# Patient Record
Sex: Female | Born: 1945 | Race: White | Hispanic: No | Marital: Married | Smoking: Former smoker
Health system: Southern US, Community
[De-identification: ages and names within clinical notes are randomized; demographics above are authoritative.]

## PROBLEM LIST (undated history)

## (undated) DIAGNOSIS — E079 Disorder of thyroid, unspecified: Secondary | ICD-10-CM

## (undated) DIAGNOSIS — E785 Hyperlipidemia, unspecified: Secondary | ICD-10-CM

## (undated) DIAGNOSIS — M8580 Other specified disorders of bone density and structure, unspecified site: Secondary | ICD-10-CM

## (undated) DIAGNOSIS — R011 Cardiac murmur, unspecified: Secondary | ICD-10-CM

## (undated) DIAGNOSIS — E039 Hypothyroidism, unspecified: Secondary | ICD-10-CM

## (undated) DIAGNOSIS — E119 Type 2 diabetes mellitus without complications: Secondary | ICD-10-CM

## (undated) DIAGNOSIS — I10 Essential (primary) hypertension: Secondary | ICD-10-CM

## (undated) DIAGNOSIS — E669 Obesity, unspecified: Secondary | ICD-10-CM

## (undated) DIAGNOSIS — I639 Cerebral infarction, unspecified: Secondary | ICD-10-CM

## (undated) DIAGNOSIS — L900 Lichen sclerosus et atrophicus: Secondary | ICD-10-CM

## (undated) HISTORY — DX: Lichen sclerosus et atrophicus: L90.0

## (undated) HISTORY — DX: Cerebral infarction, unspecified: I63.9

## (undated) HISTORY — DX: Disorder of thyroid, unspecified: E07.9

## (undated) HISTORY — DX: Other specified disorders of bone density and structure, unspecified site: M85.80

## (undated) HISTORY — DX: Essential (primary) hypertension: I10

## (undated) HISTORY — DX: Cardiac murmur, unspecified: R01.1

## (undated) HISTORY — PX: ROTATOR CUFF REPAIR: SHX139

## (undated) HISTORY — DX: Type 2 diabetes mellitus without complications: E11.9

## (undated) HISTORY — DX: Obesity, unspecified: E66.9

## (undated) HISTORY — DX: Hyperlipidemia, unspecified: E78.5

## (undated) HISTORY — PX: OTHER SURGICAL HISTORY: SHX169

---

## 2006-02-20 ENCOUNTER — Ambulatory Visit: Payer: Self-pay | Admitting: Unknown Physician Specialty

## 2006-03-07 ENCOUNTER — Other Ambulatory Visit: Payer: Self-pay

## 2006-03-07 ENCOUNTER — Inpatient Hospital Stay: Payer: Self-pay | Admitting: Internal Medicine

## 2006-03-17 ENCOUNTER — Ambulatory Visit: Payer: Self-pay | Admitting: Internal Medicine

## 2006-05-14 ENCOUNTER — Ambulatory Visit: Payer: Self-pay | Admitting: Unknown Physician Specialty

## 2008-10-24 ENCOUNTER — Ambulatory Visit: Payer: Self-pay | Admitting: Family Medicine

## 2010-10-05 LAB — HM PAP SMEAR

## 2010-11-08 ENCOUNTER — Ambulatory Visit: Payer: Self-pay

## 2011-12-31 ENCOUNTER — Ambulatory Visit: Payer: Self-pay | Admitting: Unknown Physician Specialty

## 2012-06-11 ENCOUNTER — Ambulatory Visit: Payer: Self-pay

## 2013-07-22 ENCOUNTER — Ambulatory Visit: Payer: Self-pay

## 2014-03-10 DIAGNOSIS — D649 Anemia, unspecified: Secondary | ICD-10-CM | POA: Insufficient documentation

## 2014-04-01 ENCOUNTER — Ambulatory Visit: Payer: Self-pay | Admitting: Gastroenterology

## 2014-04-01 LAB — HM COLONOSCOPY

## 2014-04-11 ENCOUNTER — Ambulatory Visit: Payer: Self-pay | Admitting: Gastroenterology

## 2014-10-25 ENCOUNTER — Ambulatory Visit: Payer: Self-pay

## 2014-10-25 LAB — HM MAMMOGRAPHY

## 2015-02-23 ENCOUNTER — Encounter: Payer: Self-pay | Admitting: Unknown Physician Specialty

## 2015-02-23 LAB — HM DIABETES EYE EXAM

## 2015-04-03 ENCOUNTER — Encounter: Payer: Self-pay | Admitting: Unknown Physician Specialty

## 2015-04-03 ENCOUNTER — Ambulatory Visit (INDEPENDENT_AMBULATORY_CARE_PROVIDER_SITE_OTHER): Payer: PPO | Admitting: Unknown Physician Specialty

## 2015-04-03 VITALS — BP 127/59 | HR 60 | Temp 97.4°F | Ht 61.6 in | Wt 174.0 lb

## 2015-04-03 DIAGNOSIS — E1165 Type 2 diabetes mellitus with hyperglycemia: Secondary | ICD-10-CM | POA: Diagnosis not present

## 2015-04-03 DIAGNOSIS — E039 Hypothyroidism, unspecified: Secondary | ICD-10-CM | POA: Insufficient documentation

## 2015-04-03 DIAGNOSIS — I639 Cerebral infarction, unspecified: Secondary | ICD-10-CM | POA: Insufficient documentation

## 2015-04-03 DIAGNOSIS — L900 Lichen sclerosus et atrophicus: Secondary | ICD-10-CM | POA: Insufficient documentation

## 2015-04-03 DIAGNOSIS — E119 Type 2 diabetes mellitus without complications: Secondary | ICD-10-CM

## 2015-04-03 DIAGNOSIS — IMO0002 Reserved for concepts with insufficient information to code with codable children: Secondary | ICD-10-CM | POA: Insufficient documentation

## 2015-04-03 DIAGNOSIS — I129 Hypertensive chronic kidney disease with stage 1 through stage 4 chronic kidney disease, or unspecified chronic kidney disease: Secondary | ICD-10-CM | POA: Insufficient documentation

## 2015-04-03 DIAGNOSIS — I10 Essential (primary) hypertension: Secondary | ICD-10-CM | POA: Insufficient documentation

## 2015-04-03 DIAGNOSIS — E538 Deficiency of other specified B group vitamins: Secondary | ICD-10-CM | POA: Insufficient documentation

## 2015-04-03 DIAGNOSIS — E669 Obesity, unspecified: Secondary | ICD-10-CM | POA: Insufficient documentation

## 2015-04-03 DIAGNOSIS — N183 Chronic kidney disease, stage 3 unspecified: Secondary | ICD-10-CM | POA: Insufficient documentation

## 2015-04-03 DIAGNOSIS — M8580 Other specified disorders of bone density and structure, unspecified site: Secondary | ICD-10-CM | POA: Insufficient documentation

## 2015-04-03 DIAGNOSIS — E785 Hyperlipidemia, unspecified: Secondary | ICD-10-CM | POA: Insufficient documentation

## 2015-04-03 LAB — BAYER DCA HB A1C WAIVED: HB A1C: 6.9 % (ref <7.0)

## 2015-04-03 MED ORDER — METFORMIN HCL ER 500 MG PO TB24: 2000.0000 mg | ORAL_TABLET | Freq: Every day | ORAL | Status: DC

## 2015-04-03 NOTE — Progress Notes (Signed)
BP 127/59 mmHg  Pulse 60  Temp(Src) 97.4 F (36.3 C)  Ht 5' 1.6" (1.565 m)  Wt 174 lb (78.926 kg)  BMI 32.22 kg/m2  SpO2 100%  LMP  (LMP Unknown)   Subjective:    Patient ID: Teresa [name], female    DOB: 1945-11-14, 69 y.o.   MRN: [id number]  HPI: Teresa [name] is a 69 y.o. female  Chief Complaint  Patient presents with  . Hyperglycemia    pt states sugar has been runnig high for about a month and a half, checks every day, lowest was 170 and highest was 230 something patient states   Diabetes For the last month and a half is finding higher blood sugars.  Thinks it is ranging around 200.  Checks it  Couple of times during the day.  Taking 1,000 mg of Metformin every day.  Doesn't exercise as she doesn't feel ike it  Noticing her hair falling out.  Admits to a lot of stress at this time.  3 months ago sugar was under good control   Relevant past medical, surgical, family and social history reviewed and updated as indicated. Interim medical history since our last visit reviewed. Allergies and medications reviewed and updated.  Review of Systems  Per HPI unless specifically indicated above     Objective:    BP 127/59 mmHg  Pulse 60  Temp(Src) 97.4 F (36.3 C)  Ht 5' 1.6" (1.565 m)  Wt 174 lb (78.926 kg)  BMI 32.22 kg/m2  SpO2 100%  LMP  (LMP Unknown)  Wt Readings from Last 3 Encounters:  04/03/15 174 lb (78.926 kg)  01/02/15 173 lb (78.472 kg)    Physical Exam  Constitutional: She is oriented to person, place, and time. She appears well-developed and well-nourished. No distress.  HENT:  Head: Normocephalic and atraumatic.  Eyes: Conjunctivae and lids are normal. Right eye exhibits no discharge. Left eye exhibits no discharge. No scleral icterus.  Cardiovascular: Normal rate, regular rhythm and normal heart sounds.   Pulmonary/Chest: Effort normal and breath sounds normal. No respiratory distress.  Abdominal: Normal appearance and bowel sounds are normal.  She exhibits no distension. There is no splenomegaly or hepatomegaly. There is no tenderness.  Musculoskeletal: Normal range of motion.  Neurological: She is alert and oriented to person, place, and time.  Skin: Skin is intact. No rash noted. No pallor.  Psychiatric: She has a normal mood and affect. Her behavior is normal. Judgment and thought content normal.    Results for orders placed or performed in visit on 04/03/15  HM MAMMOGRAPHY  Result Value Ref Range   HM Mammogram from PP   HM PAP SMEAR  Result Value Ref Range   HM Pap smear from PP   HM COLONOSCOPY  Result Value Ref Range   HM Colonoscopy from PP       Assessment & Plan:   Problem List Items Addressed This Visit      Unprioritized   Hypothyroidism    Check TSH to make sure that is not effecting her blood sugar.        Diabetes type 2, uncontrolled - Primary    Hgb A1C is 6.9 but is a 3 month not a 1 month measure.  Increse Metformin to 2000 mgs due to hight blood sugars in the last month      Relevant Medications   metFORMIN (GLUCOPHAGE-XR) 500 MG 24 hr tablet   Other Relevant Orders   TSH   Bayer  DCA Hb A1c Waived   Comprehensive metabolic panel       Follow up plan: Return for In 3 mnths for routine visit.

## 2015-04-03 NOTE — Assessment & Plan Note (Signed)
Check TSH to make sure that is not effecting her blood sugar.

## 2015-04-03 NOTE — Assessment & Plan Note (Signed)
Hgb A1C is 6.9 but is a 3 month not a 1 month measure.  Increse Metformin to 2000 mgs due to hight blood sugars in the last month

## 2015-04-04 LAB — COMPREHENSIVE METABOLIC PANEL
A/G RATIO: 1.7 (ref 1.1–2.5)
ALBUMIN: 4.1 g/dL (ref 3.6–4.8)
ALT: 18 IU/L (ref 0–32)
AST: 15 IU/L (ref 0–40)
Alkaline Phosphatase: 87 IU/L (ref 39–117)
BUN/Creatinine Ratio: 24 (ref 11–26)
BUN: 25 mg/dL (ref 8–27)
Bilirubin Total: <0.2 mg/dL (ref 0.0–1.2)
CALCIUM: 9.2 mg/dL (ref 8.7–10.3)
CHLORIDE: 103 mmol/L (ref 97–108)
CO2: 23 mmol/L (ref 18–29)
Creatinine, Ser: 1.06 mg/dL — ABNORMAL HIGH (ref 0.57–1.00)
GFR calc Af Amer: 62 mL/min/{1.73_m2} (ref >59)
GFR calc non Af Amer: 54 mL/min/{1.73_m2} — ABNORMAL LOW (ref >59)
Globulin, Total: 2.4 g/dL (ref 1.5–4.5)
Glucose: 120 mg/dL — ABNORMAL HIGH (ref 65–99)
Potassium: 4.6 mmol/L (ref 3.5–5.2)
SODIUM: 143 mmol/L (ref 134–144)
TOTAL PROTEIN: 6.5 g/dL (ref 6.0–8.5)

## 2015-04-04 LAB — TSH: TSH: 1.72 u[IU]/mL (ref 0.450–4.500)

## 2015-04-05 ENCOUNTER — Telehealth: Payer: Self-pay

## 2015-04-05 NOTE — Telephone Encounter (Signed)
Called and let patient know that rx was called into the pharmacy that she provided me the number to. Also called to discontinue the rx sent to the wrong pharmacy but they stated they did not fill it anyway.

## 2015-04-05 NOTE — Telephone Encounter (Signed)
Patient called and stated that the rx Malachy Mood prescribed Monday had not gotten to the pharmacy. Patient stated that the pharmacy name she gave me on Monday changed there name and she did not know. She provided me with their number and I called the rx in to the right pharmacy. Will call the pharmacy she gave me and cancel the rx sent to them.

## 2015-05-02 ENCOUNTER — Other Ambulatory Visit: Payer: Self-pay | Admitting: Unknown Physician Specialty

## 2015-06-26 ENCOUNTER — Ambulatory Visit (INDEPENDENT_AMBULATORY_CARE_PROVIDER_SITE_OTHER): Payer: PPO | Admitting: Unknown Physician Specialty

## 2015-06-26 ENCOUNTER — Encounter: Payer: Self-pay | Admitting: Unknown Physician Specialty

## 2015-06-26 VITALS — BP 111/73 | HR 57 | Temp 97.3°F | Ht 61.1 in | Wt 172.0 lb

## 2015-06-26 DIAGNOSIS — J0100 Acute maxillary sinusitis, unspecified: Secondary | ICD-10-CM

## 2015-06-26 MED ORDER — HYDROCOD POLST-CPM POLST ER 10-8 MG/5ML PO SUER: 5.0000 mL | Freq: Two times a day (BID) | ORAL | Status: DC | PRN

## 2015-06-26 MED ORDER — AZITHROMYCIN 250 MG PO TABS: ORAL_TABLET | ORAL | Status: DC

## 2015-06-26 NOTE — Progress Notes (Signed)
BP 111/73 mmHg  Pulse 57  Temp(Src) 97.3 F (36.3 C)  Ht 5' 1.1" (1.552 m)  Wt 172 lb (78.019 kg)  BMI 32.39 kg/m2  SpO2 99%  LMP  (LMP Unknown)   Subjective:    Patient ID: Teresa [name], female    DOB: 1945/08/16, 69 y.o.   MRN: [id number]  HPI: PERLE FISHMAN is a 69 y.o. female  Chief Complaint  Patient presents with  . Cough    pt states she has had a cough, headache, and runny nose. States symptoms started 2 weeks ago. Wants to know if it is okay to get a flu shot   Cough This is a new problem. The current episode started 1 to 4 weeks ago. The problem has been unchanged. The problem occurs constantly. The cough is non-productive. Associated symptoms include headaches, rhinorrhea and a sore throat. Pertinent negatives include no chest pain, chills, ear congestion, ear pain, fever or myalgias. The symptoms are aggravated by lying down. She has tried OTC cough suppressant for the symptoms. The treatment provided no relief.     Relevant past medical, surgical, family and social history reviewed and updated as indicated. Interim medical history since our last visit reviewed. Allergies and medications reviewed and updated.  Review of Systems  Constitutional: Negative for fever and chills.  HENT: Positive for rhinorrhea and sore throat. Negative for ear pain.   Respiratory: Positive for cough.   Cardiovascular: Negative for chest pain.  Musculoskeletal: Negative for myalgias.  Neurological: Positive for headaches.    Per HPI unless specifically indicated above     Objective:    BP 111/73 mmHg  Pulse 57  Temp(Src) 97.3 F (36.3 C)  Ht 5' 1.1" (1.552 m)  Wt 172 lb (78.019 kg)  BMI 32.39 kg/m2  SpO2 99%  LMP  (LMP Unknown)  Wt Readings from Last 3 Encounters:  06/26/15 172 lb (78.019 kg)  04/03/15 174 lb (78.926 kg)  01/02/15 173 lb (78.472 kg)    Physical Exam  Constitutional: She is oriented to person, place, and time. She appears well-developed and  well-nourished. No distress.  HENT:  Head: Normocephalic and atraumatic.  Right Ear: Tympanic membrane and ear canal normal.  Left Ear: Tympanic membrane and ear canal normal.  Nose: No rhinorrhea. Right sinus exhibits maxillary sinus tenderness. Right sinus exhibits no frontal sinus tenderness. Left sinus exhibits maxillary sinus tenderness. Left sinus exhibits no frontal sinus tenderness.  Eyes: Conjunctivae and lids are normal. Right eye exhibits no discharge. Left eye exhibits no discharge. No scleral icterus.  Cardiovascular: Normal rate and regular rhythm.   Pulmonary/Chest: Effort normal and breath sounds normal. No respiratory distress.  Abdominal: Normal appearance. There is no splenomegaly or hepatomegaly.  Musculoskeletal: Normal range of motion.  Neurological: She is alert and oriented to person, place, and time.  Skin: Skin is intact. No rash noted. No pallor.  Psychiatric: She has a normal mood and affect. Her behavior is normal. Judgment and thought content normal.       Assessment & Plan:   Problem List Items Addressed This Visit    None    Visit Diagnoses    Acute maxillary sinusitis, recurrence not specified    -  Primary    Relevant Medications    azithromycin (ZITHROMAX) 250 MG tablet    chlorpheniramine-HYDROcodone (TUSSIONEX PENNKINETIC ER) 10-8 MG/5ML SUER        Follow up plan: Return for Following up later this month.

## 2015-07-04 ENCOUNTER — Ambulatory Visit (INDEPENDENT_AMBULATORY_CARE_PROVIDER_SITE_OTHER): Payer: PPO | Admitting: Unknown Physician Specialty

## 2015-07-04 ENCOUNTER — Encounter: Payer: Self-pay | Admitting: Unknown Physician Specialty

## 2015-07-04 VITALS — BP 112/74 | HR 54 | Temp 97.7°F | Ht 61.2 in | Wt 172.0 lb

## 2015-07-04 DIAGNOSIS — N183 Chronic kidney disease, stage 3 unspecified: Secondary | ICD-10-CM

## 2015-07-04 DIAGNOSIS — E785 Hyperlipidemia, unspecified: Secondary | ICD-10-CM

## 2015-07-04 DIAGNOSIS — Z23 Encounter for immunization: Secondary | ICD-10-CM | POA: Diagnosis not present

## 2015-07-04 DIAGNOSIS — I129 Hypertensive chronic kidney disease with stage 1 through stage 4 chronic kidney disease, or unspecified chronic kidney disease: Secondary | ICD-10-CM

## 2015-07-04 DIAGNOSIS — E1122 Type 2 diabetes mellitus with diabetic chronic kidney disease: Secondary | ICD-10-CM | POA: Diagnosis not present

## 2015-07-04 LAB — LIPID PANEL PICCOLO, WAIVED
CHOLESTEROL PICCOLO, WAIVED: 147 mg/dL (ref <200)
Chol/HDL Ratio Piccolo,Waive: 2.6 mg/dL
HDL CHOL PICCOLO, WAIVED: 56 mg/dL — AB (ref >59)
LDL Chol Calc Piccolo Waived: 73 mg/dL (ref <100)
Triglycerides Piccolo,Waived: 89 mg/dL (ref <150)
VLDL Chol Calc Piccolo,Waive: 18 mg/dL (ref <30)

## 2015-07-04 LAB — BAYER DCA HB A1C WAIVED: HB A1C: 6.7 % (ref <7.0)

## 2015-07-04 LAB — MICROALBUMIN, URINE WAIVED
CREATININE, URINE WAIVED: 300 mg/dL (ref 10–300)
Microalb, Ur Waived: 30 mg/L — ABNORMAL HIGH (ref 0–19)

## 2015-07-04 NOTE — Progress Notes (Signed)
BP 112/74 mmHg  Pulse 54  Temp(Src) 97.7 F (36.5 C)  Ht 5' 1.2" (1.554 m)  Wt 172 lb (78.019 kg)  BMI 32.31 kg/m2  SpO2 99%  LMP  (LMP Unknown)   Subjective:    Patient ID: Teresa [name], female    DOB: 29-May-1946, 69 y.o.   MRN: [id number]  HPI: Teresa [name] is a 69 y.o. female  Chief Complaint  Patient presents with  . Hypertension  . Diabetes  . Hyperlipidemia   Diabetes:  Using medications without difficulties No hypoglycemic episodes No hyperglycemic episodes Feet problems: none Blood Sugars averaging: highest is 120 but usually in the "one teens" eye exam within last year  Hypertension  Using medications without difficulty Average home BPs: not checking   Using medication without problems or lightheadedness No chest pain with exertion or shortness of breath No Edema  Elevated Cholesterol: Using medications without problems No Muscle aches  Diet compliance: good Exercise: walking   Relevant past medical, surgical, family and social history reviewed and updated as indicated. Interim medical history since our last visit reviewed. Allergies and medications reviewed and updated.  Review of Systems  Constitutional: Negative.   HENT: Negative.   Respiratory: Negative.        Cough she was seen for 2 weeks ago is better but still has cold symptoms  Endocrine: Negative.   Neurological: Negative.   Psychiatric/Behavioral: Negative.     Per HPI unless specifically indicated above     Objective:    BP 112/74 mmHg  Pulse 54  Temp(Src) 97.7 F (36.5 C)  Ht 5' 1.2" (1.554 m)  Wt 172 lb (78.019 kg)  BMI 32.31 kg/m2  SpO2 99%  LMP  (LMP Unknown)  Wt Readings from Last 3 Encounters:  07/04/15 172 lb (78.019 kg)  06/26/15 172 lb (78.019 kg)  04/03/15 174 lb (78.926 kg)    Physical Exam  Constitutional: She is oriented to person, place, and time. She appears well-developed and well-nourished. No distress.  HENT:  Head: Normocephalic and  atraumatic.  Eyes: Conjunctivae and lids are normal. Right eye exhibits no discharge. Left eye exhibits no discharge. No scleral icterus.  Neck: Normal carotid pulses present. Carotid bruit is not present. No tracheal deviation present. No thyromegaly present.  Cardiovascular: Normal rate, regular rhythm and normal heart sounds.   Pulmonary/Chest: Effort normal and breath sounds normal. No respiratory distress.  Abdominal: Normal appearance. There is no splenomegaly or hepatomegaly.  Musculoskeletal: Normal range of motion.  Neurological: She is alert and oriented to person, place, and time.  Skin: Skin is warm, dry and intact. No rash noted. No pallor.  Psychiatric: She has a normal mood and affect. Her behavior is normal. Judgment and thought content normal.   Diabetic Foot Exam - Simple   Simple Foot Form  Diabetic Foot exam was performed with the following findings:  Yes 07/04/2015  8:22 AM  Visual Inspection  No deformities, no ulcerations, no other skin breakdown bilaterally:  Yes  Sensation Testing  Intact to touch and monofilament testing bilaterally:  Yes  Pulse Check  Posterior Tibialis and Dorsalis pulse intact bilaterally:  Yes  Comments       Results for orders placed or performed in visit on 04/03/15  TSH  Result Value Ref Range   TSH 1.720 0.450 - 4.500 uIU/mL  Bayer DCA Hb A1c Waived  Result Value Ref Range   Bayer DCA Hb A1c Waived 6.9 <7.0 %  Comprehensive metabolic panel  Result Value Ref Range   Glucose 120 (H) 65 - 99 mg/dL   BUN 25 8 - 27 mg/dL   Creatinine, Ser 1.06 (H) 0.57 - 1.00 mg/dL   GFR calc non Af Amer 54 (L) >59 mL/min/1.73   GFR calc Af Amer 62 >59 mL/min/1.73   BUN/Creatinine Ratio 24 11 - 26   Sodium 143 134 - 144 mmol/L   Potassium 4.6 3.5 - 5.2 mmol/L   Chloride 103 97 - 108 mmol/L   CO2 23 18 - 29 mmol/L   Calcium 9.2 8.7 - 10.3 mg/dL   Total Protein 6.5 6.0 - 8.5 g/dL   Albumin 4.1 3.6 - 4.8 g/dL   Globulin, Total 2.4 1.5 - 4.5  g/dL   Albumin/Globulin Ratio 1.7 1.1 - 2.5   Bilirubin Total <0.2 0.0 - 1.2 mg/dL   Alkaline Phosphatase 87 39 - 117 IU/L   AST 15 0 - 40 IU/L   ALT 18 0 - 32 IU/L      Assessment & Plan:   Problem List Items Addressed This Visit      Unprioritized   Hyperlipidemia    Reviewed lipid panel.  LDL was 73.  Continue present medications.         Relevant Orders   Lipid Panel Piccolo, Waived   Hypertensive CKD (chronic kidney disease)    Stable, continue present medications.       Relevant Orders   Microalbumin, Urine Waived   Uric acid   Type 2 diabetes mellitus with stage 3 chronic kidney disease, without long-term current use of insulin (HCC) - Primary    Hgb A1C is 6.7.  Continue present medications      Relevant Orders   Comprehensive metabolic panel   Bayer DCA Hb A1c Waived   Microalbumin, Urine Waived    Other Visit Diagnoses    Immunization due        Relevant Orders    Flu Vaccine QUAD 36+ mos IM (Completed)        Follow up plan: Return in about 6 months (around 01/01/2016) for physical.

## 2015-07-04 NOTE — Assessment & Plan Note (Signed)
Reviewed lipid panel.  LDL was 73.  Continue present medications.

## 2015-07-04 NOTE — Assessment & Plan Note (Signed)
Stable, continue present medications.   

## 2015-07-04 NOTE — Assessment & Plan Note (Signed)
Hgb A1C is 6.7.  Continue present medications

## 2015-07-05 LAB — URIC ACID: URIC ACID: 6.7 mg/dL (ref 2.5–7.1)

## 2015-07-05 LAB — COMPREHENSIVE METABOLIC PANEL
A/G RATIO: 1.6 (ref 1.1–2.5)
ALBUMIN: 4.1 g/dL (ref 3.6–4.8)
ALT: 17 IU/L (ref 0–32)
AST: 16 IU/L (ref 0–40)
Alkaline Phosphatase: 89 IU/L (ref 39–117)
BUN / CREAT RATIO: 24 (ref 11–26)
BUN: 25 mg/dL (ref 8–27)
Bilirubin Total: 0.2 mg/dL (ref 0.0–1.2)
CALCIUM: 9.5 mg/dL (ref 8.7–10.3)
CO2: 23 mmol/L (ref 18–29)
CREATININE: 1.05 mg/dL — AB (ref 0.57–1.00)
Chloride: 102 mmol/L (ref 97–106)
GFR, EST AFRICAN AMERICAN: 63 mL/min/{1.73_m2} (ref >59)
GFR, EST NON AFRICAN AMERICAN: 55 mL/min/{1.73_m2} — AB (ref >59)
GLOBULIN, TOTAL: 2.6 g/dL (ref 1.5–4.5)
Glucose: 121 mg/dL — ABNORMAL HIGH (ref 65–99)
POTASSIUM: 4.8 mmol/L (ref 3.5–5.2)
SODIUM: 142 mmol/L (ref 136–144)
Total Protein: 6.7 g/dL (ref 6.0–8.5)

## 2015-07-25 ENCOUNTER — Other Ambulatory Visit: Payer: Self-pay | Admitting: Unknown Physician Specialty

## 2015-07-31 ENCOUNTER — Other Ambulatory Visit: Payer: Self-pay | Admitting: Unknown Physician Specialty

## 2015-10-30 ENCOUNTER — Other Ambulatory Visit: Payer: Self-pay | Admitting: Family Medicine

## 2016-01-03 ENCOUNTER — Ambulatory Visit (INDEPENDENT_AMBULATORY_CARE_PROVIDER_SITE_OTHER): Payer: PPO | Admitting: Unknown Physician Specialty

## 2016-01-03 ENCOUNTER — Encounter: Payer: Self-pay | Admitting: Unknown Physician Specialty

## 2016-01-03 VITALS — BP 125/74 | HR 57 | Temp 98.0°F | Ht 61.0 in | Wt 169.8 lb

## 2016-01-03 DIAGNOSIS — Z780 Asymptomatic menopausal state: Secondary | ICD-10-CM | POA: Diagnosis not present

## 2016-01-03 DIAGNOSIS — Z0000 Encounter for general adult medical examination without abnormal findings: Secondary | ICD-10-CM | POA: Diagnosis not present

## 2016-01-03 DIAGNOSIS — I10 Essential (primary) hypertension: Secondary | ICD-10-CM | POA: Diagnosis not present

## 2016-01-03 DIAGNOSIS — N183 Chronic kidney disease, stage 3 (moderate): Secondary | ICD-10-CM

## 2016-01-03 DIAGNOSIS — E1122 Type 2 diabetes mellitus with diabetic chronic kidney disease: Secondary | ICD-10-CM | POA: Diagnosis not present

## 2016-01-03 DIAGNOSIS — E785 Hyperlipidemia, unspecified: Secondary | ICD-10-CM

## 2016-01-03 LAB — MICROALBUMIN, URINE WAIVED
CREATININE, URINE WAIVED: 200 mg/dL (ref 10–300)
MICROALB, UR WAIVED: 30 mg/L — AB (ref 0–19)

## 2016-01-03 LAB — BAYER DCA HB A1C WAIVED: HB A1C: 6.7 % (ref <7.0)

## 2016-01-03 NOTE — Assessment & Plan Note (Signed)
Stable, continue present medications.   

## 2016-01-03 NOTE — Patient Instructions (Signed)
Please do call to schedule your bone density; the number to schedule one at either [facility] or [facility] is [phone]   

## 2016-01-03 NOTE — Progress Notes (Signed)
BP 125/74 mmHg  Pulse 57  Temp(Src) 98 F (36.7 C)  Ht 5\' 1"  (1.549 m)  Wt 169 lb 12.8 oz (77.021 kg)  BMI 32.10 kg/m2  SpO2 98%  LMP  (LMP Unknown)   Subjective:    Patient ID: Teresa [name], female    DOB: 11-Nov-1945, 70 y.o.   MRN: [id number]  HPI: Teresa [name] is a 70 y.o. female  Chief Complaint  Patient presents with  . Medicare Wellness    Hep C ordered   Social History   Social History  . Marital Status: Married    Spouse Name: N/A  . Number of Children: N/A  . Years of Education: N/A   Occupational History  . Not on file.   Social History Main Topics  . Smoking status: Former Research scientist (life sciences)  . Smokeless tobacco: Never Used  . Alcohol Use: No  . Drug Use: No  . Sexual Activity: No   Other Topics Concern  . Not on file   Social History Narrative   Family History  Problem Relation Age of Onset  . Hypertension Mother   . Heart disease Mother   . Thyroid disease Mother   . Glaucoma Mother   . Stroke Father   . Diabetes Brother   . Hypertension Maternal Grandfather    Past Medical History  Diagnosis Date  . Stroke ([place])   . Hypertension   . Obesity   . Lichen sclerosus   . Thyroid disease   . Osteopenia   . Diabetes mellitus without complication ([place])   . Hyperlipidemia    Past Surgical History  Procedure Laterality Date  . Rotator cuff repair Left   . 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 Yes No  Number falls in  past yr: 1 -  Injury with Fall? Yes -   See advanced directives at the appropriate section of the chart.    Diabetes: Using medications without difficulties No hypoglycemic episodes No hyperglycemic episodes Feet problems: none Blood Sugars averaging: 110-120 eye exam within last year: Due and will make own appt Last Hgb A1C: 6.7  Hypertension  Using medications without difficulty Average home BPs Not checking   Using medication without problems or lightheadedness No chest pain with exertion or shortness of breath No Edema  Elevated Cholesterol Using medications without problems No Muscle aches  Diet: Not very well Exercise: Not exercising   Relevant past medical, surgical, family and social history reviewed and updated as indicated. Interim medical history since our last visit reviewed. Allergies and medications reviewed and updated.  Review of Systems  Constitutional: Negative.   HENT: Negative.   Eyes: Negative.   Respiratory: Negative.   Cardiovascular: Negative.   Gastrointestinal: Negative.   Endocrine: Negative.   Genitourinary: Positive for frequency.       Having frequency which is a baseline  Musculoskeletal: Negative.   Skin: Negative.   Allergic/Immunologic: Negative.   Neurological: Negative.   Hematological: Negative.   Psychiatric/Behavioral: Negative.  Per HPI unless specifically indicated above     Objective:    BP 125/74 mmHg  Pulse 57  Temp(Src) 98 F (36.7 C)  Ht 5\' 1"  (1.549 m)  Wt 169 lb 12.8 oz (77.021 kg)  BMI 32.10 kg/m2  SpO2 98%  LMP  (LMP Unknown)  Wt Readings from Last 3 Encounters:  01/03/16 169 lb 12.8 oz (77.021 kg)  07/04/15 172 lb (78.019 kg)  06/26/15 172 lb (78.019 kg)    Physical Exam  Constitutional: She is oriented to person, place, and time. She appears well-developed and well-nourished.  HENT:  Head: Normocephalic and atraumatic.  Eyes: Pupils are equal, round, and reactive to light. Right eye exhibits  no discharge. Left eye exhibits no discharge. No scleral icterus.  Neck: Normal range of motion. Neck supple. Carotid bruit is not present. No thyromegaly present.  Cardiovascular: Normal rate, regular rhythm and normal heart sounds.  Exam reveals no gallop and no friction rub.   No murmur heard. Pulmonary/Chest: Effort normal and breath sounds normal. No respiratory distress. She has no wheezes. She has no rales.  Abdominal: Soft. Bowel sounds are normal. There is no tenderness. There is no rebound.  Genitourinary: No breast swelling, tenderness or discharge.  Musculoskeletal: Normal range of motion.  Lymphadenopathy:    She has no cervical adenopathy.  Neurological: She is alert and oriented to person, place, and time.  Skin: Skin is warm, dry and intact. No rash noted.  Psychiatric: She has a normal mood and affect. Her speech is normal and behavior is normal. Judgment and thought content normal. Cognition and memory are normal.    Results for orders placed or performed in visit on 07/04/15  Comprehensive metabolic panel  Result Value Ref Range   Glucose 121 (H) 65 - 99 mg/dL   BUN 25 8 - 27 mg/dL   Creatinine, Ser 1.05 (H) 0.57 - 1.00 mg/dL   GFR calc non Af Amer 55 (L) >59 mL/min/1.73   GFR calc Af Amer 63 >59 mL/min/1.73   BUN/Creatinine Ratio 24 11 - 26   Sodium 142 136 - 144 mmol/L   Potassium 4.8 3.5 - 5.2 mmol/L   Chloride 102 97 - 106 mmol/L   CO2 23 18 - 29 mmol/L   Calcium 9.5 8.7 - 10.3 mg/dL   Total Protein 6.7 6.0 - 8.5 g/dL   Albumin 4.1 3.6 - 4.8 g/dL   Globulin, Total 2.6 1.5 - 4.5 g/dL   Albumin/Globulin Ratio 1.6 1.1 - 2.5   Bilirubin Total 0.2 0.0 - 1.2 mg/dL   Alkaline Phosphatase 89 39 - 117 IU/L   AST 16 0 - 40 IU/L   ALT 17 0 - 32 IU/L  Bayer DCA Hb A1c Waived  Result Value Ref Range   Bayer DCA Hb A1c Waived 6.7 <7.0 %  Lipid Panel Piccolo, Waived  Result Value Ref Range   Cholesterol Piccolo, Waived 147 <200 mg/dL   HDL Chol Piccolo, Waived 56  (L) >59 mg/dL   Triglycerides Piccolo,Waived 89 <150 mg/dL   Chol/HDL Ratio Piccolo,Waive 2.6 mg/dL   LDL Chol Calc Piccolo Waived 73 <100 mg/dL   VLDL Chol Calc Piccolo,Waive 18 <30 mg/dL  Microalbumin, Urine Waived  Result Value Ref Range   Microalb, Ur Waived 30 (H) 0 - 19 mg/L   Creatinine, Urine Waived 300 10 - 300 mg/dL   Microalb/Creat Ratio <30 <30 mg/g  Uric acid  Result Value Ref Range   Uric Acid 6.7 2.5 - 7.1 mg/dL  Assessment & Plan:   Problem List Items Addressed This Visit      Unprioritized   Hyperlipidemia   Relevant Orders   Lipid Panel w/o Chol/HDL Ratio   Hypertension    Stable, continue present medications.        Relevant Orders   Comprehensive metabolic panel   Microalbumin, Urine Waived   Type 2 diabetes mellitus with stage 3 chronic kidney disease, without long-term current use of insulin (HCC)    Hgb A1C is 6.7.  Continue present medications      Relevant Orders   Comprehensive metabolic panel   Bayer DCA Hb A1c Waived   CBC with Differential/Platelet    Other Visit Diagnoses    Health care maintenance    -  Primary    Relevant Orders    Hepatitis C antibody    Menopause        Relevant Orders    DG Bone Density        Follow up plan: Return in about 6 months (around 07/05/2016).

## 2016-01-03 NOTE — Assessment & Plan Note (Signed)
Hgb A1C is 6.7.  Continue present medications

## 2016-01-04 LAB — CBC WITH DIFFERENTIAL/PLATELET
BASOS ABS: 0 10*3/uL (ref 0.0–0.2)
Basos: 0 %
EOS (ABSOLUTE): 0.1 10*3/uL (ref 0.0–0.4)
Eos: 1 %
Hematocrit: 32.4 % — ABNORMAL LOW (ref 34.0–46.6)
Hemoglobin: 11.1 g/dL (ref 11.1–15.9)
IMMATURE GRANULOCYTES: 0 %
Immature Grans (Abs): 0 10*3/uL (ref 0.0–0.1)
LYMPHS ABS: 2.2 10*3/uL (ref 0.7–3.1)
Lymphs: 29 %
MCH: 30.9 pg (ref 26.6–33.0)
MCHC: 34.3 g/dL (ref 31.5–35.7)
MCV: 90 fL (ref 79–97)
MONOS ABS: 0.6 10*3/uL (ref 0.1–0.9)
Monocytes: 7 %
NEUTROS PCT: 63 %
Neutrophils Absolute: 4.7 10*3/uL (ref 1.4–7.0)
PLATELETS: 275 10*3/uL (ref 150–379)
RBC: 3.59 x10E6/uL — AB (ref 3.77–5.28)
RDW: 13.3 % (ref 12.3–15.4)
WBC: 7.6 10*3/uL (ref 3.4–10.8)

## 2016-01-04 LAB — COMPREHENSIVE METABOLIC PANEL
ALBUMIN: 4.3 g/dL (ref 3.6–4.8)
ALK PHOS: 96 IU/L (ref 39–117)
ALT: 21 IU/L (ref 0–32)
AST: 19 IU/L (ref 0–40)
Albumin/Globulin Ratio: 1.9 (ref 1.2–2.2)
BILIRUBIN TOTAL: 0.3 mg/dL (ref 0.0–1.2)
BUN / CREAT RATIO: 21 (ref 12–28)
BUN: 18 mg/dL (ref 8–27)
CHLORIDE: 101 mmol/L (ref 96–106)
CO2: 23 mmol/L (ref 18–29)
CREATININE: 0.85 mg/dL (ref 0.57–1.00)
Calcium: 9.6 mg/dL (ref 8.7–10.3)
GFR calc Af Amer: 81 mL/min/{1.73_m2} (ref >59)
GFR calc non Af Amer: 70 mL/min/{1.73_m2} (ref >59)
GLOBULIN, TOTAL: 2.3 g/dL (ref 1.5–4.5)
GLUCOSE: 113 mg/dL — AB (ref 65–99)
Potassium: 4.5 mmol/L (ref 3.5–5.2)
SODIUM: 143 mmol/L (ref 134–144)
Total Protein: 6.6 g/dL (ref 6.0–8.5)

## 2016-01-04 LAB — LIPID PANEL W/O CHOL/HDL RATIO
CHOLESTEROL TOTAL: 137 mg/dL (ref 100–199)
HDL: 59 mg/dL (ref >39)
LDL Calculated: 66 mg/dL (ref 0–99)
TRIGLYCERIDES: 61 mg/dL (ref 0–149)
VLDL Cholesterol Cal: 12 mg/dL (ref 5–40)

## 2016-01-04 LAB — HEPATITIS C ANTIBODY: Hep C Virus Ab: <0.1 s/co ratio (ref 0.0–0.9)

## 2016-01-05 ENCOUNTER — Encounter: Payer: Self-pay | Admitting: Unknown Physician Specialty

## 2016-01-11 ENCOUNTER — Other Ambulatory Visit: Payer: Self-pay | Admitting: Unknown Physician Specialty

## 2016-01-16 ENCOUNTER — Other Ambulatory Visit: Payer: Self-pay | Admitting: Family Medicine

## 2016-01-16 ENCOUNTER — Other Ambulatory Visit: Payer: Self-pay | Admitting: Unknown Physician Specialty

## 2016-01-22 ENCOUNTER — Ambulatory Visit
Admission: RE | Admit: 2016-01-22 | Discharge: 2016-01-22 | Disposition: A | Discharge status: Home / Self Care | Payer: PPO | Source: Ambulatory Visit | Attending: Unknown Physician Specialty | Admitting: Unknown Physician Specialty

## 2016-01-22 DIAGNOSIS — Z780 Asymptomatic menopausal state: Secondary | ICD-10-CM | POA: Insufficient documentation

## 2016-01-22 DIAGNOSIS — M85851 Other specified disorders of bone density and structure, right thigh: Secondary | ICD-10-CM | POA: Insufficient documentation

## 2016-01-24 ENCOUNTER — Encounter: Payer: Self-pay | Admitting: Unknown Physician Specialty

## 2016-02-23 DIAGNOSIS — H2513 Age-related nuclear cataract, bilateral: Secondary | ICD-10-CM | POA: Diagnosis not present

## 2016-02-23 LAB — HM DIABETES EYE EXAM

## 2016-03-05 ENCOUNTER — Other Ambulatory Visit: Payer: Self-pay | Admitting: Family Medicine

## 2016-04-12 ENCOUNTER — Ambulatory Visit: Payer: PPO

## 2016-04-22 ENCOUNTER — Other Ambulatory Visit: Payer: Self-pay | Admitting: Family Medicine

## 2016-05-06 ENCOUNTER — Other Ambulatory Visit: Payer: Self-pay | Admitting: Family Medicine

## 2016-05-06 ENCOUNTER — Other Ambulatory Visit: Payer: Self-pay | Admitting: Unknown Physician Specialty

## 2016-05-06 NOTE — Telephone Encounter (Signed)
Your patient 

## 2016-05-31 NOTE — Telephone Encounter (Signed)
Your patient 

## 2016-06-03 ENCOUNTER — Other Ambulatory Visit: Payer: Self-pay | Admitting: Unknown Physician Specialty

## 2016-07-09 ENCOUNTER — Ambulatory Visit: Payer: PPO | Admitting: Unknown Physician Specialty

## 2016-07-10 ENCOUNTER — Encounter: Payer: Self-pay | Admitting: Unknown Physician Specialty

## 2016-07-10 ENCOUNTER — Ambulatory Visit (INDEPENDENT_AMBULATORY_CARE_PROVIDER_SITE_OTHER): Payer: PPO | Admitting: Unknown Physician Specialty

## 2016-07-10 VITALS — BP 103/61 | HR 53 | Temp 98.3°F | Ht 61.0 in | Wt 166.6 lb

## 2016-07-10 DIAGNOSIS — Z23 Encounter for immunization: Secondary | ICD-10-CM

## 2016-07-10 DIAGNOSIS — N183 Chronic kidney disease, stage 3 unspecified: Secondary | ICD-10-CM

## 2016-07-10 DIAGNOSIS — E1122 Type 2 diabetes mellitus with diabetic chronic kidney disease: Secondary | ICD-10-CM

## 2016-07-10 DIAGNOSIS — I129 Hypertensive chronic kidney disease with stage 1 through stage 4 chronic kidney disease, or unspecified chronic kidney disease: Secondary | ICD-10-CM

## 2016-07-10 DIAGNOSIS — E782 Mixed hyperlipidemia: Secondary | ICD-10-CM | POA: Diagnosis not present

## 2016-07-10 LAB — BAYER DCA HB A1C WAIVED: HB A1C (BAYER DCA - WAIVED): 6.6 % (ref <7.0)

## 2016-07-10 MED ORDER — LISINOPRIL 10 MG PO TABS: 10.0000 mg | ORAL_TABLET | Freq: Every day | ORAL | 3 refills | Status: DC

## 2016-07-10 MED ORDER — CLOPIDOGREL BISULFATE 75 MG PO TABS: 75.0000 mg | ORAL_TABLET | Freq: Every day | ORAL | 3 refills | Status: DC

## 2016-07-10 MED ORDER — LEVOTHYROXINE SODIUM 50 MCG PO TABS: 50.0000 ug | ORAL_TABLET | Freq: Every day | ORAL | 3 refills | Status: DC

## 2016-07-10 MED ORDER — METFORMIN HCL ER 500 MG PO TB24: 2000.0000 mg | ORAL_TABLET | Freq: Every day | ORAL | 3 refills | Status: DC

## 2016-07-10 MED ORDER — ATORVASTATIN CALCIUM 40 MG PO TABS: 40.0000 mg | ORAL_TABLET | Freq: Every day | ORAL | 3 refills | Status: DC

## 2016-07-10 MED ORDER — HYDROCHLOROTHIAZIDE 25 MG PO TABS
25.0000 mg | ORAL_TABLET | Freq: Every day | ORAL | 3 refills | Status: DC
Start: 2016-07-10 — End: 2017-01-22

## 2016-07-10 NOTE — Progress Notes (Signed)
BP 103/61 (BP Location: Left Arm, Patient Position: Sitting, Cuff Size: Large)   Pulse (!) 53   Temp 98.3 F (36.8 C)   Ht 5\' 1"  (1.549 m)   Wt 166 lb 9.6 oz (75.6 kg)   LMP  (LMP Unknown)   SpO2 96%   BMI 31.48 kg/m    Subjective:    Patient ID: Teresa [name], female    DOB: 27-Sep-1945, 70 y.o.   MRN: [id number]  HPI: Teresa [name] is a 70 y.o. female  Chief Complaint  Patient presents with  . Diabetes  . Hyperlipidemia  . Hypertension  . Medication Refill    pt states she would like all meds sent in for a year supply  . Medication Problem    pt states she would like a cheaper medication than clobetasol if we know of any    Diabetes: Using medications without difficulties No hypoglycemic episodes No hyperglycemic episodes Feet problems:none Blood Sugars averaging:"days it goes up" but typically 120s eye exam within last year Last Hgb A1C: 6.7  Hypertension  Using medications without difficulty Average home BPs   Using medication without problems or lightheadedness No chest pain with exertion or shortness of breath No Edema  Elevated Cholesterol Using medications without problems No Muscle aches  Diet:/Exercise: Lost weight so tries to watch what she eats     Relevant past medical, surgical, family and social history reviewed and updated as indicated. Interim medical history since our last visit reviewed. Allergies and medications reviewed and updated.  Review of Systems  Per HPI unless specifically indicated above     Objective:    BP 103/61 (BP Location: Left Arm, Patient Position: Sitting, Cuff Size: Large)   Pulse (!) 53   Temp 98.3 F (36.8 C)   Ht 5\' 1"  (1.549 m)   Wt 166 lb 9.6 oz (75.6 kg)   LMP  (LMP Unknown)   SpO2 96%   BMI 31.48 kg/m   Wt Readings from Last 3 Encounters:  07/10/16 166 lb 9.6 oz (75.6 kg)  01/03/16 169 lb 12.8 oz (77 kg)  07/04/15 172 lb (78 kg)    Physical Exam  Constitutional: She is oriented to  person, place, and time. She appears well-developed and well-nourished. No distress.  HENT:  Head: Normocephalic and atraumatic.  Eyes: Conjunctivae and lids are normal. Right eye exhibits no discharge. Left eye exhibits no discharge. No scleral icterus.  Neck: Normal range of motion. Neck supple. No JVD present. Carotid bruit is not present.  Cardiovascular: Normal rate, regular rhythm and normal heart sounds.   Pulmonary/Chest: Effort normal and breath sounds normal.  Abdominal: Normal appearance. There is no splenomegaly or hepatomegaly.  Musculoskeletal: Normal range of motion.  Neurological: She is alert and oriented to person, place, and time.  Skin: Skin is warm, dry and intact. No rash noted. No pallor.  Psychiatric: She has a normal mood and affect. Her behavior is normal. Judgment and thought content normal.    Results for orders placed or performed in visit on 02/28/16  HM DIABETES EYE EXAM  Result Value Ref Range   HM Diabetic Eye Exam No Retinopathy No Retinopathy      Assessment & Plan:   Problem List Items Addressed This Visit      Unprioritized   Hyperlipidemia   Relevant Medications   atorvastatin (LIPITOR) 40 MG tablet   hydrochlorothiazide (HYDRODIURIL) 25 MG tablet   lisinopril (PRINIVIL,ZESTRIL) 10 MG tablet   Hypertensive CKD (chronic kidney  disease)   Relevant Orders   Comprehensive metabolic panel   Type 2 diabetes mellitus with stage 3 chronic kidney disease, without long-term current use of insulin (HCC)    Hgb A1C is 6.6      Relevant Medications   atorvastatin (LIPITOR) 40 MG tablet   lisinopril (PRINIVIL,ZESTRIL) 10 MG tablet   metFORMIN (GLUCOPHAGE-XR) 500 MG 24 hr tablet   Other Relevant Orders   Bayer DCA Hb A1c Waived   Comprehensive metabolic panel    Other Visit Diagnoses    Need for influenza vaccination    -  Primary   Relevant Orders   Flu vaccine HIGH DOSE PF (Completed)       Follow up plan: No Follow-up on file.

## 2016-07-10 NOTE — Assessment & Plan Note (Signed)
Hgb A1C is 6.6 

## 2016-07-10 NOTE — Patient Instructions (Signed)

## 2016-07-11 ENCOUNTER — Encounter: Payer: Self-pay | Admitting: Unknown Physician Specialty

## 2016-07-11 LAB — COMPREHENSIVE METABOLIC PANEL
A/G RATIO: 1.9 (ref 1.2–2.2)
ALT: 17 IU/L (ref 0–32)
AST: 17 IU/L (ref 0–40)
Albumin: 4.3 g/dL (ref 3.5–4.8)
Alkaline Phosphatase: 76 IU/L (ref 39–117)
BUN/Creatinine Ratio: 21 (ref 12–28)
BUN: 18 mg/dL (ref 8–27)
Bilirubin Total: <0.2 mg/dL (ref 0.0–1.2)
CALCIUM: 9.7 mg/dL (ref 8.7–10.3)
CO2: 23 mmol/L (ref 18–29)
Chloride: 105 mmol/L (ref 96–106)
Creatinine, Ser: 0.85 mg/dL (ref 0.57–1.00)
GFR, EST AFRICAN AMERICAN: 80 mL/min/{1.73_m2} (ref >59)
GFR, EST NON AFRICAN AMERICAN: 70 mL/min/{1.73_m2} (ref >59)
Globulin, Total: 2.3 g/dL (ref 1.5–4.5)
Glucose: 100 mg/dL — ABNORMAL HIGH (ref 65–99)
POTASSIUM: 5.3 mmol/L — AB (ref 3.5–5.2)
Sodium: 143 mmol/L (ref 134–144)
TOTAL PROTEIN: 6.6 g/dL (ref 6.0–8.5)

## 2016-08-07 ENCOUNTER — Encounter: Payer: Self-pay | Admitting: Family Medicine

## 2016-08-07 ENCOUNTER — Ambulatory Visit (INDEPENDENT_AMBULATORY_CARE_PROVIDER_SITE_OTHER): Payer: PPO | Admitting: Family Medicine

## 2016-08-07 VITALS — BP 121/70 | HR 62 | Temp 97.5°F | Wt 166.0 lb

## 2016-08-07 DIAGNOSIS — J069 Acute upper respiratory infection, unspecified: Secondary | ICD-10-CM

## 2016-08-07 MED ORDER — GUAIFENESIN-CODEINE 100-10 MG/5ML PO SOLN: 10.0000 mL | Freq: Three times a day (TID) | ORAL | 0 refills | Status: DC | PRN

## 2016-08-07 MED ORDER — AMOXICILLIN-POT CLAVULANATE 875-125 MG PO TABS: 1.0000 | ORAL_TABLET | Freq: Two times a day (BID) | ORAL | 0 refills | Status: DC

## 2016-08-07 NOTE — Progress Notes (Signed)
   BP 121/70   Pulse 62   Temp 97.5 F (36.4 C)   Wt 166 lb (75.3 kg)   LMP  (LMP Unknown)   SpO2 97%   BMI 31.37 kg/m    Subjective:    Patient ID: Teresa [name], female    DOB: 18-Nov-1945, 70 y.o.   MRN: [id number]  HPI: Teresa [name] is a 70 y.o. female  Chief Complaint  Patient presents with  . URI    off and on for a month, yesterday it was worse again. Chest congestion, non productive cough, right ear pain, some sinus headache, runny nose. No fever.    Patient presents with intermittent URI sxs x 1 month. States she is now having worsening chest congestion, hacking cough, right ear pain, and sinus headache. Denies fever, chills, CP, SOB, N/V/D. Taking tylenol and cold medicines OTC. Denies sick contacts.   Relevant past medical, surgical, family and social history reviewed and updated as indicated. Interim medical history since our last visit reviewed. Allergies and medications reviewed and updated.  Review of Systems  Constitutional: Negative.   HENT: Positive for congestion, ear pain and rhinorrhea.   Respiratory: Positive for cough.   Cardiovascular: Negative.   Gastrointestinal: Negative.   Genitourinary: Negative.   Musculoskeletal: Negative.   Neurological: Positive for headaches.  Psychiatric/Behavioral: Negative.     Per HPI unless specifically indicated above     Objective:    BP 121/70   Pulse 62   Temp 97.5 F (36.4 C)   Wt 166 lb (75.3 kg)   LMP  (LMP Unknown)   SpO2 97%   BMI 31.37 kg/m   Wt Readings from Last 3 Encounters:  08/07/16 166 lb (75.3 kg)  07/10/16 166 lb 9.6 oz (75.6 kg)  01/03/16 169 lb 12.8 oz (77 kg)    Physical Exam  Constitutional: She is oriented to person, place, and time.  HENT:  Head: Atraumatic.  Right Ear: External ear normal.  Left Ear: External ear normal.  Nose: Nose normal.  Right TM injected Oropharynx erythematous  Eyes: Conjunctivae are normal. Pupils are equal, round, and reactive to light.    Neck: Normal range of motion. Neck supple.  Cardiovascular: Normal rate, regular rhythm and normal heart sounds.   Pulmonary/Chest: Effort normal and breath sounds normal. No respiratory distress. She has no wheezes. She has no rales.  Musculoskeletal: Normal range of motion.  Lymphadenopathy:    She has no cervical adenopathy.  Neurological: She is alert and oriented to person, place, and time.  Skin: Skin is warm and dry.  Psychiatric: She has a normal mood and affect. Her behavior is normal.  Nursing note and vitals reviewed.     Assessment & Plan:   Problem List Items Addressed This Visit    None    Visit Diagnoses    Upper respiratory tract infection, unspecified type    -  Primary   Given duration, will send augmentin and robitussin AC. Continue supportive care. Follow up if no improvement       Follow up plan: Return if symptoms worsen or fail to improve.

## 2016-08-07 NOTE — Patient Instructions (Signed)
Follow up as needed

## 2016-10-25 ENCOUNTER — Encounter: Payer: Self-pay | Admitting: Unknown Physician Specialty

## 2016-12-27 ENCOUNTER — Telehealth: Payer: Self-pay | Admitting: Unknown Physician Specialty

## 2016-12-27 NOTE — Telephone Encounter (Signed)
Patient wants the needles (lancet) sent to [facility]. Patient would like to see if someone could get the lancet needles sent to the pharmacy as she is completely out of the needles.  Please Advise.  Thank you

## 2016-12-30 ENCOUNTER — Encounter: Payer: Self-pay | Admitting: Unknown Physician Specialty

## 2016-12-30 MED ORDER — LANCETS MISC: 1.0000 | Freq: Every day | 12 refills | Status: DC

## 2016-12-30 NOTE — Telephone Encounter (Signed)
Lancets T'd up for local pharmacy as she await's Mail order to come in.

## 2016-12-30 NOTE — Telephone Encounter (Signed)
Rx sent to her pharmacy 

## 2017-01-07 ENCOUNTER — Encounter: Payer: PPO | Admitting: Unknown Physician Specialty

## 2017-01-08 ENCOUNTER — Ambulatory Visit (INDEPENDENT_AMBULATORY_CARE_PROVIDER_SITE_OTHER): Payer: PPO

## 2017-01-08 VITALS — BP 112/58 | HR 58 | Temp 98.6°F | Resp 16 | Ht 61.0 in | Wt 160.8 lb

## 2017-01-08 DIAGNOSIS — Z0000 Encounter for general adult medical examination without abnormal findings: Secondary | ICD-10-CM | POA: Diagnosis not present

## 2017-01-08 NOTE — Progress Notes (Signed)
Subjective:   Teresa [name] is a 71 y.o. female who presents for Medicare Annual (Subsequent) preventive examination.  Review of Systems:   Cardiac Risk Factors include: advanced age (>27men, >11 women);hypertension;diabetes mellitus;obesity (BMI >30kg/m2);dyslipidemia     Objective:     Vitals: BP (!) 112/58 (BP Location: Left Arm, Patient Position: Sitting)   Pulse (!) 58   Temp 98.6 F (37 C)   Resp 16   Ht 5\' 1"  (1.549 m)   Wt 160 lb 12.8 oz (72.9 kg)   LMP  (LMP Unknown)   BMI 30.38 kg/m   Body mass index is 30.38 kg/m.   Tobacco History  Smoking Status  . Former Smoker  Smokeless Tobacco  . Never Used     Counseling given: Not Answered   Past Medical History:  Diagnosis Date  . Diabetes mellitus without complication ([place])   . Hyperlipidemia   . Hypertension   . Lichen sclerosus   . Obesity   . Osteopenia   . Stroke ([place])   . Thyroid disease    Past Surgical History:  Procedure Laterality Date  . ROTATOR CUFF REPAIR Left   . 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 Hypertension Mother   . Heart disease Mother   . Thyroid disease Mother   . Glaucoma Mother   . Stroke Father   . Diabetes Brother   . Hypertension Maternal Grandfather    History  Sexual Activity  . Sexual activity: No    Outpatient Encounter Prescriptions as of 01/08/2017  Medication Sig  . atorvastatin (LIPITOR) 40 MG tablet Take 1 tablet (40 mg total) by mouth at bedtime.  . clobetasol ointment (TEMOVATE) 0.05 % Apply topically twice a day  . clopidogrel (PLAVIX) 75 MG tablet Take 1 tablet (75 mg total) by mouth daily.  . hydrochlorothiazide (HYDRODIURIL) 25 MG tablet Take 1 tablet (25 mg total) by mouth daily.  . Lancets MISC 1 each by Does not apply route daily.  Marland Kitchen levothyroxine (SYNTHROID, LEVOTHROID) 50 MCG tablet Take 1 tablet (50 mcg total) by mouth daily.  Marland Kitchen lisinopril (PRINIVIL,ZESTRIL) 10 MG tablet Take 1 tablet (10 mg  total) by mouth daily.  . metFORMIN (GLUCOPHAGE-XR) 500 MG 24 hr tablet Take 4 tablets (2,000 mg total) by mouth daily with breakfast.  . ONE TOUCH ULTRA TEST test strip Test blood sugar once daily  . [DISCONTINUED] amoxicillin-clavulanate (AUGMENTIN) 875-125 MG tablet Take 1 tablet by mouth 2 (two) times daily.  . [DISCONTINUED] guaiFENesin-codeine 100-10 MG/5ML syrup Take 10 mLs by mouth 3 (three) times daily as needed for cough. (Patient not taking: Reported on 01/08/2017)   No facility-administered encounter medications on file as of 01/08/2017.     Activities of Daily Living In your present state of health, do you have any difficulty performing the following activities: 01/08/2017  Hearing? N  Vision? Y  Difficulty concentrating or making decisions? N  Walking or climbing stairs? N  Dressing or bathing? N  Doing errands, shopping? N  Preparing Food and eating ? N  Using the Toilet? N  In the past six months, have you accidently leaked urine? Y  Do you have problems with loss of bowel control? N  Managing your Medications? N  Managing your Finances? N  Housekeeping or managing your Housekeeping? N  Some recent data might be hidden    Patient Care Team: Kathrine Haddock, NP as PCP - General (Nurse Practitioner) Julian Hy,  Malachy Mood, NP as PCP - Family Medicine (Nurse Practitioner)    Assessment:     Exercise Activities and Dietary recommendations Current Exercise Habits: The patient does not participate in regular exercise at present, Exercise limited by: None identified  Goals    . Increase water intake          Recommend drinking at least 4-5 glasses of water a day.       Fall Risk Fall Risk  01/08/2017 01/03/2016 04/03/2015  Falls in the past year? No Yes No  Number falls in past yr: - 1 -  Injury with Fall? - Yes -   Depression Screen PHQ 2/9 Scores 01/08/2017 01/08/2017 01/03/2016  PHQ - 2 Score 0 0 0  PHQ- 9 Score 2 - -     Cognitive Function     6CIT Screen  01/08/2017  What Year? 0 points  What month? 0 points  What time? 0 points  Count back from 20 0 points  Months in reverse 0 points  Repeat phrase 2 points  Total Score 2    Immunization History  Administered Date(s) Administered  . Influenza, High Dose Seasonal PF 07/10/2016  . Influenza,inj,Quad PF,36+ Mos 07/04/2015  . Pneumococcal Conjugate-13 04/13/2014  . Pneumococcal Polysaccharide-23 10/14/2014  . Td 03/25/2003  . Tdap 10/05/2010  . Zoster 09/10/2006   Screening Tests Health Maintenance  Topic Date Due  . MAMMOGRAM  10/24/2016  . HEMOGLOBIN A1C  01/07/2017  . OPHTHALMOLOGY EXAM  02/22/2017  . INFLUENZA VACCINE  03/12/2017  . FOOT EXAM  07/10/2017  . TETANUS/TDAP  10/05/2020  . COLONOSCOPY  04/01/2024  . DEXA SCAN  Completed  . Hepatitis C Screening  Completed  . PNA vac Low Risk Adult  Completed      Plan:    I have personally reviewed and addressed the Medicare Annual Wellness questionnaire and have noted the following in the patient's chart:  A. Medical and social history B. Use of alcohol, tobacco or illicit drugs  C. Current medications and supplements D. Functional ability and status E.  Nutritional status F.  Physical activity G. Advance directives H. List of other physicians I.  Hospitalizations, surgeries, and ER visits in previous 12 months J.  [place] such as hearing and vision if needed, cognitive and depression L. Referrals and appointments - 01/22/2017 at 8:00am with Kathrine Haddock  In addition, I have reviewed and discussed with patient certain preventive protocols, quality metrics, and best practice recommendations. A written personalized care plan for preventive services as well as general preventive health recommendations were provided to patient.   Signed,  Tyler Aas, LPN Nurse Health Advisor   MD Recommendations: none

## 2017-01-08 NOTE — Patient Instructions (Addendum)
Teresa [name] , Thank you for taking time to come for your Medicare Wellness Visit. I appreciate your ongoing commitment to your health goals. Please review the following plan we discussed and let me know if I can assist you in the future.   Screening recommendations/referrals: Colonoscopy: Completed 05/04/2014, due 04/2024 Mammogram: Completed 10/25/2014, due now- call to schedule  Bone Density: Completed 01/22/2016 Recommended yearly ophthalmology/optometry visit for glaucoma screening and checkup Recommended yearly dental visit for hygiene and checkup  Vaccinations: Influenza vaccine: up to date, due 06/2017 Pneumococcal vaccine: completed series Tdap vaccine: up to date Shingles vaccine: up to date    Advanced directives: Advance directive discussed with you today. I have provided a copy for you to complete at home and have notarized. Once this is complete please bring a copy in to our office so we can scan it into your chart.  Conditions/risks identified: Recommend drinking at least 4-5 glasses of water a day.   Next appointment: Follow up on 01/22/2017 at 8:00am with Kathrine Haddock. Follow up in one year for your annual wellness exam.    Preventive Care 65 Years and Older, Female Preventive care refers to lifestyle choices and visits with your health care provider that can promote health and wellness. What does preventive care include?  A yearly physical exam. This is also called an annual well check.  Dental exams once or twice a year.  Routine eye exams. Ask your health care provider how often you should have your eyes checked.  Personal lifestyle choices, including:  Daily care of your teeth and gums.  Regular physical activity.  Eating a healthy diet.  Avoiding tobacco and drug use.  Limiting alcohol use.  Practicing safe sex.  Taking low-dose aspirin every day.  Taking vitamin and mineral supplements as recommended by your health care provider. What happens during  an annual well check? The services and screenings done by your health care provider during your annual well check will depend on your age, overall health, lifestyle risk factors, and family history of disease. Counseling  Your health care provider may ask you questions about your:  Alcohol use.  Tobacco use.  Drug use.  Emotional well-being.  Home and relationship well-being.  Sexual activity.  Eating habits.  History of falls.  Memory and ability to understand (cognition).  Work and work Statistician.  Reproductive health. Screening  You may have the following tests or measurements:  Height, weight, and BMI.  Blood pressure.  Lipid and cholesterol levels. These may be checked every 5 years, or more frequently if you are over 9 years old.  Skin check.  Lung cancer screening. You may have this screening every year starting at age 64 if you have a 30-pack-year history of smoking and currently smoke or have quit within the past 15 years.  Fecal occult blood test (FOBT) of the stool. You may have this test every year starting at age 78.  Flexible sigmoidoscopy or colonoscopy. You may have a sigmoidoscopy every 5 years or a colonoscopy every 10 years starting at age 63.  Hepatitis C blood test.  Hepatitis B blood test.  Sexually transmitted disease (STD) testing.  Diabetes screening. This is done by checking your blood sugar (glucose) after you have not eaten for a while (fasting). You may have this done every 1-3 years.  Bone density scan. This is done to screen for osteoporosis. You may have this done starting at age 63.  Mammogram. This may be done every 1-2 years. Talk  to your health care provider about how often you should have regular mammograms. Talk with your health care provider about your test results, treatment options, and if necessary, the need for more tests. Vaccines  Your health care provider may recommend certain vaccines, such as:  Influenza  vaccine. This is recommended every year.  Tetanus, diphtheria, and acellular pertussis (Tdap, Td) vaccine. You may need a Td booster every 10 years.  Zoster vaccine. You may need this after age 100.  Pneumococcal 13-valent conjugate (PCV13) vaccine. One dose is recommended after age 52.  Pneumococcal polysaccharide (PPSV23) vaccine. One dose is recommended after age 60. Talk to your health care provider about which screenings and vaccines you need and how often you need them. This information is not intended to replace advice given to you by your health care provider. Make sure you discuss any questions you have with your health care provider. Document Released: 08/25/2015 Document Revised: 04/17/2016 Document Reviewed: 05/30/2015 Elsevier Interactive Patient Education  2017 [place] Prevention in the Home Falls can cause injuries. They can happen to people of all ages. There are many things you can do to make your home safe and to help prevent falls. What can I do on the outside of my home?  Regularly fix the edges of walkways and driveways and fix any cracks.  Remove anything that might make you trip as you walk through a door, such as a raised step or threshold.  Trim any bushes or trees on the path to your home.  Use bright outdoor lighting.  Clear any walking paths of anything that might make someone trip, such as rocks or tools.  Regularly check to see if handrails are loose or broken. Make sure that both sides of any steps have handrails.  Any raised decks and porches should have guardrails on the edges.  Have any leaves, snow, or ice cleared regularly.  Use sand or salt on walking paths during winter.  Clean up any spills in your garage right away. This includes oil or grease spills. What can I do in the bathroom?  Use night lights.  Install grab bars by the toilet and in the tub and shower. Do not use towel bars as grab bars.  Use non-skid mats or decals  in the tub or shower.  If you need to sit down in the shower, use a plastic, non-slip stool.  Keep the floor dry. Clean up any water that spills on the floor as soon as it happens.  Remove soap buildup in the tub or shower regularly.  Attach bath mats securely with double-sided non-slip rug tape.  Do not have throw rugs and other things on the floor that can make you trip. What can I do in the bedroom?  Use night lights.  Make sure that you have a light by your bed that is easy to reach.  Do not use any sheets or blankets that are too big for your bed. They should not hang down onto the floor.  Have a firm chair that has side arms. You can use this for support while you get dressed.  Do not have throw rugs and other things on the floor that can make you trip. What can I do in the kitchen?  Clean up any spills right away.  Avoid walking on wet floors.  Keep items that you use a lot in easy-to-reach places.  If you need to reach something above you, use a strong step stool that  has a grab bar.  Keep electrical cords out of the way.  Do not use floor polish or wax that makes floors slippery. If you must use wax, use non-skid floor wax.  Do not have throw rugs and other things on the floor that can make you trip. What can I do with my stairs?  Do not leave any items on the stairs.  Make sure that there are handrails on both sides of the stairs and use them. Fix handrails that are broken or loose. Make sure that handrails are as long as the stairways.  Check any carpeting to make sure that it is firmly attached to the stairs. Fix any carpet that is loose or worn.  Avoid having throw rugs at the top or bottom of the stairs. If you do have throw rugs, attach them to the floor with carpet tape.  Make sure that you have a light switch at the top of the stairs and the bottom of the stairs. If you do not have them, ask someone to add them for you. What else can I do to help  prevent falls?  Wear shoes that:  Do not have high heels.  Have rubber bottoms.  Are comfortable and fit you well.  Are closed at the toe. Do not wear sandals.  If you use a stepladder:  Make sure that it is fully opened. Do not climb a closed stepladder.  Make sure that both sides of the stepladder are locked into place.  Ask someone to hold it for you, if possible.  Clearly mark and make sure that you can see:  Any grab bars or handrails.  First and last steps.  Where the edge of each step is.  Use tools that help you move around (mobility aids) if they are needed. These include:  Canes.  Walkers.  Scooters.  Crutches.  Turn on the lights when you go into a dark area. Replace any light bulbs as soon as they burn out.  Set up your furniture so you have a clear path. Avoid moving your furniture around.  If any of your floors are uneven, fix them.  If there are any pets around you, be aware of where they are.  Review your medicines with your doctor. Some medicines can make you feel dizzy. This can increase your chance of falling. Ask your doctor what other things that you can do to help prevent falls. This information is not intended to replace advice given to you by your health care provider. Make sure you discuss any questions you have with your health care provider. Document Released: 05/25/2009 Document Revised: 01/04/2016 Document Reviewed: 09/02/2014 Elsevier Interactive Patient Education  2017 [employer].

## 2017-01-22 ENCOUNTER — Encounter: Payer: Self-pay | Admitting: Unknown Physician Specialty

## 2017-01-22 ENCOUNTER — Other Ambulatory Visit: Payer: Self-pay | Admitting: Unknown Physician Specialty

## 2017-01-22 ENCOUNTER — Ambulatory Visit (INDEPENDENT_AMBULATORY_CARE_PROVIDER_SITE_OTHER): Payer: PPO | Admitting: Unknown Physician Specialty

## 2017-01-22 VITALS — BP 125/63 | HR 50 | Temp 97.9°F | Ht 61.0 in | Wt 158.7 lb

## 2017-01-22 DIAGNOSIS — I129 Hypertensive chronic kidney disease with stage 1 through stage 4 chronic kidney disease, or unspecified chronic kidney disease: Secondary | ICD-10-CM | POA: Diagnosis not present

## 2017-01-22 DIAGNOSIS — N183 Chronic kidney disease, stage 3 unspecified: Secondary | ICD-10-CM

## 2017-01-22 DIAGNOSIS — D649 Anemia, unspecified: Secondary | ICD-10-CM

## 2017-01-22 DIAGNOSIS — I10 Essential (primary) hypertension: Secondary | ICD-10-CM | POA: Diagnosis not present

## 2017-01-22 DIAGNOSIS — Z7189 Other specified counseling: Secondary | ICD-10-CM | POA: Insufficient documentation

## 2017-01-22 DIAGNOSIS — E039 Hypothyroidism, unspecified: Secondary | ICD-10-CM | POA: Diagnosis not present

## 2017-01-22 DIAGNOSIS — Z1231 Encounter for screening mammogram for malignant neoplasm of breast: Secondary | ICD-10-CM

## 2017-01-22 DIAGNOSIS — E1122 Type 2 diabetes mellitus with diabetic chronic kidney disease: Secondary | ICD-10-CM

## 2017-01-22 DIAGNOSIS — E782 Mixed hyperlipidemia: Secondary | ICD-10-CM

## 2017-01-22 LAB — BAYER DCA HB A1C WAIVED: HB A1C (BAYER DCA - WAIVED): 5.9 % (ref <7.0)

## 2017-01-22 MED ORDER — METFORMIN HCL ER 500 MG PO TB24: 2000.0000 mg | ORAL_TABLET | Freq: Every day | ORAL | 3 refills | Status: DC

## 2017-01-22 MED ORDER — ATORVASTATIN CALCIUM 40 MG PO TABS: 40.0000 mg | ORAL_TABLET | Freq: Every day | ORAL | 3 refills | Status: DC

## 2017-01-22 MED ORDER — LEVOTHYROXINE SODIUM 50 MCG PO TABS: 50.0000 ug | ORAL_TABLET | Freq: Every day | ORAL | 3 refills | Status: DC

## 2017-01-22 MED ORDER — CLOPIDOGREL BISULFATE 75 MG PO TABS: 75.0000 mg | ORAL_TABLET | Freq: Every day | ORAL | 3 refills | Status: DC

## 2017-01-22 MED ORDER — LISINOPRIL 10 MG PO TABS: 10.0000 mg | ORAL_TABLET | Freq: Every day | ORAL | 3 refills | Status: DC

## 2017-01-22 MED ORDER — HYDROCHLOROTHIAZIDE 25 MG PO TABS: 25.0000 mg | ORAL_TABLET | Freq: Every day | ORAL | 3 refills | Status: DC

## 2017-01-22 NOTE — Assessment & Plan Note (Signed)
Stable, continue present medications.   

## 2017-01-22 NOTE — Telephone Encounter (Signed)
Patient would like for medication to be sent to [facility] mail order pharmacy instead of [place].  Please Advise  Thank you

## 2017-01-22 NOTE — Progress Notes (Signed)
BP 125/63 (BP Location: Left Arm, Patient Position: Sitting, Cuff Size: Normal)   Pulse (!) 50   Temp 97.9 F (36.6 C)   Ht 5\' 1"  (1.549 m)   Wt 158 lb 11.2 oz (72 kg)   LMP  (LMP Unknown)   SpO2 100%   BMI 29.99 kg/m    Subjective:    Patient ID: Teresa [name], female    DOB: Mar 21, 1946, 71 y.o.   MRN: [id number]  HPI: Teresa [name] is a 71 y.o. female  Chief Complaint  Patient presents with  . Annual Exam   Diabetes: Using medications without difficulties No hypoglycemic episodes No hyperglycemic episodes Feet problems:one ankle due to spinal stenosis Blood Sugars averaging: 120-140 eye exam within last year Last Hgb A1C: 6.6  Hypertension  Using medications without difficulty Average home BPs 113/60   Using medication without problems or lightheadedness No chest pain with exertion or shortness of breath No Edema  Elevated Cholesterol Using medications without problems No Muscle aches  Diet /Exercise: Hardly any exercise but busy  Hypothyroid Feels weight and energy are stable  Social History   Social History  . Marital status: Married    Spouse name: N/A  . Number of children: N/A  . Years of education: N/A   Occupational History  . Not on file.   Social History Main Topics  . Smoking status: Former Research scientist (life sciences)  . Smokeless tobacco: Never Used  . Alcohol use No  . Drug use: No  . Sexual activity: No   Other Topics Concern  . Not on file   Social History Narrative  . No narrative on file   Family History  Problem Relation Age of Onset  . Hypertension Mother   . Heart disease Mother   . Thyroid disease Mother   . Glaucoma Mother   . Stroke Father   . Diabetes Brother   . Hypertension Maternal Grandfather    Past Medical History:  Diagnosis Date  . Diabetes mellitus without complication ([place])   . Hyperlipidemia   . Hypertension   . Lichen sclerosus   . Obesity   . Osteopenia   . Stroke ([place])   . Thyroid disease    Past  Surgical History:  Procedure Laterality Date  . ROTATOR CUFF REPAIR Left   . 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 Interim medical history since our last visit reviewed. Allergies and medications reviewed and updated.  Review of Systems  Constitutional: Negative.   HENT: Negative.   Eyes: Negative.   Respiratory:       Some SOB if trying to walk fast  Cardiovascular: Negative.   Gastrointestinal: Negative.   Endocrine: Negative.   Genitourinary: Negative.   Musculoskeletal: Negative.   Skin: Negative.   Allergic/Immunologic: Negative.   Neurological: Negative.   Hematological: Negative.   Psychiatric/Behavioral: Negative.     Per HPI unless specifically indicated above     Objective:    BP 125/63 (BP Location: Left Arm, Patient Position: Sitting, Cuff Size: Normal)   Pulse (!) 50   Temp 97.9 F (36.6 C)   Ht 5\' 1"  (1.549 m)   Wt 158 lb 11.2 oz (72 kg)   LMP  (LMP Unknown)   SpO2 100%   BMI 29.99 kg/m   Wt Readings from Last 3 Encounters:  01/22/17 158 lb 11.2 oz (72 kg)  01/08/17 160 lb 12.8 oz (72.9  kg)  08/07/16 166 lb (75.3 kg)    Physical Exam  Constitutional: She is oriented to person, place, and time. She appears well-developed and well-nourished.  HENT:  Head: Normocephalic and atraumatic.  Eyes: Pupils are equal, round, and reactive to light. Right eye exhibits no discharge. Left eye exhibits no discharge. No scleral icterus.  Neck: Normal range of motion. Neck supple. Carotid bruit is not present. No thyromegaly present.  Cardiovascular: Normal rate, regular rhythm and normal heart sounds.  Exam reveals no gallop and no friction rub.   No murmur heard. Pulmonary/Chest: Effort normal and breath sounds normal. No respiratory distress. She has no wheezes. She has no rales.  Abdominal: Soft. Bowel sounds are normal. There is no tenderness. There is no rebound.    Genitourinary: No breast swelling, tenderness or discharge.  Musculoskeletal: Normal range of motion.  Lymphadenopathy:    She has no cervical adenopathy.  Neurological: She is alert and oriented to person, place, and time.  Skin: Skin is warm, dry and intact. No rash noted.  Psychiatric: She has a normal mood and affect. Her speech is normal and behavior is normal. Judgment and thought content normal. Cognition and memory are normal.    Results for orders placed or performed in visit on 07/10/16  Bayer DCA Hb A1c Waived  Result Value Ref Range   Bayer DCA Hb A1c Waived 6.6 <7.0 %  Comprehensive metabolic panel  Result Value Ref Range   Glucose 100 (H) 65 - 99 mg/dL   BUN 18 8 - 27 mg/dL   Creatinine, Ser 0.85 0.57 - 1.00 mg/dL   GFR calc non Af Amer 70 >59 mL/min/1.73   GFR calc Af Amer 80 >59 mL/min/1.73   BUN/Creatinine Ratio 21 12 - 28   Sodium 143 134 - 144 mmol/L   Potassium 5.3 (H) 3.5 - 5.2 mmol/L   Chloride 105 96 - 106 mmol/L   CO2 23 18 - 29 mmol/L   Calcium 9.7 8.7 - 10.3 mg/dL   Total Protein 6.6 6.0 - 8.5 g/dL   Albumin 4.3 3.5 - 4.8 g/dL   Globulin, Total 2.3 1.5 - 4.5 g/dL   Albumin/Globulin Ratio 1.9 1.2 - 2.2   Bilirubin Total <0.2 0.0 - 1.2 mg/dL   Alkaline Phosphatase 76 39 - 117 IU/L   AST 17 0 - 40 IU/L   ALT 17 0 - 32 IU/L      Assessment & Plan:   Problem List Items Addressed This Visit      Unprioritized   Advanced care planning/counseling discussion    A voluntary discussion about advance care planning including the explanation and discussion of advance directives was extensively discussed  with the patient.  Explanation about the health care proxy and Living will was reviewed and packet with forms with explanation of how to fill them out was given.  During this discussion, the patient was able to identify a health care proxy as her husband and son as secondary and possibly plans to fill out the paperwork required.  Patient was offered a separate  [place] visit for further assistance with forms.  She states she does not want to be kept on life support.         Hyperlipidemia    Stable, continue present medications.        Hypertension - Primary    Stable, continue present medications.        Relevant Orders   Comprehensive metabolic panel   Lipid Panel  w/o Chol/HDL Ratio   Hypertensive CKD (chronic kidney disease)    Stable, continue present medications.        Hypothyroidism    Stable, continue present medications.        Relevant Orders   TSH   Type 2 diabetes mellitus with stage 3 chronic kidney disease, without long-term current use of insulin (HCC)    Stable, continue present medications.        Relevant Orders   CBC with Differential/Platelet   Bayer DCA Hb A1c Waived    Other Visit Diagnoses    Encounter for screening mammogram for breast cancer       Relevant Orders   MM DIGITAL SCREENING BILATERAL       Follow up plan: Return in about 6 months (around 07/24/2017).

## 2017-01-22 NOTE — Patient Instructions (Signed)
Please do call to schedule your mammogram; the number to schedule one at either [facility] or [facility] is [phone]   

## 2017-01-22 NOTE — Assessment & Plan Note (Signed)
A voluntary discussion about advance care planning including the explanation and discussion of advance directives was extensively discussed  with the patient.  Explanation about the health care proxy and Living will was reviewed and packet with forms with explanation of how to fill them out was given.  During this discussion, the patient was able to identify a health care proxy as her husband and son as secondary and possibly plans to fill out the paperwork required.  Patient was offered a separate [place] visit for further assistance with forms.  She states she does not want to be kept on life support.

## 2017-01-22 NOTE — Telephone Encounter (Signed)
[facility] in chart.

## 2017-01-23 LAB — CBC WITH DIFFERENTIAL/PLATELET
BASOS ABS: 0 10*3/uL (ref 0.0–0.2)
Basos: 0 %
EOS (ABSOLUTE): 0.1 10*3/uL (ref 0.0–0.4)
Eos: 1 %
HEMATOCRIT: 31.6 % — AB (ref 34.0–46.6)
HEMOGLOBIN: 10.5 g/dL — AB (ref 11.1–15.9)
Immature Grans (Abs): 0 10*3/uL (ref 0.0–0.1)
Immature Granulocytes: 0 %
LYMPHS ABS: 2.3 10*3/uL (ref 0.7–3.1)
Lymphs: 31 %
MCH: 30.2 pg (ref 26.6–33.0)
MCHC: 33.2 g/dL (ref 31.5–35.7)
MCV: 91 fL (ref 79–97)
MONOCYTES: 9 %
MONOS ABS: 0.7 10*3/uL (ref 0.1–0.9)
NEUTROS ABS: 4.3 10*3/uL (ref 1.4–7.0)
Neutrophils: 59 %
Platelets: 260 10*3/uL (ref 150–379)
RBC: 3.48 x10E6/uL — ABNORMAL LOW (ref 3.77–5.28)
RDW: 13.5 % (ref 12.3–15.4)
WBC: 7.3 10*3/uL (ref 3.4–10.8)

## 2017-01-23 LAB — COMPREHENSIVE METABOLIC PANEL
A/G RATIO: 1.9 (ref 1.2–2.2)
ALBUMIN: 4.3 g/dL (ref 3.5–4.8)
ALT: 27 IU/L (ref 0–32)
AST: 23 IU/L (ref 0–40)
Alkaline Phosphatase: 75 IU/L (ref 39–117)
BILIRUBIN TOTAL: 0.3 mg/dL (ref 0.0–1.2)
BUN / CREAT RATIO: 23 (ref 12–28)
BUN: 20 mg/dL (ref 8–27)
CHLORIDE: 104 mmol/L (ref 96–106)
CO2: 25 mmol/L (ref 20–29)
Calcium: 9.9 mg/dL (ref 8.7–10.3)
Creatinine, Ser: 0.88 mg/dL (ref 0.57–1.00)
GFR, EST AFRICAN AMERICAN: 77 mL/min/{1.73_m2} (ref >59)
GFR, EST NON AFRICAN AMERICAN: 67 mL/min/{1.73_m2} (ref >59)
Globulin, Total: 2.3 g/dL (ref 1.5–4.5)
Glucose: 108 mg/dL — ABNORMAL HIGH (ref 65–99)
POTASSIUM: 4.5 mmol/L (ref 3.5–5.2)
Sodium: 142 mmol/L (ref 134–144)
TOTAL PROTEIN: 6.6 g/dL (ref 6.0–8.5)

## 2017-01-23 LAB — LIPID PANEL W/O CHOL/HDL RATIO
Cholesterol, Total: 120 mg/dL (ref 100–199)
HDL: 45 mg/dL (ref >39)
LDL Calculated: 60 mg/dL (ref 0–99)
Triglycerides: 76 mg/dL (ref 0–149)
VLDL CHOLESTEROL CAL: 15 mg/dL (ref 5–40)

## 2017-01-23 LAB — TSH: TSH: 2.15 u[IU]/mL (ref 0.450–4.500)

## 2017-01-24 NOTE — Addendum Note (Signed)
Addended by: Kathrine Haddock on: 01/24/2017 07:43 AM   Modules accepted: Orders

## 2017-02-24 ENCOUNTER — Ambulatory Visit
Admission: RE | Admit: 2017-02-24 | Discharge: 2017-02-24 | Disposition: A | Discharge status: Home / Self Care | Payer: PPO | Source: Ambulatory Visit | Attending: Unknown Physician Specialty | Admitting: Unknown Physician Specialty

## 2017-02-24 DIAGNOSIS — Z1231 Encounter for screening mammogram for malignant neoplasm of breast: Secondary | ICD-10-CM | POA: Diagnosis not present

## 2017-03-03 DIAGNOSIS — E119 Type 2 diabetes mellitus without complications: Secondary | ICD-10-CM | POA: Diagnosis not present

## 2017-03-10 ENCOUNTER — Other Ambulatory Visit: Payer: PPO

## 2017-03-10 DIAGNOSIS — D649 Anemia, unspecified: Secondary | ICD-10-CM

## 2017-03-11 ENCOUNTER — Telehealth: Payer: Self-pay | Admitting: Unknown Physician Specialty

## 2017-03-11 ENCOUNTER — Encounter: Payer: Self-pay | Admitting: Unknown Physician Specialty

## 2017-03-11 DIAGNOSIS — E538 Deficiency of other specified B group vitamins: Secondary | ICD-10-CM

## 2017-03-11 LAB — ANEMIA PANEL
FOLATE, HEMOLYSATE: 303.1 ng/mL
Ferritin: 44 ng/mL (ref 15–150)
Folate, RBC: 953 ng/mL (ref >498)
Hematocrit: 31.8 % — ABNORMAL LOW (ref 34.0–46.6)
IRON: 52 ug/dL (ref 27–139)
Iron Saturation: 19 % (ref 15–55)
RETIC CT PCT: 0.9 % (ref 0.6–2.6)
Total Iron Binding Capacity: 280 ug/dL (ref 250–450)
UIBC: 228 ug/dL (ref 118–369)

## 2017-03-11 LAB — CBC WITH DIFFERENTIAL/PLATELET
BASOS: 0 %
Basophils Absolute: 0 10*3/uL (ref 0.0–0.2)
EOS (ABSOLUTE): 0 10*3/uL (ref 0.0–0.4)
EOS: 1 %
HEMOGLOBIN: 10.5 g/dL — AB (ref 11.1–15.9)
IMMATURE GRANULOCYTES: 0 %
Immature Grans (Abs): 0 10*3/uL (ref 0.0–0.1)
Lymphocytes Absolute: 2.1 10*3/uL (ref 0.7–3.1)
Lymphs: 31 %
MCH: 30.4 pg (ref 26.6–33.0)
MCHC: 33 g/dL (ref 31.5–35.7)
MCV: 92 fL (ref 79–97)
MONOS ABS: 0.5 10*3/uL (ref 0.1–0.9)
Monocytes: 7 %
Neutrophils Absolute: 4.1 10*3/uL (ref 1.4–7.0)
Neutrophils: 61 %
PLATELETS: 241 10*3/uL (ref 150–379)
RBC: 3.45 x10E6/uL — AB (ref 3.77–5.28)
RDW: 13.6 % (ref 12.3–15.4)
WBC: 6.7 10*3/uL (ref 3.4–10.8)

## 2017-03-11 NOTE — Telephone Encounter (Signed)
Discussed Vitamin B12 is low.  She would like to start oral.  Will recheck in three months.

## 2017-03-12 ENCOUNTER — Encounter: Payer: Self-pay | Admitting: Unknown Physician Specialty

## 2017-03-12 NOTE — Telephone Encounter (Signed)
Called pt to schedule. Patient not available. Will try again this afternoon

## 2017-04-16 ENCOUNTER — Telehealth: Payer: Self-pay | Admitting: Unknown Physician Specialty

## 2017-04-16 NOTE — Telephone Encounter (Signed)
Routing to provider. Patient last seen 01/22/17 and has appointment in December. Would patient need an appointment for this?

## 2017-04-16 NOTE — Telephone Encounter (Signed)
Please call and schedule patient an appointment per Malachy Mood to access her stomach pain.

## 2017-04-16 NOTE — Telephone Encounter (Signed)
Patient is having pain in the stomach area and thinks it is her gallbladder.  She wanted me to check with Malachy Mood to see if she would order an xray for her.  Please advise.  Thank you  [phone]

## 2017-04-16 NOTE — Telephone Encounter (Signed)
Yes, this is something I  Which a visit is necessary

## 2017-04-17 NOTE — Telephone Encounter (Signed)
Spoke with patient she will try to be in tomorrow afternoon at 3:15.   Scheduled appt will call back if she cannot keep it

## 2017-04-18 ENCOUNTER — Encounter: Payer: Self-pay | Admitting: Unknown Physician Specialty

## 2017-04-18 ENCOUNTER — Ambulatory Visit (INDEPENDENT_AMBULATORY_CARE_PROVIDER_SITE_OTHER): Payer: PPO | Admitting: Unknown Physician Specialty

## 2017-04-18 VITALS — BP 115/69 | HR 59 | Temp 97.3°F | Wt 152.8 lb

## 2017-04-18 DIAGNOSIS — R1013 Epigastric pain: Secondary | ICD-10-CM | POA: Diagnosis not present

## 2017-04-18 DIAGNOSIS — Z23 Encounter for immunization: Secondary | ICD-10-CM | POA: Diagnosis not present

## 2017-04-18 MED ORDER — OMEPRAZOLE 20 MG PO CPDR: 20.0000 mg | DELAYED_RELEASE_CAPSULE | Freq: Every day | ORAL | 3 refills | Status: DC

## 2017-04-18 NOTE — Progress Notes (Signed)
BP 115/69   Pulse (!) 59   Temp (!) 97.3 F (36.3 C)   Wt 152 lb 12.8 oz (69.3 kg)   LMP  (LMP Unknown)   SpO2 100%   BMI 28.87 kg/m    Subjective:    Patient ID: Teresa [name], female    DOB: 1945-09-11, 71 y.o.   MRN: [id number]  HPI: Teresa [name] is a 71 y.o. female  Chief Complaint  Patient presents with  . Abdominal Pain    pt states she has been having upper abdominal pain for about 2 weeks, no vomiting, but has had a little nausea and diarrhea    Abdominal Pain  This is a new problem. Episode onset: 2 weeks ago. The onset quality is gradual. The problem occurs constantly. The problem has been gradually worsening. The pain is located in the epigastric region. The pain is severe. The quality of the pain is colicky and cramping. The abdominal pain does not radiate. Associated symptoms include belching, flatus and nausea. Pertinent negatives include no constipation, diarrhea or vomiting. Exacerbated by: eating a steak. The pain is relieved by nothing. She has tried nothing for the symptoms.     Relevant past medical, surgical, family and social history reviewed and updated as indicated. Interim medical history since our last visit reviewed. Allergies and medications reviewed and updated.  Review of Systems  Gastrointestinal: Positive for abdominal pain, flatus and nausea. Negative for constipation, diarrhea and vomiting.    Per HPI unless specifically indicated above     Objective:    BP 115/69   Pulse (!) 59   Temp (!) 97.3 F (36.3 C)   Wt 152 lb 12.8 oz (69.3 kg)   LMP  (LMP Unknown)   SpO2 100%   BMI 28.87 kg/m   Wt Readings from Last 3 Encounters:  04/18/17 152 lb 12.8 oz (69.3 kg)  01/22/17 158 lb 11.2 oz (72 kg)  01/08/17 160 lb 12.8 oz (72.9 kg)    Physical Exam  Constitutional: She is oriented to person, place, and time. She appears well-developed and well-nourished. No distress.  HENT:  Head: Normocephalic and atraumatic.  Eyes:  Conjunctivae and lids are normal. Right eye exhibits no discharge. Left eye exhibits no discharge. No scleral icterus.  Neck: Normal range of motion. Neck supple. No JVD present. Carotid bruit is not present.  Cardiovascular: Normal rate, regular rhythm and normal heart sounds.   Pulmonary/Chest: Effort normal and breath sounds normal.  Abdominal: Normal appearance. There is no splenomegaly or hepatomegaly.  Musculoskeletal: Normal range of motion.  Neurological: She is alert and oriented to person, place, and time.  Skin: Skin is warm, dry and intact. No rash noted. No pallor.  Psychiatric: She has a normal mood and affect. Her behavior is normal. Judgment and thought content normal.    Results for orders placed or performed in visit on 03/10/17  CBC with Differential/Platelet  Result Value Ref Range   WBC 6.7 3.4 - 10.8 x10E3/uL   RBC 3.45 (L) 3.77 - 5.28 x10E6/uL   Hemoglobin 10.5 (L) 11.1 - 15.9 g/dL   MCV 92 79 - 97 fL   MCH 30.4 26.6 - 33.0 pg   MCHC 33.0 31.5 - 35.7 g/dL   RDW 13.6 12.3 - 15.4 %   Platelets 241 150 - 379 x10E3/uL   Neutrophils 61 Not Estab. %   Lymphs 31 Not Estab. %   Monocytes 7 Not Estab. %   Eos 1 Not Estab. %  Basos 0 Not Estab. %   Neutrophils Absolute 4.1 1.4 - 7.0 x10E3/uL   Lymphocytes Absolute 2.1 0.7 - 3.1 x10E3/uL   Monocytes Absolute 0.5 0.1 - 0.9 x10E3/uL   EOS (ABSOLUTE) 0.0 0.0 - 0.4 x10E3/uL   Basophils Absolute 0.0 0.0 - 0.2 x10E3/uL   Immature Granulocytes 0 Not Estab. %   Immature Grans (Abs) 0.0 0.0 - 0.1 x10E3/uL  Anemia panel  Result Value Ref Range   Total Iron Binding Capacity 280 250 - 450 ug/dL   UIBC 228 118 - 369 ug/dL   Iron 52 27 - 139 ug/dL   Iron Saturation 19 15 - 55 %   Vitamin B-12 <150 (L) 232 - 1245 pg/mL   Folate, Hemolysate 303.1 Not Estab. ng/mL   Hematocrit 31.8 (L) 34.0 - 46.6 %   Folate, RBC 953 >498 ng/mL   Ferritin 44 15 - 150 ng/mL   Retic Ct Pct 0.9 0.6 - 2.6 %      Assessment & Plan:   Problem  List Items Addressed This Visit    None    Visit Diagnoses    Need for influenza vaccination    -  Primary   Relevant Orders   Flu vaccine HIGH DOSE PF (Completed)   Epigastric pain       Check CBC, Amylase, Lipase.  Suspect gall bladder disease.  Schedule [place].  Start Omeprazole daily.  Avoid high fat and protein diet   Relevant Orders   US Abdomen Complete   CBC with Differential/Platelet   Amylase   Lipase       Follow up plan: Return for f/u with results.

## 2017-04-18 NOTE — Patient Instructions (Addendum)
Influenza (Flu) Vaccine (Inactivated or Recombinant): What You Need to Know 1. Why get vaccinated? Influenza ("flu") is a contagious disease that spreads around the [place] every year, usually between October and May. Flu is caused by influenza viruses, and is spread mainly by coughing, sneezing, and close contact. Anyone can get flu. Flu strikes suddenly and can last several days. Symptoms vary by age, but can include:  fever/chills  sore throat  muscle aches  fatigue  cough  headache  runny or stuffy nose  Flu can also lead to pneumonia and blood infections, and cause diarrhea and seizures in children. If you have a medical condition, such as heart or lung disease, flu can make it worse. Flu is more dangerous for some people. Infants and young children, people 23 years of age and older, pregnant women, and people with certain health conditions or a weakened immune system are at greatest risk. Each year thousands of people in the [place] die from flu, and many more are hospitalized. Flu vaccine can:  keep you from getting flu,  make flu less severe if you do get it, and  keep you from spreading flu to your family and other people. 2. Inactivated and recombinant flu vaccines A dose of flu vaccine is recommended every flu season. Children 6 months through 91 years of age may need two doses during the same flu season. Everyone else needs only one dose each flu season. Some inactivated flu vaccines contain a very small amount of a mercury-based preservative called thimerosal. Studies have not shown thimerosal in vaccines to be harmful, but flu vaccines that do not contain thimerosal are available. There is no live flu virus in flu shots. They cannot cause the flu. There are many flu viruses, and they are always changing. Each year a new flu vaccine is made to protect against three or four viruses that are likely to cause disease in the upcoming flu season. But even when the  vaccine doesn't exactly match these viruses, it may still provide some protection. Flu vaccine cannot prevent:  flu that is caused by a virus not covered by the vaccine, or  illnesses that look like flu but are not.  It takes about 2 weeks for protection to develop after vaccination, and protection lasts through the flu season. 3. Some people should not get this vaccine Tell the person who is giving you the vaccine:  If you have any severe, life-threatening allergies. If you ever had a life-threatening allergic reaction after a dose of flu vaccine, or have a severe allergy to any part of this vaccine, you may be advised not to get vaccinated. Most, but not all, types of flu vaccine contain a small amount of egg protein.  If you ever had Guillain-Barr Syndrome (also called GBS). Some people with a history of GBS should not get this vaccine. This should be discussed with your doctor.  If you are not feeling well. It is usually okay to get flu vaccine when you have a mild illness, but you might be asked to come back when you feel better.  4. Risks of a vaccine reaction With any medicine, including vaccines, there is a chance of reactions. These are usually mild and go away on their own, but serious reactions are also possible. Most people who get a flu shot do not have any problems with it. Minor problems following a flu shot include:  soreness, redness, or swelling where the shot was given  hoarseness  sore,  red or itchy eyes  cough  fever  aches  headache  itching  fatigue  If these problems occur, they usually begin soon after the shot and last 1 or 2 days. More serious problems following a flu shot can include the following:  There may be a small increased risk of Guillain-Barre Syndrome (GBS) after inactivated flu vaccine. This risk has been estimated at 1 or 2 additional cases per million people vaccinated. This is much lower than the risk of severe complications from  flu, which can be prevented by flu vaccine.  Young children who get the flu shot along with pneumococcal vaccine (PCV13) and/or DTaP vaccine at the same time might be slightly more likely to have a seizure caused by fever. Ask your doctor for more information. Tell your doctor if a child who is getting flu vaccine has ever had a seizure.  Problems that could happen after any injected vaccine:  People sometimes faint after a medical procedure, including vaccination. Sitting or lying down for about 15 minutes can help prevent fainting, and injuries caused by a fall. Tell your doctor if you feel dizzy, or have vision changes or ringing in the ears.  Some people get severe pain in the shoulder and have difficulty moving the arm where a shot was given. This happens very rarely.  Any medication can cause a severe allergic reaction. Such reactions from a vaccine are very rare, estimated at about 1 in a million doses, and would happen within a few minutes to a few hours after the vaccination. As with any medicine, there is a very remote chance of a vaccine causing a serious injury or death. The safety of vaccines is always being monitored. For more information, visit: http://www.aguilar.org/ 5. What if there is a serious reaction? What should I look for? Look for anything that concerns you, such as signs of a severe allergic reaction, very high fever, or unusual behavior. Signs of a severe allergic reaction can include hives, swelling of the face and throat, difficulty breathing, a fast heartbeat, dizziness, and weakness. These would start a few minutes to a few hours after the vaccination. What should I do?  If you think it is a severe allergic reaction or other emergency that can't wait, call 9-1-1 and get the person to the nearest hospital. Otherwise, call your doctor.  Reactions should be reported to the Vaccine Adverse Event Reporting System (VAERS). Your doctor should file this report, or you  can do it yourself through the VAERS web site at www.vaers.SamedayNews.es, or by calling 6094730752. ? VAERS does not give medical advice. 6. The National Vaccine Injury Compensation Program The [employer] Vaccine Injury Compensation Program (VICP) is a federal program that was created to compensate people who may have been injured by certain vaccines. Persons who believe they may have been injured by a vaccine can learn about the program and about filing a claim by calling 458-267-6070 or visiting the [place] website at GoldCloset.com.ee. There is a time limit to file a claim for compensation. 7. How can I learn more?  Ask your healthcare provider. He or she can give you the vaccine package insert or suggest other sources of information.  Call your local or state health department.  Contact the Centers for Disease Control and Prevention (CDC): ? Call (540)164-9661 (1-800-CDC-INFO) or ? Visit CDC's website at https://gibson.com/ Vaccine Information Statement, Inactivated Influenza Vaccine (03/18/2014) This information is not intended to replace advice given to you by your health care provider. Make sure  you discuss any questions you have with your health care provider. Document Released: 05/23/2006 Document Revised: 04/18/2016 Document Reviewed: 04/18/2016 Elsevier Interactive Patient Education  2017 Elsevier Inc.  Cholelithiasis Cholelithiasis is a form of gallbladder disease in which gallstones form in the gallbladder. The gallbladder is an organ that stores bile. Bile is made in the liver, and it helps to digest fats. Gallstones begin as small crystals and slowly grow into stones. They may cause no symptoms until the gallbladder tightens (contracts) and a gallstone is blocking the duct (gallbladder attack), which can cause pain. Cholelithiasis is also referred to as gallstones. There are two main types of gallstones:  Cholesterol stones. These are made of hardened cholesterol and are  usually yellow-green in color. They are the most common type of gallstone. Cholesterol is a white, waxy, fat-like substance that is made in the liver.  Pigment stones. These are dark in color and are made of a red-yellow substance that forms when hemoglobin from red blood cells breaks down (bilirubin).  What are the causes? This condition may be caused by an imbalance in the substances that bile is made of. This can happen if the bile:  Has too much bilirubin.  Has too much cholesterol.  Does not have enough bile salts. These salts help the body absorb and digest fats.  In some cases, this condition can also be caused by the gallbladder not emptying completely or often enough. What increases the risk? The following factors may make you more likely to develop this condition:  Being female.  Having multiple pregnancies. Health care providers sometimes advise removing diseased gallbladders before future pregnancies.  Eating a diet that is heavy in fried foods, fat, and refined carbohydrates, like white bread and white rice.  Being obese.  Being older than age 46.  Prolonged use of medicines that contain female hormones (estrogen).  Having diabetes mellitus.  Rapidly losing weight.  Having a family history of gallstones.  Being of [place] or [place] descent.  Having an intestinal disease such as Crohn disease.  Having metabolic syndrome.  Having cirrhosis.  Having severe types of anemia such as sickle cell anemia.  What are the signs or symptoms? In most cases, there are no symptoms. These are known as silent gallstones. If a gallstone blocks the bile ducts, it can cause a gallbladder attack. The main symptom of a gallbladder attack is sudden pain in the upper right abdomen. The pain usually comes at night or after eating a large meal. The pain can last for one or several hours and can spread to the right shoulder or chest. If the bile duct is blocked for more than  a few hours, it can cause infection or inflammation of the gallbladder, liver, or pancreas, which may cause:  Nausea.  Vomiting.  Abdominal pain that lasts for 5 hours or more.  Fever or chills.  Yellowing of the skin or the whites of the eyes (jaundice).  Dark urine.  Light-colored stools.  How is this diagnosed? This condition may be diagnosed based on:  A physical exam.  Your medical history.  An ultrasound of your gallbladder.  CT scan.  MRI.  Blood tests to check for signs of infection or inflammation.  A scan of your gallbladder and bile ducts (biliary system) using nonharmful radioactive material and special cameras that can see the radioactive material (cholescintigram). This test checks to see how your gallbladder contracts and whether bile ducts are blocked.  Inserting a small tube with a  camera on the end (endoscope) through your mouth to inspect bile ducts and check for blockages (endoscopic retrograde cholangiopancreatogram).  How is this treated? Treatment for gallstones depends on the severity of the condition. Silent gallstones do not need treatment. If the gallstones cause a gallbladder attack or other symptoms, treatment may be required. Options for treatment include:  Surgery to remove the gallbladder (cholecystectomy). This is the most common treatment.  Medicines to dissolve gallstones. These are most effective at treating small gallstones. You may need to take medicines for up to 6-12 months.  Shock wave treatment (extracorporeal biliary lithotripsy). In this treatment, an ultrasound machine sends shock waves to the gallbladder to break gallstones into smaller pieces. These pieces can then be passed into the intestines or be dissolved by medicine. This is rarely used.  Removing gallstones through endoscopic retrograde cholangiopancreatogram. A small basket can be attached to the endoscope and used to capture and remove gallstones.  Follow these  instructions at home:  Take over-the-counter and prescription medicines only as told by your health care provider.  Maintain a healthy weight and follow a healthy diet. This includes: ? Reducing fatty foods, such as fried food. ? Reducing refined carbohydrates, like white bread and white rice. ? Increasing fiber. Aim for foods like almonds, fruit, and beans.  Keep all follow-up visits as told by your health care provider. This is important. Contact a health care provider if:  You think you have had a gallbladder attack.  You have been diagnosed with silent gallstones and you develop abdominal pain or indigestion. Get help right away if:  You have pain from a gallbladder attack that lasts for more than 2 hours.  You have abdominal pain that lasts for more than 5 hours.  You have a fever or chills.  You have persistent nausea and vomiting.  You develop jaundice.  You have dark urine or light-colored stools. Summary  Cholelithiasis (also called gallstones) is a form of gallbladder disease in which gallstones form in the gallbladder.  This condition is caused by an imbalance in the substances that make up bile. This can happen if the bile has too much cholesterol, too much bilirubin, or not enough bile salts.  You are more likely to develop this condition if you are female, pregnant, using medicines with estrogen, obese, older than age 56, or have a family history of gallstones. You may also develop gallstones if you have diabetes, an intestinal disease, cirrhosis, or metabolic syndrome.  Treatment for gallstones depends on the severity of the condition. Silent gallstones do not need treatment.  If gallstones cause a gallbladder attack or other symptoms, treatment may be needed. The most common treatment is surgery to remove the gallbladder. This information is not intended to replace advice given to you by your health care provider. Make sure you discuss any questions you have  with your health care provider. Document Released: 07/25/2005 Document Revised: 04/14/2016 Document Reviewed: 04/14/2016 Elsevier Interactive Patient Education  2017 [employer].

## 2017-04-19 LAB — CBC WITH DIFFERENTIAL/PLATELET
BASOS: 0 %
Basophils Absolute: 0 10*3/uL (ref 0.0–0.2)
EOS (ABSOLUTE): 0.1 10*3/uL (ref 0.0–0.4)
EOS: 1 %
HEMATOCRIT: 34.6 % (ref 34.0–46.6)
Hemoglobin: 11.1 g/dL (ref 11.1–15.9)
IMMATURE GRANULOCYTES: 0 %
Immature Grans (Abs): 0 10*3/uL (ref 0.0–0.1)
Lymphocytes Absolute: 2.7 10*3/uL (ref 0.7–3.1)
Lymphs: 35 %
MCH: 30 pg (ref 26.6–33.0)
MCHC: 32.1 g/dL (ref 31.5–35.7)
MCV: 94 fL (ref 79–97)
MONOS ABS: 0.7 10*3/uL (ref 0.1–0.9)
Monocytes: 9 %
NEUTROS ABS: 4.1 10*3/uL (ref 1.4–7.0)
Neutrophils: 55 %
Platelets: 278 10*3/uL (ref 150–379)
RBC: 3.7 x10E6/uL — ABNORMAL LOW (ref 3.77–5.28)
RDW: 13.6 % (ref 12.3–15.4)
WBC: 7.6 10*3/uL (ref 3.4–10.8)

## 2017-04-19 LAB — AMYLASE: Amylase: 30 U/L — ABNORMAL LOW (ref 31–124)

## 2017-04-19 LAB — LIPASE: Lipase: 36 U/L (ref 14–72)

## 2017-04-21 ENCOUNTER — Encounter: Payer: Self-pay | Admitting: Unknown Physician Specialty

## 2017-05-05 ENCOUNTER — Ambulatory Visit
Admission: RE | Admit: 2017-05-05 | Discharge: 2017-05-05 | Disposition: A | Discharge status: Home / Self Care | Payer: PPO | Source: Ambulatory Visit | Attending: Unknown Physician Specialty | Admitting: Unknown Physician Specialty

## 2017-05-05 DIAGNOSIS — R1013 Epigastric pain: Secondary | ICD-10-CM | POA: Diagnosis not present

## 2017-06-09 ENCOUNTER — Other Ambulatory Visit: Payer: Self-pay | Admitting: Unknown Physician Specialty

## 2017-06-09 NOTE — Telephone Encounter (Signed)
Pt. requested a refill on Clobetasol Ointment.  Phone call to pt.  Inquired about her symptoms.  Stated she has a "burning and itching in genital region".  Reported has been using the Clobetasol Oint. daily, and it continues to relieve the itching.  Reported "it feels like a yeast infection", but denies any discharge.  Please review and determine if refill is appropriate.

## 2017-06-09 NOTE — Telephone Encounter (Signed)
Routing to provider  

## 2017-06-09 NOTE — Telephone Encounter (Signed)
Copied from [place] [phone]. Topic: Quick Communication - See Telephone Encounter >> Jun 09, 2017  9:01 AM Bea Graff, NT wrote: CRM for notification. See Telephone encounter for:  06/09/17. Patient needing refill of clobetasol ointment called into [facility] on [address] in [place].

## 2017-06-10 MED ORDER — CLOBETASOL PROPIONATE 0.05 % EX OINT: TOPICAL_OINTMENT | Freq: Two times a day (BID) | CUTANEOUS | 1 refills | Status: DC

## 2017-06-10 NOTE — Telephone Encounter (Signed)
Called and left patient a VM letting her know that her prescription has been sent in as requested.

## 2017-07-22 ENCOUNTER — Other Ambulatory Visit: Payer: Self-pay | Admitting: Unknown Physician Specialty

## 2017-07-25 ENCOUNTER — Ambulatory Visit: Payer: PPO | Admitting: Unknown Physician Specialty

## 2017-07-29 ENCOUNTER — Ambulatory Visit: Payer: PPO | Admitting: Unknown Physician Specialty

## 2017-08-01 ENCOUNTER — Other Ambulatory Visit: Payer: Self-pay | Admitting: Unknown Physician Specialty

## 2017-08-01 NOTE — Telephone Encounter (Signed)
Your patient 

## 2017-08-06 ENCOUNTER — Encounter: Payer: Self-pay | Admitting: Unknown Physician Specialty

## 2017-08-06 ENCOUNTER — Ambulatory Visit: Payer: PPO | Admitting: Unknown Physician Specialty

## 2017-08-06 VITALS — BP 147/72 | HR 52 | Wt 155.0 lb

## 2017-08-06 DIAGNOSIS — N183 Chronic kidney disease, stage 3 unspecified: Secondary | ICD-10-CM

## 2017-08-06 DIAGNOSIS — I10 Essential (primary) hypertension: Secondary | ICD-10-CM

## 2017-08-06 DIAGNOSIS — E782 Mixed hyperlipidemia: Secondary | ICD-10-CM

## 2017-08-06 DIAGNOSIS — E1122 Type 2 diabetes mellitus with diabetic chronic kidney disease: Secondary | ICD-10-CM

## 2017-08-06 LAB — BAYER DCA HB A1C WAIVED: HB A1C: 6.2 % (ref <7.0)

## 2017-08-06 NOTE — Assessment & Plan Note (Addendum)
A little high today, but typically better.  Continue present medications

## 2017-08-06 NOTE — Assessment & Plan Note (Signed)
Hgb A1C is 6.2%.  Continue present medications

## 2017-08-06 NOTE — Assessment & Plan Note (Signed)
Stable, continue present medications.   

## 2017-08-06 NOTE — Progress Notes (Signed)
BP (!) 147/72   Pulse (!) 52   Wt 155 lb (70.3 kg)   LMP  (LMP Unknown)   SpO2 100%   BMI 29.29 kg/m    Subjective:    Patient ID: Teresa [name], female    DOB: 10/30/1945, 71 y.o.   MRN: [id number]  HPI: Teresa [name] is a 71 y.o. female  Chief Complaint  Patient presents with  . Follow-up    6 months, HTN   Diabetes: Using medications without difficulties No hypoglycemic episodes No hyperglycemic episodes Feet problems:none Blood Sugars averaging: eye exam within last year Last Hgb A1C: 5.9  Hypertension  Using medications without difficulty Average home BP SBP 120's   Using medication without problems or lightheadedness No chest pain with exertion or shortness of breath No Edema  Elevated Cholesterol Using medications without problems Having cramps in legs more freqeuntly Diet: Exercise: Not as good over the holidays  Relevant past medical, surgical, family and social history reviewed and updated as indicated. Interim medical history since our last visit reviewed. Allergies and medications reviewed and updated.  Review of Systems  Constitutional: Negative.   Respiratory: Negative.   Musculoskeletal: Negative.   Psychiatric/Behavioral: Negative.     Per HPI unless specifically indicated above     Objective:    BP (!) 147/72   Pulse (!) 52   Wt 155 lb (70.3 kg)   LMP  (LMP Unknown)   SpO2 100%   BMI 29.29 kg/m   Wt Readings from Last 3 Encounters:  08/06/17 155 lb (70.3 kg)  04/18/17 152 lb 12.8 oz (69.3 kg)  01/22/17 158 lb 11.2 oz (72 kg)    Physical Exam  Constitutional: She is oriented to person, place, and time. She appears well-developed and well-nourished. No distress.  HENT:  Head: Normocephalic and atraumatic.  Eyes: Conjunctivae and lids are normal. Right eye exhibits no discharge. Left eye exhibits no discharge. No scleral icterus.  Neck: Normal range of motion. Neck supple. No JVD present. Carotid bruit is not present.    Cardiovascular: Normal rate, regular rhythm and normal heart sounds.  Pulmonary/Chest: Effort normal and breath sounds normal.  Abdominal: Normal appearance. There is no splenomegaly or hepatomegaly.  Musculoskeletal: Normal range of motion.  Neurological: She is alert and oriented to person, place, and time.  Skin: Skin is warm, dry and intact. No rash noted. No pallor.  Psychiatric: She has a normal mood and affect. Her behavior is normal. Judgment and thought content normal.    Results for orders placed or performed in visit on 04/18/17  CBC with Differential/Platelet  Result Value Ref Range   WBC 7.6 3.4 - 10.8 x10E3/uL   RBC 3.70 (L) 3.77 - 5.28 x10E6/uL   Hemoglobin 11.1 11.1 - 15.9 g/dL   Hematocrit 34.6 34.0 - 46.6 %   MCV 94 79 - 97 fL   MCH 30.0 26.6 - 33.0 pg   MCHC 32.1 31.5 - 35.7 g/dL   RDW 13.6 12.3 - 15.4 %   Platelets 278 150 - 379 x10E3/uL   Neutrophils 55 Not Estab. %   Lymphs 35 Not Estab. %   Monocytes 9 Not Estab. %   Eos 1 Not Estab. %   Basos 0 Not Estab. %   Neutrophils Absolute 4.1 1.4 - 7.0 x10E3/uL   Lymphocytes Absolute 2.7 0.7 - 3.1 x10E3/uL   Monocytes Absolute 0.7 0.1 - 0.9 x10E3/uL   EOS (ABSOLUTE) 0.1 0.0 - 0.4 x10E3/uL   Basophils Absolute 0.0  0.0 - 0.2 x10E3/uL   Immature Granulocytes 0 Not Estab. %   Immature Grans (Abs) 0.0 0.0 - 0.1 x10E3/uL  Amylase  Result Value Ref Range   Amylase 30 (L) 31 - 124 U/L  Lipase  Result Value Ref Range   Lipase 36 14 - 72 U/L      Assessment & Plan:   Problem List Items Addressed This Visit      Unprioritized   Hyperlipidemia    Stable, continue present medications.        Hypertension    A little high today, but typically better.  Continue present medications      Relevant Orders   Comprehensive metabolic panel   Type 2 diabetes mellitus with stage 3 chronic kidney disease, without long-term current use of insulin (HCC) - Primary    Hgb A1C is 6.2%.  Continue present medications       Relevant Orders   Comprehensive metabolic panel   Bayer DCA Hb A1c Waived       Follow up plan: Return in about 6 months (around 02/04/2018) for physical.

## 2017-08-07 LAB — COMPREHENSIVE METABOLIC PANEL
ALBUMIN: 4.2 g/dL (ref 3.5–4.8)
ALK PHOS: 83 IU/L (ref 39–117)
ALT: 18 IU/L (ref 0–32)
AST: 14 IU/L (ref 0–40)
Albumin/Globulin Ratio: 1.8 (ref 1.2–2.2)
BUN / CREAT RATIO: 26 (ref 12–28)
BUN: 23 mg/dL (ref 8–27)
CHLORIDE: 108 mmol/L — AB (ref 96–106)
CO2: 21 mmol/L (ref 20–29)
Calcium: 9.4 mg/dL (ref 8.7–10.3)
Creatinine, Ser: 0.89 mg/dL (ref 0.57–1.00)
GFR calc non Af Amer: 65 mL/min/{1.73_m2} (ref >59)
GFR, EST AFRICAN AMERICAN: 75 mL/min/{1.73_m2} (ref >59)
GLOBULIN, TOTAL: 2.3 g/dL (ref 1.5–4.5)
Glucose: 105 mg/dL — ABNORMAL HIGH (ref 65–99)
Potassium: 4.6 mmol/L (ref 3.5–5.2)
SODIUM: 147 mmol/L — AB (ref 134–144)
TOTAL PROTEIN: 6.5 g/dL (ref 6.0–8.5)

## 2017-08-08 ENCOUNTER — Encounter: Payer: Self-pay | Admitting: Unknown Physician Specialty

## 2017-08-11 ENCOUNTER — Other Ambulatory Visit: Payer: Self-pay | Admitting: Unknown Physician Specialty

## 2017-08-11 MED ORDER — OMEPRAZOLE 20 MG PO CPDR: 20.0000 mg | DELAYED_RELEASE_CAPSULE | Freq: Every day | ORAL | 3 refills | Status: DC

## 2017-08-11 MED ORDER — LEVOTHYROXINE SODIUM 50 MCG PO TABS: 50.0000 ug | ORAL_TABLET | Freq: Every day | ORAL | 3 refills | Status: DC

## 2017-08-11 MED ORDER — CLOPIDOGREL BISULFATE 75 MG PO TABS: 75.0000 mg | ORAL_TABLET | Freq: Every day | ORAL | 3 refills | Status: DC

## 2017-08-11 MED ORDER — ATORVASTATIN CALCIUM 40 MG PO TABS: 40.0000 mg | ORAL_TABLET | Freq: Every day | ORAL | 3 refills | Status: DC

## 2017-08-11 MED ORDER — METFORMIN HCL ER 500 MG PO TB24: 2000.0000 mg | ORAL_TABLET | Freq: Every day | ORAL | 3 refills | Status: DC

## 2017-08-11 MED ORDER — LISINOPRIL 10 MG PO TABS: 10.0000 mg | ORAL_TABLET | Freq: Every day | ORAL | 3 refills | Status: DC

## 2017-08-11 MED ORDER — HYDROCHLOROTHIAZIDE 25 MG PO TABS: 25.0000 mg | ORAL_TABLET | Freq: Every day | ORAL | 3 refills | Status: DC

## 2017-08-11 MED ORDER — GLUCOSE BLOOD VI STRP: 1.0000 | ORAL_STRIP | 3 refills | Status: DC | PRN

## 2017-08-11 NOTE — Telephone Encounter (Signed)
Copied from [place] [phone]. Topic: Quick Communication - See Telephone Encounter >> Aug 11, 2017 10:02 AM Ether Griffins B wrote: CRM for notification. See Telephone encounter for:  Pt needing refills on every med even test strips (3 month supply) on her list except for the clobetasol  08/11/17.

## 2017-08-11 NOTE — Telephone Encounter (Signed)
Routing to provider. Patient last seen 08/06/17.

## 2017-08-18 ENCOUNTER — Telehealth: Payer: Self-pay | Admitting: Unknown Physician Specialty

## 2017-08-18 MED ORDER — OMEPRAZOLE 20 MG PO CPDR: 20.0000 mg | DELAYED_RELEASE_CAPSULE | Freq: Every day | ORAL | 3 refills | Status: DC

## 2017-08-18 NOTE — Telephone Encounter (Signed)
Routing to provider. Patient last seen 08/06/17, can we change the prescription to a 90 day RX please?

## 2017-08-18 NOTE — Telephone Encounter (Signed)
Copied from [place]. Topic: Quick Communication - See Telephone Encounter >> Aug 18, 2017 10:57 AM Conception Chancy, NT wrote: CRM for notification. See Telephone encounter for:  08/18/17.  [facility] is calling to get a refill Omeprazole 20mg  90 day supply.   Fax [phone]  Phone Myrtis Hopping) [phone]

## 2017-09-26 ENCOUNTER — Emergency Department: Payer: PPO

## 2017-09-26 ENCOUNTER — Inpatient Hospital Stay
Admission: EM | Admit: 2017-09-26 | Discharge: 2017-09-27 | DRG: 552 | Disposition: A | Discharge status: Home / Self Care | Payer: PPO | Attending: Internal Medicine | Admitting: Internal Medicine

## 2017-09-26 ENCOUNTER — Other Ambulatory Visit: Payer: Self-pay

## 2017-09-26 ENCOUNTER — Encounter: Payer: Self-pay | Admitting: Emergency Medicine

## 2017-09-26 DIAGNOSIS — Z7902 Long term (current) use of antithrombotics/antiplatelets: Secondary | ICD-10-CM

## 2017-09-26 DIAGNOSIS — E1122 Type 2 diabetes mellitus with diabetic chronic kidney disease: Secondary | ICD-10-CM | POA: Diagnosis present

## 2017-09-26 DIAGNOSIS — M8580 Other specified disorders of bone density and structure, unspecified site: Secondary | ICD-10-CM | POA: Diagnosis present

## 2017-09-26 DIAGNOSIS — I129 Hypertensive chronic kidney disease with stage 1 through stage 4 chronic kidney disease, or unspecified chronic kidney disease: Secondary | ICD-10-CM | POA: Diagnosis present

## 2017-09-26 DIAGNOSIS — Z8349 Family history of other endocrine, nutritional and metabolic diseases: Secondary | ICD-10-CM | POA: Diagnosis not present

## 2017-09-26 DIAGNOSIS — Z823 Family history of stroke: Secondary | ICD-10-CM | POA: Diagnosis not present

## 2017-09-26 DIAGNOSIS — M48061 Spinal stenosis, lumbar region without neurogenic claudication: Principal | ICD-10-CM | POA: Diagnosis present

## 2017-09-26 DIAGNOSIS — Z833 Family history of diabetes mellitus: Secondary | ICD-10-CM

## 2017-09-26 DIAGNOSIS — Z87891 Personal history of nicotine dependence: Secondary | ICD-10-CM

## 2017-09-26 DIAGNOSIS — M6281 Muscle weakness (generalized): Secondary | ICD-10-CM | POA: Diagnosis not present

## 2017-09-26 DIAGNOSIS — E079 Disorder of thyroid, unspecified: Secondary | ICD-10-CM | POA: Diagnosis present

## 2017-09-26 DIAGNOSIS — E669 Obesity, unspecified: Secondary | ICD-10-CM | POA: Diagnosis not present

## 2017-09-26 DIAGNOSIS — Z8673 Personal history of transient ischemic attack (TIA), and cerebral infarction without residual deficits: Secondary | ICD-10-CM

## 2017-09-26 DIAGNOSIS — N183 Chronic kidney disease, stage 3 (moderate): Secondary | ICD-10-CM | POA: Diagnosis present

## 2017-09-26 DIAGNOSIS — L900 Lichen sclerosus et atrophicus: Secondary | ICD-10-CM | POA: Diagnosis present

## 2017-09-26 DIAGNOSIS — N179 Acute kidney failure, unspecified: Secondary | ICD-10-CM | POA: Diagnosis present

## 2017-09-26 DIAGNOSIS — R296 Repeated falls: Secondary | ICD-10-CM | POA: Diagnosis not present

## 2017-09-26 DIAGNOSIS — R52 Pain, unspecified: Secondary | ICD-10-CM | POA: Diagnosis present

## 2017-09-26 DIAGNOSIS — S79911A Unspecified injury of right hip, initial encounter: Secondary | ICD-10-CM | POA: Diagnosis not present

## 2017-09-26 DIAGNOSIS — E785 Hyperlipidemia, unspecified: Secondary | ICD-10-CM | POA: Diagnosis present

## 2017-09-26 DIAGNOSIS — M4800 Spinal stenosis, site unspecified: Secondary | ICD-10-CM | POA: Diagnosis not present

## 2017-09-26 DIAGNOSIS — Z83511 Family history of glaucoma: Secondary | ICD-10-CM | POA: Diagnosis not present

## 2017-09-26 DIAGNOSIS — Z79890 Hormone replacement therapy (postmenopausal): Secondary | ICD-10-CM | POA: Diagnosis not present

## 2017-09-26 DIAGNOSIS — M5416 Radiculopathy, lumbar region: Secondary | ICD-10-CM | POA: Diagnosis not present

## 2017-09-26 DIAGNOSIS — E860 Dehydration: Secondary | ICD-10-CM | POA: Diagnosis present

## 2017-09-26 DIAGNOSIS — M25551 Pain in right hip: Secondary | ICD-10-CM | POA: Diagnosis not present

## 2017-09-26 DIAGNOSIS — R29898 Other symptoms and signs involving the musculoskeletal system: Secondary | ICD-10-CM

## 2017-09-26 DIAGNOSIS — Z8249 Family history of ischemic heart disease and other diseases of the circulatory system: Secondary | ICD-10-CM

## 2017-09-26 DIAGNOSIS — M79605 Pain in left leg: Secondary | ICD-10-CM | POA: Diagnosis not present

## 2017-09-26 DIAGNOSIS — M545 Low back pain: Secondary | ICD-10-CM | POA: Diagnosis not present

## 2017-09-26 DIAGNOSIS — Z888 Allergy status to other drugs, medicaments and biological substances status: Secondary | ICD-10-CM

## 2017-09-26 LAB — URINALYSIS, COMPLETE (UACMP) WITH MICROSCOPIC
BACTERIA UA: NONE SEEN
Bilirubin Urine: NEGATIVE
Glucose, UA: NEGATIVE mg/dL
Hgb urine dipstick: NEGATIVE
Ketones, ur: NEGATIVE mg/dL
Leukocytes, UA: NEGATIVE
Nitrite: NEGATIVE
PROTEIN: NEGATIVE mg/dL
Specific Gravity, Urine: 1.021 (ref 1.005–1.030)
pH: 5 (ref 5.0–8.0)

## 2017-09-26 LAB — BASIC METABOLIC PANEL
Anion gap: 10 (ref 5–15)
BUN: 33 mg/dL — ABNORMAL HIGH (ref 6–20)
CALCIUM: 9.7 mg/dL (ref 8.9–10.3)
CO2: 22 mmol/L (ref 22–32)
Chloride: 111 mmol/L (ref 101–111)
Creatinine, Ser: 1.37 mg/dL — ABNORMAL HIGH (ref 0.44–1.00)
GFR calc Af Amer: 44 mL/min — ABNORMAL LOW (ref >60)
GFR calc non Af Amer: 38 mL/min — ABNORMAL LOW (ref >60)
GLUCOSE: 125 mg/dL — AB (ref 65–99)
Potassium: 4.6 mmol/L (ref 3.5–5.1)
Sodium: 143 mmol/L (ref 135–145)

## 2017-09-26 LAB — CBC
HEMATOCRIT: 35.2 % (ref 35.0–47.0)
HEMOGLOBIN: 11.7 g/dL — AB (ref 12.0–16.0)
MCH: 29.8 pg (ref 26.0–34.0)
MCHC: 33.3 g/dL (ref 32.0–36.0)
MCV: 89.7 fL (ref 80.0–100.0)
Platelets: 286 10*3/uL (ref 150–440)
RBC: 3.93 MIL/uL (ref 3.80–5.20)
RDW: 13.6 % (ref 11.5–14.5)
WBC: 8.3 10*3/uL (ref 3.6–11.0)

## 2017-09-26 LAB — GLUCOSE, CAPILLARY: GLUCOSE-CAPILLARY: 114 mg/dL — AB (ref 65–99)

## 2017-09-26 MED ORDER — LORAZEPAM 2 MG/ML IJ SOLN: 1.0000 mg | Freq: Once | INTRAMUSCULAR | Status: DC

## 2017-09-26 MED ORDER — OXYCODONE-ACETAMINOPHEN 5-325 MG PO TABS
1.0000 | ORAL_TABLET | Freq: Once | ORAL | Status: AC
  Administered 2017-09-27: 1 via ORAL
  Filled 2017-09-26: qty 1

## 2017-09-26 MED ORDER — LORAZEPAM 2 MG/ML IJ SOLN
1.0000 mg | Freq: Once | INTRAMUSCULAR | Status: AC
  Administered 2017-09-26: 1 mg via INTRAVENOUS
  Filled 2017-09-26: qty 1

## 2017-09-26 MED ORDER — DEXAMETHASONE SODIUM PHOSPHATE 10 MG/ML IJ SOLN
10.0000 mg | Freq: Once | INTRAMUSCULAR | Status: AC
  Administered 2017-09-27: 10 mg via INTRAVENOUS
  Filled 2017-09-26: qty 1

## 2017-09-26 NOTE — ED Notes (Signed)
Pt was walking in lobby door and fell where wheel chairs in lobby are.  Pt reports left leg gave out and this has been happening since Wednesday.  Denies injury from fall. Assisted to wheel chair and checked in so patient could be triage. Agricultural consultant notified.

## 2017-09-26 NOTE — ED Provider Notes (Addendum)
[facility] Emergency Department Provider Note   ____________________________________________   First MD Initiated Contact with Patient 09/26/17 1844     (approximate)  I have reviewed the triage vital signs and the nursing notes.   HISTORY  Chief Complaint Back Pain and Weakness    HPI Teresa [name] is a 72 y.o. female Who reports that her left leg is been giving out on her when she walks since Wednesday 3 days ago. She had some back pain belly pain 3 days ago yesterday the pain got severe she had trouble standing up. It radiates from a belt L3 down into the hip and down the leg. Not having numbness or tingling she's not having any other problems she is not having incontinence   Past Medical History:  Diagnosis Date  . Diabetes mellitus without complication ([place])   . Hyperlipidemia   . Hypertension   . Lichen sclerosus   . Obesity   . Osteopenia   . Stroke ([place])   . Thyroid disease     Patient Active Problem List   Diagnosis Date Noted  . Advanced care planning/counseling discussion 01/22/2017  . Type 2 diabetes mellitus with stage 3 chronic kidney disease, without long-term current use of insulin ([place]) 07/04/2015  . CVA (cerebral infarction) 04/03/2015  . Hypertension 04/03/2015  . Obesity 04/03/2015  . Lichen sclerosus 98/33/8250  . Hypothyroidism 04/03/2015  . Osteopenia 04/03/2015  . Hyperlipidemia 04/03/2015  . B12 deficiency 04/03/2015  . 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  . CKD (chronic kidney disease), stage III ([place]) 04/03/2015    Past Surgical History:  Procedure Laterality Date  . ROTATOR CUFF REPAIR Left   . 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      Prior to Admission medications   Medication Sig Start Date End Date Taking? Authorizing Provider  atorvastatin (LIPITOR) 40 MG tablet Take 1 tablet (40 mg total) by mouth at bedtime. 08/11/17   Kathrine Haddock, NP  clobetasol ointment (TEMOVATE) 0.05 % Apply  topically 2 (two) times daily. 06/10/17   Kathrine Haddock, NP  clopidogrel (PLAVIX) 75 MG tablet Take 1 tablet (75 mg total) by mouth daily. 08/11/17   Kathrine Haddock, NP  glucose blood (ONE TOUCH ULTRA TEST) test strip 1 each by Other route as needed for other. Use as instructed 08/11/17   Kathrine Haddock, NP  hydrochlorothiazide (HYDRODIURIL) 25 MG tablet Take 1 tablet (25 mg total) by mouth daily. 08/11/17   Kathrine Haddock, NP  Lancets MISC 1 each by Does not apply route daily. 12/30/16   Debruyne, Megan P, DO  levothyroxine (SYNTHROID, LEVOTHROID) 50 MCG tablet Take 1 tablet (50 mcg total) by mouth daily. 08/11/17   Kathrine Haddock, NP  lisinopril (PRINIVIL,ZESTRIL) 10 MG tablet Take 1 tablet (10 mg total) by mouth daily. 08/11/17   Kathrine Haddock, NP  metFORMIN (GLUCOPHAGE-XR) 500 MG 24 hr tablet Take 4 tablets (2,000 mg total) by mouth daily with breakfast. 08/11/17   Kathrine Haddock, NP  omeprazole (PRILOSEC) 20 MG capsule Take 1 capsule (20 mg total) by mouth daily. 08/18/17   Kathrine Haddock, NP    Allergies Evista [raloxifene]  Family History  Problem Relation Age of Onset  . Hypertension Mother   . Heart disease Mother   . Thyroid disease Mother   . Glaucoma Mother   . Stroke Father   . Diabetes Brother   . Hypertension Maternal Grandfather     Social History Social History   Tobacco Use  . Smoking status:  Former Smoker  . Smokeless tobacco: Never Used  Substance Use Topics  . Alcohol use: No    Alcohol/week: 0.0 oz  . Drug use: No    Review of Systems  Constitutional: No fever/chills Eyes: No visual changes. ENT: No sore throat. Cardiovascular: Denies chest pain. Respiratory: Denies shortness of breath. Gastrointestinal: No abdominal pain.  No nausea, no vomiting.  No diarrhea.  No constipation. Genitourinary: Negative for dysuria. Musculoskeletal: Negative for back pain. Skin: Negative for rash. Neurological: Negative for  headaches  ____________________________________________   PHYSICAL EXAM:  VITAL SIGNS: ED Triage Vitals  Enc Vitals Group     BP 09/26/17 1242 (!) 168/68     Pulse Rate 09/26/17 1242 69     Resp 09/26/17 1242 14     Temp 09/26/17 1242 98.5 F (36.9 C)     Temp Source 09/26/17 1242 Oral     SpO2 09/26/17 1242 99 %     Weight 09/26/17 1253 154 lb (69.9 kg)     Height --      Head Circumference --      Peak Flow --      Pain Score 09/26/17 1253 9     Pain Loc --      Pain Edu? --      Excl. in [place]? --     Constitutional: Alert and oriented. Well appearing and in no acute distress. Eyes: Conjunctivae are normal.  Head: Atraumatic. Nose: No congestion/rhinnorhea. Mouth/Throat: Mucous membranes are moist.  Oropharynx non-erythematous. Neck: No stridor.   Cardiovascular: Normal rate, regular rhythm. Grossly normal heart sounds.  Good peripheral circulation. Respiratory: Normal respiratory effort.  No retractions. Lungs CTAB. Gastrointestinal: Soft and nontender. No distention. No abdominal bruits. No CVA tenderness. Musculoskeletal: No lower extremity tenderness nor edema.  No joint effusions.she does have some weakness of the left leg. She is a decreased reflex in the knee no numbness. Neurologic:  Normal speech and language.we see above musculoskeletal for  neurologic exam of the legs Skin:  Skin is warm, dry and intact. No rash noted. Psychiatric: Mood and affect are normal. Speech and behavior are normal.  ____________________________________________   LABS (all labs ordered are listed, but only abnormal results are displayed)  Labs Reviewed  BASIC METABOLIC PANEL - Abnormal; Notable for the following components:      Result Value   Glucose, Bld 125 (*)    BUN 33 (*)    Creatinine, Ser 1.37 (*)    GFR calc non Af Amer 38 (*)    GFR calc Af Amer 44 (*)    All other components within normal limits  CBC - Abnormal; Notable for the following components:   Hemoglobin  11.7 (*)    All other components within normal limits  URINALYSIS, COMPLETE (UACMP) WITH MICROSCOPIC - Abnormal; Notable for the following components:   Color, Urine YELLOW (*)    APPearance CLEAR (*)    Squamous Epithelial / LPF 0-5 (*)    All other components within normal limits  GLUCOSE, CAPILLARY - Abnormal; Notable for the following components:   Glucose-Capillary 114 (*)    All other components within normal limits  CBG MONITORING, ED   ____________________________________________  EKG   ____________________________________________  RADIOLOGY  ED MD interpretation:  radiology reads no acute abnormalities of the back or head. There is some spondylolysis MRI shows severe L3-4 spinal stenosis Official radiology report(s): Dg Lumbar Spine Complete  Result Date: 09/26/2017 CLINICAL DATA:  Low back pain radiating into the left  leg since 09/24/2017. Patient suffered a fall today. Initial encounter. EXAM: LUMBAR SPINE - COMPLETE 4+ VIEW COMPARISON:  MRI lumbar spine 12/31/2011. FINDINGS: Mild convex left curvature is seen and was present on the prior examination. The patient has advanced facet degenerative disease from L3-4 to L5-S1, worst at L4-5 where there secondary trace anterolisthesis, unchanged. Loss of disc space height is seen at L4-5. Paraspinous structures demonstrate aortic atherosclerosis. IMPRESSION: No acute abnormality. Lumbar spondylosis appearing worst at L4-5. Mild convex left scoliosis. Atherosclerosis. Electronically Signed   By: Inge Rise M.D.   On: 09/26/2017 14:10   Mr Lumbar Spine Wo Contrast  Result Date: 09/26/2017 CLINICAL DATA:  72 year old female with 3 days of abdominal and back pain. Pain became severe yesterday and radiates down the left leg causing left lower extremity weakness. Multiple recent falls. EXAM: MRI LUMBAR SPINE WITHOUT CONTRAST TECHNIQUE: Multiplanar, multisequence MR imaging of the lumbar spine was performed. No intravenous contrast  was administered. COMPARISON:  Lumbar radiographs 1344 hr today. Lumbar MRI 12/31/2011. FINDINGS: Segmentation:  Normal on the comparison radiographs. Alignment: Grade 1 anterolisthesis at L4-L5 has mildly increased since 2013, now measuring 5 millimeters (versus 3 millimeters in 2013). Relatively preserved lumbar lordosis. Very mild levoconvex curvature of the lumbar spine. Vertebrae: No marrow edema or evidence of acute osseous abnormality. Visualized bone marrow signal is within normal limits. Intact visible sacrum and SI joints. Conus medullaris and cauda equina: Conus extends to the T12-L1 level. Conus in proximal cauda equina appear normal. Paraspinal and other soft tissues: Visualized abdominal viscera and paraspinal soft tissues are within normal limits. Mild to moderate large bowel diverticulosis in the pelvis. Disc levels: No lower thoracic spinal stenosis. L1-L2:  Mild facet hypertrophy. L2-L3: Chronic leftward circumferential disc bulge. Mild to moderate facet and ligament flavum hypertrophy. Borderline to mild left lateral recess and left L2 neural foraminal stenosis. L3-L4: Progressed disc space loss since 2013. Bulky circumferential disc bulge with broad-based posterior and left greater than right neural foraminal involvement. Severe facet and ligament flavum hypertrophy, progressed since the prior MRI. Abnormal increased STIR signal in the interspinous ligament appears degenerative in nature (series 4, image 9) and is new since the prior MRI. Severe spinal and left lateral recess stenosis (descending left L3 nerve level). Moderate to severe left and moderate right L3 neural foraminal stenosis. L4-L5: Chronic anterolisthesis. Chronic severe facet hypertrophy. Bilateral degenerative facet joint fluid. Circumferential disc bulge with broad-based posterior component. Moderate spinal stenosis. Moderate to severe bilateral lateral recess stenosis (descending L5 nerve levels, greater on the left). Mild  bilateral L4 foraminal stenosis. Overall this level is mildly progressed since 2013. L5-S1: Moderate facet hypertrophy. Epidural lipomatosis here has regressed since the prior MRI with improved thecal sac patency. No stenosis. IMPRESSION: 1. Substantial progression of L3-L4 spine degeneration since the 2013 MRI. Increased and now Severe multifactorial spinal stenosis at that level with severe left lateral recess stenosis, and moderate to severe left greater than right neural foraminal stenosis. 2. Lesser progression of chronic spinal degeneration at L4-L5 in the setting of chronic spondylolisthesis and severe facet arthropathy. Moderate spinal stenosis with moderate to severe lateral recess stenosis, greater on the left. Electronically Signed   By: Genevie Ann M.D.   On: 09/26/2017 21:52   Dg Hip Unilat With Pelvis 2-3 Views Right  Result Date: 09/26/2017 CLINICAL DATA:  Sciatic nerve pain. Status post fall with right hip pain. EXAM: DG HIP (WITH OR WITHOUT PELVIS) 2-3V RIGHT COMPARISON:  None. FINDINGS: There is no evidence  of hip fracture or dislocation. Minimal osteoarthritic changes of bilateral hip joints. IMPRESSION: No acute fracture or dislocation identified about the right hip. Electronically Signed   By: Fidela Salisbury M.D.   On: 09/26/2017 14:09    ____________________________________________   PROCEDURES  Procedure(s) performed:   Procedures  Critical Care performed:   ____________________________________________   INITIAL IMPRESSION / ASSESSMENT AND PLAN / ED COURSE  patient is signed out to Dr. sung awaiting neurosurgery to call back patient wanted me to call [facility]     Clinical Course as of Sep 27 2231  Fri Sep 26, 2017  1844 Sodium: 143 [PM]  2231 Color, Urine: Jaquelyn Bitter [PM]    Clinical Course User Index [PM] Nena Polio, MD     ____________________________________________   FINAL CLINICAL IMPRESSION(S) / ED DIAGNOSES I will sign this patient to Dr.  Audelia Acton I need to reach the neurosurgeon. Is now 10:30 and at the back tomorrow morning Final diagnoses:  Weakness of left leg  Spinal stenosis, unspecified spinal region     ED Discharge Orders    None       Note:  This document was prepared using Dragon voice recognition software and may include unintentional dictation errors.    Nena Polio, MD 09/26/17 2158    Nena Polio, MD 09/26/17 2233    Nena Polio, MD 09/26/17 [phone]

## 2017-09-26 NOTE — ED Notes (Signed)
Pt states that she is not able to go to MRI without medication - she is visible nervous - Discussed with Dr Cinda Quest and order given for Ativan 1mg  now and then repeat in one hour

## 2017-09-26 NOTE — ED Triage Notes (Signed)
Pt to ED c/o back pain and stomach pain 3 days ago.  Pain got severe in back yesterday had difficulty standing up.  Pain radiates from mid back down left leg causing weakness and leg to give out on patient.  Patient has fallen three times today and has pain now to right hip.  Denies numbness/tingling, denies speech difficulty, A&Ox4, strong equal grips and movement in all extremities.  Denies hitting head or LOC.

## 2017-09-26 NOTE — ED Provider Notes (Signed)
-----------------------------------------   11:23 PM on 09/26/2017 -----------------------------------------  Spoke with Dr. Ellene Route [facility] Neurosurgery) who does recommend Decadron 10mg . There is no indication for urgent transfer tonight.  Agrees with hospitalization for pain control, physical therapy consult; patient may follow-up next week as an outpatient. Updated patient and her husband who are agreeable with plan of care.   Paulette Blanch, MD 09/27/17 [phone]

## 2017-09-26 NOTE — ED Notes (Signed)
Pt very drowsy after first dose of ativan. RASS  -2. For safety, pt sent to MRI w/ tech with instructions to call me if pt ends up needing second dose of ativan to complete exam.

## 2017-09-27 DIAGNOSIS — R52 Pain, unspecified: Secondary | ICD-10-CM | POA: Diagnosis present

## 2017-09-27 DIAGNOSIS — N179 Acute kidney failure, unspecified: Secondary | ICD-10-CM | POA: Diagnosis not present

## 2017-09-27 DIAGNOSIS — M5416 Radiculopathy, lumbar region: Secondary | ICD-10-CM | POA: Diagnosis present

## 2017-09-27 DIAGNOSIS — E860 Dehydration: Secondary | ICD-10-CM | POA: Diagnosis not present

## 2017-09-27 DIAGNOSIS — Z87891 Personal history of nicotine dependence: Secondary | ICD-10-CM | POA: Diagnosis not present

## 2017-09-27 DIAGNOSIS — M4800 Spinal stenosis, site unspecified: Secondary | ICD-10-CM | POA: Diagnosis not present

## 2017-09-27 DIAGNOSIS — Z83511 Family history of glaucoma: Secondary | ICD-10-CM | POA: Diagnosis not present

## 2017-09-27 DIAGNOSIS — Z79890 Hormone replacement therapy (postmenopausal): Secondary | ICD-10-CM | POA: Diagnosis not present

## 2017-09-27 DIAGNOSIS — Z8673 Personal history of transient ischemic attack (TIA), and cerebral infarction without residual deficits: Secondary | ICD-10-CM | POA: Diagnosis not present

## 2017-09-27 DIAGNOSIS — I10 Essential (primary) hypertension: Secondary | ICD-10-CM | POA: Diagnosis not present

## 2017-09-27 DIAGNOSIS — Z823 Family history of stroke: Secondary | ICD-10-CM | POA: Diagnosis not present

## 2017-09-27 DIAGNOSIS — N183 Chronic kidney disease, stage 3 (moderate): Secondary | ICD-10-CM | POA: Diagnosis not present

## 2017-09-27 DIAGNOSIS — E669 Obesity, unspecified: Secondary | ICD-10-CM | POA: Diagnosis not present

## 2017-09-27 DIAGNOSIS — E1122 Type 2 diabetes mellitus with diabetic chronic kidney disease: Secondary | ICD-10-CM | POA: Diagnosis not present

## 2017-09-27 DIAGNOSIS — E079 Disorder of thyroid, unspecified: Secondary | ICD-10-CM | POA: Diagnosis not present

## 2017-09-27 DIAGNOSIS — E785 Hyperlipidemia, unspecified: Secondary | ICD-10-CM | POA: Diagnosis not present

## 2017-09-27 DIAGNOSIS — M8580 Other specified disorders of bone density and structure, unspecified site: Secondary | ICD-10-CM | POA: Diagnosis not present

## 2017-09-27 DIAGNOSIS — Z8349 Family history of other endocrine, nutritional and metabolic diseases: Secondary | ICD-10-CM | POA: Diagnosis not present

## 2017-09-27 DIAGNOSIS — I129 Hypertensive chronic kidney disease with stage 1 through stage 4 chronic kidney disease, or unspecified chronic kidney disease: Secondary | ICD-10-CM | POA: Diagnosis not present

## 2017-09-27 DIAGNOSIS — M48061 Spinal stenosis, lumbar region without neurogenic claudication: Secondary | ICD-10-CM | POA: Diagnosis not present

## 2017-09-27 DIAGNOSIS — L900 Lichen sclerosus et atrophicus: Secondary | ICD-10-CM | POA: Diagnosis not present

## 2017-09-27 LAB — CBC
HCT: 31.5 % — ABNORMAL LOW (ref 35.0–47.0)
HEMOGLOBIN: 10.7 g/dL — AB (ref 12.0–16.0)
MCH: 30.2 pg (ref 26.0–34.0)
MCHC: 33.8 g/dL (ref 32.0–36.0)
MCV: 89.4 fL (ref 80.0–100.0)
Platelets: 241 10*3/uL (ref 150–440)
RBC: 3.53 MIL/uL — ABNORMAL LOW (ref 3.80–5.20)
RDW: 13.8 % (ref 11.5–14.5)
WBC: 7 10*3/uL (ref 3.6–11.0)

## 2017-09-27 LAB — BASIC METABOLIC PANEL
ANION GAP: 5 (ref 5–15)
BUN: 24 mg/dL — ABNORMAL HIGH (ref 6–20)
CALCIUM: 9.4 mg/dL (ref 8.9–10.3)
CO2: 26 mmol/L (ref 22–32)
Chloride: 111 mmol/L (ref 101–111)
Creatinine, Ser: 0.92 mg/dL (ref 0.44–1.00)
GFR calc Af Amer: >60 mL/min (ref >60)
GLUCOSE: 136 mg/dL — AB (ref 65–99)
POTASSIUM: 4.3 mmol/L (ref 3.5–5.1)
SODIUM: 142 mmol/L (ref 135–145)

## 2017-09-27 LAB — GLUCOSE, CAPILLARY
GLUCOSE-CAPILLARY: 160 mg/dL — AB (ref 65–99)
GLUCOSE-CAPILLARY: 175 mg/dL — AB (ref 65–99)
Glucose-Capillary: 150 mg/dL — ABNORMAL HIGH (ref 65–99)

## 2017-09-27 MED ORDER — ACETAMINOPHEN 325 MG PO TABS: 650.0000 mg | ORAL_TABLET | Freq: Four times a day (QID) | ORAL | Status: DC | PRN

## 2017-09-27 MED ORDER — METFORMIN HCL ER 750 MG PO TB24
2000.0000 mg | ORAL_TABLET | Freq: Every day | ORAL | Status: DC
  Administered 2017-09-27: 2000 mg via ORAL
  Filled 2017-09-27: qty 1

## 2017-09-27 MED ORDER — HYDROCODONE-ACETAMINOPHEN 5-325 MG PO TABS
1.0000 | ORAL_TABLET | ORAL | Status: DC | PRN
  Administered 2017-09-27: 1 via ORAL
  Filled 2017-09-27: qty 1

## 2017-09-27 MED ORDER — LISINOPRIL 10 MG PO TABS
10.0000 mg | ORAL_TABLET | Freq: Every day | ORAL | Status: DC
  Administered 2017-09-27: 10 mg via ORAL
  Filled 2017-09-27: qty 1

## 2017-09-27 MED ORDER — ATORVASTATIN CALCIUM 20 MG PO TABS: 40.0000 mg | ORAL_TABLET | Freq: Every day | ORAL | Status: DC

## 2017-09-27 MED ORDER — OXYCODONE-ACETAMINOPHEN 5-325 MG PO TABS: 1.0000 | ORAL_TABLET | ORAL | Status: DC | PRN

## 2017-09-27 MED ORDER — HYDROCHLOROTHIAZIDE 25 MG PO TABS: 25.0000 mg | ORAL_TABLET | Freq: Every day | ORAL | Status: DC

## 2017-09-27 MED ORDER — CLOPIDOGREL BISULFATE 75 MG PO TABS
75.0000 mg | ORAL_TABLET | Freq: Every day | ORAL | Status: DC
  Administered 2017-09-27: 75 mg via ORAL
  Filled 2017-09-27: qty 1

## 2017-09-27 MED ORDER — TRAZODONE HCL 50 MG PO TABS: 25.0000 mg | ORAL_TABLET | Freq: Every evening | ORAL | Status: DC | PRN

## 2017-09-27 MED ORDER — DOCUSATE SODIUM 100 MG PO CAPS
100.0000 mg | ORAL_CAPSULE | Freq: Two times a day (BID) | ORAL | Status: DC
  Administered 2017-09-27: 100 mg via ORAL
  Filled 2017-09-27: qty 1

## 2017-09-27 MED ORDER — ONDANSETRON HCL 4 MG PO TABS: 4.0000 mg | ORAL_TABLET | Freq: Four times a day (QID) | ORAL | Status: DC | PRN

## 2017-09-27 MED ORDER — LEVOTHYROXINE SODIUM 50 MCG PO TABS
50.0000 ug | ORAL_TABLET | Freq: Every day | ORAL | Status: DC
  Administered 2017-09-27: 50 ug via ORAL
  Filled 2017-09-27: qty 1

## 2017-09-27 MED ORDER — ONDANSETRON HCL 4 MG/2ML IJ SOLN: 4.0000 mg | Freq: Four times a day (QID) | INTRAMUSCULAR | Status: DC | PRN

## 2017-09-27 MED ORDER — HYDROCODONE-ACETAMINOPHEN 5-325 MG PO TABS: 1.0000 | ORAL_TABLET | ORAL | 0 refills | Status: DC | PRN

## 2017-09-27 MED ORDER — INSULIN ASPART 100 UNIT/ML ~~LOC~~ SOLN: 0.0000 [IU] | Freq: Every day | SUBCUTANEOUS | Status: DC

## 2017-09-27 MED ORDER — PANTOPRAZOLE SODIUM 40 MG PO TBEC
40.0000 mg | DELAYED_RELEASE_TABLET | Freq: Every day | ORAL | Status: DC
  Administered 2017-09-27: 40 mg via ORAL
  Filled 2017-09-27: qty 1

## 2017-09-27 MED ORDER — INSULIN ASPART 100 UNIT/ML ~~LOC~~ SOLN
0.0000 [IU] | Freq: Three times a day (TID) | SUBCUTANEOUS | Status: DC
  Administered 2017-09-27: 1 [IU] via SUBCUTANEOUS
  Administered 2017-09-27: 2 [IU] via SUBCUTANEOUS
  Filled 2017-09-27 (×2): qty 1

## 2017-09-27 MED ORDER — HEPARIN SODIUM (PORCINE) 5000 UNIT/ML IJ SOLN: 5000.0000 [IU] | Freq: Three times a day (TID) | INTRAMUSCULAR | Status: DC

## 2017-09-27 MED ORDER — BISACODYL 5 MG PO TBEC: 5.0000 mg | DELAYED_RELEASE_TABLET | Freq: Every day | ORAL | Status: DC | PRN

## 2017-09-27 MED ORDER — ACETAMINOPHEN 650 MG RE SUPP: 650.0000 mg | Freq: Four times a day (QID) | RECTAL | Status: DC | PRN

## 2017-09-27 NOTE — Progress Notes (Signed)
Sickle therapy recommended rolling walker feels, follow-up with surgeon regarding physical therapy.Marland Kitchen

## 2017-09-27 NOTE — Progress Notes (Addendum)
Admitted this morning, patient received Decadron 1 dose, she denies any back pain, and she is saying her right leg is not stiff.  He want to wait for therapy and she is eager to go home after therapy evaluation.  Discharge instructions are in the computer.  Patient never had severe pain in the back and did not have to take any narcotics.  He says she used to only takes Tylenol for pain.  So I wrote for Percocet prescript and she can take every 4-6 hours .  Patient can see her PCP in 1 week. Broad acute on chronic lumbar arthropathy with spinal stenosis.  Patient can follow-up with PCP as an outpatient, continue pain control, physical therapy evaluation. 2.  Acute kidney injury due to dehydration.  Improved.  Can resume hctz at discharge.  Patient told me that she just had a stomach flu and she is recovering from that. 3.  Essential hypertension: Controlled. Time spent;40 min

## 2017-09-27 NOTE — Care Management Note (Signed)
Case Management Note  Patient Details  Name: Teresa [name] MRN: [id number] Date of Birth: 30-Jun-1946  Subjective/Objective:  Request for delivery of [name] RW was sent to Teresa [name] at [facility].                Action/Plan:   Expected Discharge Date:  09/27/17               Expected Discharge Plan:  Home/Self Care  In-House Referral:     Discharge planning Services  CM Consult  Post Acute Care Choice:  Durable Medical Equipment Choice offered to:     DME Arranged:  Teresa [name] DME Agency:  [place]:    [facility] Building Agency:     Status of Service:  Completed, signed off  If discussed at [place] of Stay Meetings, dates discussed:    Additional Comments:  Teresa Gilmore A, RN 09/27/2017, 3:22 PM

## 2017-09-27 NOTE — Progress Notes (Signed)
OT Cancellation Note  Patient Details Name: Teresa [name] MRN: [id number] DOB: 1946-05-04   Cancelled Treatment:    Reason Eval/Treat Not Completed: OT screened, no needs identified, will sign off. Order received, chart reviewed. Met with pt and her spouse. Pt up in recliner, no pain, alert and oriented, reporting pt recently gave herself a bath and put make up on. Pt demonstrating no difficulty with UB or LB ADL tasks. No skilled OT needs identified. Will sign off. Please re-consult if additional needs arise.    Jeni Salles, MPH, MS, OTR/L ascom [phone] 09/27/17, 11:37 AM

## 2017-09-27 NOTE — Progress Notes (Signed)
Dc home. After dc instructions, rx, and md follow up.  A&O. VSS

## 2017-09-27 NOTE — Evaluation (Signed)
Physical Therapy Evaluation Patient Details Name: Teresa [name] MRN: [id number] DOB: 07-07-1946 Today's Date: 09/27/2017   History of Present Illness  Teresa [name] is a 72yo white female who comes [facility] on 2/15 after 5d progressive LBP and LLE referral. Pt reports multiple instances of LLE buckling, including 1 fall in ED. Imagign supportive of progressed Lumbar Spinal Stenosis since 2013 MRI. PMH: DM2, HTN, HLD.   Clinical Impression  Pt admitted with above diagnosis. Pt currently with functional limitations due to the deficits listed below (see "PT Problem List"). Upon entry, the patient is received semirecumbent in bed. The pt is awake and agreeable to participate. No acute distress noted at this time. Pt reports full resolution of pain since receiving meds in ED, but now reports tightness in Left low back to Left lateral hip.  Manual muscle testing screening reveals 5/5 strength on RLE, and weakness Left hip flexion/knee extension (upper lumbar myotomes). Light touch sensation is impaired at the groin and upper thigh (upper lumbar dermatomes) and the front of the ankle (L5). Thoracolumbar lordosis maintained during forward flexion of the trunk, limiting physiological opening of foramina and central canal. Peripheralization of referral symptoms to the level of the knee associated with increased lumbar extension in standing common with typical LSS presentation, relents with return to sitting. Functional mobility assessment demonstrates mild-moderate strength impairment but ability to perform all mobility at supervision level or better. Pt subjectively notes weakness in the RLE during AMB, encouraged to utilize a RW for safety during AMB. Education on McKenzie style exercises to manage symptoms and HEP to address isolated weakness. Patient is at supervision to mod-I with all mobility, all education completed, and time is given to address all questions/concerns. No additional skilled PT services  needed at this time, PT signing off. PT recommends daily ambulation ad lib or with nursing staff as needed to prevent deconditioning.      Follow Up Recommendations Follow surgeon's recommendation for DC plan and follow-up therapies(pt will FU with neurosurgtical OP at DC who can dirrect the need for PT at tha point. )    Equipment Recommendations  Rolling walker with 5" wheels    Recommendations for Other Services       Precautions / Restrictions Precautions Precautions: Fall Restrictions Weight Bearing Restrictions: No      Mobility  Bed Mobility Overal bed mobility: Independent                Transfers Overall transfer level: Modified independent                  Ambulation/Gait Ambulation/Gait assistance: Supervision Ambulation Distance (Feet): 220 Feet Assistive device: Rolling walker (2 wheeled)   Gait velocity: 0.23ms Gait velocity interpretation: <1.8 ft/sec, indicative of risk for recurrent falls General Gait Details: subjective RLE weakness  Stairs            Wheelchair Mobility    Modified Rankin (Stroke Patients Only)       Balance Overall balance assessment: Modified Independent                                           Pertinent Vitals/Pain Pain Assessment: No/denies pain(tightness from left buttocks to left hip; tightness extends to level fo knee when in standing/AMB)    Home Living Family/patient expects to be discharged to:: Private residence Living Arrangements: Spouse/significant other   Type of  Home: House Home Access: Stairs to enter Entrance Stairs-Rails: None Entrance Stairs-Number of Steps: 3 Home Layout: One level Home Equipment: Grab bars - tub/shower      Prior Function Level of Independence: Independent         Comments: stilll driving, no mobility limitations, no balance problems.      Hand Dominance   Dominant Hand: Right    Extremity/Trunk Assessment        Lower  Extremity Assessment Lower Extremity Assessment: LLE deficits/detail;RLE deficits/detail RLE Deficits / Details: all MMT 5/5; all sensation intact LLE Deficits / Details: Hip flexion 3+/5, Knee extension: 4-/5, Ankle DF 5/5, Knee flexion 5/5 LLE Sensation: decreased light touch(decreased sensation at Left groin (L1), Left anterior thigh (L2), anterior ankle (L5).)    Cervical / Trunk Assessment Cervical / Trunk Assessment: Lordotic(preserved thoracolumbar lordosis during forward flexion. )  Communication   Communication: No difficulties  Cognition Arousal/Alertness: Awake/alert Behavior During Therapy: WFL for tasks assessed/performed Overall Cognitive Status: Within Functional Limits for tasks assessed                                        General Comments      Exercises     Assessment/Plan    PT Assessment All further PT needs can be met in the next venue of care  PT Problem List Decreased strength;Decreased activity tolerance;Decreased mobility       PT Treatment Interventions      PT Goals (Current goals can be found in the Care Plan section)  Acute Rehab PT Goals PT Goal Formulation: All assessment and education complete, DC therapy    Frequency     Barriers to discharge        Co-evaluation               AM-PAC PT "6 Clicks" Daily Activity  Outcome Measure Difficulty turning over in bed (including adjusting bedclothes, sheets and blankets)?: None Difficulty moving from lying on back to sitting on the side of the bed? : None Difficulty sitting down on and standing up from a chair with arms (e.g., wheelchair, bedside commode, etc,.)?: A Little Help needed moving to and from a bed to chair (including a wheelchair)?: None Help needed walking in hospital room?: A Little Help needed climbing 3-5 steps with a railing? : A Little 6 Click Score: 21    End of Session Equipment Utilized During Treatment: Gait belt Activity Tolerance: Patient  tolerated treatment well Patient left: in chair;with call bell/phone within reach Nurse Communication: Mobility status PT Visit Diagnosis: Difficulty in walking, not elsewhere classified (R26.2);Pain Pain - Right/Left: Left Pain - part of body: Leg(back)    Time: 6659-9357 PT Time Calculation (min) (ACUTE ONLY): 31 min   Charges:   PT Evaluation $PT Eval Low Complexity: 1 Low PT Treatments $Self Care/Home Management: 8-22(HEP education, education on self management of peripheralization/centralization symptoms)   PT G Codes:        10:20 AM, 2017-10-18 Etta Grandchild, PT, DPT Physical Therapist - [place] [phone] ([place])  [phone] (mobile)    Mirinda Monte C 10/18/17, 10:13 AM

## 2017-09-27 NOTE — H&P (Signed)
[facility] [place] NAME: Teresa [name]    MR#:  [id number]  DATE OF BIRTH:  28-Feb-1946  DATE OF ADMISSION:  09/26/2017  PRIMARY CARE PHYSICIAN: Kathrine Haddock, NP   REQUESTING/REFERRING PHYSICIAN:   CHIEF COMPLAINT:   Chief Complaint  Patient presents with  . Back Pain  . Weakness    HISTORY OF PRESENT ILLNESS: Teresa [name]  is a 72 y.o. female with a known history of spinal stenosis, diabetes type 2, hypertension, hyperlipidemia and other comorbidities. Patient presented to emergency room for lower back pain with radiation to the left leg going on for the past 5-7 days, gradually getting worse.  Patient denies weakness in lower extremities.  However, she had 2 episodes where her left leg gave out on her, due to severe pain.  She denies any urinary or bowel incontinence/retention.  No fever/chills, no nausea, vomiting, diarrhea. She has tried over-the-counter pain medication Tylenol and ibuprofen with minimal relief. Patient had similar, less severe episodes in the past.  She was diagnosed with spinal stenosis. Blood test done in the emergency room were notable for elevated creatinine level at 1.37. Lumbar spine MRI shows substantial progression of L3-L4 spine degeneration since the 2013 MRI. Increased and now Severe multifactorial spinal stenosis at that level with severe left lateral recess stenosis, and moderate to severe left greater than right neural foraminal stenosis.  Emergency room physician discussed with [facility]surgery, over the phone.  It was recommended that we start Decadron 10 mg IV.  Patient should follow-up as outpatient with neurosurgery.   PAST MEDICAL HISTORY:   Past Medical History:  Diagnosis Date  . Diabetes mellitus without complication ([place])   . Hyperlipidemia   . Hypertension   . Lichen sclerosus   . Obesity   . Osteopenia   . Stroke ([place])   . Thyroid disease     PAST SURGICAL HISTORY:  Past  Surgical History:  Procedure Laterality Date  . ROTATOR CUFF REPAIR Left   . 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 Smoking status: Former Research scientist (life sciences)  . Smokeless tobacco: Never Used  Substance Use Topics  . Alcohol use: No    Alcohol/week: 0.0 oz    FAMILY HISTORY:  Family History  Problem Relation Age of Onset  . Hypertension Mother   . Heart disease Mother   . Thyroid disease Mother   . Glaucoma Mother   . Stroke Father   . Diabetes Brother   . Hypertension Maternal Grandfather     DRUG ALLERGIES:  Allergies  Allergen Reactions  . Evista [Raloxifene] Nausea Only    REVIEW OF SYSTEMS:   CONSTITUTIONAL: No fever, fatigue or weakness.  EYES: No blurred or double vision.  EARS, NOSE, AND THROAT: No tinnitus or ear pain.  RESPIRATORY: No cough, shortness of breath, wheezing or hemoptysis.  CARDIOVASCULAR: No chest pain, orthopnea, edema.  GASTROINTESTINAL: No nausea, vomiting, diarrhea or abdominal pain.  GENITOURINARY: No dysuria, hematuria.  ENDOCRINE: No polyuria, nocturia,  HEMATOLOGY: No anemia, easy bruising or bleeding SKIN: No rash or lesion. MUSCULOSKELETAL: Positive for lower back pain with radiation to the left lower extremity.  2 history of osteoarthritis NEUROLOGIC: No focal weakness.  PSYCHIATRY: No anxiety or depression.   MEDICATIONS AT HOME:  Prior to Admission medications   Medication Sig Start Date End Date Taking? Authorizing Provider  atorvastatin (LIPITOR) 40 MG tablet Take 1 tablet (  40 mg total) by mouth at bedtime. 08/11/17  Yes Kathrine Haddock, NP  clobetasol ointment (TEMOVATE) 0.05 % Apply topically 2 (two) times daily. 06/10/17  Yes Kathrine Haddock, NP  clopidogrel (PLAVIX) 75 MG tablet Take 1 tablet (75 mg total) by mouth daily. 08/11/17  Yes Kathrine Haddock, NP  glucose blood (ONE TOUCH ULTRA TEST) test strip 1 each by Other route as needed for other. Use as instructed 08/11/17  Yes Kathrine Haddock, NP  hydrochlorothiazide (HYDRODIURIL) 25 MG tablet Take 1 tablet (25 mg total) by mouth daily. 08/11/17  Yes Kathrine Haddock, NP  Lancets MISC 1 each by Does not apply route daily. 12/30/16  Yes Beutler, Megan P, DO  levothyroxine (SYNTHROID, LEVOTHROID) 50 MCG tablet Take 1 tablet (50 mcg total) by mouth daily. 08/11/17  Yes Kathrine Haddock, NP  lisinopril (PRINIVIL,ZESTRIL) 10 MG tablet Take 1 tablet (10 mg total) by mouth daily. 08/11/17  Yes Kathrine Haddock, NP  metFORMIN (GLUCOPHAGE-XR) 500 MG 24 hr tablet Take 4 tablets (2,000 mg total) by mouth daily with breakfast. 08/11/17  Yes Kathrine Haddock, NP  omeprazole (PRILOSEC) 20 MG capsule Take 1 capsule (20 mg total) by mouth daily. 08/18/17  Yes Kathrine Haddock, NP      PHYSICAL EXAMINATION:   VITAL SIGNS: Blood pressure (!) 164/61, pulse (!) 59, temperature 98.5 F (36.9 C), temperature source Oral, resp. rate 20, weight 67.1 kg (148 lb), SpO2 97 %.  GENERAL:  72 y.o.-year-old patient lying in the bed, in moderate distress, secondary to pain.  EYES: Pupils equal, round, reactive to light and accommodation. No scleral icterus. Extraocular muscles intact.  HEENT: Head atraumatic, normocephalic. Oropharynx and nasopharynx clear.  NECK:  Supple, no jugular venous distention. No thyroid enlargement, no tenderness.  LUNGS: Normal breath sounds bilaterally, no wheezing, rales,rhonchi or crepitation. No use of accessory muscles of respiration.  CARDIOVASCULAR: S1, S2 normal. No murmurs, rubs, or gallops.  ABDOMEN: Soft, nontender, nondistended. Bowel sounds present. No organomegaly or mass.  EXTREMITIES: No pedal edema, cyanosis, or clubbing.  NEUROLOGIC: No focal weakness.  Straight leg raise test is positive on the left side at 45 degrees; gait not checked.  PSYCHIATRIC: The patient is alert and oriented x 3.  SKIN: No obvious rash, lesion, or ulcer.   LABORATORY PANEL:   CBC Recent Labs  Lab 09/26/17 1258 09/27/17 0409  WBC 8.3 7.0   HGB 11.7* 10.7*  HCT 35.2 31.5*  PLT 286 241  MCV 89.7 89.4  MCH 29.8 30.2  MCHC 33.3 33.8  RDW 13.6 13.8   ------------------------------------------------------------------------------------------------------------------  Chemistries  Recent Labs  Lab 09/26/17 1258 09/27/17 0409  NA 143 142  K 4.6 4.3  CL 111 111  CO2 22 26  GLUCOSE 125* 136*  BUN 33* 24*  CREATININE 1.37* 0.92  CALCIUM 9.7 9.4   ------------------------------------------------------------------------------------------------------------------ CrCl cannot be calculated (Unknown ideal weight.). ------------------------------------------------------------------------------------------------------------------ No results for input(s): TSH, T4TOTAL, T3FREE, THYROIDAB in the last 72 hours.  Invalid input(s): FREET3   Coagulation profile No results for input(s): INR, PROTIME in the last 168 hours. ------------------------------------------------------------------------------------------------------------------- No results for input(s): DDIMER in the last 72 hours. -------------------------------------------------------------------------------------------------------------------  Cardiac Enzymes No results for input(s): CKMB, TROPONINI, MYOGLOBIN in the last 168 hours.  Invalid input(s): CK ------------------------------------------------------------------------------------------------------------------ Invalid input(s): POCBNP  ---------------------------------------------------------------------------------------------------------------  Urinalysis    Component Value Date/Time   COLORURINE YELLOW (A) 09/26/2017 1258   APPEARANCEUR CLEAR (A) 09/26/2017 1258   LABSPEC 1.021 09/26/2017 1258   PHURINE 5.0 09/26/2017 1258   GLUCOSEU NEGATIVE 09/26/2017  [place] 09/26/2017 [place] 09/26/2017 [place] 09/26/2017 [place] 09/26/2017 1258    NITRITE NEGATIVE 09/26/2017 1258   LEUKOCYTESUR NEGATIVE 09/26/2017 1258     RADIOLOGY: Dg Lumbar Spine Complete  Result Date: 09/26/2017 CLINICAL DATA:  Low back pain radiating into the left leg since 09/24/2017. Patient suffered a fall today. Initial encounter. EXAM: LUMBAR SPINE - COMPLETE 4+ VIEW COMPARISON:  MRI lumbar spine 12/31/2011. FINDINGS: Mild convex left curvature is seen and was present on the prior examination. The patient has advanced facet degenerative disease from L3-4 to L5-S1, worst at L4-5 where there secondary trace anterolisthesis, unchanged. Loss of disc space height is seen at L4-5. Paraspinous structures demonstrate aortic atherosclerosis. IMPRESSION: No acute abnormality. Lumbar spondylosis appearing worst at L4-5. Mild convex left scoliosis. Atherosclerosis. Electronically Signed   By: Inge Rise M.D.   On: 09/26/2017 14:10   Mr Lumbar Spine Wo Contrast  Result Date: 09/26/2017 CLINICAL DATA:  72 year old female with 3 days of abdominal and back pain. Pain became severe yesterday and radiates down the left leg causing left lower extremity weakness. Multiple recent falls. EXAM: MRI LUMBAR SPINE WITHOUT CONTRAST TECHNIQUE: Multiplanar, multisequence MR imaging of the lumbar spine was performed. No intravenous contrast was administered. COMPARISON:  Lumbar radiographs 1344 hr today. Lumbar MRI 12/31/2011. FINDINGS: Segmentation:  Normal on the comparison radiographs. Alignment: Grade 1 anterolisthesis at L4-L5 has mildly increased since 2013, now measuring 5 millimeters (versus 3 millimeters in 2013). Relatively preserved lumbar lordosis. Very mild levoconvex curvature of the lumbar spine. Vertebrae: No marrow edema or evidence of acute osseous abnormality. Visualized bone marrow signal is within normal limits. Intact visible sacrum and SI joints. Conus medullaris and cauda equina: Conus extends to the T12-L1 level. Conus in proximal cauda equina appear normal.  Paraspinal and other soft tissues: Visualized abdominal viscera and paraspinal soft tissues are within normal limits. Mild to moderate large bowel diverticulosis in the pelvis. Disc levels: No lower thoracic spinal stenosis. L1-L2:  Mild facet hypertrophy. L2-L3: Chronic leftward circumferential disc bulge. Mild to moderate facet and ligament flavum hypertrophy. Borderline to mild left lateral recess and left L2 neural foraminal stenosis. L3-L4: Progressed disc space loss since 2013. Bulky circumferential disc bulge with broad-based posterior and left greater than right neural foraminal involvement. Severe facet and ligament flavum hypertrophy, progressed since the prior MRI. Abnormal increased STIR signal in the interspinous ligament appears degenerative in nature (series 4, image 9) and is new since the prior MRI. Severe spinal and left lateral recess stenosis (descending left L3 nerve level). Moderate to severe left and moderate right L3 neural foraminal stenosis. L4-L5: Chronic anterolisthesis. Chronic severe facet hypertrophy. Bilateral degenerative facet joint fluid. Circumferential disc bulge with broad-based posterior component. Moderate spinal stenosis. Moderate to severe bilateral lateral recess stenosis (descending L5 nerve levels, greater on the left). Mild bilateral L4 foraminal stenosis. Overall this level is mildly progressed since 2013. L5-S1: Moderate facet hypertrophy. Epidural lipomatosis here has regressed since the prior MRI with improved thecal sac patency. No stenosis. IMPRESSION: 1. Substantial progression of L3-L4 spine degeneration since the 2013 MRI. Increased and now Severe multifactorial spinal stenosis at that level with severe left lateral recess stenosis, and moderate to severe left greater than right neural foraminal stenosis. 2. Lesser progression of chronic spinal degeneration at L4-L5 in the setting of chronic spondylolisthesis and severe facet arthropathy. Moderate spinal  stenosis with moderate to severe lateral recess stenosis, greater  on the left. Electronically Signed   By: Genevie Ann M.D.   On: 09/26/2017 21:52   Dg Hip Unilat With Pelvis 2-3 Views Right  Result Date: 09/26/2017 CLINICAL DATA:  Sciatic nerve pain. Status post fall with right hip pain. EXAM: DG HIP (WITH OR WITHOUT PELVIS) 2-3V RIGHT COMPARISON:  None. FINDINGS: There is no evidence of hip fracture or dislocation. Minimal osteoarthritic changes of bilateral hip joints. IMPRESSION: No acute fracture or dislocation identified about the right hip. Electronically Signed   By: Fidela Salisbury M.D.   On: 09/26/2017 14:09    EKG: Orders placed or performed during the hospital encounter of 09/26/17  . EKG 12-Lead  . EKG 12-Lead  . ED EKG  . ED EKG    IMPRESSION AND PLAN:  1.  Acute on chronic lumbar radiculopathy, secondary to spinal stenosis, per lumbar spine MRI.   Emergency room physician discussed with [facility] Neurosurgery, over the phone.  It was recommended that we start Decadron 10 mg IV.  Patient should follow-up as outpatient with Neurosurgery.  We will add Percocet as needed for pain control. 2.  Acute renal failure, likely prerenal.  Creatinine is slightly elevated at 1.37.  Will discontinue hydrochlorothiazide.  We will start gentle IV hydration and monitor kidney function closely.  Avoid nephrotoxic medications.  3.  Hypertension, we will restart home medications.    All the records are reviewed and case discussed with ED provider. Management plans discussed with the patient, family and they are in agreement.  CODE STATUS:    Code Status Orders  (From admission, onward)        Start     Ordered   09/27/17 0237  Full code  Continuous     09/27/17 0237    Code Status History    Date Active Date Inactive Code Status Order ID Comments User Context   This patient has a current code status but no historical code status.       TOTAL TIME TAKING CARE OF THIS PATIENT: 35  minutes.    Amelia Jo M.D on 09/27/2017 at 5:21 AM  Between 7am to 6pm - Pager - [phone]  After 6pm go to [email or web address] - password [email or web address] [facility]  Office  [phone]  CC: Primary care physician; Kathrine Haddock, NP

## 2017-09-29 DIAGNOSIS — I10 Essential (primary) hypertension: Secondary | ICD-10-CM | POA: Diagnosis not present

## 2017-09-29 DIAGNOSIS — M47896 Other spondylosis, lumbar region: Secondary | ICD-10-CM | POA: Diagnosis not present

## 2017-10-02 NOTE — Discharge Summary (Signed)
Teresa [name], is a 72 y.o. female  DOB 1946/07/19  MRN [id number].  Admission date:  09/26/2017  Admitting Physician  Amelia Jo, MD  Discharge Date:  216/2019   Primary MD  Kathrine Haddock, NP  Recommendations for primary care physician for things to follow:   Follow with PCP in 1 week   Admission Diagnosis  Pain [R52] Weakness of left leg [R29.898] Spinal stenosis, unspecified spinal region [M48.00]   Discharge Diagnosis  Pain [R52] Weakness of left leg [R29.898] Spinal stenosis, unspecified spinal region [M48.00]    Active Problems:   Lumbar radiculopathy      Past Medical History:  Diagnosis Date  . Diabetes mellitus without complication ([place])   . Hyperlipidemia   . Hypertension   . Lichen sclerosus   . Obesity   . Osteopenia   . Stroke ([place])   . Thyroid disease     Past Surgical History:  Procedure Laterality Date  . ROTATOR CUFF REPAIR Left   . 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  Hospital Course  #1. acute on chronic low back pain secondary to lumbar radiculopathy from spinal stenosis.  Patient received Decadron in the emergency room, admitted to medical service.  Patient MRI of the lumbosacral spine showed worsening degeneration, patient admitted for pain control with Percocet, physical therapy saw the patient.  Try of lumbosacral spine showed severe spinal stenosis  left greater than right neuroforaminal stenosis.  Patient felt much better the following day and wanted to go home.,  Physical therapy recommended rolling walker. #2. acute renal failure due to dehydration: Improved with IV fluids, creatinine decreased from 1.37-0.92.    Discharge Condition: stable   Follow UP  Follow-up Information    Kathrine Haddock, NP. Schedule an appointment as soon as possible for a visit in 1 week(s).   Specialties:  Nurse Practitioner, Family Medicine Contact information: [address] [place] [address] [phone]        Kristeen Miss, MD Follow up.   Specialty:  Neurosurgery Why:  please give phone nu,ber and info to patient Contact information: [address] [place] [address] [phone]             Discharge Instructions  and  Discharge Medications      Allergies as of 09/27/2017      Reactions   Evista [raloxifene] Nausea Only      Medication List    TAKE these medications   atorvastatin 40 MG tablet Commonly known as:  LIPITOR Take 1 tablet (40 mg total) by mouth at bedtime.   clobetasol ointment 0.05 % Commonly known as:  TEMOVATE Apply topically 2 (two) times daily.   clopidogrel 75 MG tablet Commonly known as:  PLAVIX Take 1 tablet (75 mg total) by mouth daily.   glucose blood test strip Commonly known as:  ONE TOUCH ULTRA TEST 1 each by Other route as needed for other. Use as instructed   hydrochlorothiazide 25 MG tablet Commonly known as:  HYDRODIURIL Take 1 tablet (25 mg total)  by mouth daily.   HYDROcodone-acetaminophen 5-325 MG tablet Commonly known as:  NORCO/VICODIN Take 1-2 tablets by mouth every 4 (four) hours as needed for moderate pain.   Lancets Misc 1 each by Does not apply route daily.   levothyroxine 50 MCG tablet Commonly known as:  SYNTHROID, LEVOTHROID Take 1 tablet (50 mcg total) by mouth daily.   lisinopril 10 MG tablet Commonly known as:  PRINIVIL,ZESTRIL Take 1  tablet (10 mg total) by mouth daily.   metFORMIN 500 MG 24 hr tablet Commonly known as:  GLUCOPHAGE-XR Take 4 tablets (2,000 mg total) by mouth daily with breakfast.   omeprazole 20 MG capsule Commonly known as:  PRILOSEC Take 1 capsule (20 mg total) by mouth daily.         Diet and Activity recommendation: See Discharge Instructions above   Consults obtained -physical therapy   Major procedures and Radiology Reports - PLEASE review detailed and final reports for all details, in brief -      Dg Lumbar Spine Complete  Result Date: 09/26/2017 CLINICAL DATA:  Low back pain radiating into the left leg since 09/24/2017. Patient suffered a fall today. Initial encounter. EXAM: LUMBAR SPINE - COMPLETE 4+ VIEW COMPARISON:  MRI lumbar spine 12/31/2011. FINDINGS: Mild convex left curvature is seen and was present on the prior examination. The patient has advanced facet degenerative disease from L3-4 to L5-S1, worst at L4-5 where there secondary trace anterolisthesis, unchanged. Loss of disc space height is seen at L4-5. Paraspinous structures demonstrate aortic atherosclerosis. IMPRESSION: No acute abnormality. Lumbar spondylosis appearing worst at L4-5. Mild convex left scoliosis. Atherosclerosis. Electronically Signed   By: Inge Rise M.D.   On: 09/26/2017 14:10   Mr Lumbar Spine Wo Contrast  Result Date: 09/26/2017 CLINICAL DATA:  72 year old female with 3 days of abdominal and back pain. Pain became severe yesterday and radiates down the left leg causing left lower extremity weakness. Multiple recent falls. EXAM: MRI LUMBAR SPINE WITHOUT CONTRAST TECHNIQUE: Multiplanar, multisequence MR imaging of the lumbar spine was performed. No intravenous contrast was administered. COMPARISON:  Lumbar radiographs 1344 hr today. Lumbar MRI 12/31/2011. FINDINGS: Segmentation:  Normal on the comparison radiographs. Alignment: Grade 1 anterolisthesis at L4-L5 has mildly increased since 2013, now  measuring 5 millimeters (versus 3 millimeters in 2013). Relatively preserved lumbar lordosis. Very mild levoconvex curvature of the lumbar spine. Vertebrae: No marrow edema or evidence of acute osseous abnormality. Visualized bone marrow signal is within normal limits. Intact visible sacrum and SI joints. Conus medullaris and cauda equina: Conus extends to the T12-L1 level. Conus in proximal cauda equina appear normal. Paraspinal and other soft tissues: Visualized abdominal viscera and paraspinal soft tissues are within normal limits. Mild to moderate large bowel diverticulosis in the pelvis. Disc levels: No lower thoracic spinal stenosis. L1-L2:  Mild facet hypertrophy. L2-L3: Chronic leftward circumferential disc bulge. Mild to moderate facet and ligament flavum hypertrophy. Borderline to mild left lateral recess and left L2 neural foraminal stenosis. L3-L4: Progressed disc space loss since 2013. Bulky circumferential disc bulge with broad-based posterior and left greater than right neural foraminal involvement. Severe facet and ligament flavum hypertrophy, progressed since the prior MRI. Abnormal increased STIR signal in the interspinous ligament appears degenerative in nature (series 4, image 9) and is new since the prior MRI. Severe spinal and left lateral recess stenosis (descending left L3 nerve level). Moderate to severe left and moderate right L3 neural foraminal stenosis. L4-L5: Chronic anterolisthesis. Chronic severe facet hypertrophy. Bilateral degenerative facet  joint fluid. Circumferential disc bulge with broad-based posterior component. Moderate spinal stenosis. Moderate to severe bilateral lateral recess stenosis (descending L5 nerve levels, greater on the left). Mild bilateral L4 foraminal stenosis. Overall this level is mildly progressed since 2013. L5-S1: Moderate facet hypertrophy. Epidural lipomatosis here has regressed since the prior MRI with improved thecal sac patency. No stenosis.  IMPRESSION: 1. Substantial progression of L3-L4 spine degeneration since the 2013 MRI. Increased and now Severe multifactorial spinal stenosis at that level with severe left lateral recess stenosis, and moderate to severe left greater than right neural foraminal stenosis. 2. Lesser progression of chronic spinal degeneration at L4-L5 in the setting of chronic spondylolisthesis and severe facet arthropathy. Moderate spinal stenosis with moderate to severe lateral recess stenosis, greater on the left. Electronically Signed   By: Genevie Ann M.D.   On: 09/26/2017 21:52   Dg Hip Unilat With Pelvis 2-3 Views Right  Result Date: 09/26/2017 CLINICAL DATA:  Sciatic nerve pain. Status post fall with right hip pain. EXAM: DG HIP (WITH OR WITHOUT PELVIS) 2-3V RIGHT COMPARISON:  None. FINDINGS: There is no evidence of hip fracture or dislocation. Minimal osteoarthritic changes of bilateral hip joints. IMPRESSION: No acute fracture or dislocation identified about the right hip. Electronically Signed   By: Fidela Salisbury M.D.   On: 09/26/2017 14:09    Micro Results     No results found for this or any previous visit (from the past 240 hour(s)).     Today   Subjective:   Shawnte Winton today has no headache,no chest abdominal pain,no new weakness tingling or numbness, feels much better wants to go home today.   Objective:   Blood pressure (!) 117/41, pulse 72, temperature 98.4 F (36.9 C), temperature source Oral, resp. rate 18, height 5\' 6"  (1.676 m), weight 67.1 kg (148 lb), SpO2 95 %.  No intake or output data in the 24 hours ending 10/02/17 1412  Exam Awake Alert, Oriented x 3, No new F.N deficits, Normal affect [place].AT,PERRAL Supple Neck,No JVD, No cervical lymphadenopathy appriciated.  Symmetrical Chest wall movement, Good air movement bilaterally, CTAB RRR,No Gallops,Rubs or new Murmurs, No Parasternal Heave +ve B.Sounds, Abd Soft, Non tender, No organomegaly appriciated, No rebound  -guarding or rigidity. No Cyanosis, Clubbing or edema, No new Rash or bruise  Data Review   CBC w Diff:  Lab Results  Component Value Date   WBC 7.0 09/27/2017   HGB 10.7 (L) 09/27/2017   HGB 11.1 04/18/2017   HCT 31.5 (L) 09/27/2017   HCT 34.6 04/18/2017   PLT 241 09/27/2017   PLT 278 04/18/2017    CMP:  Lab Results  Component Value Date   NA 142 09/27/2017   NA 147 (H) 08/06/2017   K 4.3 09/27/2017   CL 111 09/27/2017   CO2 26 09/27/2017   BUN 24 (H) 09/27/2017   BUN 23 08/06/2017   CREATININE 0.92 09/27/2017   PROT 6.5 08/06/2017   ALBUMIN 4.2 08/06/2017   BILITOT <0.2 08/06/2017   ALKPHOS 83 08/06/2017   AST 14 08/06/2017   ALT 18 08/06/2017  .   Total Time in preparing paper work, data evaluation and todays exam - 35 minutes  Epifanio Lesches M.D on 09/27/2017 at 2:12 PM    Note: This dictation was prepared with Dragon dictation along with smaller phrase technology. Any transcriptional errors that result from this process are unintentional.

## 2017-10-06 DIAGNOSIS — M545 Low back pain: Secondary | ICD-10-CM | POA: Diagnosis not present

## 2017-10-08 DIAGNOSIS — M545 Low back pain: Secondary | ICD-10-CM | POA: Diagnosis not present

## 2017-10-13 DIAGNOSIS — M545 Low back pain: Secondary | ICD-10-CM | POA: Diagnosis not present

## 2017-10-16 DIAGNOSIS — M545 Low back pain: Secondary | ICD-10-CM | POA: Diagnosis not present

## 2017-10-20 DIAGNOSIS — M545 Low back pain: Secondary | ICD-10-CM | POA: Diagnosis not present

## 2017-10-23 DIAGNOSIS — M545 Low back pain: Secondary | ICD-10-CM | POA: Diagnosis not present

## 2017-10-28 DIAGNOSIS — M545 Low back pain: Secondary | ICD-10-CM | POA: Diagnosis not present

## 2017-10-30 DIAGNOSIS — M545 Low back pain: Secondary | ICD-10-CM | POA: Diagnosis not present

## 2017-11-04 DIAGNOSIS — M545 Low back pain: Secondary | ICD-10-CM | POA: Diagnosis not present

## 2017-11-06 DIAGNOSIS — M545 Low back pain: Secondary | ICD-10-CM | POA: Diagnosis not present

## 2017-11-10 DIAGNOSIS — M47896 Other spondylosis, lumbar region: Secondary | ICD-10-CM | POA: Diagnosis not present

## 2017-11-28 ENCOUNTER — Ambulatory Visit (INDEPENDENT_AMBULATORY_CARE_PROVIDER_SITE_OTHER): Payer: PPO | Admitting: Unknown Physician Specialty

## 2017-11-28 ENCOUNTER — Telehealth: Payer: Self-pay | Admitting: Unknown Physician Specialty

## 2017-11-28 ENCOUNTER — Encounter: Payer: Self-pay | Admitting: Unknown Physician Specialty

## 2017-11-28 DIAGNOSIS — E782 Mixed hyperlipidemia: Secondary | ICD-10-CM

## 2017-11-28 MED ORDER — ROSUVASTATIN CALCIUM 10 MG PO TABS: 10.0000 mg | ORAL_TABLET | Freq: Every day | ORAL | 2 refills | Status: DC

## 2017-11-28 MED ORDER — BLOOD GLUCOSE METER KIT: PACK | 0 refills | Status: DC

## 2017-11-28 NOTE — Telephone Encounter (Signed)
Copied from [place] [phone]. Topic: Medicare AWV >> Nov 28, 2017 12:50 PM Leo Rod wrote: Called to schedule Medicare Annual Wellness Visit with Nurse Health Advisor. If patient returns call please note: their last AWV was on 01/08/17 please schedule AWV with NHA any date after Jan 08 2018  Thank you! For any questions please contact: Jill Alexanders [phone]  Skype [email or web address]

## 2017-11-28 NOTE — Assessment & Plan Note (Signed)
Will restart statin.  Start Crestor 10 mg.  We will f/u with physical in june

## 2017-11-28 NOTE — Progress Notes (Signed)
BP 128/74   Pulse (!) 50   Temp 98.3 F (36.8 C) (Oral)   Ht 5\' 1"  (1.549 m)   Wt 155 lb 8 oz (70.5 kg)   LMP  (LMP Unknown)   SpO2 100%   BMI 29.38 kg/m    Subjective:    Patient ID: Teresa [name], female    DOB: 1945/12/01, 72 y.o.   MRN: [id number]  HPI: Teresa [name] is a 72 y.o. female  Chief Complaint  Patient presents with  . Hyperlipidemia    pt states she has been off of atorvastatin for about 2 months, worried about being off of something too long   Hyperlipidemia Pt is here for f/u of muscle cramps.  Stopped Atorvastatin for a trial to see if it would help the cramps.  Pt states she is not having cramps since stopping statin.  She is concerned about stroke prevention  Relevant past medical, surgical, family and social history reviewed and updated as indicated. Interim medical history since our last visit reviewed. Allergies and medications reviewed and updated.  Review of Systems  Per HPI unless specifically indicated above     Objective:    BP 128/74   Pulse (!) 50   Temp 98.3 F (36.8 C) (Oral)   Ht 5\' 1"  (1.549 m)   Wt 155 lb 8 oz (70.5 kg)   LMP  (LMP Unknown)   SpO2 100%   BMI 29.38 kg/m   Wt Readings from Last 3 Encounters:  11/28/17 155 lb 8 oz (70.5 kg)  09/27/17 148 lb (67.1 kg)  08/06/17 155 lb (70.3 kg)    Physical Exam  Constitutional: She is oriented to person, place, and time. She appears well-developed and well-nourished. No distress.  HENT:  Head: Normocephalic and atraumatic.  Eyes: Conjunctivae and lids are normal. Right eye exhibits no discharge. Left eye exhibits no discharge. No scleral icterus.  Cardiovascular: Normal rate.  Pulmonary/Chest: Effort normal.  Abdominal: Normal appearance. There is no splenomegaly or hepatomegaly.  Musculoskeletal: Normal range of motion.  Neurological: She is alert and oriented to person, place, and time.  Skin: Skin is intact. No rash noted. No pallor.  Psychiatric: She has a  normal mood and affect. Her behavior is normal. Judgment and thought content normal.    Results for orders placed or performed during the hospital encounter of 08/65/78  Basic metabolic panel  Result Value Ref Range   Sodium 143 135 - 145 mmol/L   Potassium 4.6 3.5 - 5.1 mmol/L   Chloride 111 101 - 111 mmol/L   CO2 22 22 - 32 mmol/L   Glucose, Bld 125 (H) 65 - 99 mg/dL   BUN 33 (H) 6 - 20 mg/dL   Creatinine, Ser 1.37 (H) 0.44 - 1.00 mg/dL   Calcium 9.7 8.9 - 10.3 mg/dL   GFR calc non Af Amer 38 (L) >60 mL/min   GFR calc Af Amer 44 (L) >60 mL/min   Anion gap 10 5 - 15  CBC  Result Value Ref Range   WBC 8.3 3.6 - 11.0 K/uL   RBC 3.93 3.80 - 5.20 MIL/uL   Hemoglobin 11.7 (L) 12.0 - 16.0 g/dL   HCT 35.2 35.0 - 47.0 %   MCV 89.7 80.0 - 100.0 fL   MCH 29.8 26.0 - 34.0 pg   MCHC 33.3 32.0 - 36.0 g/dL   RDW 13.6 11.5 - 14.5 %   Platelets 286 150 - 440 K/uL  Urinalysis, Complete w Microscopic  Result Value Ref Range   Color, Urine YELLOW (A) YELLOW   APPearance CLEAR (A) CLEAR   Specific Gravity, Urine 1.021 1.005 - 1.030   pH 5.0 5.0 - 8.0   Glucose, UA NEGATIVE NEGATIVE mg/dL   Hgb urine dipstick NEGATIVE NEGATIVE   Bilirubin Urine NEGATIVE NEGATIVE   Ketones, ur NEGATIVE NEGATIVE mg/dL   Protein, ur NEGATIVE NEGATIVE mg/dL   Nitrite NEGATIVE NEGATIVE   Leukocytes, UA NEGATIVE NEGATIVE   RBC / HPF 0-5 0 - 5 RBC/hpf   WBC, UA 6-30 0 - 5 WBC/hpf   Bacteria, UA NONE SEEN NONE SEEN   Squamous Epithelial / LPF 0-5 (A) NONE SEEN   Mucus PRESENT   Glucose, capillary  Result Value Ref Range   Glucose-Capillary 114 (H) 65 - 99 mg/dL  Basic metabolic panel  Result Value Ref Range   Sodium 142 135 - 145 mmol/L   Potassium 4.3 3.5 - 5.1 mmol/L   Chloride 111 101 - 111 mmol/L   CO2 26 22 - 32 mmol/L   Glucose, Bld 136 (H) 65 - 99 mg/dL   BUN 24 (H) 6 - 20 mg/dL   Creatinine, Ser 0.92 0.44 - 1.00 mg/dL   Calcium 9.4 8.9 - 10.3 mg/dL   GFR calc non Af Amer >60 >60 mL/min   GFR  calc Af Amer >60 >60 mL/min   Anion gap 5 5 - 15  CBC  Result Value Ref Range   WBC 7.0 3.6 - 11.0 K/uL   RBC 3.53 (L) 3.80 - 5.20 MIL/uL   Hemoglobin 10.7 (L) 12.0 - 16.0 g/dL   HCT 31.5 (L) 35.0 - 47.0 %   MCV 89.4 80.0 - 100.0 fL   MCH 30.2 26.0 - 34.0 pg   MCHC 33.8 32.0 - 36.0 g/dL   RDW 13.8 11.5 - 14.5 %   Platelets 241 150 - 440 K/uL  Glucose, capillary  Result Value Ref Range   Glucose-Capillary 160 (H) 65 - 99 mg/dL  Glucose, capillary  Result Value Ref Range   Glucose-Capillary 175 (H) 65 - 99 mg/dL  Glucose, capillary  Result Value Ref Range   Glucose-Capillary 150 (H) 65 - 99 mg/dL      Assessment & Plan:   Problem List Items Addressed This Visit      Unprioritized   Hyperlipidemia    Will restart statin.  Start Crestor 10 mg.  We will f/u with physical in june      Relevant Medications   rosuvastatin (CRESTOR) 10 MG tablet       Follow up plan: No follow-ups on file.

## 2017-12-01 ENCOUNTER — Telehealth: Payer: Self-pay | Admitting: Unknown Physician Specialty

## 2017-12-01 NOTE — Telephone Encounter (Signed)
Copied from [place] [id number]. Topic: Quick Communication - See Telephone Encounter >> Dec 01, 2017  3:12 PM Arletha Grippe wrote: CRM for notification. See Telephone encounter for: 12/01/17. [facility] called - they need clarification on rosuvastatin (CRESTOR) 10 MG tablet  - authorization to change from atorvastatin . They need a 90 day qty  Cb is [phone], secure voice mail, leave message if no answer

## 2017-12-02 MED ORDER — ROSUVASTATIN CALCIUM 10 MG PO TABS
10.0000 mg | ORAL_TABLET | Freq: Every day | ORAL | 2 refills | Status: DC
Start: 2017-12-02 — End: 2018-08-25

## 2017-12-02 NOTE — Telephone Encounter (Signed)
Intolerant to Atorvastatin.  #90 written.

## 2017-12-08 ENCOUNTER — Telehealth: Payer: Self-pay | Admitting: Unknown Physician Specialty

## 2017-12-08 MED ORDER — BLOOD GLUCOSE METER KIT: PACK | 0 refills | Status: DC

## 2017-12-08 NOTE — Telephone Encounter (Signed)
Rx faxed to [facility], patient notified that this was done.

## 2017-12-08 NOTE — Telephone Encounter (Signed)
Copied from [place] [phone]. Topic: Quick Communication - See Telephone Encounter >> Dec 08, 2017  4:31 PM Vernona Rieger wrote: CRM for notification. See Telephone encounter for: 12/08/17.  Patient said that Teresa [name] was going to send a new prescription in [facility] for her to have a new glucose meter.  I told her that it was sent on 4/19. They told her they did not receive it. Please re-send

## 2017-12-18 DIAGNOSIS — M48062 Spinal stenosis, lumbar region with neurogenic claudication: Secondary | ICD-10-CM | POA: Diagnosis not present

## 2017-12-18 DIAGNOSIS — M5136 Other intervertebral disc degeneration, lumbar region: Secondary | ICD-10-CM | POA: Diagnosis not present

## 2017-12-18 DIAGNOSIS — M5416 Radiculopathy, lumbar region: Secondary | ICD-10-CM | POA: Diagnosis not present

## 2018-01-06 ENCOUNTER — Telehealth: Payer: Self-pay | Admitting: Unknown Physician Specialty

## 2018-01-06 NOTE — Telephone Encounter (Signed)
Order form filled out, signed by Malachy Mood, and faxed to [facility].

## 2018-01-06 NOTE — Telephone Encounter (Signed)
I wrote an rx.  Can you figure out what I need to do

## 2018-01-06 NOTE — Telephone Encounter (Signed)
Copied from [place] [phone]. Topic: Quick Communication - Rx Refill/Question >> Jan 06, 2018 11:54 AM Arletha Grippe wrote: Medication: one touch ultra   Has the patient contacted their pharmacy? Yes.   (Agent: If no, request that the patient contact the pharmacy for the refill.) (Agent: If yes, when and what did the pharmacy advise?)  Preferred Pharmacy (with phone number or street name): [phone] - leave message.  Tammy/   Agent: Please be advised that RX refills may take up to 3 business days. We ask that you follow-up with your pharmacy.  Pharm called about one touch ultra - is it lancets and test strips.  Can they dispense 200 for 90 day supply? Is there any add'l refills?   Per dispensing law in [place], they need first and full last name when leaving message

## 2018-01-06 NOTE — Telephone Encounter (Signed)
Called and got clarification from the patient. She states she needs the strips and lancets sent in to [facility]. Will fill out form and fax to the pharmacy. Patient states she tests BID.

## 2018-01-19 ENCOUNTER — Telehealth: Payer: Self-pay | Admitting: Unknown Physician Specialty

## 2018-01-19 NOTE — Telephone Encounter (Signed)
Can this be written?

## 2018-01-19 NOTE — Telephone Encounter (Signed)
Form filled out, signed by Malachy Mood, and faxed to [facility].

## 2018-01-19 NOTE — Telephone Encounter (Signed)
Copied from [place] [phone]. Topic: General - Other >> Jan 19, 2018 10:03 AM Cecelia Byars, NT wrote: Reason for CRM: Patient called and said she was sent the  glucose blood (ONE TOUCH ULTRA TEST) test strip and she need the one touch delica  and she needs a new prescription sent to [facility] [phone] (Phone) [phone] (Fax)

## 2018-01-19 NOTE — Telephone Encounter (Signed)
LOV 11/28/17  Teresa [name]

## 2018-01-21 NOTE — Telephone Encounter (Signed)
Arbie Cookey with [facility] calling to advise there is not such thing as  One touch delica touch strips  Pt got a new meter 12/08/17 and [facility] must have sent her the wrong supplies. Regardless, carol determined they need to reach out to the pt to clarify what she actually needs, and if they sent the wrong supplies they will replace. Blase Mess will call back if they need anything else.

## 2018-01-23 ENCOUNTER — Telehealth: Payer: Self-pay

## 2018-01-23 DIAGNOSIS — M5136 Other intervertebral disc degeneration, lumbar region: Secondary | ICD-10-CM | POA: Diagnosis not present

## 2018-01-23 DIAGNOSIS — M48062 Spinal stenosis, lumbar region with neurogenic claudication: Secondary | ICD-10-CM | POA: Diagnosis not present

## 2018-01-23 DIAGNOSIS — M5416 Radiculopathy, lumbar region: Secondary | ICD-10-CM | POA: Diagnosis not present

## 2018-01-23 NOTE — Telephone Encounter (Signed)
Copied from [place] [id number]. Topic: General - Other >> Jan 22, 2018  3:19 PM Columbia Stare wrote:  Blase Mess call and said they received a rx for ONE [facility] LANCETS and the RX was only for 45 days and pt need 90 day supply which will be 200 lancets   [place] form filled out, signed by Dr. Wynetta Emery and faxed back to [facility].

## 2018-01-23 NOTE — Telephone Encounter (Signed)
[facility] is calling again checking on status of rx

## 2018-01-26 ENCOUNTER — Other Ambulatory Visit: Payer: Self-pay | Admitting: Unknown Physician Specialty

## 2018-01-26 DIAGNOSIS — Z1231 Encounter for screening mammogram for malignant neoplasm of breast: Secondary | ICD-10-CM

## 2018-01-26 NOTE — Telephone Encounter (Signed)
New RX was faxed to the pharmacy.

## 2018-02-04 ENCOUNTER — Ambulatory Visit (INDEPENDENT_AMBULATORY_CARE_PROVIDER_SITE_OTHER): Payer: PPO

## 2018-02-04 VITALS — BP 122/70 | HR 67 | Temp 98.3°F | Resp 16 | Ht 60.0 in | Wt 152.1 lb

## 2018-02-04 DIAGNOSIS — Z0000 Encounter for general adult medical examination without abnormal findings: Secondary | ICD-10-CM | POA: Diagnosis not present

## 2018-02-04 NOTE — Progress Notes (Signed)
Subjective:   Teresa [name] is a 72 y.o. female who presents for Medicare Annual (Subsequent) preventive examination.  Review of Systems:   Cardiac Risk Factors include: advanced age (>20mn, >>65women);obesity (BMI >30kg/m2);hypertension;dyslipidemia;diabetes mellitus     Objective:     Vitals: BP 122/70 (BP Location: Left Arm, Patient Position: Sitting)   Pulse 67   Temp 98.3 F (36.8 C) (Temporal)   Resp 16   Ht 5' (1.524 m)   Wt 152 lb 1.6 oz (69 kg)   LMP  (LMP Unknown)   BMI 29.70 kg/m   Body mass index is 29.7 kg/m.  Advanced Directives 02/04/2018 09/27/2017 09/26/2017 01/22/2017 01/08/2017 01/03/2016 01/03/2016  Does Patient Have a Medical Advance Directive? _0  No No  Does patient want to make changes to medical advance directive? No - Patient declined - - - - - -  Would patient like information on creating a medical advance directive? - No - Patient declined No - Patient declined Yes (Inpatient - patient requests chaplain consult to create a medical advance directive) Yes (MAU/Ambulatory/Procedural Areas - Information given) No - patient declined information -    Tobacco Social History   Tobacco Use  Smoking Status Former Smoker  . Last attempt to quit: 01/1965  . Years since quitting: 53.1  Smokeless Tobacco Never Used     Counseling given: Not Answered   Clinical Intake:  Pre-visit preparation completed: Yes  Pain : 0-10 Pain Score: 4  Pain Type: Acute pain Pain Location: Abdomen Pain Orientation: Upper Pain Descriptors / Indicators: Aching Pain Onset: In the past 7 days Pain Frequency: Intermittent     Nutritional Status: BMI 25 -29 Overweight Nutritional Risks: Nausea/ vomitting/ diarrhea(nausea and vomiting on sunday ) Diabetes: Yes CBG done?: No Did pt. bring in CBG monitor from home?: No  How often do you need to have someone help you when you read instructions, pamphlets, or other written materials from your doctor or  pharmacy?: 1 - Never What is the last grade level you completed in school?: GED  Interpreter Needed?: No  Information entered by :: Teresa Jernberg,LPN   Past Medical History:  Diagnosis Date  . Diabetes mellitus without complication ([facility]   . Hyperlipidemia   . Hypertension   . Lichen sclerosus   . Obesity   . Osteopenia   . Stroke ([place]   . Thyroid disease    Past Surgical History:  Procedure Laterality Date  . ROTATOR CUFF REPAIR Left   . 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 Hypertension Mother   . Heart disease Mother   . Thyroid disease Mother   . Glaucoma Mother   . Stroke Father   . Diabetes Brother   . Hypertension Maternal Grandfather    Social History   Socioeconomic History  . Marital status: Married    Spouse name: Not on file  . Number of children: Not on file  . Years of education: Not on file  . Highest education level: Not on file  Occupational History  . Not on file  Social Needs  . Financial resource strain: Not hard at all  . Food insecurity:    Worry: Never true    Inability: Never true  . Transportation needs:    Medical: No    Non-medical: No  Tobacco Use  . Smoking status: Former Smoker    Last attempt to quit: 01/1965  Years since quitting: 53.1  . Smokeless tobacco: Never Used  Substance and Sexual Activity  . Alcohol use: No    Alcohol/week: 0.0 oz  . Drug use: No  . Sexual activity: Never  Lifestyle  . Physical activity:    Days per week: 0 days    Minutes per session: 0 min  . Stress: Not at all  Relationships  . Social connections:    Talks on phone: Once a week    Gets together: More than three times a week    Attends religious service: 1 to 4 times per year    Active member of club or organization: No    Attends meetings of clubs or organizations: Never    Relationship status: Married  Other Topics Concern  . Not on file  Social History Narrative  . Not on file     Outpatient Encounter Medications as of 02/04/2018  Medication Sig  . blood glucose meter kit and supplies Dispense based on patient and insurance preference. Use up to four times daily as directed. (FOR ICD-9 250.00, 250.01).  . clobetasol ointment (TEMOVATE) 0.05 % Apply topically 2 (two) times daily.  . clopidogrel (PLAVIX) 75 MG tablet Take 1 tablet (75 mg total) by mouth daily.  Marland Kitchen glucose blood (ONE TOUCH ULTRA TEST) test strip 1 each by Other route as needed for other. Use as instructed  . hydrochlorothiazide (HYDRODIURIL) 25 MG tablet Take 1 tablet (25 mg total) by mouth daily.  . Lancets MISC 1 each by Does not apply route daily.  Marland Kitchen levothyroxine (SYNTHROID, LEVOTHROID) 50 MCG tablet Take 1 tablet (50 mcg total) by mouth daily.  Marland Kitchen lisinopril (PRINIVIL,ZESTRIL) 10 MG tablet Take 1 tablet (10 mg total) by mouth daily.  . metFORMIN (GLUCOPHAGE-XR) 500 MG 24 hr tablet Take 4 tablets (2,000 mg total) by mouth daily with breakfast.  . rosuvastatin (CRESTOR) 10 MG tablet Take 1 tablet (10 mg total) by mouth daily.  Marland Kitchen omeprazole (PRILOSEC) 20 MG capsule Take 1 capsule (20 mg total) by mouth daily. (Patient not taking: Reported on 02/04/2018)   No facility-administered encounter medications on file as of 02/04/2018.     Activities of Daily Living In your present state of health, do you have any difficulty performing the following activities: 02/04/2018 09/27/2017  Hearing? N N  Vision? N N  Difficulty concentrating or making decisions? N N  Walking or climbing stairs? N Y  Dressing or bathing? N N  Doing errands, shopping? N N  Preparing Food and eating ? N -  Using the Toilet? N -  In the past six months, have you accidently leaked urine? N -  Do you have problems with loss of bowel control? N -  Managing your Medications? N -  Managing your Finances? N -  Housekeeping or managing your Housekeeping? N -  Some recent data might be hidden    Patient Care Team: Kathrine Haddock, NP as  PCP - General (Nurse Practitioner) Kathrine Haddock, NP as PCP - Family Medicine (Nurse Practitioner) Sharlet Salina, MD as Referring Physician (Physical Medicine and Rehabilitation)    Assessment:   This is a routine wellness examination for [place].  Exercise Activities and Dietary recommendations Current Exercise Habits: The patient does not participate in regular exercise at present, Exercise limited by: None identified  Goals    . DIET - INCREASE WATER INTAKE     Recommend drinking at least 6-8 glasses of water a day        Fall  Risk Fall Risk  02/04/2018 08/06/2017 01/08/2017 01/03/2016 04/03/2015  Falls in the past year? No No No Yes No  Number falls in past yr: - - - 1 -  Injury with Fall? - - - Yes -  Comment - - - pt states she fell on concrete and hurt her face, elbow, and knees -   Is the patient's home free of loose throw rugs in walkways, pet beds, electrical cords, etc?   yes      Grab bars in the bathroom? yes      Handrails on the stairs?   yes      Adequate lighting?   yes  Timed Get Up and Go performed: Completed in 9 seconds with no use of assistive devices, steady gait. No intervention needed at this time.   Depression Screen PHQ 2/9 Scores 02/04/2018 08/06/2017 01/08/2017 01/08/2017  PHQ - 2 Score 1 0 0 0  PHQ- 9 Score 3 - 2 -     Cognitive Function     6CIT Screen 02/04/2018 01/08/2017  What Year? 0 points 0 points  What month? 0 points 0 points  What time? 0 points 0 points  Count back from 20 0 points 0 points  Months in reverse 0 points 0 points  Repeat phrase 0 points 2 points  Total Score 0 2    Immunization History  Administered Date(s) Administered  . Influenza, High Dose Seasonal PF 07/10/2016, 04/18/2017  . Influenza,inj,Quad PF,6+ Mos 07/04/2015  . Pneumococcal Conjugate-13 04/13/2014  . Pneumococcal Polysaccharide-23 10/14/2014  . Td 03/25/2003  . Tdap 10/05/2010  . Zoster 09/10/2006    Qualifies for Shingles Vaccine? Yes,  discussed shingrix vaccine   Screening Tests Health Maintenance  Topic Date Due  . OPHTHALMOLOGY EXAM  02/22/2017  . FOOT EXAM  07/10/2017  . HEMOGLOBIN A1C  02/04/2018  . INFLUENZA VACCINE  03/12/2018  . MAMMOGRAM  02/25/2019  . TETANUS/TDAP  10/05/2020  . COLONOSCOPY  04/01/2024  . DEXA SCAN  Completed  . Hepatitis C Screening  Completed  . PNA vac Low Risk Adult  Completed    Cancer Screenings: Lung: Low Dose CT Chest recommended if Age 33-80 years, 30 pack-year currently smoking OR have quit w/in 15years. Patient does not qualify. Breast:  Up to date on Mammogram? Yes  Scheduled 02/26/2018 Up to date of Bone Density/Dexa? Yes Colorectal: completed 04/01/2014  Additional Screenings:  Hepatitis C Screening: completed 01/03/2016     Plan:    I have personally reviewed and addressed the Medicare Annual Wellness questionnaire and have noted the following in the patient's chart:  A. Medical and social history B. Use of alcohol, tobacco or illicit drugs  C. Current medications and supplements D. Functional ability and status E.  Nutritional status F.  Physical activity G. Advance directives H. List of other physicians I.  Hospitalizations, surgeries, and ER visits in previous 12 months J.  [place] such as hearing and vision if needed, cognitive and depression L. Referrals and appointments   In addition, I have reviewed and discussed with patient certain preventive protocols, quality metrics, and best practice recommendations. A written personalized care plan for preventive services as well as general preventive health recommendations were provided to patient.   Signed,  Tyler Aas, LPN Nurse Health Advisor   Nurse Notes: due for diabetic exam - cpe on 02/11/2018  Discussed diabetic eye exam, she will make an appointment with [facility].

## 2018-02-04 NOTE — Patient Instructions (Addendum)
Teresa [name] , Thank you for taking time to come for your Medicare Wellness Visit. I appreciate your ongoing commitment to your health goals. Please review the following plan we discussed and let me know if I can assist you in the future.   Screening recommendations/referrals: Colonoscopy: Completed 05/04/2014, due 04/2024 Mammogram: scheduled 02/26/2018 Bone Density: Completed 01/22/2016 Recommended yearly ophthalmology/optometry visit for glaucoma screening and checkup Recommended yearly dental visit for hygiene and checkup  Vaccinations: Influenza vaccine: up to date, due 04/2018 Pneumococcal vaccine: completed series Tdap vaccine: up to date Shingles vaccine: shingrix eligible, check with your insurance company for coverage   Advanced directives: Advance directive discussed with you today. I have provided a copy for you to complete at home and have notarized. Once this is complete please bring a copy in to our office so we can scan it into your chart.  Conditions/risks identified: Recommend drinking at least 4-5 glasses of water a day.   Next appointment: Follow up on 02/11/2018 at 8:00am with Kathrine Haddock. Follow up in one year for your annual wellness exam.     Preventive Care 72 Years and Older, Female Preventive care refers to lifestyle choices and visits with your health care provider that can promote health and wellness. What does preventive care include?  A yearly physical exam. This is also called an annual well check.  Dental exams once or twice a year.  Routine eye exams. Ask your health care provider how often you should have your eyes checked.  Personal lifestyle choices, including:  Daily care of your teeth and gums.  Regular physical activity.  Eating a healthy diet.  Avoiding tobacco and drug use.  Limiting alcohol use.  Practicing safe sex.  Taking low-dose aspirin every day.  Taking vitamin and mineral supplements as recommended by your health care  provider. What happens during an annual well check? The services and screenings done by your health care provider during your annual well check will depend on your age, overall health, lifestyle risk factors, and family history of disease. Counseling  Your health care provider may ask you questions about your:  Alcohol use.  Tobacco use.  Drug use.  Emotional well-being.  Home and relationship well-being.  Sexual activity.  Eating habits.  History of falls.  Memory and ability to understand (cognition).  Work and work Statistician.  Reproductive health. Screening  You may have the following tests or measurements:  Height, weight, and BMI.  Blood pressure.  Lipid and cholesterol levels. These may be checked every 5 years, or more frequently if you are over 63 years old.  Skin check.  Lung cancer screening. You may have this screening every year starting at age 65 if you have a 30-pack-year history of smoking and currently smoke or have quit within the past 15 years.  Fecal occult blood test (FOBT) of the stool. You may have this test every year starting at age 57.  Flexible sigmoidoscopy or colonoscopy. You may have a sigmoidoscopy every 5 years or a colonoscopy every 10 years starting at age 61.  Hepatitis C blood test.  Hepatitis B blood test.  Sexually transmitted disease (STD) testing.  Diabetes screening. This is done by checking your blood sugar (glucose) after you have not eaten for a while (fasting). You may have this done every 1-3 years.  Bone density scan. This is done to screen for osteoporosis. You may have this done starting at age 53.  Mammogram. This may be done every 1-2 years. Talk  to your health care provider about how often you should have regular mammograms. Talk with your health care provider about your test results, treatment options, and if necessary, the need for more tests. Vaccines  Your health care provider may recommend certain  vaccines, such as:  Influenza vaccine. This is recommended every year.  Tetanus, diphtheria, and acellular pertussis (Tdap, Td) vaccine. You may need a Td booster every 10 years.  Zoster vaccine. You may need this after age 66.  Pneumococcal 13-valent conjugate (PCV13) vaccine. One dose is recommended after age 71.  Pneumococcal polysaccharide (PPSV23) vaccine. One dose is recommended after age 56. Talk to your health care provider about which screenings and vaccines you need and how often you need them. This information is not intended to replace advice given to you by your health care provider. Make sure you discuss any questions you have with your health care provider. Document Released: 08/25/2015 Document Revised: 04/17/2016 Document Reviewed: 05/30/2015 Elsevier Interactive Patient Education  2017 [employer] Prevention in the Home Falls can cause injuries. They can happen to people of all ages. There are many things you can do to make your home safe and to help prevent falls. What can I do on the outside of my home?  Regularly fix the edges of walkways and driveways and fix any cracks.  Remove anything that might make you trip as you walk through a door, such as a raised step or threshold.  Trim any bushes or trees on the path to your home.  Use bright outdoor lighting.  Clear any walking paths of anything that might make someone trip, such as rocks or tools.  Regularly check to see if handrails are loose or broken. Make sure that both sides of any steps have handrails.  Any raised decks and porches should have guardrails on the edges.  Have any leaves, snow, or ice cleared regularly.  Use sand or salt on walking paths during winter.  Clean up any spills in your garage right away. This includes oil or grease spills. What can I do in the bathroom?  Use night lights.  Install grab bars by the toilet and in the tub and shower. Do not use towel bars as grab  bars.  Use non-skid mats or decals in the tub or shower.  If you need to sit down in the shower, use a plastic, non-slip stool.  Keep the floor dry. Clean up any water that spills on the floor as soon as it happens.  Remove soap buildup in the tub or shower regularly.  Attach bath mats securely with double-sided non-slip rug tape.  Do not have throw rugs and other things on the floor that can make you trip. What can I do in the bedroom?  Use night lights.  Make sure that you have a light by your bed that is easy to reach.  Do not use any sheets or blankets that are too big for your bed. They should not hang down onto the floor.  Have a firm chair that has side arms. You can use this for support while you get dressed.  Do not have throw rugs and other things on the floor that can make you trip. What can I do in the kitchen?  Clean up any spills right away.  Avoid walking on wet floors.  Keep items that you use a lot in easy-to-reach places.  If you need to reach something above you, use a strong step stool that  has a grab bar.  Keep electrical cords out of the way.  Do not use floor polish or wax that makes floors slippery. If you must use wax, use non-skid floor wax.  Do not have throw rugs and other things on the floor that can make you trip. What can I do with my stairs?  Do not leave any items on the stairs.  Make sure that there are handrails on both sides of the stairs and use them. Fix handrails that are broken or loose. Make sure that handrails are as long as the stairways.  Check any carpeting to make sure that it is firmly attached to the stairs. Fix any carpet that is loose or worn.  Avoid having throw rugs at the top or bottom of the stairs. If you do have throw rugs, attach them to the floor with carpet tape.  Make sure that you have a light switch at the top of the stairs and the bottom of the stairs. If you do not have them, ask someone to add them for  you. What else can I do to help prevent falls?  Wear shoes that:  Do not have high heels.  Have rubber bottoms.  Are comfortable and fit you well.  Are closed at the toe. Do not wear sandals.  If you use a stepladder:  Make sure that it is fully opened. Do not climb a closed stepladder.  Make sure that both sides of the stepladder are locked into place.  Ask someone to hold it for you, if possible.  Clearly mark and make sure that you can see:  Any grab bars or handrails.  First and last steps.  Where the edge of each step is.  Use tools that help you move around (mobility aids) if they are needed. These include:  Canes.  Walkers.  Scooters.  Crutches.  Turn on the lights when you go into a dark area. Replace any light bulbs as soon as they burn out.  Set up your furniture so you have a clear path. Avoid moving your furniture around.  If any of your floors are uneven, fix them.  If there are any pets around you, be aware of where they are.  Review your medicines with your doctor. Some medicines can make you feel dizzy. This can increase your chance of falling. Ask your doctor what other things that you can do to help prevent falls. This information is not intended to replace advice given to you by your health care provider. Make sure you discuss any questions you have with your health care provider. Document Released: 05/25/2009 Document Revised: 01/04/2016 Document Reviewed: 09/02/2014 Elsevier Interactive Patient Education  2017 [employer].

## 2018-02-05 ENCOUNTER — Ambulatory Visit (INDEPENDENT_AMBULATORY_CARE_PROVIDER_SITE_OTHER): Payer: PPO | Admitting: Physician Assistant

## 2018-02-05 ENCOUNTER — Encounter: Payer: Self-pay | Admitting: Physician Assistant

## 2018-02-05 VITALS — BP 130/75 | HR 56 | Wt 153.0 lb

## 2018-02-05 DIAGNOSIS — R1013 Epigastric pain: Secondary | ICD-10-CM | POA: Diagnosis not present

## 2018-02-05 DIAGNOSIS — E1122 Type 2 diabetes mellitus with diabetic chronic kidney disease: Secondary | ICD-10-CM

## 2018-02-05 DIAGNOSIS — N183 Chronic kidney disease, stage 3 unspecified: Secondary | ICD-10-CM

## 2018-02-05 DIAGNOSIS — I10 Essential (primary) hypertension: Secondary | ICD-10-CM | POA: Diagnosis not present

## 2018-02-05 NOTE — Progress Notes (Signed)
Subjective:    Patient ID: Teresa [name], female    DOB: [birth year], 72 y.o.   MRN: [id number]  Teresa [name] is a 72 y.o. female presenting on 02/05/2018 for Nausea (Since Sunday. Vomitted all Sunday night. No more vomitting, just pain)   HPI   Patient with history of HTN, Type II DM, CVA presenting today for epigastric pain after eating. Says she will eat meals and have burning epigastric pain. She vomited Sunday night but has not since then. Still has epigastric pain however after eating. Denies chest pain. No change in stools or bloody stools. She has taken several days of omeprazole 20 mg. This has provided moderate relief. She was seen for similar symptoms by Kathrine Haddock, NP in September 2018 and abdominal ultrasound we negative for gallstones or liver pathology.   Social History   Tobacco Use  . Smoking status: Former Smoker    Last attempt to quit: 01/1965    Years since quitting: 53.1  . Smokeless tobacco: Never Used  Substance Use Topics  . Alcohol use: No    Alcohol/week: 0.0 oz  . Drug use: No    Review of Systems Per HPI unless specifically indicated above     Objective:    BP 130/75   Pulse (!) 56   Wt 153 lb (69.4 kg)   LMP  (LMP Unknown)   SpO2 96%   BMI 29.88 kg/m   Wt Readings from Last 3 Encounters:  02/05/18 153 lb (69.4 kg)  02/04/18 152 lb 1.6 oz (69 kg)  11/28/17 155 lb 8 oz (70.5 kg)    Physical Exam  Constitutional: She appears well-developed and well-nourished.  Cardiovascular: Normal rate and regular rhythm.  Pulmonary/Chest: Effort normal and breath sounds normal.  Abdominal: Soft. Bowel sounds are normal. There is tenderness.  Mild epigastric tenderness.   Skin: Skin is warm and dry.  Psychiatric: She has a normal mood and affect. Her behavior is normal.   Results for orders placed or performed during the hospital encounter of 47/65/46  Basic metabolic panel  Result Value Ref Range   Sodium 143 135 - 145 mmol/L   Potassium 4.6 3.5 - 5.1 mmol/L   Chloride 111 101 - 111 mmol/L   CO2 22 22 - 32 mmol/L   Glucose, Bld 125 (H) 65 - 99 mg/dL   BUN 33 (H) 6 - 20 mg/dL   Creatinine, Ser 1.37 (H) 0.44 - 1.00 mg/dL   Calcium 9.7 8.9 - 10.3 mg/dL   GFR calc non Af Amer 38 (L) >60 mL/min   GFR calc Af Amer 44 (L) >60 mL/min   Anion gap 10 5 - 15  CBC  Result Value Ref Range   WBC 8.3 3.6 - 11.0 K/uL   RBC 3.93 3.80 - 5.20 MIL/uL   Hemoglobin 11.7 (L) 12.0 - 16.0 g/dL   HCT 35.2 35.0 - 47.0 %   MCV 89.7 80.0 - 100.0 fL   MCH 29.8 26.0 - 34.0 pg   MCHC 33.3 32.0 - 36.0 g/dL   RDW 13.6 11.5 - 14.5 %   Platelets 286 150 - 440 K/uL  Urinalysis, Complete w Microscopic  Result Value Ref Range   Color, Urine YELLOW (A) YELLOW   APPearance CLEAR (A) CLEAR   Specific Gravity, Urine 1.021 1.005 - 1.030   pH 5.0 5.0 - 8.0   Glucose, UA NEGATIVE NEGATIVE mg/dL   Hgb urine dipstick NEGATIVE NEGATIVE   Bilirubin Urine NEGATIVE NEGATIVE   Ketones,  ur NEGATIVE NEGATIVE mg/dL   Protein, ur NEGATIVE NEGATIVE mg/dL   Nitrite NEGATIVE NEGATIVE   Leukocytes, UA NEGATIVE NEGATIVE   RBC / HPF 0-5 0 - 5 RBC/hpf   WBC, UA 6-30 0 - 5 WBC/hpf   Bacteria, UA NONE SEEN NONE SEEN   Squamous Epithelial / LPF 0-5 (A) NONE SEEN   Mucus PRESENT   Glucose, capillary  Result Value Ref Range   Glucose-Capillary 114 (H) 65 - 99 mg/dL  Basic metabolic panel  Result Value Ref Range   Sodium 142 135 - 145 mmol/L   Potassium 4.3 3.5 - 5.1 mmol/L   Chloride 111 101 - 111 mmol/L   CO2 26 22 - 32 mmol/L   Glucose, Bld 136 (H) 65 - 99 mg/dL   BUN 24 (H) 6 - 20 mg/dL   Creatinine, Ser 0.92 0.44 - 1.00 mg/dL   Calcium 9.4 8.9 - 10.3 mg/dL   GFR calc non Af Amer >60 >60 mL/min   GFR calc Af Amer >60 >60 mL/min   Anion gap 5 5 - 15  CBC  Result Value Ref Range   WBC 7.0 3.6 - 11.0 K/uL   RBC 3.53 (L) 3.80 - 5.20 MIL/uL   Hemoglobin 10.7 (L) 12.0 - 16.0 g/dL   HCT 31.5 (L) 35.0 - 47.0 %   MCV 89.4 80.0 - 100.0 fL   MCH 30.2  26.0 - 34.0 pg   MCHC 33.8 32.0 - 36.0 g/dL   RDW 13.8 11.5 - 14.5 %   Platelets 241 150 - 440 K/uL  Glucose, capillary  Result Value Ref Range   Glucose-Capillary 160 (H) 65 - 99 mg/dL  Glucose, capillary  Result Value Ref Range   Glucose-Capillary 175 (H) 65 - 99 mg/dL  Glucose, capillary  Result Value Ref Range   Glucose-Capillary 150 (H) 65 - 99 mg/dL      Assessment & Plan:   1. Epigastric pain  Could be some viral gastroenteritis and resultant reflux. Could be ulcer, may consider gastroparesis. She did not have gallstones on recent ultrasound. Will have her continue 20 mg Omeprazole BID. If not improving, will need to re-evaluate, possibly see specialist.   2. Essential hypertension  Stable today, denies chest pain.  3. Type 2 diabetes mellitus with stage 3 chronic kidney disease, without long-term current use of insulin (HCC)  Last A1c controlled.    Follow up plan: Return if symptoms worsen or fail to improve.  Carles Collet, PA-C [facility] 02/05/2018, 3:51 PM

## 2018-02-05 NOTE — Patient Instructions (Signed)
Continue omperazole 20 mg twice daily for two weeks. If not having relief, can take 40 mg twice daily for two weeks. If not improving, you will need to see a gastroenterologist.  Heartburn Heartburn is a type of pain or discomfort that can happen in the throat or chest. It is often described as a burning pain. It may also cause a bad taste in the mouth. Heartburn may feel worse when you lie down or bend over. It may be caused by stomach contents that move back up (reflux) into the tube that connects the mouth with the stomach (esophagus). Follow these instructions at home: Take these actions to lessen your discomfort and to help avoid problems. Diet  Follow a diet as told by your doctor. You may need to avoid foods and drinks such as: ? Coffee and tea (with or without caffeine). ? Drinks that contain alcohol. ? Energy drinks and sports drinks. ? Carbonated drinks or sodas. ? Chocolate and cocoa. ? Peppermint and mint flavorings. ? Garlic and onions. ? Horseradish. ? Spicy and acidic foods, such as peppers, chili powder, curry powder, vinegar, hot sauces, and BBQ sauce. ? Citrus fruit juices and citrus fruits, such as oranges, lemons, and limes. ? Tomato-based foods, such as red sauce, chili, salsa, and pizza with red sauce. ? Fried and fatty foods, such as donuts, french fries, potato chips, and high-fat dressings. ? High-fat meats, such as hot dogs, rib eye steak, sausage, ham, and bacon. ? High-fat dairy items, such as whole milk, butter, and cream cheese.  Eat small meals often. Avoid eating large meals.  Avoid drinking large amounts of liquid with your meals.  Avoid eating meals during the 2-3 hours before bedtime.  Avoid lying down right after you eat.  Do not exercise right after you eat. General instructions  Pay attention to any changes in your symptoms.  Take over-the-counter and prescription medicines only as told by your doctor. Do not take aspirin, ibuprofen, or other  NSAIDs unless your doctor says it is okay.  Do not use any tobacco products, including cigarettes, chewing tobacco, and e-cigarettes. If you need help quitting, ask your doctor.  Wear loose clothes. Do not wear anything tight around your waist.  Raise (elevate) the head of your bed about 6 inches (15 cm).  Try to lower your stress. If you need help doing this, ask your doctor.  If you are overweight, lose an amount of weight that is healthy for you. Ask your doctor about a safe weight loss goal.  Keep all follow-up visits as told by your doctor. This is important. Contact a doctor if:  You have new symptoms.  You lose weight and you do not know why it is happening.  You have trouble swallowing, or it hurts to swallow.  You have wheezing or a cough that keeps happening.  Your symptoms do not get better with treatment.  You have heartburn often for more than two weeks. Get help right away if:  You have pain in your arms, neck, jaw, teeth, or back.  You feel sweaty, dizzy, or light-headed.  You have chest pain or shortness of breath.  You throw up (vomit) and your throw up looks like blood or coffee grounds.  Your poop (stool) is bloody or black. This information is not intended to replace advice given to you by your health care provider. Make sure you discuss any questions you have with your health care provider. Document Released: 04/10/2011 Document Revised: 01/04/2016 Document Reviewed:  11/23/2014 Elsevier Interactive Patient Education  [employer].

## 2018-02-11 ENCOUNTER — Encounter: Payer: Self-pay | Admitting: Unknown Physician Specialty

## 2018-02-11 ENCOUNTER — Ambulatory Visit (INDEPENDENT_AMBULATORY_CARE_PROVIDER_SITE_OTHER): Payer: PPO | Admitting: Unknown Physician Specialty

## 2018-02-11 VITALS — BP 117/71 | HR 47 | Temp 98.1°F | Ht 60.0 in | Wt 154.6 lb

## 2018-02-11 DIAGNOSIS — R001 Bradycardia, unspecified: Secondary | ICD-10-CM | POA: Diagnosis not present

## 2018-02-11 DIAGNOSIS — Z0001 Encounter for general adult medical examination with abnormal findings: Secondary | ICD-10-CM | POA: Diagnosis not present

## 2018-02-11 DIAGNOSIS — E782 Mixed hyperlipidemia: Secondary | ICD-10-CM

## 2018-02-11 DIAGNOSIS — Z0000 Encounter for general adult medical examination without abnormal findings: Secondary | ICD-10-CM

## 2018-02-11 DIAGNOSIS — N183 Chronic kidney disease, stage 3 (moderate): Secondary | ICD-10-CM

## 2018-02-11 DIAGNOSIS — Z7189 Other specified counseling: Secondary | ICD-10-CM | POA: Diagnosis not present

## 2018-02-11 DIAGNOSIS — E039 Hypothyroidism, unspecified: Secondary | ICD-10-CM | POA: Diagnosis not present

## 2018-02-11 DIAGNOSIS — I10 Essential (primary) hypertension: Secondary | ICD-10-CM

## 2018-02-11 DIAGNOSIS — E1122 Type 2 diabetes mellitus with diabetic chronic kidney disease: Secondary | ICD-10-CM

## 2018-02-11 LAB — BAYER DCA HB A1C WAIVED: HB A1C (BAYER DCA - WAIVED): 6.3 % (ref <7.0)

## 2018-02-11 NOTE — Assessment & Plan Note (Addendum)
Noe pulse below 50. EKG showing sinus brady.  Will refer to cardiology for further evaluation

## 2018-02-11 NOTE — Progress Notes (Signed)
BP 117/71   Pulse (!) 47   Temp 98.1 F (36.7 C) (Oral)   Ht 5' (1.524 m)   Wt 154 lb 9.6 oz (70.1 kg)   LMP  (LMP Unknown)   SpO2 99%   BMI 30.19 kg/m    Subjective:    Patient ID: Teresa [name], female    DOB: Apr 30, 1946, 72 y.o.   MRN: [id number]  HPI: Teresa [name] is a 72 y.o. female  Chief Complaint  Patient presents with  . Annual Exam    pt had wellness exam 02/04/18   Diabetes: Using medications without difficulties No hypoglycemic episodes No hyperglycemic episodes Feet problems:none Blood Sugars averaging:120's-130's Planning on scheduling eye exam Last Hgb A1C: 6.2  Hypertension  Using medications without difficulty Average home BPs Not checking   Using medication without problems or lightheadedness No chest pain with exertion or shortness of breath No Edema  Elevated Cholesterol Using medications without problems No Muscle aches  Diet: Exercise:No exercise.  Watches diet "somewhat"  Social History   Socioeconomic History  . Marital status: Married    Spouse name: Not on file  . Number of children: Not on file  . Years of education: Not on file  . Highest education level: Not on file  Occupational History  . Not on file  Social Needs  . Financial resource strain: Not hard at all  . Food insecurity:    Worry: Never true    Inability: Never true  . Transportation needs:    Medical: No    Non-medical: No  Tobacco Use  . Smoking status: Former Smoker    Last attempt to quit: 01/1965    Years since quitting: 53.1  . Smokeless tobacco: Never Used  Substance and Sexual Activity  . Alcohol use: No    Alcohol/week: 0.0 oz  . Drug use: No  . Sexual activity: Never  Lifestyle  . Physical activity:    Days per week: 0 days    Minutes per session: 0 min  . Stress: Not at all  Relationships  . Social connections:    Talks on phone: Once a week    Gets together: More than three times a week    Attends religious service: 1 to 4  times per year    Active member of club or organization: No    Attends meetings of clubs or organizations: Never    Relationship status: Married  . Intimate partner violence:    Fear of current or ex partner: No    Emotionally abused: No    Physically abused: No    Forced sexual activity: No  Other Topics Concern  . Not on file  Social History Narrative  . Not on file   Family History  Problem Relation Age of Onset  . Hypertension Mother   . Heart disease Mother   . Thyroid disease Mother   . Glaucoma Mother   . Stroke Father   . Diabetes Brother   . Hypertension Maternal Grandfather    Past Medical History:  Diagnosis Date  . Diabetes mellitus without complication ([place])   . Hyperlipidemia   . Hypertension   . Lichen sclerosus   . Obesity   . Osteopenia   . Stroke ([place])   . Thyroid disease    Past Surgical History:  Procedure Laterality Date  . ROTATOR CUFF REPAIR Left   . 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 Interim medical history since our last visit reviewed. Allergies and medications reviewed and updated.  Review of Systems  Constitutional:       Feels sluggish  HENT: Negative.   Respiratory: Negative.   Cardiovascular: Negative.   Gastrointestinal: Negative.   Genitourinary: Negative.   Musculoskeletal: Negative.   Neurological: Negative.   Psychiatric/Behavioral: Negative.     Per HPI unless specifically indicated above     Objective:    BP 117/71   Pulse (!) 47   Temp 98.1 F (36.7 C) (Oral)   Ht 5' (1.524 m)   Wt 154 lb 9.6 oz (70.1 kg)   LMP  (LMP Unknown)   SpO2 99%   BMI 30.19 kg/m   Wt Readings from Last 3 Encounters:  02/11/18 154 lb 9.6 oz (70.1 kg)  02/05/18 153 lb (69.4 kg)  02/04/18 152 lb 1.6 oz (69 kg)    Physical Exam  Constitutional: She is oriented to person, place, and time. She appears well-developed and well-nourished.    HENT:  Head: Normocephalic and atraumatic.  Eyes: Pupils are equal, round, and reactive to light. Right eye exhibits no discharge. Left eye exhibits no discharge. No scleral icterus.  Neck: Normal range of motion. Neck supple. Carotid bruit is not present. No thyromegaly present.  Cardiovascular: Normal rate, regular rhythm and normal heart sounds. Exam reveals no gallop and no friction rub.  No murmur heard. Pulmonary/Chest: Effort normal and breath sounds normal. No respiratory distress. She has no wheezes. She has no rales. No breast tenderness or discharge.  Abdominal: Soft. Bowel sounds are normal. There is no tenderness. There is no rebound.  Genitourinary: No breast tenderness or discharge.  Musculoskeletal: Normal range of motion.  Lymphadenopathy:    She has no cervical adenopathy.  Neurological: She is alert and oriented to person, place, and time.  Skin: Skin is warm, dry and intact. No rash noted.  Psychiatric: She has a normal mood and affect. Her speech is normal and behavior is normal. Judgment and thought content normal. Cognition and memory are normal.   EKG normal - showing NSR with bradycardia. No STTW changes  Results for orders placed or performed during the hospital encounter of 34/19/62  Basic metabolic panel  Result Value Ref Range   Sodium 143 135 - 145 mmol/L   Potassium 4.6 3.5 - 5.1 mmol/L   Chloride 111 101 - 111 mmol/L   CO2 22 22 - 32 mmol/L   Glucose, Bld 125 (H) 65 - 99 mg/dL   BUN 33 (H) 6 - 20 mg/dL   Creatinine, Ser 1.37 (H) 0.44 - 1.00 mg/dL   Calcium 9.7 8.9 - 10.3 mg/dL   GFR calc non Af Amer 38 (L) >60 mL/min   GFR calc Af Amer 44 (L) >60 mL/min   Anion gap 10 5 - 15  CBC  Result Value Ref Range   WBC 8.3 3.6 - 11.0 K/uL   RBC 3.93 3.80 - 5.20 MIL/uL   Hemoglobin 11.7 (L) 12.0 - 16.0 g/dL   HCT 35.2 35.0 - 47.0 %   MCV 89.7 80.0 - 100.0 fL   MCH 29.8 26.0 - 34.0 pg   MCHC 33.3 32.0 - 36.0 g/dL   RDW 13.6 11.5 - 14.5 %   Platelets 286  150 - 440 K/uL  Urinalysis, Complete w Microscopic  Result Value Ref Range   Color, Urine YELLOW (A) YELLOW   APPearance CLEAR (A) CLEAR   Specific Gravity, Urine  1.021 1.005 - 1.030   pH 5.0 5.0 - 8.0   Glucose, UA NEGATIVE NEGATIVE mg/dL   Hgb urine dipstick NEGATIVE NEGATIVE   Bilirubin Urine NEGATIVE NEGATIVE   Ketones, ur NEGATIVE NEGATIVE mg/dL   Protein, ur NEGATIVE NEGATIVE mg/dL   Nitrite NEGATIVE NEGATIVE   Leukocytes, UA NEGATIVE NEGATIVE   RBC / HPF 0-5 0 - 5 RBC/hpf   WBC, UA 6-30 0 - 5 WBC/hpf   Bacteria, UA NONE SEEN NONE SEEN   Squamous Epithelial / LPF 0-5 (A) NONE SEEN   Mucus PRESENT   Glucose, capillary  Result Value Ref Range   Glucose-Capillary 114 (H) 65 - 99 mg/dL  Basic metabolic panel  Result Value Ref Range   Sodium 142 135 - 145 mmol/L   Potassium 4.3 3.5 - 5.1 mmol/L   Chloride 111 101 - 111 mmol/L   CO2 26 22 - 32 mmol/L   Glucose, Bld 136 (H) 65 - 99 mg/dL   BUN 24 (H) 6 - 20 mg/dL   Creatinine, Ser 0.92 0.44 - 1.00 mg/dL   Calcium 9.4 8.9 - 10.3 mg/dL   GFR calc non Af Amer >60 >60 mL/min   GFR calc Af Amer >60 >60 mL/min   Anion gap 5 5 - 15  CBC  Result Value Ref Range   WBC 7.0 3.6 - 11.0 K/uL   RBC 3.53 (L) 3.80 - 5.20 MIL/uL   Hemoglobin 10.7 (L) 12.0 - 16.0 g/dL   HCT 31.5 (L) 35.0 - 47.0 %   MCV 89.4 80.0 - 100.0 fL   MCH 30.2 26.0 - 34.0 pg   MCHC 33.8 32.0 - 36.0 g/dL   RDW 13.8 11.5 - 14.5 %   Platelets 241 150 - 440 K/uL  Glucose, capillary  Result Value Ref Range   Glucose-Capillary 160 (H) 65 - 99 mg/dL  Glucose, capillary  Result Value Ref Range   Glucose-Capillary 175 (H) 65 - 99 mg/dL  Glucose, capillary  Result Value Ref Range   Glucose-Capillary 150 (H) 65 - 99 mg/dL      Assessment & Plan:   Problem List Items Addressed This Visit      Unprioritized   Advanced care planning/counseling discussion    A voluntary discussion about advance care planning including the explanation and discussion of advance  directives was extensively discussed  with the patient.  Explanation about the health care proxy and Living will was reviewed and packet of forms and educational materials not given due to patients reluctance on the subject.  During this discussion, the patient was able to identify a health care proxy as her husband and her son as secondary and not sure if she  plans to fill out the paperwork required.  Patient was offered a separate [place] visit for further assistance with forms. Time spent: 20   Individuals present: only patient       Bradycardia - Primary    Noe pulse below 50. EKG showing sinus brady.  Will refer to cardiology for further evaluation      Relevant Orders   EKG 12-Lead (Completed)   Ambulatory referral to Cardiology   TSH   Hyperlipidemia   Relevant Orders   Lipid Panel w/o Chol/HDL Ratio   Hypertension    Stable, continue present medications.        Hypothyroidism    With Bradycardia.  Check TSH.        Type 2 diabetes mellitus with stage 3 chronic kidney disease, without  long-term current use of insulin (HCC)    Hgb A1C is 6.3.  Continue present medication      Relevant Orders   Bayer DCA Hb A1c Waived   Comprehensive metabolic panel   CBC with Differential/Platelet    Other Visit Diagnoses    Annual physical exam        -    Follow up plan: Return in about 6 months (around 08/14/2018).

## 2018-02-11 NOTE — Assessment & Plan Note (Signed)
With Bradycardia.  Check TSH.

## 2018-02-11 NOTE — Assessment & Plan Note (Addendum)
A voluntary discussion about advance care planning including the explanation and discussion of advance directives was extensively discussed  with the patient.  Explanation about the health care proxy and Living will was reviewed and packet of forms and educational materials not given due to patients reluctance on the subject.  During this discussion, the patient was able to identify a health care proxy as her husband and her son as secondary and not sure if she  plans to fill out the paperwork required.  Patient was offered a separate [place] visit for further assistance with forms. Time spent: 20   Individuals present: only patient

## 2018-02-11 NOTE — Assessment & Plan Note (Signed)
Stable, continue present medications.   

## 2018-02-11 NOTE — Assessment & Plan Note (Signed)
Hgb A1C is 6.3.  Continue present medication

## 2018-02-12 LAB — COMPREHENSIVE METABOLIC PANEL
ALT: 15 IU/L (ref 0–32)
AST: 15 IU/L (ref 0–40)
Albumin/Globulin Ratio: 1.7 (ref 1.2–2.2)
Albumin: 4.1 g/dL (ref 3.5–4.8)
Alkaline Phosphatase: 71 IU/L (ref 39–117)
BUN/Creatinine Ratio: 24 (ref 12–28)
BUN: 27 mg/dL (ref 8–27)
Bilirubin Total: 0.2 mg/dL (ref 0.0–1.2)
CALCIUM: 9.7 mg/dL (ref 8.7–10.3)
CO2: 22 mmol/L (ref 20–29)
CREATININE: 1.11 mg/dL — AB (ref 0.57–1.00)
Chloride: 109 mmol/L — ABNORMAL HIGH (ref 96–106)
GFR calc Af Amer: 58 mL/min/{1.73_m2} — ABNORMAL LOW (ref >59)
GFR, EST NON AFRICAN AMERICAN: 50 mL/min/{1.73_m2} — AB (ref >59)
GLOBULIN, TOTAL: 2.4 g/dL (ref 1.5–4.5)
Glucose: 99 mg/dL (ref 65–99)
Potassium: 4.5 mmol/L (ref 3.5–5.2)
Sodium: 144 mmol/L (ref 134–144)
Total Protein: 6.5 g/dL (ref 6.0–8.5)

## 2018-02-12 LAB — CBC WITH DIFFERENTIAL/PLATELET
Basophils Absolute: 0 10*3/uL (ref 0.0–0.2)
Basos: 0 %
EOS (ABSOLUTE): 0.1 10*3/uL (ref 0.0–0.4)
Eos: 1 %
HEMOGLOBIN: 10.2 g/dL — AB (ref 11.1–15.9)
Hematocrit: 31.4 % — ABNORMAL LOW (ref 34.0–46.6)
Immature Grans (Abs): 0 10*3/uL (ref 0.0–0.1)
Immature Granulocytes: 0 %
LYMPHS ABS: 2.1 10*3/uL (ref 0.7–3.1)
Lymphs: 34 %
MCH: 30.4 pg (ref 26.6–33.0)
MCHC: 32.5 g/dL (ref 31.5–35.7)
MCV: 94 fL (ref 79–97)
MONOS ABS: 0.5 10*3/uL (ref 0.1–0.9)
Monocytes: 8 %
NEUTROS ABS: 3.3 10*3/uL (ref 1.4–7.0)
Neutrophils: 57 %
PLATELETS: 225 10*3/uL (ref 150–450)
RBC: 3.36 x10E6/uL — ABNORMAL LOW (ref 3.77–5.28)
RDW: 12.5 % (ref 12.3–15.4)
WBC: 6 10*3/uL (ref 3.4–10.8)

## 2018-02-12 LAB — LIPID PANEL W/O CHOL/HDL RATIO
Cholesterol, Total: 117 mg/dL (ref 100–199)
HDL: 55 mg/dL (ref >39)
LDL CALC: 48 mg/dL (ref 0–99)
TRIGLYCERIDES: 68 mg/dL (ref 0–149)
VLDL CHOLESTEROL CAL: 14 mg/dL (ref 5–40)

## 2018-02-12 LAB — TSH: TSH: 1.85 u[IU]/mL (ref 0.450–4.500)

## 2018-02-13 ENCOUNTER — Other Ambulatory Visit: Payer: Self-pay | Admitting: Unknown Physician Specialty

## 2018-02-13 DIAGNOSIS — R944 Abnormal results of kidney function studies: Secondary | ICD-10-CM

## 2018-02-13 DIAGNOSIS — D649 Anemia, unspecified: Secondary | ICD-10-CM

## 2018-02-13 NOTE — Progress Notes (Signed)
Needs recheck CMP,CBC, and anemia panel due to normocytic anemia and low GFR

## 2018-02-25 DIAGNOSIS — M47896 Other spondylosis, lumbar region: Secondary | ICD-10-CM | POA: Diagnosis not present

## 2018-02-25 DIAGNOSIS — I10 Essential (primary) hypertension: Secondary | ICD-10-CM | POA: Diagnosis not present

## 2018-02-25 DIAGNOSIS — Z6827 Body mass index (BMI) 27.0-27.9, adult: Secondary | ICD-10-CM | POA: Diagnosis not present

## 2018-02-26 ENCOUNTER — Ambulatory Visit
Admission: RE | Admit: 2018-02-26 | Discharge: 2018-02-26 | Disposition: A | Discharge status: Home / Self Care | Payer: PPO | Source: Ambulatory Visit | Attending: Unknown Physician Specialty | Admitting: Unknown Physician Specialty

## 2018-02-26 DIAGNOSIS — Z1231 Encounter for screening mammogram for malignant neoplasm of breast: Secondary | ICD-10-CM

## 2018-03-20 ENCOUNTER — Ambulatory Visit (INDEPENDENT_AMBULATORY_CARE_PROVIDER_SITE_OTHER): Payer: PPO | Admitting: Family Medicine

## 2018-03-20 ENCOUNTER — Encounter: Payer: Self-pay | Admitting: Family Medicine

## 2018-03-20 VITALS — BP 132/69 | HR 59 | Temp 98.1°F | Wt 159.1 lb

## 2018-03-20 DIAGNOSIS — E782 Mixed hyperlipidemia: Secondary | ICD-10-CM

## 2018-03-20 DIAGNOSIS — D649 Anemia, unspecified: Secondary | ICD-10-CM

## 2018-03-20 DIAGNOSIS — R001 Bradycardia, unspecified: Secondary | ICD-10-CM

## 2018-03-20 DIAGNOSIS — R944 Abnormal results of kidney function studies: Secondary | ICD-10-CM | POA: Diagnosis not present

## 2018-03-20 NOTE — Assessment & Plan Note (Signed)
Tolerating crestor well. Rechecking levels today. Call with any concerns.

## 2018-03-20 NOTE — Progress Notes (Signed)
BP 132/69 (BP Location: Left Arm, Patient Position: Sitting, Cuff Size: Normal)   Pulse (!) 59   Temp 98.1 F (36.7 C)   Wt 159 lb 1 oz (72.2 kg)   LMP  (LMP Unknown)   SpO2 99%   BMI 31.06 kg/m    Subjective:    Patient ID: Teresa [name], female    DOB: [birth year], 72 y.o.   MRN: [id number]  HPI: Teresa [name] is a 72 y.o. female  Chief Complaint  Patient presents with  . Anemia   Bradycardic last visit with HR below 50, not on a beta-blocker. Has an appointment scheduled to see Dr. Saunders Revel in 2 week for evaluation of her bradycardia. On last blood work, was creatine was slightly elevated at 1.11, down from 5 months ago when it was 1.37. Hemoglobin down at 10.2- with a MCV of 94. This has been dropping for the lat 5 months when it was 11.7 and then 10.7. Here today for blood work to recheck this and for an anemia panel.   Today, she states that she is feeling well. Continues to be a bit sluggish. Not taking any iron supplements. She is on plavix and a baby aspirin. No blood in her stool no vaginal bleeding. No SOB, No palpitations, No pica. She has been working on drinking more water.   HYPERLIPIDEMIA Hyperlipidemia status: excellent compliance Satisfied with current treatment?  yes Side effects:  no Medication compliance: excellent compliance Past cholesterol meds: atorvastain (lipitor) and simvastatin (zocor) Supplements: none Aspirin:  yes The ASCVD Risk score Mikey Bussing DC Jr., et al., 2013) failed to calculate for the following reasons:   The valid total cholesterol range is 130 to 320 mg/dL Chest pain:  no Coronary artery disease:  no  Relevant past medical, surgical, family and social history reviewed and updated as indicated. Interim medical history since our last visit reviewed. Allergies and medications reviewed and updated.  Review of Systems  Constitutional: Negative.   Respiratory: Negative.   Cardiovascular: Negative.   Psychiatric/Behavioral: Negative.       Per HPI unless specifically indicated above     Objective:    BP 132/69 (BP Location: Left Arm, Patient Position: Sitting, Cuff Size: Normal)   Pulse (!) 59   Temp 98.1 F (36.7 C)   Wt 159 lb 1 oz (72.2 kg)   LMP  (LMP Unknown)   SpO2 99%   BMI 31.06 kg/m   Wt Readings from Last 3 Encounters:  03/20/18 159 lb 1 oz (72.2 kg)  02/11/18 154 lb 9.6 oz (70.1 kg)  02/05/18 153 lb (69.4 kg)    Physical Exam  Constitutional: She is oriented to person, place, and time. She appears well-developed and well-nourished. No distress.  HENT:  Head: Normocephalic and atraumatic.  Right Ear: Hearing normal.  Left Ear: Hearing normal.  Nose: Nose normal.  Eyes: Conjunctivae and lids are normal. Right eye exhibits no discharge. Left eye exhibits no discharge. No scleral icterus.  Cardiovascular: Normal rate, regular rhythm, normal heart sounds and intact distal pulses. Exam reveals no gallop and no friction rub.  No murmur heard. Pulmonary/Chest: Effort normal and breath sounds normal. No stridor. No respiratory distress. She has no wheezes. She has no rales. She exhibits no tenderness.  Musculoskeletal: Normal range of motion.  Neurological: She is alert and oriented to person, place, and time.  Skin: Skin is intact. No rash noted. She is not diaphoretic.  Psychiatric: She has a normal mood and affect. Her  speech is normal and behavior is normal. Judgment and thought content normal. Cognition and memory are normal.    Results for orders placed or performed in visit on 02/11/18  Bayer DCA Hb A1c Waived  Result Value Ref Range   HB A1C (BAYER DCA - WAIVED) 6.3 <7.0 %  Comprehensive metabolic panel  Result Value Ref Range   Glucose 99 65 - 99 mg/dL   BUN 27 8 - 27 mg/dL   Creatinine, Ser 1.11 (H) 0.57 - 1.00 mg/dL   GFR calc non Af Amer 50 (L) >59 mL/min/1.73   GFR calc Af Amer 58 (L) >59 mL/min/1.73   BUN/Creatinine Ratio 24 12 - 28   Sodium 144 134 - 144 mmol/L   Potassium 4.5 3.5  - 5.2 mmol/L   Chloride 109 (H) 96 - 106 mmol/L   CO2 22 20 - 29 mmol/L   Calcium 9.7 8.7 - 10.3 mg/dL   Total Protein 6.5 6.0 - 8.5 g/dL   Albumin 4.1 3.5 - 4.8 g/dL   Globulin, Total 2.4 1.5 - 4.5 g/dL   Albumin/Globulin Ratio 1.7 1.2 - 2.2   Bilirubin Total 0.2 0.0 - 1.2 mg/dL   Alkaline Phosphatase 71 39 - 117 IU/L   AST 15 0 - 40 IU/L   ALT 15 0 - 32 IU/L  Lipid Panel w/o Chol/HDL Ratio  Result Value Ref Range   Cholesterol, Total 117 100 - 199 mg/dL   Triglycerides 68 0 - 149 mg/dL   HDL 55 >39 mg/dL   VLDL Cholesterol Cal 14 5 - 40 mg/dL   LDL Calculated 48 0 - 99 mg/dL  TSH  Result Value Ref Range   TSH 1.850 0.450 - 4.500 uIU/mL  CBC with Differential/Platelet  Result Value Ref Range   WBC 6.0 3.4 - 10.8 x10E3/uL   RBC 3.36 (L) 3.77 - 5.28 x10E6/uL   Hemoglobin 10.2 (L) 11.1 - 15.9 g/dL   Hematocrit 31.4 (L) 34.0 - 46.6 %   MCV 94 79 - 97 fL   MCH 30.4 26.6 - 33.0 pg   MCHC 32.5 31.5 - 35.7 g/dL   RDW 12.5 12.3 - 15.4 %   Platelets 225 150 - 450 x10E3/uL   Neutrophils 57 Not Estab. %   Lymphs 34 Not Estab. %   Monocytes 8 Not Estab. %   Eos 1 Not Estab. %   Basos 0 Not Estab. %   Neutrophils Absolute 3.3 1.4 - 7.0 x10E3/uL   Lymphocytes Absolute 2.1 0.7 - 3.1 x10E3/uL   Monocytes Absolute 0.5 0.1 - 0.9 x10E3/uL   EOS (ABSOLUTE) 0.1 0.0 - 0.4 x10E3/uL   Basophils Absolute 0.0 0.0 - 0.2 x10E3/uL   Immature Granulocytes 0 Not Estab. %   Immature Grans (Abs) 0.0 0.0 - 0.1 x10E3/uL      Assessment & Plan:   Problem List Items Addressed This Visit      Other   Hyperlipidemia    Tolerating crestor well. Rechecking levels today. Call with any concerns.       Relevant Orders   Lipid Panel w/o Chol/HDL Ratio   Bradycardia    To see cardiology at the end of August. HR better today. Call with any concerns.        Other Visit Diagnoses    Anemia, unspecified type    -  Primary   Checking labs today. Await results. Will treat as needed.    Decreased GFR        Checking labs today. Await results.  Will treat as needed.        Follow up plan: Return in about 5 months (around 08/20/2018) for 6 month follow up.

## 2018-03-20 NOTE — Assessment & Plan Note (Signed)
To see cardiology at the end of August. HR better today. Call with any concerns.

## 2018-03-23 ENCOUNTER — Encounter: Payer: Self-pay | Admitting: Family Medicine

## 2018-03-23 ENCOUNTER — Telehealth: Payer: Self-pay | Admitting: Family Medicine

## 2018-03-23 DIAGNOSIS — D509 Iron deficiency anemia, unspecified: Secondary | ICD-10-CM

## 2018-03-23 LAB — COMPREHENSIVE METABOLIC PANEL
ALK PHOS: 70 IU/L (ref 39–117)
ALT: 13 IU/L (ref 0–32)
AST: 12 IU/L (ref 0–40)
Albumin/Globulin Ratio: 1.7 (ref 1.2–2.2)
Albumin: 4 g/dL (ref 3.5–4.8)
BUN / CREAT RATIO: 18 (ref 12–28)
BUN: 17 mg/dL (ref 8–27)
CHLORIDE: 108 mmol/L — AB (ref 96–106)
CO2: 21 mmol/L (ref 20–29)
CREATININE: 0.92 mg/dL (ref 0.57–1.00)
Calcium: 9.3 mg/dL (ref 8.7–10.3)
GFR calc Af Amer: 72 mL/min/{1.73_m2} (ref >59)
GFR calc non Af Amer: 63 mL/min/{1.73_m2} (ref >59)
GLUCOSE: 121 mg/dL — AB (ref 65–99)
Globulin, Total: 2.3 g/dL (ref 1.5–4.5)
Potassium: 5 mmol/L (ref 3.5–5.2)
Sodium: 145 mmol/L — ABNORMAL HIGH (ref 134–144)
Total Protein: 6.3 g/dL (ref 6.0–8.5)

## 2018-03-23 LAB — LIPID PANEL W/O CHOL/HDL RATIO
CHOLESTEROL TOTAL: 100 mg/dL (ref 100–199)
HDL: 47 mg/dL (ref >39)
LDL Calculated: 37 mg/dL (ref 0–99)
TRIGLYCERIDES: 82 mg/dL (ref 0–149)
VLDL CHOLESTEROL CAL: 16 mg/dL (ref 5–40)

## 2018-03-23 LAB — ANEMIA PANEL
FERRITIN: 40 ng/mL (ref 15–150)
FOLATE, RBC: 2014 ng/mL (ref >498)
Folate, Hemolysate: 606.2 ng/mL
HEMATOCRIT: 30.1 % — AB (ref 34.0–46.6)
IRON SATURATION: 13 % — AB (ref 15–55)
Iron: 41 ug/dL (ref 27–139)
RETIC CT PCT: 1.1 % (ref 0.6–2.6)
TIBC: 318 ug/dL (ref 250–450)
UIBC: 277 ug/dL (ref 118–369)
VITAMIN B 12: 922 pg/mL (ref 232–1245)

## 2018-03-23 LAB — CBC WITH DIFFERENTIAL/PLATELET
BASOS ABS: 0 10*3/uL (ref 0.0–0.2)
Basos: 0 %
EOS (ABSOLUTE): 0.1 10*3/uL (ref 0.0–0.4)
Eos: 2 %
Hemoglobin: 9.5 g/dL — ABNORMAL LOW (ref 11.1–15.9)
IMMATURE GRANULOCYTES: 0 %
Immature Grans (Abs): 0 10*3/uL (ref 0.0–0.1)
LYMPHS ABS: 2.7 10*3/uL (ref 0.7–3.1)
Lymphs: 36 %
MCH: 29.5 pg (ref 26.6–33.0)
MCHC: 31.6 g/dL (ref 31.5–35.7)
MCV: 94 fL (ref 79–97)
MONOCYTES: 6 %
Monocytes Absolute: 0.5 10*3/uL (ref 0.1–0.9)
NEUTROS PCT: 56 %
Neutrophils Absolute: 4.2 10*3/uL (ref 1.4–7.0)
PLATELETS: 247 10*3/uL (ref 150–450)
RBC: 3.22 x10E6/uL — ABNORMAL LOW (ref 3.77–5.28)
RDW: 13.8 % (ref 12.3–15.4)
WBC: 7.6 10*3/uL (ref 3.4–10.8)

## 2018-03-23 MED ORDER — FERROUS SULFATE 325 (65 FE) MG PO TABS: 325.0000 mg | ORAL_TABLET | Freq: Two times a day (BID) | ORAL | 3 refills | Status: DC

## 2018-03-23 NOTE — Telephone Encounter (Signed)
-----   Message from [name], DO sent at 03/23/2018 12:13 PM EDT -----   ----- Message ----- From: Lavone Neri Lab Results In Sent: 03/21/2018   7:39 AM EDT To: Kathrine Haddock, NP

## 2018-03-23 NOTE — Telephone Encounter (Signed)
Please let her know that her cholesterol came back normal, but her iron came back lower than last time. I'd like her to do some stool tests at home to see if she's bleeding from her bottom and start some iron. We'll recheck it in 1 month. I've put the orders in, there's a stool test up front for her and her iron supplement has been sent to her pharmacy.

## 2018-03-23 NOTE — Telephone Encounter (Signed)
Patient notified

## 2018-03-24 NOTE — Telephone Encounter (Signed)
Pt went to [facility] on [address] and was told they did not receive the ferrous sulfate 325 (65 FE) MG tablet  That was sent in yesterday. Pt would like to see if this can be resent.

## 2018-03-25 NOTE — Telephone Encounter (Signed)
Spoke with [facility]. They did received RX but it is not covered by part B because it can bought OTC. Called and let patient know.

## 2018-04-06 ENCOUNTER — Ambulatory Visit (INDEPENDENT_AMBULATORY_CARE_PROVIDER_SITE_OTHER): Payer: PPO

## 2018-04-06 ENCOUNTER — Encounter: Payer: Self-pay | Admitting: Family Medicine

## 2018-04-06 DIAGNOSIS — D509 Iron deficiency anemia, unspecified: Secondary | ICD-10-CM | POA: Diagnosis not present

## 2018-04-06 LAB — IFOBT (OCCULT BLOOD)
IFOBT: NEGATIVE
SPECIMEN 1: NEGATIVE
SPECIMEN 2: NEGATIVE
Specimen 3: NEGATIVE

## 2018-04-08 ENCOUNTER — Encounter: Payer: Self-pay | Admitting: Internal Medicine

## 2018-04-08 ENCOUNTER — Ambulatory Visit: Payer: PPO | Admitting: Internal Medicine

## 2018-04-08 VITALS — BP 120/60 | HR 58 | Ht 63.0 in | Wt 159.5 lb

## 2018-04-08 DIAGNOSIS — R011 Cardiac murmur, unspecified: Secondary | ICD-10-CM | POA: Diagnosis not present

## 2018-04-08 DIAGNOSIS — R001 Bradycardia, unspecified: Secondary | ICD-10-CM

## 2018-04-08 NOTE — Patient Instructions (Signed)
Medication Instructions:  Your physician recommends that you continue on your current medications as directed. Please refer to the Current Medication list given to you today.   Labwork: none  Testing/Procedures: Your physician has requested that you have an echocardiogram. Echocardiography is a painless test that uses sound waves to create images of your heart. It provides your doctor with information about the size and shape of your heart and how well your heart's chambers and valves are working. This procedure takes approximately one hour. There are no restrictions for this procedure. You may get an IV, if needed, to receive an ultrasound enhancing agent through to better visualize your heart.    Follow-Up: Your physician recommends that you schedule a follow-up appointment in: as needed.   If you need a refill on your cardiac medications before your next appointment, please call your pharmacy.   Echocardiogram An echocardiogram, or echocardiography, uses sound waves (ultrasound) to produce an image of your heart. The echocardiogram is simple, painless, obtained within a short period of time, and offers valuable information to your health care provider. The images from an echocardiogram can provide information such as:  Evidence of coronary artery disease (CAD).  Heart size.  Heart muscle function.  Heart valve function.  Aneurysm detection.  Evidence of a past heart attack.  Fluid buildup around the heart.  Heart muscle thickening.  Assess heart valve function.  Tell a health care provider about:  Any allergies you have.  All medicines you are taking, including vitamins, herbs, eye drops, creams, and over-the-counter medicines.  Any problems you or family members have had with anesthetic medicines.  Any blood disorders you have.  Any surgeries you have had.  Any medical conditions you have.  Whether you are pregnant or may be pregnant. What happens before the  procedure? No special preparation is needed. Eat and drink normally. What happens during the procedure?  In order to produce an image of your heart, gel will be applied to your chest and a wand-like tool (transducer) will be moved over your chest. The gel will help transmit the sound waves from the transducer. The sound waves will harmlessly bounce off your heart to allow the heart images to be captured in real-time motion. These images will then be recorded.  You may need an IV to receive a medicine that improves the quality of the pictures. What happens after the procedure? You may return to your normal schedule including diet, activities, and medicines, unless your health care provider tells you otherwise. This information is not intended to replace advice given to you by your health care provider. Make sure you discuss any questions you have with your health care provider. Document Released: 07/26/2000 Document Revised: 03/16/2016 Document Reviewed: 04/05/2013 Elsevier Interactive Patient Education  2017 [employer]    

## 2018-04-08 NOTE — Progress Notes (Signed)
New Outpatient Visit Date: 04/08/2018  Referring Provider: Kathrine Haddock, NP [address], [place] [address]  Chief Complaint: Bradycardia  HPI:  Teresa [name] is a 72 y.o. female who is being seen today for the evaluation of bradycardia at the request of Ms. Wicker. She has a history of hypertension, hyperlipidemia, type 2 diabetes mellitus, stroke, hypothyroidism, and obesity.  She was noted to be bradycardic at routine visit with Ms. Wicker in early July.  Ms. Rohlfs notes that she has been told of low heart rates in the past, though most recently it was noted to be in the upper 40s.  She has been asymptomatic, denying lightheadedness, chest pain, shortness of breath, and palpitations.  She reports being exhausted after caring for her 36-monthold granddaughter, though she does not have any excessive fatigue.  She was told in the past that she had coronary artery disease, though she does not recall how this was diagnosed.  She was also told that she may have had a stroke in the past.  She had a stress test several years ago, but does not recall the results.  --------------------------------------------------------------------------------------------------  Cardiovascular History & Procedures: Cardiovascular Problems:  Sinus bradycardia  Risk Factors:  Stroke, hypertension, hyperlipidemia, diabetes, obesity, and age greater than 642 Cath/PCI:  None  CV Surgery:  None  EP Procedures and Devices:  None  Non-Invasive Evaluation(s):  Carotid Doppler (03/10/2006): Mild bilateral carotid plaquing without hemodynamically significant stenosis.  Antegrade vertebral artery flow.  Recent CV Pertinent Labs: Lab Results  Component Value Date   CHOL 100 03/20/2018   CHOL 147 07/04/2015   HDL 47 03/20/2018   LDLCALC 37 03/20/2018   TRIG 82 03/20/2018   TRIG 89 07/04/2015   K 5.0 03/20/2018   BUN 17 03/20/2018   CREATININE 0.92 03/20/2018     --------------------------------------------------------------------------------------------------  Past Medical History:  Diagnosis Date  . Diabetes mellitus without complication ([place]   . Hyperlipidemia   . Hypertension   . Lichen sclerosus   . Obesity   . Osteopenia   . Stroke ([place]   . Thyroid disease     Past Surgical History:  Procedure Laterality Date  . ROTATOR CUFF REPAIR Left   . 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      Current Meds  Medication Sig  . blood glucose meter kit and supplies Dispense based on patient and insurance preference. Use up to four times daily as directed. (FOR ICD-9 250.00, 250.01).  . clobetasol ointment (TEMOVATE) 0.05 % Apply topically 2 (two) times daily.  . clopidogrel (PLAVIX) 75 MG tablet Take 1 tablet (75 mg total) by mouth daily.  . ferrous sulfate 325 (65 FE) MG tablet Take 1 tablet (325 mg total) by mouth 2 (two) times daily with a meal.  . glucose blood (ONE TOUCH ULTRA TEST) test strip 1 each by Other route as needed for other. Use as instructed  . hydrochlorothiazide (HYDRODIURIL) 25 MG tablet Take 1 tablet (25 mg total) by mouth daily.  . Lancets MISC 1 each by Does not apply route daily.  .Marland Kitchenlevothyroxine (SYNTHROID, LEVOTHROID) 50 MCG tablet Take 1 tablet (50 mcg total) by mouth daily.  .Marland Kitchenlisinopril (PRINIVIL,ZESTRIL) 10 MG tablet Take 1 tablet (10 mg total) by mouth daily.  . metFORMIN (GLUCOPHAGE-XR) 500 MG 24 hr tablet Take 4 tablets (2,000 mg total) by mouth daily with breakfast.  . omeprazole (PRILOSEC) 20 MG capsule Take 1 capsule (20 mg total) by mouth daily.  . rosuvastatin (CRESTOR) 10 MG tablet  Take 1 tablet (10 mg total) by mouth daily.    Allergies: Atorvastatin and Evista [raloxifene]  Social History   Tobacco Use  . Smoking status: Former Smoker    Packs/day: 2.00    Types: Cigarettes    Last attempt to quit: 01/1965    Years since quitting: 53.2  . Smokeless tobacco: Never Used  Substance Use Topics  .  Alcohol use: No    Alcohol/week: 0.0 standard drinks  . Drug use: No    Family History  Problem Relation Age of Onset  . Hypertension Mother   . Thyroid disease Mother   . Glaucoma Mother   . Coronary artery disease Mother   . Stroke Father   . Diabetes Brother   . Hypertension Maternal Grandfather   . Breast cancer Neg Hx     Review of Systems: A 12-system review of systems was performed and was negative except as noted in the HPI.  --------------------------------------------------------------------------------------------------  Physical Exam: BP 120/60 (BP Location: Right Arm, Patient Position: Sitting, Cuff Size: Normal)   Pulse (!) 58   Ht '5\' 3"'$  (1.6 m)   Wt 159 lb 8 oz (72.3 kg)   LMP  (LMP Unknown)   BMI 28.25 kg/m   Heart rate with ambulation around the office increased to 87 bpm.  General: NAD. HEENT: No conjunctival pallor or scleral icterus. Moist mucous membranes. OP clear. Neck: Supple without lymphadenopathy, thyromegaly, JVD, or HJR. No carotid bruit. Lungs: Normal work of breathing. Clear to auscultation bilaterally without wheezes or crackles. Heart: R regular rate and rhythm with 2/6 systolic murmur loudest at the left upper sternal border.  No rubs or gallops.  Nondisplaced PMI. Abd: Bowel sounds present. Soft, NT/ND without hepatosplenomegaly Ext: No lower extremity edema. Radial, PT, and DP pulses are 2+ bilaterally Skin: Warm and dry without rash. Neuro: CNIII-XII intact. Strength and fine-touch sensation intact in upper and lower extremities bilaterally. Psych: Normal mood and affect.  EKG: Sinus bradycardia (heart rate 58 bpm) with borderline LVH.  Otherwise, no significant abnormalities  Lab Results  Component Value Date   WBC 7.6 03/20/2018   HGB 9.5 (L) 03/20/2018   HCT 30.1 (L) 03/20/2018   MCV 94 03/20/2018   PLT 247 03/20/2018    Lab Results  Component Value Date   NA 145 (H) 03/20/2018   K 5.0 03/20/2018   CL 108 (H) 03/20/2018    CO2 21 03/20/2018   BUN 17 03/20/2018   CREATININE 0.92 03/20/2018   GLUCOSE 121 (H) 03/20/2018   ALT 13 03/20/2018    Lab Results  Component Value Date   CHOL 100 03/20/2018   HDL 47 03/20/2018   LDLCALC 37 03/20/2018   TRIG 82 03/20/2018     --------------------------------------------------------------------------------------------------  ASSESSMENT AND PLAN: Sinus bradycardia Asymptomatic and quite mild today with a resting heart rate of 58 bpm.  EKG confirms sinus rhythm.  Heart rate responds appropriately with ambulation in the office.  Recent labs were unrevealing, with normal electrolytes and TSH.  Given murmur on exam and some fatigue when carrying all day for her granddaughter, I think it is reasonable to obtain a transthoracic echocardiogram.  If this is normal, I do not think any further evaluation is indicated unless symptoms develop.  Heart murmur Systolic murmur noted on exam.  We will obtain an echo, as outlined above.  Follow-up: To be determined based on results of echo.  If no significant structural abnormalities are identified, patient can follow-up on an as-needed basis.  Nelva Bush, MD 04/09/2018 9:48 PM

## 2018-04-09 ENCOUNTER — Encounter: Payer: Self-pay | Admitting: Internal Medicine

## 2018-04-16 DIAGNOSIS — H2511 Age-related nuclear cataract, right eye: Secondary | ICD-10-CM | POA: Diagnosis not present

## 2018-04-16 LAB — HM DIABETES EYE EXAM

## 2018-04-21 ENCOUNTER — Other Ambulatory Visit: Payer: PPO

## 2018-04-22 ENCOUNTER — Other Ambulatory Visit: Payer: Self-pay

## 2018-04-22 ENCOUNTER — Ambulatory Visit (INDEPENDENT_AMBULATORY_CARE_PROVIDER_SITE_OTHER): Payer: PPO

## 2018-04-22 DIAGNOSIS — R001 Bradycardia, unspecified: Secondary | ICD-10-CM

## 2018-04-22 DIAGNOSIS — R011 Cardiac murmur, unspecified: Secondary | ICD-10-CM | POA: Diagnosis not present

## 2018-04-23 ENCOUNTER — Other Ambulatory Visit: Payer: PPO

## 2018-04-23 DIAGNOSIS — D509 Iron deficiency anemia, unspecified: Secondary | ICD-10-CM | POA: Diagnosis not present

## 2018-04-24 ENCOUNTER — Telehealth: Payer: Self-pay | Admitting: Family Medicine

## 2018-04-24 LAB — CBC WITH DIFFERENTIAL/PLATELET
BASOS: 1 %
Basophils Absolute: 0 10*3/uL (ref 0.0–0.2)
EOS (ABSOLUTE): 0.1 10*3/uL (ref 0.0–0.4)
EOS: 1 %
HEMATOCRIT: 30.7 % — AB (ref 34.0–46.6)
HEMOGLOBIN: 10.1 g/dL — AB (ref 11.1–15.9)
Immature Grans (Abs): 0 10*3/uL (ref 0.0–0.1)
Immature Granulocytes: 0 %
LYMPHS ABS: 2.7 10*3/uL (ref 0.7–3.1)
Lymphs: 39 %
MCH: 30.1 pg (ref 26.6–33.0)
MCHC: 32.9 g/dL (ref 31.5–35.7)
MCV: 92 fL (ref 79–97)
MONOCYTES: 8 %
MONOS ABS: 0.6 10*3/uL (ref 0.1–0.9)
NEUTROS PCT: 51 %
Neutrophils Absolute: 3.7 10*3/uL (ref 1.4–7.0)
Platelets: 221 10*3/uL (ref 150–450)
RBC: 3.35 x10E6/uL — AB (ref 3.77–5.28)
RDW: 12.4 % (ref 12.3–15.4)
WBC: 7.1 10*3/uL (ref 3.4–10.8)

## 2018-04-24 LAB — FERRITIN: Ferritin: 39 ng/mL (ref 15–150)

## 2018-04-24 LAB — IRON AND TIBC
IRON: 62 ug/dL (ref 27–139)
Iron Saturation: 21 % (ref 15–55)
Total Iron Binding Capacity: 293 ug/dL (ref 250–450)
UIBC: 231 ug/dL (ref 118–369)

## 2018-04-24 NOTE — Telephone Encounter (Signed)
Please let her know that her blood count is improving on her iron. Keep taking her iron and we'll recheck it next visit.

## 2018-04-24 NOTE — Telephone Encounter (Signed)
Pt notified of labs results.

## 2018-05-26 ENCOUNTER — Ambulatory Visit (INDEPENDENT_AMBULATORY_CARE_PROVIDER_SITE_OTHER): Payer: PPO

## 2018-05-26 ENCOUNTER — Telehealth: Payer: Self-pay | Admitting: Family Medicine

## 2018-05-26 DIAGNOSIS — Z23 Encounter for immunization: Secondary | ICD-10-CM | POA: Diagnosis not present

## 2018-05-26 NOTE — Telephone Encounter (Signed)
Copied from [place] [id number]. Topic: Quick Communication - Rx Refill/Question >> May 26, 2018  8:43 AM Reyne Dumas L wrote: Medication: clobetasol ointment (TEMOVATE) 0.05 %  Medication is going to be over $90.  Pt wants to know if there is a generic she can have.  Preferred Pharmacy (with phone number or street name): [facility] [address] [place] [phone] (Phone) [phone] (Fax)

## 2018-05-26 NOTE — Telephone Encounter (Signed)
Husband aware. DPR was check first.

## 2018-05-26 NOTE — Telephone Encounter (Signed)
Unfortunately that is the generic. However, I have a coupon up front for her that she can get it at [facility] for $24. She can come pick it up.

## 2018-08-25 ENCOUNTER — Other Ambulatory Visit: Payer: Self-pay | Admitting: Unknown Physician Specialty

## 2018-11-16 ENCOUNTER — Other Ambulatory Visit: Payer: Self-pay | Admitting: Family Medicine

## 2018-11-16 NOTE — Telephone Encounter (Signed)
Appt scheduled

## 2018-11-16 NOTE — Telephone Encounter (Signed)
Copied from [place] [id number]. Topic: Quick Communication - Rx Refill/Question >> Nov 16, 2018  1:58 PM Scherrie Gerlach wrote: Medication: clopidogrel (PLAVIX) 75 MG tablet metFORMIN (GLUCOPHAGE-XR) 500 MG 24 hr tablet levothyroxine (SYNTHROID, LEVOTHROID) 50 MCG tablet hydrochlorothiazide (HYDRODIURIL) 25 MG tablet lisinopril (PRINIVIL,ZESTRIL) 10 MG tablet omeprazole (PRILOSEC) 20 MG capsule rosuvastatin (CRESTOR) 10 MG tablet [facility] [facility]) - [place], [place] [phone] (Phone) [phone] (Fax)  Pt states she is out and needs a 15 day of this  med clopidogrel (PLAVIX) 75 MG tablet Sent to [place], [place] [phone] (Phone) [phone] (Fax)

## 2018-11-16 NOTE — Telephone Encounter (Signed)
Needs follow up. OK to be virtual.

## 2018-11-17 MED ORDER — CLOPIDOGREL BISULFATE 75 MG PO TABS: 75.0000 mg | ORAL_TABLET | Freq: Every day | ORAL | 3 refills | Status: DC

## 2018-11-17 NOTE — Telephone Encounter (Signed)
Please see if she had enough to last until appointment tomorrow

## 2018-11-17 NOTE — Telephone Encounter (Signed)
Called and spoke to patient. She states that she is good on everything except Plavix. States she has not had it since Sunday. Needs it sent to [facility].

## 2018-11-18 ENCOUNTER — Encounter: Payer: Self-pay | Admitting: Family Medicine

## 2018-11-18 ENCOUNTER — Other Ambulatory Visit: Payer: Self-pay

## 2018-11-18 ENCOUNTER — Ambulatory Visit (INDEPENDENT_AMBULATORY_CARE_PROVIDER_SITE_OTHER): Payer: PPO | Admitting: Family Medicine

## 2018-11-18 VITALS — BP 124/59 | HR 56 | Temp 96.9°F

## 2018-11-18 DIAGNOSIS — E782 Mixed hyperlipidemia: Secondary | ICD-10-CM | POA: Diagnosis not present

## 2018-11-18 DIAGNOSIS — I129 Hypertensive chronic kidney disease with stage 1 through stage 4 chronic kidney disease, or unspecified chronic kidney disease: Secondary | ICD-10-CM | POA: Diagnosis not present

## 2018-11-18 DIAGNOSIS — E1122 Type 2 diabetes mellitus with diabetic chronic kidney disease: Secondary | ICD-10-CM

## 2018-11-18 DIAGNOSIS — N183 Chronic kidney disease, stage 3 unspecified: Secondary | ICD-10-CM

## 2018-11-18 DIAGNOSIS — E039 Hypothyroidism, unspecified: Secondary | ICD-10-CM | POA: Diagnosis not present

## 2018-11-18 DIAGNOSIS — E538 Deficiency of other specified B group vitamins: Secondary | ICD-10-CM | POA: Diagnosis not present

## 2018-11-18 MED ORDER — METFORMIN HCL ER 500 MG PO TB24: ORAL_TABLET | ORAL | 1 refills | Status: DC

## 2018-11-18 MED ORDER — HYDROCHLOROTHIAZIDE 25 MG PO TABS: 25.0000 mg | ORAL_TABLET | Freq: Every day | ORAL | 1 refills | Status: DC

## 2018-11-18 MED ORDER — OMEPRAZOLE 20 MG PO CPDR: 20.0000 mg | DELAYED_RELEASE_CAPSULE | Freq: Every day | ORAL | 3 refills | Status: DC

## 2018-11-18 MED ORDER — FERROUS SULFATE 325 (65 FE) MG PO TABS: 325.0000 mg | ORAL_TABLET | Freq: Two times a day (BID) | ORAL | 3 refills | Status: DC

## 2018-11-18 MED ORDER — ROSUVASTATIN CALCIUM 10 MG PO TABS: 10.0000 mg | ORAL_TABLET | Freq: Every day | ORAL | 1 refills | Status: DC

## 2018-11-18 MED ORDER — CLOPIDOGREL BISULFATE 75 MG PO TABS: 75.0000 mg | ORAL_TABLET | Freq: Every day | ORAL | 3 refills | Status: DC

## 2018-11-18 MED ORDER — LISINOPRIL 10 MG PO TABS: 10.0000 mg | ORAL_TABLET | Freq: Every day | ORAL | 1 refills | Status: DC

## 2018-11-18 NOTE — Assessment & Plan Note (Signed)
Under good control on current regimen. Continue current regimen. Continue to monitor. Call with any concerns. Refills given. Labs to be drawn.  

## 2018-11-18 NOTE — Assessment & Plan Note (Signed)
Will come in for recheck on blood work. Call with any concerns. Feeling stable. Refills given today.

## 2018-11-18 NOTE — Assessment & Plan Note (Signed)
Will get her in for labs ASAP. Call with any concerns.

## 2018-11-18 NOTE — Progress Notes (Signed)
BP (!) 124/59   Pulse (!) 56   Temp (!) 96.9 F (36.1 C)   LMP  (LMP Unknown)    Subjective:    Patient ID: Teresa [name], female    DOB: 1946/02/01, 73 y.o.   MRN: [id number]  HPI: Teresa [name] is a 73 y.o. female  Chief Complaint  Patient presents with  . Diabetes    Glucose 120  . Hyperlipidemia  . Hypertension  . TELEMEDICINE VISIT   HYPERTENSION / HYPERLIPIDEMIA Satisfied with current treatment? yes Duration of hypertension: chronic BP monitoring frequency: not checking BP medication side effects: no Past BP meds: lisinopril Duration of hyperlipidemia: chronic Cholesterol medication side effects: no Cholesterol supplements: none Past cholesterol medications: crestor Medication compliance: excellent compliance Aspirin: no Recent stressors: no Recurrent headaches: no Visual changes: no Palpitations: no Dyspnea: no Chest pain: no Lower extremity edema: no Dizzy/lightheaded: no  DIABETES Hypoglycemic episodes:no Polydipsia/polyuria: no Visual disturbance: no Chest pain: no Paresthesias: no Glucose Monitoring: no  Accucheck frequency: Daily  Fasting glucose: 110s Taking Insulin?: no Blood Pressure Monitoring: not checking Retinal Examination: up to date Foot Exam: Not up to Date Diabetic Education: Completed Pneumovax: Up to Date Influenza: Up to Date Aspirin: yes  HYPOTHYROIDISM Thyroid control status:controlled Satisfied with current treatment? yes Medication side effects: no Medication compliance: excellent compliance Recent dose adjustment:no Fatigue: no Cold intolerance: no Heat intolerance: no Weight gain: no Weight loss: no Constipation: no Diarrhea/loose stools: no Palpitations: no Lower extremity edema: no Anxiety/depressed mood: no   Relevant past medical, surgical, family and social history reviewed and updated as indicated. Interim medical history since our last visit reviewed. Allergies and medications reviewed  and updated.  Review of Systems  Constitutional: Negative.   Respiratory: Negative.   Cardiovascular: Negative.   Gastrointestinal: Negative.   Neurological: Negative.   Psychiatric/Behavioral: Negative.     Per HPI unless specifically indicated above     Objective:    BP (!) 124/59   Pulse (!) 56   Temp (!) 96.9 F (36.1 C)   LMP  (LMP Unknown)   Wt Readings from Last 3 Encounters:  04/08/18 159 lb 8 oz (72.3 kg)  03/20/18 159 lb 1 oz (72.2 kg)  02/11/18 154 lb 9.6 oz (70.1 kg)    Physical Exam Vitals signs and nursing note reviewed.  Constitutional:      General: She is not in acute distress.    Appearance: Normal appearance. She is not ill-appearing, toxic-appearing or diaphoretic.  HENT:     Head: Normocephalic and atraumatic.     Right Ear: External ear normal.     Left Ear: External ear normal.     Nose: Nose normal.     Mouth/Throat:     Mouth: Mucous membranes are moist.     Pharynx: Oropharynx is clear.  Eyes:     General: No scleral icterus.       Right eye: No discharge.        Left eye: No discharge.     Conjunctiva/sclera: Conjunctivae normal.     Pupils: Pupils are equal, round, and reactive to light.  Neck:     Musculoskeletal: Normal range of motion.  Pulmonary:     Effort: Pulmonary effort is normal. No respiratory distress.     Comments: Speaking in full sentences Musculoskeletal: Normal range of motion.  Skin:    Coloration: Skin is not jaundiced or pale.     Findings: No bruising, erythema, lesion or rash.  Neurological:  Mental Status: She is alert and oriented to person, place, and time. Mental status is at baseline.  Psychiatric:        Mood and Affect: Mood normal.        Behavior: Behavior normal.        Thought Content: Thought content normal.        Judgment: Judgment normal.     Results for orders placed or performed in visit on 04/27/18  HM DIABETES EYE EXAM  Result Value Ref Range   HM Diabetic Eye Exam No Retinopathy  No Retinopathy      Assessment & Plan:   Problem List Items Addressed This Visit      Endocrine   Hypothyroidism - Primary    Will come in for recheck on blood work. Call with any concerns. Feeling stable. Refills given today.       Relevant Orders   Comprehensive metabolic panel   UA/M w/rflx Culture, Routine   TSH   Type 2 diabetes mellitus with stage 3 chronic kidney disease, without long-term current use of insulin ([place])    Under good control on current regimen. Continue current regimen. Continue to monitor. Call with any concerns. Refills given. Labs to be drawn.       Relevant Medications   lisinopril (PRINIVIL,ZESTRIL) 10 MG tablet   metFORMIN (GLUCOPHAGE-XR) 500 MG 24 hr tablet   rosuvastatin (CRESTOR) 10 MG tablet   Other Relevant Orders   Bayer DCA Hb A1c Waived   Comprehensive metabolic panel   Microalbumin, Urine Waived   UA/M w/rflx Culture, Routine     Genitourinary   Hypertensive CKD (chronic kidney disease)    Under good control on current regimen. Continue current regimen. Continue to monitor. Call with any concerns. Refills given. Labs to be drawn.        Relevant Orders   Comprehensive metabolic panel   Microalbumin, Urine Waived   UA/M w/rflx Culture, Routine   CKD (chronic kidney disease), stage III ([place])    Will get her in for labs ASAP. Call with any concerns.       Relevant Orders   Comprehensive metabolic panel   Microalbumin, Urine Waived   UA/M w/rflx Culture, Routine     Other   Hyperlipidemia    Under good control on current regimen. Continue current regimen. Continue to monitor. Call with any concerns. Refills given. Labs to be drawn.       Relevant Medications   hydrochlorothiazide (HYDRODIURIL) 25 MG tablet   lisinopril (PRINIVIL,ZESTRIL) 10 MG tablet   rosuvastatin (CRESTOR) 10 MG tablet   Other Relevant Orders   Comprehensive metabolic panel   Lipid Panel w/o Chol/HDL Ratio   UA/M w/rflx Culture, Routine   B12 deficiency     Under good control on current regimen. Continue current regimen. Continue to monitor. Call with any concerns. Refills given. Labs to be drawn.       Relevant Orders   CBC with Differential/Platelet   Comprehensive metabolic panel   UA/M w/rflx Culture, Routine       Follow up plan: Return in about 6 months (around 05/20/2019) for Physical.    . This visit was begun via [employer], where patient was seen and examined. She could hear me, but I could not hear her- so the visit was changed over to a telephone visit due to the restrictions of the COVID-19 pandemic. All issues as above were discussed and addressed. Physical exam was done as above through visual confirmation on [employer]. If  it was felt that the patient should be evaluated in the office, they were directed there. The patient verbally consented to this visit. . Location of the patient: home . Location of the provider: home . Those involved with this call:  . Provider: Park Liter, DO . CMA: Tiffany Reel, CMA . Front Desk/Registration: Don Perking  . Time spent on call: 25 minutes on the phone discussing health concerns

## 2018-11-19 ENCOUNTER — Other Ambulatory Visit: Payer: Self-pay

## 2018-11-19 ENCOUNTER — Other Ambulatory Visit: Payer: PPO

## 2018-11-19 DIAGNOSIS — E1122 Type 2 diabetes mellitus with diabetic chronic kidney disease: Secondary | ICD-10-CM

## 2018-11-19 DIAGNOSIS — N183 Chronic kidney disease, stage 3 unspecified: Secondary | ICD-10-CM

## 2018-11-19 DIAGNOSIS — M6281 Muscle weakness (generalized): Secondary | ICD-10-CM | POA: Diagnosis not present

## 2018-11-19 DIAGNOSIS — L97211 Non-pressure chronic ulcer of right calf limited to breakdown of skin: Secondary | ICD-10-CM | POA: Diagnosis not present

## 2018-11-19 DIAGNOSIS — L89893 Pressure ulcer of other site, stage 3: Secondary | ICD-10-CM | POA: Diagnosis not present

## 2018-11-19 DIAGNOSIS — I10 Essential (primary) hypertension: Secondary | ICD-10-CM | POA: Diagnosis not present

## 2018-11-19 DIAGNOSIS — Z79891 Long term (current) use of opiate analgesic: Secondary | ICD-10-CM | POA: Diagnosis not present

## 2018-11-19 DIAGNOSIS — E538 Deficiency of other specified B group vitamins: Secondary | ICD-10-CM | POA: Diagnosis not present

## 2018-11-19 DIAGNOSIS — I509 Heart failure, unspecified: Secondary | ICD-10-CM | POA: Diagnosis not present

## 2018-11-19 DIAGNOSIS — Z9181 History of falling: Secondary | ICD-10-CM | POA: Diagnosis not present

## 2018-11-19 DIAGNOSIS — Z7901 Long term (current) use of anticoagulants: Secondary | ICD-10-CM | POA: Diagnosis not present

## 2018-11-19 DIAGNOSIS — I255 Ischemic cardiomyopathy: Secondary | ICD-10-CM | POA: Diagnosis not present

## 2018-11-19 DIAGNOSIS — E782 Mixed hyperlipidemia: Secondary | ICD-10-CM | POA: Diagnosis not present

## 2018-11-19 DIAGNOSIS — E039 Hypothyroidism, unspecified: Secondary | ICD-10-CM | POA: Diagnosis not present

## 2018-11-19 DIAGNOSIS — I890 Lymphedema, not elsewhere classified: Secondary | ICD-10-CM | POA: Diagnosis not present

## 2018-11-19 DIAGNOSIS — F329 Major depressive disorder, single episode, unspecified: Secondary | ICD-10-CM | POA: Diagnosis not present

## 2018-11-19 DIAGNOSIS — D485 Neoplasm of uncertain behavior of skin: Secondary | ICD-10-CM | POA: Diagnosis not present

## 2018-11-19 DIAGNOSIS — M797 Fibromyalgia: Secondary | ICD-10-CM | POA: Diagnosis not present

## 2018-11-19 DIAGNOSIS — I2510 Atherosclerotic heart disease of native coronary artery without angina pectoris: Secondary | ICD-10-CM | POA: Diagnosis not present

## 2018-11-19 DIAGNOSIS — R338 Other retention of urine: Secondary | ICD-10-CM | POA: Diagnosis not present

## 2018-11-19 DIAGNOSIS — N401 Enlarged prostate with lower urinary tract symptoms: Secondary | ICD-10-CM | POA: Diagnosis not present

## 2018-11-19 DIAGNOSIS — Z6832 Body mass index (BMI) 32.0-32.9, adult: Secondary | ICD-10-CM | POA: Diagnosis not present

## 2018-11-19 DIAGNOSIS — E669 Obesity, unspecified: Secondary | ICD-10-CM | POA: Diagnosis not present

## 2018-11-19 DIAGNOSIS — F0390 Unspecified dementia without behavioral disturbance: Secondary | ICD-10-CM | POA: Diagnosis not present

## 2018-11-19 DIAGNOSIS — R2689 Other abnormalities of gait and mobility: Secondary | ICD-10-CM | POA: Diagnosis not present

## 2018-11-19 DIAGNOSIS — Z95810 Presence of automatic (implantable) cardiac defibrillator: Secondary | ICD-10-CM | POA: Diagnosis not present

## 2018-11-19 DIAGNOSIS — I129 Hypertensive chronic kidney disease with stage 1 through stage 4 chronic kidney disease, or unspecified chronic kidney disease: Secondary | ICD-10-CM | POA: Diagnosis not present

## 2018-11-19 DIAGNOSIS — Z6831 Body mass index (BMI) 31.0-31.9, adult: Secondary | ICD-10-CM | POA: Diagnosis not present

## 2018-11-19 DIAGNOSIS — I252 Old myocardial infarction: Secondary | ICD-10-CM | POA: Diagnosis not present

## 2018-11-19 DIAGNOSIS — I872 Venous insufficiency (chronic) (peripheral): Secondary | ICD-10-CM | POA: Diagnosis not present

## 2018-11-19 DIAGNOSIS — I110 Hypertensive heart disease with heart failure: Secondary | ICD-10-CM | POA: Diagnosis not present

## 2018-11-19 DIAGNOSIS — Z86010 Personal history of colonic polyps: Secondary | ICD-10-CM | POA: Diagnosis not present

## 2018-11-19 LAB — MICROSCOPIC EXAMINATION
Bacteria, UA: NONE SEEN
RBC, Urine: NONE SEEN /hpf (ref 0–2)

## 2018-11-19 LAB — UA/M W/RFLX CULTURE, ROUTINE
Bilirubin, UA: NEGATIVE
Glucose, UA: NEGATIVE
Ketones, UA: NEGATIVE
Leukocytes,UA: NEGATIVE
Nitrite, UA: NEGATIVE
RBC, UA: NEGATIVE
Specific Gravity, UA: 1.02 (ref 1.005–1.030)
Urobilinogen, Ur: 0.2 mg/dL (ref 0.2–1.0)
pH, UA: 5 (ref 5.0–7.5)

## 2018-11-19 LAB — MICROALBUMIN, URINE WAIVED
Creatinine, Urine Waived: 100 mg/dL (ref 10–300)
Microalb, Ur Waived: 80 mg/L — ABNORMAL HIGH (ref 0–19)

## 2018-11-19 LAB — BAYER DCA HB A1C WAIVED: HB A1C (BAYER DCA - WAIVED): 6.5 % (ref <7.0)

## 2018-11-20 ENCOUNTER — Encounter: Payer: Self-pay | Admitting: Family Medicine

## 2018-11-20 LAB — LIPID PANEL W/O CHOL/HDL RATIO
Cholesterol, Total: 115 mg/dL (ref 100–199)
HDL: 49 mg/dL (ref >39)
LDL Calculated: 50 mg/dL (ref 0–99)
Triglycerides: 78 mg/dL (ref 0–149)
VLDL Cholesterol Cal: 16 mg/dL (ref 5–40)

## 2018-11-20 LAB — CBC WITH DIFFERENTIAL/PLATELET
Basophils Absolute: 0.1 10*3/uL (ref 0.0–0.2)
Basos: 1 %
EOS (ABSOLUTE): 0.1 10*3/uL (ref 0.0–0.4)
Eos: 1 %
Hematocrit: 29.6 % — ABNORMAL LOW (ref 34.0–46.6)
Hemoglobin: 9.9 g/dL — ABNORMAL LOW (ref 11.1–15.9)
Immature Grans (Abs): 0 10*3/uL (ref 0.0–0.1)
Immature Granulocytes: 0 %
Lymphocytes Absolute: 2.4 10*3/uL (ref 0.7–3.1)
Lymphs: 35 %
MCH: 30.6 pg (ref 26.6–33.0)
MCHC: 33.4 g/dL (ref 31.5–35.7)
MCV: 91 fL (ref 79–97)
Monocytes Absolute: 0.6 10*3/uL (ref 0.1–0.9)
Monocytes: 9 %
Neutrophils Absolute: 3.7 10*3/uL (ref 1.4–7.0)
Neutrophils: 54 %
Platelets: 261 10*3/uL (ref 150–450)
RBC: 3.24 x10E6/uL — ABNORMAL LOW (ref 3.77–5.28)
RDW: 11.9 % (ref 11.7–15.4)
WBC: 6.8 10*3/uL (ref 3.4–10.8)

## 2018-11-20 LAB — COMPREHENSIVE METABOLIC PANEL
ALT: 11 IU/L (ref 0–32)
AST: 14 IU/L (ref 0–40)
Albumin/Globulin Ratio: 1.5 (ref 1.2–2.2)
Albumin: 4.2 g/dL (ref 3.7–4.7)
Alkaline Phosphatase: 71 IU/L (ref 39–117)
BUN/Creatinine Ratio: 20 (ref 12–28)
BUN: 22 mg/dL (ref 8–27)
Bilirubin Total: 0.2 mg/dL (ref 0.0–1.2)
CO2: 22 mmol/L (ref 20–29)
Calcium: 9.7 mg/dL (ref 8.7–10.3)
Chloride: 107 mmol/L — ABNORMAL HIGH (ref 96–106)
Creatinine, Ser: 1.09 mg/dL — ABNORMAL HIGH (ref 0.57–1.00)
GFR calc Af Amer: 59 mL/min/{1.73_m2} — ABNORMAL LOW (ref >59)
GFR calc non Af Amer: 51 mL/min/{1.73_m2} — ABNORMAL LOW (ref >59)
Globulin, Total: 2.8 g/dL (ref 1.5–4.5)
Glucose: 116 mg/dL — ABNORMAL HIGH (ref 65–99)
Potassium: 5.3 mmol/L — ABNORMAL HIGH (ref 3.5–5.2)
Sodium: 145 mmol/L — ABNORMAL HIGH (ref 134–144)
Total Protein: 7 g/dL (ref 6.0–8.5)

## 2018-11-20 LAB — TSH: TSH: 2.12 u[IU]/mL (ref 0.450–4.500)

## 2018-11-20 MED ORDER — FERROUS SULFATE 325 (65 FE) MG PO TABS: 325.0000 mg | ORAL_TABLET | Freq: Two times a day (BID) | ORAL | 3 refills | Status: DC

## 2018-12-01 ENCOUNTER — Other Ambulatory Visit: Payer: Self-pay | Admitting: Family Medicine

## 2019-01-21 ENCOUNTER — Other Ambulatory Visit: Payer: Self-pay | Admitting: Family Medicine

## 2019-02-08 ENCOUNTER — Ambulatory Visit: Payer: PPO

## 2019-02-17 ENCOUNTER — Ambulatory Visit (INDEPENDENT_AMBULATORY_CARE_PROVIDER_SITE_OTHER): Payer: PPO

## 2019-02-17 ENCOUNTER — Other Ambulatory Visit: Payer: Self-pay

## 2019-02-17 VITALS — BP 122/64 | HR 50 | Temp 97.5°F | Resp 16 | Ht 60.0 in | Wt 165.1 lb

## 2019-02-17 DIAGNOSIS — Z0000 Encounter for general adult medical examination without abnormal findings: Secondary | ICD-10-CM | POA: Diagnosis not present

## 2019-02-17 DIAGNOSIS — Z1239 Encounter for other screening for malignant neoplasm of breast: Secondary | ICD-10-CM

## 2019-02-17 NOTE — Progress Notes (Signed)
Subjective:   Teresa [name] is a 73 y.o. female who presents for Medicare Annual (Subsequent) preventive examination.  Review of Systems:  Cardiac Risk Factors include: advanced age (>39mn, >>52women);diabetes mellitus;dyslipidemia;hypertension     Objective:     Vitals: BP 122/64 (BP Location: Right Arm, Patient Position: Sitting, Cuff Size: Normal)   Pulse (!) 50   Temp (!) 97.5 F (36.4 C) (Temporal)   Wt 165 lb 1.6 oz (74.9 kg)   LMP  (LMP Unknown)   BMI 29.25 kg/m   Body mass index is 29.25 kg/m.  Advanced Directives 02/17/2019 02/04/2018 09/27/2017 09/26/2017 01/22/2017 01/08/2017 01/03/2016  Does Patient Have a Medical Advance Directive? _0  No No  Does patient want to make changes to medical advance directive? - No - Patient declined - - - - -  Would patient like information on creating a medical advance directive? Yes (MAU/Ambulatory/Procedural Areas - Information given) - No - Patient declined No - Patient declined Yes (Inpatient - patient requests chaplain consult to create a medical advance directive) Yes (MAU/Ambulatory/Procedural Areas - Information given) No - patient declined information    Tobacco Social History   Tobacco Use  Smoking Status Former Smoker  . Packs/day: 2.00  . Types: Cigarettes  . Quit date: 01/1965  . Years since quitting: 54.1  Smokeless Tobacco Never Used     Counseling given: Not Answered   Clinical Intake:  Pre-visit preparation completed: Yes  Pain : 0-10 Pain Score: 5  Pain Type: Chronic pain Pain Location: Back Pain Orientation: Lower Pain Descriptors / Indicators: Aching Pain Onset: More than a month ago Pain Frequency: Intermittent Pain Relieving Factors: sitting  Pain Relieving Factors: sitting  Nutritional Status: BMI 25 -29 Overweight Nutritional Risks: None Diabetes: Yes CBG done?: No Did pt. bring in CBG monitor from home?: No  How often do you need to have someone help you when you read  instructions, pamphlets, or other written materials from your doctor or pharmacy?: 1 - Never  Nutrition Risk Assessment:  Has the patient had any N/V/D within the last 2 months?  No  Does the patient have any non-healing wounds?  No  Has the patient had any unintentional weight loss or weight gain?  No   Diabetes:  Is the patient diabetic?  Yes  If diabetic, was a CBG obtained today?  No  Did the patient bring in their glucometer from home?  No  How often do you monitor your CBG's? daily.   Financial Strains and Diabetes Management:  Are you having any financial strains with the device, your supplies or your medication? No .  Does the patient want to be seen by Chronic Care Management for management of their diabetes?  No  Would the patient like to be referred to a Nutritionist or for Diabetic Management?  No   Diabetic Exams:  Diabetic Eye Exam: Completed 04/16/2018.   Diabetic Foot Exam: due, will get at next visit.       Information entered by :: Zvi Duplantis,LPN  Past Medical History:  Diagnosis Date  . Diabetes mellitus without complication ([facility]   . Hyperlipidemia   . Hypertension   . Lichen sclerosus   . Obesity   . Osteopenia   . Stroke ([facility]   . Thyroid disease    Past Surgical History:  Procedure Laterality Date  . ROTATOR CUFF REPAIR Left   . 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     Family History  Problem Relation  Age of Onset  . Hypertension Mother   . Thyroid disease Mother   . Glaucoma Mother   . Coronary artery disease Mother   . Stroke Father   . Diabetes Brother   . Hypertension Maternal Grandfather   . Breast cancer Neg Hx    Social History   Socioeconomic History  . Marital status: Married    Spouse name: Not on file  . Number of children: Not on file  . Years of education: Not on file  . Highest education level: High school graduate  Occupational History  . Occupation: retired  Scientific laboratory technician  . Financial resource strain: Not hard at all   . Food insecurity    Worry: Never true    Inability: Never true  . Transportation needs    Medical: No    Non-medical: No  Tobacco Use  . Smoking status: Former Smoker    Packs/day: 2.00    Types: Cigarettes    Quit date: 01/1965    Years since quitting: 54.1  . Smokeless tobacco: Never Used  Substance and Sexual Activity  . Alcohol use: No    Alcohol/week: 0.0 standard drinks  . Drug use: No  . Sexual activity: Never  Lifestyle  . Physical activity    Days per week: 0 days    Minutes per session: 0 min  . Stress: Not at all  Relationships  . Social Herbalist on phone: Once a week    Gets together: More than three times a week    Attends religious service: 1 to 4 times per year    Active member of club or organization: No    Attends meetings of clubs or organizations: Never    Relationship status: Married  Other Topics Concern  . Not on file  Social History Narrative  . Not on file    Outpatient Encounter Medications as of 02/17/2019  Medication Sig  . blood glucose meter kit and supplies Dispense based on patient and insurance preference. Use up to four times daily as directed. (FOR ICD-9 250.00, 250.01).  . clobetasol ointment (TEMOVATE) 0.05 % Apply topically 2 (two) times daily.  . clopidogrel (PLAVIX) 75 MG tablet Take 1 tablet (75 mg total) by mouth daily.  . ferrous sulfate 325 (65 FE) MG tablet Take 1 tablet (325 mg total) by mouth 2 (two) times daily with a meal.  . glucose blood (ONE TOUCH ULTRA TEST) test strip 1 each by Other route as needed for other. Use as instructed  . hydrochlorothiazide (HYDRODIURIL) 25 MG tablet Take 1 tablet (25 mg total) by mouth daily.  . Lancets MISC 1 each by Does not apply route daily.  Marland Kitchen levothyroxine (SYNTHROID) 50 MCG tablet Take 1 tablet by mouth once daily  . lisinopril (PRINIVIL,ZESTRIL) 10 MG tablet Take 1 tablet (10 mg total) by mouth daily.  . metFORMIN (GLUCOPHAGE-XR) 500 MG 24 hr tablet Take 4 tablets  (2,01m total) by mouth daily with breakfast.  . rosuvastatin (CRESTOR) 10 MG tablet Take 1 tablet (10 mg total) by mouth daily.  . [DISCONTINUED] omeprazole (PRILOSEC) 20 MG capsule Take 1 capsule (20 mg total) by mouth daily. (Patient not taking: Reported on 02/17/2019)   No facility-administered encounter medications on file as of 02/17/2019.     Activities of Daily Living In your present state of health, do you have any difficulty performing the following activities: 02/17/2019  Hearing? N  Vision? N  Difficulty concentrating or making decisions? N  Walking  or climbing stairs? Y  Comment flights of stairs  Dressing or bathing? N  Doing errands, shopping? N  Preparing Food and eating ? N  Using the Toilet? N  In the past six months, have you accidently leaked urine? N  Do you have problems with loss of bowel control? N  Managing your Medications? N  Managing your Finances? N  Housekeeping or managing your Housekeeping? N  Some recent data might be hidden    Patient Care Team: [name], DO as PCP - General (Family Medicine) Kathrine Haddock, NP as PCP - Family Medicine (Nurse Practitioner) Sharlet Salina, MD as Referring Physician (Physical Medicine and Rehabilitation)    Assessment:   This is a routine wellness examination for [place].  Exercise Activities and Dietary recommendations Current Exercise Habits: Home exercise routine, Type of exercise: walking, Intensity: Mild, Exercise limited by: None identified  Goals    . DIET - INCREASE WATER INTAKE     Recommend drinking at least 6-8 glasses of water a day     . Increase water intake     Recommend drinking at least 4-5 glasses of water a day.        Fall Risk: Fall Risk  02/17/2019 11/18/2018 02/05/2018 02/04/2018 08/06/2017  Falls in the past year? 0 0 No No No  Number falls in past yr: - 0 - - -  Injury with Fall? - 0 - - -  Comment - - - - -    FALL RISK PREVENTION PERTAINING TO THE HOME:  Any stairs in  or around the home? Yes  If so, are there any without handrails? No   Home free of loose throw rugs in walkways, pet beds, electrical cords, etc? Yes  Adequate lighting in your home to reduce risk of falls? Yes   ASSISTIVE DEVICES UTILIZED TO PREVENT FALLS:  Life alert? No  Use of a cane, walker or w/c? No  Grab bars in the bathroom? Yes  Shower chair or bench in shower? No  Elevated toilet seat or a handicapped toilet? No   TIMED UP AND GO:  Was the test performed? Yes .  Length of time to ambulate 10 feet: 11 sec.   GAIT:  Appearance of gait: Gait steady and fast without the use of an assistive device.  Education: Fall risk prevention has been discussed.  Intervention(s) required? No   DME/home health order needed?  No    Depression Screen PHQ 2/9 Scores 02/17/2019 02/04/2018 08/06/2017 01/08/2017  PHQ - 2 Score 1 1 0 0  PHQ- 9 Score - 3 - 2     Cognitive Function     6CIT Screen 02/17/2019 02/04/2018 01/08/2017  What Year? 0 points 0 points 0 points  What month? 0 points 0 points 0 points  What time? 0 points 0 points 0 points  Count back from 20 0 points 0 points 0 points  Months in reverse 0 points 0 points 0 points  Repeat phrase 0 points 0 points 2 points  Total Score 0 0 2    Immunization History  Administered Date(s) Administered  . Influenza, High Dose Seasonal PF 07/10/2016, 04/18/2017, 05/26/2018  . Influenza,inj,Quad PF,6+ Mos 07/04/2015  . Pneumococcal Conjugate-13 04/13/2014  . Pneumococcal Polysaccharide-23 10/14/2014  . Td 03/25/2003  . Tdap 10/05/2010  . Zoster 09/10/2006    Qualifies for Shingles Vaccine? Yes  Zostavax completed 09/10/2006. Due for Shingrix. Education has been provided regarding the importance of this vaccine. Pt has been advised to  call insurance company to determine out of pocket expense. Advised may also receive vaccine at local pharmacy or Health Dept. Verbalized acceptance and understanding.  Tdap: up to date   Flu  Vaccine:up to date   Pneumococcal Vaccine: up to date   Screening Tests Health Maintenance  Topic Date Due  . FOOT EXAM  07/10/2017  . INFLUENZA VACCINE  03/13/2019  . OPHTHALMOLOGY EXAM  04/17/2019  . HEMOGLOBIN A1C  05/21/2019  . MAMMOGRAM  02/27/2020  . TETANUS/TDAP  10/05/2020  . COLONOSCOPY  04/01/2024  . DEXA SCAN  Completed  . Hepatitis C Screening  Completed  . PNA vac Low Risk Adult  Completed    Cancer Screenings:  Colorectal Screening: Completed 04/01/2014. Repeat every 10 years  Mammogram: Completed 02/26/2018. Repeat every year;   Bone Density: Completed 01/22/2016  Lung Cancer Screening: (Low Dose CT Chest recommended if Age 74-80 years, 30 pack-year currently smoking OR have quit w/in 15years.) does not qualify.     Additional Screening:  Hepatitis C Screening: does qualify; Completed 01/03/2016  Dental Screening: Recommended annual dental exams for proper oral hygiene   Community Resource Referral:  CRR required this visit?  No       Plan:  I have personally reviewed and addressed the Medicare Annual Wellness questionnaire and have noted the following in the patient's chart:  A. Medical and social history B. Use of alcohol, tobacco or illicit drugs  C. Current medications and supplements D. Functional ability and status E.  Nutritional status F.  Physical activity G. Advance directives H. List of other physicians I.  Hospitalizations, surgeries, and ER visits in previous 12 months J.  [place] such as hearing and vision if needed, cognitive and depression L. Referrals and appointments   In addition, I have reviewed and discussed with patient certain preventive protocols, quality metrics, and best practice recommendations. A written personalized care plan for preventive services as well as general preventive health recommendations were provided to patient.   Signed,    Bevelyn Ngo, LPN  08/12/7354 Nurse Health Advisor    Nurse Notes: requesting prescription for clobetasol ointment,  Will send in-basket to Dr.Ricci.

## 2019-02-17 NOTE — Patient Instructions (Addendum)
Teresa [name] , Thank you for taking time to come for your Medicare Wellness Visit. I appreciate your ongoing commitment to your health goals. Please review the following plan we discussed and let me know if I can assist you in the future.   Screening recommendations/referrals: Colonoscopy: completed 04/01/2014 Mammogram: completed 02/26/2018 Please call [phone] to schedule your mammogram.  Bone Density: completed 01/22/2016 Recommended yearly ophthalmology/optometry visit for glaucoma screening and checkup Recommended yearly dental visit for hygiene and checkup  Vaccinations: Influenza vaccine: up to date  Pneumococcal vaccine: up to date  Tdap vaccine: up to date Shingles vaccine: shingrix eligible, check with your insurance company for coverage   Advanced directives: Advance directive discussed with you today. I have provided a copy for you to complete at home and have notarized. Once this is complete please bring a copy in to our office so we can scan it into your chart.  Conditions/risks identified: diabetic - chronic care management team discussed  Next appointment: follow up in one year for your annual wellness exam.    Preventive Care 73 Years and Older, Female Preventive care refers to lifestyle choices and visits with your health care provider that can promote health and wellness. What does preventive care include?  A yearly physical exam. This is also called an annual well check.  Dental exams once or twice a year.  Routine eye exams. Ask your health care provider how often you should have your eyes checked.  Personal lifestyle choices, including:  Daily care of your teeth and gums.  Regular physical activity.  Eating a healthy diet.  Avoiding tobacco and drug use.  Limiting alcohol use.  Practicing safe sex.  Taking low-dose aspirin every day.  Taking vitamin and mineral supplements as recommended by your health care provider. What happens during an  annual well check? The services and screenings done by your health care provider during your annual well check will depend on your age, overall health, lifestyle risk factors, and family history of disease. Counseling  Your health care provider may ask you questions about your:  Alcohol use.  Tobacco use.  Drug use.  Emotional well-being.  Home and relationship well-being.  Sexual activity.  Eating habits.  History of falls.  Memory and ability to understand (cognition).  Work and work Statistician.  Reproductive health. Screening  You may have the following tests or measurements:  Height, weight, and BMI.  Blood pressure.  Lipid and cholesterol levels. These may be checked every 5 years, or more frequently if you are over 73 years old.  Skin check.  Lung cancer screening. You may have this screening every year starting at age 73 if you have a 30-pack-year history of smoking and currently smoke or have quit within the past 15 years.  Fecal occult blood test (FOBT) of the stool. You may have this test every year starting at age 73.  Flexible sigmoidoscopy or colonoscopy. You may have a sigmoidoscopy every 5 years or a colonoscopy every 10 years starting at age 73.  Hepatitis C blood test.  Hepatitis B blood test.  Sexually transmitted disease (STD) testing.  Diabetes screening. This is done by checking your blood sugar (glucose) after you have not eaten for a while (fasting). You may have this done every 1-3 years.  Bone density scan. This is done to screen for osteoporosis. You may have this done starting at age 73.  Mammogram. This may be done every 1-2 years. Talk to your health care provider about how  often you should have regular mammograms. Talk with your health care provider about your test results, treatment options, and if necessary, the need for more tests. Vaccines  Your health care provider may recommend certain vaccines, such as:  Influenza  vaccine. This is recommended every year.  Tetanus, diphtheria, and acellular pertussis (Tdap, Td) vaccine. You may need a Td booster every 10 years.  Zoster vaccine. You may need this after age 51.  Pneumococcal 13-valent conjugate (PCV13) vaccine. One dose is recommended after age 73.  Pneumococcal polysaccharide (PPSV23) vaccine. One dose is recommended after age 73. Talk to your health care provider about which screenings and vaccines you need and how often you need them. This information is not intended to replace advice given to you by your health care provider. Make sure you discuss any questions you have with your health care provider. Document Released: 08/25/2015 Document Revised: 04/17/2016 Document Reviewed: 05/30/2015 Elsevier Interactive Patient Education  2017 [place] Prevention in the Home Falls can cause injuries. They can happen to people of all ages. There are many things you can do to make your home safe and to help prevent falls. What can I do on the outside of my home?  Regularly fix the edges of walkways and driveways and fix any cracks.  Remove anything that might make you trip as you walk through a door, such as a raised step or threshold.  Trim any bushes or trees on the path to your home.  Use bright outdoor lighting.  Clear any walking paths of anything that might make someone trip, such as rocks or tools.  Regularly check to see if handrails are loose or broken. Make sure that both sides of any steps have handrails.  Any raised decks and porches should have guardrails on the edges.  Have any leaves, snow, or ice cleared regularly.  Use sand or salt on walking paths during winter.  Clean up any spills in your garage right away. This includes oil or grease spills. What can I do in the bathroom?  Use night lights.  Install grab bars by the toilet and in the tub and shower. Do not use towel bars as grab bars.  Use non-skid mats or decals  in the tub or shower.  If you need to sit down in the shower, use a plastic, non-slip stool.  Keep the floor dry. Clean up any water that spills on the floor as soon as it happens.  Remove soap buildup in the tub or shower regularly.  Attach bath mats securely with double-sided non-slip rug tape.  Do not have throw rugs and other things on the floor that can make you trip. What can I do in the bedroom?  Use night lights.  Make sure that you have a light by your bed that is easy to reach.  Do not use any sheets or blankets that are too big for your bed. They should not hang down onto the floor.  Have a firm chair that has side arms. You can use this for support while you get dressed.  Do not have throw rugs and other things on the floor that can make you trip. What can I do in the kitchen?  Clean up any spills right away.  Avoid walking on wet floors.  Keep items that you use a lot in easy-to-reach places.  If you need to reach something above you, use a strong step stool that has a grab bar.  Keep electrical  cords out of the way.  Do not use floor polish or wax that makes floors slippery. If you must use wax, use non-skid floor wax.  Do not have throw rugs and other things on the floor that can make you trip. What can I do with my stairs?  Do not leave any items on the stairs.  Make sure that there are handrails on both sides of the stairs and use them. Fix handrails that are broken or loose. Make sure that handrails are as long as the stairways.  Check any carpeting to make sure that it is firmly attached to the stairs. Fix any carpet that is loose or worn.  Avoid having throw rugs at the top or bottom of the stairs. If you do have throw rugs, attach them to the floor with carpet tape.  Make sure that you have a light switch at the top of the stairs and the bottom of the stairs. If you do not have them, ask someone to add them for you. What else can I do to help  prevent falls?  Wear shoes that:  Do not have high heels.  Have rubber bottoms.  Are comfortable and fit you well.  Are closed at the toe. Do not wear sandals.  If you use a stepladder:  Make sure that it is fully opened. Do not climb a closed stepladder.  Make sure that both sides of the stepladder are locked into place.  Ask someone to hold it for you, if possible.  Clearly mark and make sure that you can see:  Any grab bars or handrails.  First and last steps.  Where the edge of each step is.  Use tools that help you move around (mobility aids) if they are needed. These include:  Canes.  Walkers.  Scooters.  Crutches.  Turn on the lights when you go into a dark area. Replace any light bulbs as soon as they burn out.  Set up your furniture so you have a clear path. Avoid moving your furniture around.  If any of your floors are uneven, fix them.  If there are any pets around you, be aware of where they are.  Review your medicines with your doctor. Some medicines can make you feel dizzy. This can increase your chance of falling. Ask your doctor what other things that you can do to help prevent falls. This information is not intended to replace advice given to you by your health care provider. Make sure you discuss any questions you have with your health care provider. Document Released: 05/25/2009 Document Revised: 01/04/2016 Document Reviewed: 09/02/2014 Elsevier Interactive Patient Education  2017 [employer].

## 2019-02-18 ENCOUNTER — Telehealth: Payer: Self-pay | Admitting: Family Medicine

## 2019-02-18 ENCOUNTER — Other Ambulatory Visit: Payer: Self-pay | Admitting: Family Medicine

## 2019-02-18 DIAGNOSIS — Z1231 Encounter for screening mammogram for malignant neoplasm of breast: Secondary | ICD-10-CM

## 2019-02-18 MED ORDER — CLOBETASOL PROPIONATE 0.05 % EX OINT: TOPICAL_OINTMENT | Freq: Two times a day (BID) | CUTANEOUS | 3 refills | Status: DC

## 2019-02-18 NOTE — Telephone Encounter (Signed)
-----   Message from Bevelyn Ngo, LPN sent at 0/02/1218 11:33 AM EDT ----- Regarding: refill request Patient requesting refill on clobetasol ointment for [facility] in [place] and requested good rx card. Provided one for [facility] and [facility] just in case.   Thanks,  [employer]

## 2019-02-19 ENCOUNTER — Telehealth: Payer: Self-pay | Admitting: Family Medicine

## 2019-02-19 DIAGNOSIS — M5416 Radiculopathy, lumbar region: Secondary | ICD-10-CM

## 2019-02-19 NOTE — Telephone Encounter (Signed)
Referral to ortho made

## 2019-02-19 NOTE — Telephone Encounter (Signed)
Copied from [place] [id number]. Topic: General - Other >> Feb 19, 2019 10:29 AM Leward Quan A wrote: Reason for CRM: Patient called to say that she need to be referred to an Orthopedic doctor so that she can get an injection in her back. Ph# [phone]

## 2019-03-03 DIAGNOSIS — M47896 Other spondylosis, lumbar region: Secondary | ICD-10-CM | POA: Diagnosis not present

## 2019-03-03 DIAGNOSIS — I10 Essential (primary) hypertension: Secondary | ICD-10-CM | POA: Diagnosis not present

## 2019-03-03 DIAGNOSIS — Z6829 Body mass index (BMI) 29.0-29.9, adult: Secondary | ICD-10-CM | POA: Diagnosis not present

## 2019-03-19 DIAGNOSIS — M48062 Spinal stenosis, lumbar region with neurogenic claudication: Secondary | ICD-10-CM | POA: Diagnosis not present

## 2019-03-19 DIAGNOSIS — M5416 Radiculopathy, lumbar region: Secondary | ICD-10-CM | POA: Diagnosis not present

## 2019-03-19 DIAGNOSIS — M5136 Other intervertebral disc degeneration, lumbar region: Secondary | ICD-10-CM | POA: Diagnosis not present

## 2019-03-31 ENCOUNTER — Ambulatory Visit
Admission: RE | Admit: 2019-03-31 | Discharge: 2019-03-31 | Disposition: A | Discharge status: Home / Self Care | Payer: PPO | Source: Ambulatory Visit | Attending: Family Medicine | Admitting: Family Medicine

## 2019-03-31 ENCOUNTER — Other Ambulatory Visit: Payer: Self-pay

## 2019-03-31 DIAGNOSIS — Z1231 Encounter for screening mammogram for malignant neoplasm of breast: Secondary | ICD-10-CM | POA: Diagnosis not present

## 2019-04-01 ENCOUNTER — Encounter: Payer: Self-pay | Admitting: Family Medicine

## 2019-04-20 ENCOUNTER — Other Ambulatory Visit: Payer: Self-pay

## 2019-04-20 NOTE — Telephone Encounter (Signed)
Patient last seen 11/18/18 and has appointment 05/20/19.

## 2019-04-21 DIAGNOSIS — M5136 Other intervertebral disc degeneration, lumbar region: Secondary | ICD-10-CM | POA: Diagnosis not present

## 2019-04-21 DIAGNOSIS — M48062 Spinal stenosis, lumbar region with neurogenic claudication: Secondary | ICD-10-CM | POA: Diagnosis not present

## 2019-04-21 DIAGNOSIS — M5416 Radiculopathy, lumbar region: Secondary | ICD-10-CM | POA: Diagnosis not present

## 2019-04-21 MED ORDER — ROSUVASTATIN CALCIUM 10 MG PO TABS: 10.0000 mg | ORAL_TABLET | Freq: Every day | ORAL | 0 refills | Status: DC

## 2019-04-21 MED ORDER — HYDROCHLOROTHIAZIDE 25 MG PO TABS: 25.0000 mg | ORAL_TABLET | Freq: Every day | ORAL | 0 refills | Status: DC

## 2019-04-21 MED ORDER — LISINOPRIL 10 MG PO TABS: 10.0000 mg | ORAL_TABLET | Freq: Every day | ORAL | 0 refills | Status: DC

## 2019-04-21 MED ORDER — METFORMIN HCL ER 500 MG PO TB24: ORAL_TABLET | ORAL | 0 refills | Status: DC

## 2019-04-26 ENCOUNTER — Telehealth: Payer: Self-pay | Admitting: Family Medicine

## 2019-04-26 NOTE — Telephone Encounter (Signed)
Pt stated her husband was on hydrochlorothiazide (HYDRODIURIL) 25 MG tablet and he was taken off due   his kidney function being down. Pt wants a call from the nurse or Dr. Wynetta Emery about this medication and why she is taking it and if something can replace it

## 2019-04-26 NOTE — Telephone Encounter (Signed)
Her kidney function was stable when we last checked and her blood pressure was under good control. She does not need to come off of that medicine- we can discuss changing her to something else at her appointment in October if she is still concerned about it.

## 2019-04-26 NOTE — Telephone Encounter (Signed)
Patient notified

## 2019-05-20 ENCOUNTER — Ambulatory Visit (INDEPENDENT_AMBULATORY_CARE_PROVIDER_SITE_OTHER): Payer: PPO | Admitting: Family Medicine

## 2019-05-20 ENCOUNTER — Encounter: Payer: Self-pay | Admitting: Family Medicine

## 2019-05-20 ENCOUNTER — Other Ambulatory Visit: Payer: Self-pay

## 2019-05-20 VITALS — BP 122/54 | HR 52 | Temp 98.4°F | Ht 65.0 in | Wt 158.0 lb

## 2019-05-20 DIAGNOSIS — N1831 Chronic kidney disease, stage 3a: Secondary | ICD-10-CM | POA: Diagnosis not present

## 2019-05-20 DIAGNOSIS — E782 Mixed hyperlipidemia: Secondary | ICD-10-CM

## 2019-05-20 DIAGNOSIS — I129 Hypertensive chronic kidney disease with stage 1 through stage 4 chronic kidney disease, or unspecified chronic kidney disease: Secondary | ICD-10-CM | POA: Diagnosis not present

## 2019-05-20 DIAGNOSIS — R8281 Pyuria: Secondary | ICD-10-CM

## 2019-05-20 DIAGNOSIS — E1121 Type 2 diabetes mellitus with diabetic nephropathy: Secondary | ICD-10-CM | POA: Diagnosis not present

## 2019-05-20 DIAGNOSIS — E039 Hypothyroidism, unspecified: Secondary | ICD-10-CM | POA: Diagnosis not present

## 2019-05-20 DIAGNOSIS — Z0000 Encounter for general adult medical examination without abnormal findings: Secondary | ICD-10-CM

## 2019-05-20 DIAGNOSIS — Z23 Encounter for immunization: Secondary | ICD-10-CM | POA: Diagnosis not present

## 2019-05-20 DIAGNOSIS — E538 Deficiency of other specified B group vitamins: Secondary | ICD-10-CM

## 2019-05-20 MED ORDER — LISINOPRIL 10 MG PO TABS: 10.0000 mg | ORAL_TABLET | Freq: Every day | ORAL | 1 refills | Status: DC

## 2019-05-20 MED ORDER — CLOBETASOL PROPIONATE 0.05 % EX OINT: TOPICAL_OINTMENT | Freq: Two times a day (BID) | CUTANEOUS | 3 refills | Status: DC

## 2019-05-20 MED ORDER — ROSUVASTATIN CALCIUM 10 MG PO TABS: 10.0000 mg | ORAL_TABLET | Freq: Every day | ORAL | 1 refills | Status: DC

## 2019-05-20 MED ORDER — CLOPIDOGREL BISULFATE 75 MG PO TABS: 75.0000 mg | ORAL_TABLET | Freq: Every day | ORAL | 3 refills | Status: DC

## 2019-05-20 NOTE — Assessment & Plan Note (Signed)
Rechecking levels today. Will treat as needed. Call with any concerns.  

## 2019-05-20 NOTE — Assessment & Plan Note (Signed)
Under good control with A1c of 6.3- will continue current regimen. Call with any concerns. Continue to monitor.

## 2019-05-20 NOTE — Progress Notes (Signed)
BP (!) 122/54    Pulse (!) 52    Temp 98.4 F (36.9 C) (Oral)    Ht _0  (1.651 m)    Wt 158 lb (71.7 kg)    LMP  (LMP Unknown)    SpO2 100%    BMI 26.29 kg/m    Subjective:    Patient ID: Teresa [name], female    DOB: 21-Aug-1945, 73 y.o.   MRN: [id number]  HPI: Teresa [name] is a 73 y.o. female presenting on 05/20/2019 for comprehensive medical examination. Current medical complaints include:  DIABETES Hypoglycemic episodes:no Polydipsia/polyuria: no Visual disturbance: no Chest pain: no Paresthesias: no Glucose Monitoring: yes  Accucheck frequency: Daily  Fasting glucose: 100s-130s Taking Insulin?: no Blood Pressure Monitoring: not checking Retinal Examination: Not up to Date Foot Exam: Done today Diabetic Education: Completed Pneumovax: Up to Date Influenza: Up to Date Aspirin: no  HYPERTENSION / [place] Satisfied with current treatment? yes Duration of hypertension: chronic BP monitoring frequency: not checking BP medication side effects: no Past BP meds: lisinopril, HCTZ Duration of hyperlipidemia: chronic Cholesterol medication side effects: no Cholesterol supplements: none Past cholesterol medications: crestor Medication compliance: excellent compliance Aspirin: no Recent stressors: no Recurrent headaches: no Visual changes: no Palpitations: no Dyspnea: no Chest pain: no Lower extremity edema: no Dizzy/lightheaded: no  HYPOTHYROIDISM Thyroid control status:stable Satisfied with current treatment? no Medication side effects: no Medication compliance: excellent compliance Recent dose adjustment:no Fatigue: yes Cold intolerance: no Heat intolerance: no Weight gain: no Weight loss: no Constipation: no Diarrhea/loose stools: yes Palpitations: no Lower extremity edema: no Anxiety/depressed mood: yes  She currently lives with: husband Menopausal Symptoms: no  Depression Screen done today and results listed below:  Depression  screen [facility] 2/9 05/20/2019 02/17/2019 02/04/2018 08/06/2017 01/08/2017  Decreased Interest 0 0 1 0 0  Down, Depressed, Hopeless 1 1 0 0 0  PHQ - 2 Score _1 0 0  Altered sleeping 0 - 0 - 0  Tired, decreased energy 1 - 2 - 1  Change in appetite 0 - 0 - 0  Feeling bad or failure about yourself  0 - 0 - 0  Trouble concentrating 1 - 0 - 1  Moving slowly or fidgety/restless 0 - 0 - 0  Suicidal thoughts 0 - 0 - 0  PHQ-9 Score 3 - 3 - 2  Difficult doing work/chores Not difficult at all - Not difficult at all - -    Past Medical History:  Past Medical History:  Diagnosis Date   Diabetes mellitus without complication ([place])    Hyperlipidemia    Hypertension    Lichen sclerosus    Obesity    Osteopenia    Stroke ([place])    Thyroid disease     Surgical History:  Past Surgical History:  Procedure Laterality Date   ROTATOR CUFF REPAIR Left    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      Medications:  Current Outpatient Medications on File Prior to Visit  Medication Sig   blood glucose meter kit and supplies Dispense based on patient and insurance preference. Use up to four times daily as directed. (FOR ICD-9 250.00, 250.01).   ferrous sulfate 325 (65 FE) MG tablet Take 1 tablet (325 mg total) by mouth 2 (two) times daily with a meal.   glucose blood (ONE TOUCH ULTRA TEST) test strip 1 each by Other route as needed for other. Use as instructed   Lancets MISC 1 each by Does not apply route  daily.   levothyroxine (SYNTHROID) 50 MCG tablet Take 1 tablet by mouth once daily   metFORMIN (GLUCOPHAGE-XR) 500 MG 24 hr tablet Take 4 tablets (2,068m total) by mouth daily with breakfast.   No current facility-administered medications on file prior to visit.     Allergies:  Allergies  Allergen Reactions   Atorvastatin     cramps   Evista [Raloxifene] Nausea Only    Social History:  Social History   Socioeconomic History   Marital status: Married    Spouse name: Not on file    Number of children: Not on file   Years of education: Not on file   Highest education level: High school graduate  Occupational History   Occupation: retired  SScientist, product/process developmentstrain: Not hard at all   Food insecurity    Worry: Never true    Inability: Never true   Transportation needs    Medical: No    Non-medical: No  Tobacco Use   Smoking status: Former Smoker    Packs/day: 2.00    Types: Cigarettes    Quit date: 01/1965    Years since quitting: 54.3   Smokeless tobacco: Never Used  Substance and Sexual Activity   Alcohol use: No    Alcohol/week: 0.0 standard drinks   Drug use: No   Sexual activity: Never  Lifestyle   Physical activity    Days per week: 0 days    Minutes per session: 0 min   Stress: Not at all  Relationships   Social connections    Talks on phone: Once a week    Gets together: More than three times a week    Attends religious service: 1 to 4 times per year    Active member of club or organization: No    Attends meetings of clubs or organizations: Never    Relationship status: Married   Intimate partner violence    Fear of current or ex partner: No    Emotionally abused: No    Physically abused: No    Forced sexual activity: No  Other Topics Concern   Not on file  Social History Narrative   Not on file   Social History   Tobacco Use  Smoking Status Former Smoker   Packs/day: 2.00   Types: Cigarettes   Quit date: 01/1965   Years since quitting: 54.3  Smokeless Tobacco Never Used   Social History   Substance and Sexual Activity  Alcohol Use No   Alcohol/week: 0.0 standard drinks    Family History:  Family History  Problem Relation Age of Onset   Hypertension Mother    Thyroid disease Mother    Glaucoma Mother    Coronary artery disease Mother    Stroke Father    Diabetes Brother    Hypertension Maternal Grandfather    Breast cancer Neg Hx     Past medical history, surgical  history, medications, allergies, family history and social history reviewed with patient today and changes made to appropriate areas of the chart.   Review of Systems  Constitutional: Negative.   HENT: Positive for ear pain (R ear). Negative for congestion, ear discharge, hearing loss, nosebleeds, sinus pain, sore throat and tinnitus.   Eyes: Negative.   Respiratory: Negative.  Negative for stridor.   Cardiovascular: Negative.   Gastrointestinal: Positive for diarrhea and vomiting. Negative for abdominal pain, blood in stool, constipation, heartburn, melena and nausea.  Genitourinary: Negative.   Musculoskeletal: Negative.   Skin:  Negative.   Neurological: Negative.   Endo/Heme/Allergies: Negative for environmental allergies and polydipsia. Bruises/bleeds easily.  Psychiatric/Behavioral: Negative for depression, hallucinations, memory loss, substance abuse and suicidal ideas. The patient is nervous/anxious (Husband is in the hospital right now). The patient does not have insomnia.     All other ROS negative except what is listed above and in the HPI.      Objective:    BP (!) 122/54    Pulse (!) 52    Temp 98.4 F (36.9 C) (Oral)    Ht _0  (1.651 m)    Wt 158 lb (71.7 kg)    LMP  (LMP Unknown)    SpO2 100%    BMI 26.29 kg/m   Wt Readings from Last 3 Encounters:  05/20/19 158 lb (71.7 kg)  02/17/19 165 lb 1.6 oz (74.9 kg)  04/08/18 159 lb 8 oz (72.3 kg)    Physical Exam Vitals signs and nursing note reviewed.  Constitutional:      General: She is not in acute distress.    Appearance: Normal appearance. She is not ill-appearing, toxic-appearing or diaphoretic.  HENT:     Head: Normocephalic and atraumatic.     Right Ear: Tympanic membrane, ear canal and external ear normal. There is no impacted cerumen.     Left Ear: Tympanic membrane, ear canal and external ear normal. There is no impacted cerumen.     Nose: Nose normal. No congestion or rhinorrhea.     Mouth/Throat:      Mouth: Mucous membranes are moist.     Pharynx: Oropharynx is clear. No oropharyngeal exudate or posterior oropharyngeal erythema.  Eyes:     General: No scleral icterus.       Right eye: No discharge.        Left eye: No discharge.     Extraocular Movements: Extraocular movements intact.     Conjunctiva/sclera: Conjunctivae normal.     Pupils: Pupils are equal, round, and reactive to light.  Neck:     Musculoskeletal: Normal range of motion and neck supple. No neck rigidity or muscular tenderness.     Vascular: No carotid bruit.  Cardiovascular:     Rate and Rhythm: Normal rate and regular rhythm.     Pulses: Normal pulses.     Heart sounds: No murmur. No friction rub. No gallop.   Pulmonary:     Effort: Pulmonary effort is normal. No respiratory distress.     Breath sounds: Normal breath sounds. No stridor. No wheezing, rhonchi or rales.  Chest:     Chest wall: No tenderness.  Abdominal:     General: Abdomen is flat. Bowel sounds are normal. There is no distension.     Palpations: Abdomen is soft. There is no mass.     Tenderness: There is no abdominal tenderness. There is no right CVA tenderness, left CVA tenderness, guarding or rebound.     Hernia: No hernia is present.  Genitourinary:    Comments: Breast and pelvic exams deferred with shared decision making Musculoskeletal:        General: No swelling, tenderness, deformity or signs of injury.     Right lower leg: No edema.     Left lower leg: No edema.  Lymphadenopathy:     Cervical: No cervical adenopathy.  Skin:    General: Skin is warm and dry.     Capillary Refill: Capillary refill takes less than 2 seconds.     Coloration: Skin is not jaundiced or pale.  Findings: No bruising, erythema, lesion or rash.  Neurological:     General: No focal deficit present.     Mental Status: She is alert and oriented to person, place, and time. Mental status is at baseline.     Cranial Nerves: No cranial nerve deficit.      Sensory: No sensory deficit.     Motor: No weakness.     Coordination: Coordination normal.     Gait: Gait normal.     Deep Tendon Reflexes: Reflexes normal.  Psychiatric:        Mood and Affect: Mood normal.        Behavior: Behavior normal.        Thought Content: Thought content normal.        Judgment: Judgment normal.     Results for orders placed or performed in visit on 05/20/19  Microscopic Examination   BLD  Result Value Ref Range   WBC, UA 6-10 (A) 0 - 5 /hpf   RBC None seen 0 - 2 /hpf   Epithelial Cells (non renal) 0-10 0 - 10 /hpf   Casts Present None seen /lpf   Cast Type Granular casts (A) N/A   Bacteria, UA Few (A) None seen/Few  Urine Culture, Reflex   BLD  Result Value Ref Range   Urine Culture, Routine WILL FOLLOW   Bayer DCA Hb A1c Waived  Result Value Ref Range   HB A1C (BAYER DCA - WAIVED) 6.2 <7.0 %  Microalbumin, Urine Waived  Result Value Ref Range   Microalb, Ur Waived 150 (H) 0 - 19 mg/L   Creatinine, Urine Waived 300 10 - 300 mg/dL   Microalb/Creat Ratio 30-300 (H) <30 mg/g  UA/M w/rflx Culture, Routine   Specimen: Blood   BLD  Result Value Ref Range   Specific Gravity, UA >1.030 (H) 1.005 - 1.030   pH, UA 5.0 5.0 - 7.5   Color, UA Yellow Yellow   Appearance Ur Clear Clear   Leukocytes,UA Trace (A) Negative   Protein,UA 2+ (A) Negative/Trace   Glucose, UA Negative Negative   Ketones, UA Trace (A) Negative   RBC, UA Negative Negative   Bilirubin, UA Negative Negative   Urobilinogen, Ur 0.2 0.2 - 1.0 mg/dL   Nitrite, UA Negative Negative   Microscopic Examination See below:    Urinalysis Reflex Comment       Assessment & Plan:   Problem List Items Addressed This Visit      Endocrine   Hypothyroidism    Rechecking levels today. Will treat as needed. Call with any concerns.       Relevant Orders   CBC with Differential/Platelet   Comprehensive metabolic panel   Lipid Panel w/o Chol/HDL Ratio   TSH   Type 2 diabetes mellitus  with stage 3 chronic kidney disease, without long-term current use of insulin (HCC)    Under good control with A1c of 6.3- will continue current regimen. Call with any concerns. Continue to monitor.       Relevant Medications   rosuvastatin (CRESTOR) 10 MG tablet   lisinopril (ZESTRIL) 10 MG tablet   Other Relevant Orders   Bayer DCA Hb A1c Waived (Completed)   CBC with Differential/Platelet   Comprehensive metabolic panel   Microalbumin, Urine Waived (Completed)     Genitourinary   Hypertensive CKD (chronic kidney disease)    BP running low- will stop HCTZ and recheck 1 month. Call with any concerns.       Relevant Orders  CBC with Differential/Platelet   Comprehensive metabolic panel   Microalbumin, Urine Waived (Completed)   CKD (chronic kidney disease), stage III    Rechecking levels- stopping HCTZ. Await results.       Relevant Orders   CBC with Differential/Platelet   Comprehensive metabolic panel   Microalbumin, Urine Waived (Completed)   UA/M w/rflx Culture, Routine (Completed)     Other   Hyperlipidemia    Under good control on current regimen. Continue current regimen. Continue to monitor. Call with any concerns. Refills given. Labs drawn today.       Relevant Medications   rosuvastatin (CRESTOR) 10 MG tablet   lisinopril (ZESTRIL) 10 MG tablet   Other Relevant Orders   CBC with Differential/Platelet   Comprehensive metabolic panel   X58 deficiency    Rechecking levels today. Await results. Call with any concerns.       Relevant Orders   CBC with Differential/Platelet   Comprehensive metabolic panel   I32 and Folate Panel    Other Visit Diagnoses    Routine general medical examination at a health care facility    -  Primary   Vaccines up to date. Screening labs checked today. Pap N/A. Mammogram, DEXA and colonoscopy up to date. Continue diet and exercise. Call with any concerns.    Flu vaccine need       Flu shot given today.   Relevant Orders    Flu Vaccine QUAD High Dose(Fluad) (Completed)       Follow up plan: Return in about 4 weeks (around 06/17/2019).   LABORATORY TESTING:  - Pap smear: not applicable  IMMUNIZATIONS:   - Tdap: Tetanus vaccination status reviewed: last tetanus booster within 10 years. - Influenza: Administered today - Pneumovax: Up to date - Prevnar: Up to date  SCREENING: -Mammogram: Up to date  - Colonoscopy: Up to date  - Bone Density: Up to date   PATIENT COUNSELING:   Advised to take 1 mg of folate supplement per day if capable of pregnancy.   Sexuality: Discussed sexually transmitted diseases, partner selection, use of condoms, avoidance of unintended pregnancy  and contraceptive alternatives.   Advised to avoid cigarette smoking.  I discussed with the patient that most people either abstain from alcohol or drink within safe limits (<=14/week and <=4 drinks/occasion for males, <=7/weeks and <= 3 drinks/occasion for females) and that the risk for alcohol disorders and other health effects rises proportionally with the number of drinks per week and how often a drinker exceeds daily limits.  Discussed cessation/primary prevention of drug use and availability of treatment for abuse.   Diet: Encouraged to adjust caloric intake to maintain  or achieve ideal body weight, to reduce intake of dietary saturated fat and total fat, to limit sodium intake by avoiding high sodium foods and not adding table salt, and to maintain adequate dietary potassium and calcium preferably from fresh fruits, vegetables, and low-fat dairy products.    stressed the importance of regular exercise  Injury prevention: Discussed safety belts, safety helmets, smoke detector, smoking near bedding or upholstery.   Dental health: Discussed importance of regular tooth brushing, flossing, and dental visits.    NEXT PREVENTATIVE PHYSICAL DUE IN 1 YEAR. Return in about 4 weeks (around 06/17/2019).

## 2019-05-20 NOTE — Assessment & Plan Note (Signed)
Rechecking levels today. Await results. Call with any concerns.  

## 2019-05-20 NOTE — Assessment & Plan Note (Signed)
BP running low- will stop HCTZ and recheck 1 month. Call with any concerns.

## 2019-05-20 NOTE — Assessment & Plan Note (Signed)
Under good control on current regimen. Continue current regimen. Continue to monitor. Call with any concerns. Refills given. Labs drawn today.   

## 2019-05-20 NOTE — Assessment & Plan Note (Signed)
Rechecking levels- stopping HCTZ. Await results.

## 2019-05-21 LAB — CBC WITH DIFFERENTIAL/PLATELET
Basophils Absolute: 0 10*3/uL (ref 0.0–0.2)
Basos: 0 %
EOS (ABSOLUTE): 0 10*3/uL (ref 0.0–0.4)
Eos: 0 %
Hematocrit: 32.3 % — ABNORMAL LOW (ref 34.0–46.6)
Hemoglobin: 10.5 g/dL — ABNORMAL LOW (ref 11.1–15.9)
Immature Grans (Abs): 0 10*3/uL (ref 0.0–0.1)
Immature Granulocytes: 0 %
Lymphocytes Absolute: 2.1 10*3/uL (ref 0.7–3.1)
Lymphs: 30 %
MCH: 30.3 pg (ref 26.6–33.0)
MCHC: 32.5 g/dL (ref 31.5–35.7)
MCV: 93 fL (ref 79–97)
Monocytes Absolute: 0.5 10*3/uL (ref 0.1–0.9)
Monocytes: 7 %
Neutrophils Absolute: 4.2 10*3/uL (ref 1.4–7.0)
Neutrophils: 63 %
Platelets: 268 10*3/uL (ref 150–450)
RBC: 3.46 x10E6/uL — ABNORMAL LOW (ref 3.77–5.28)
RDW: 12.1 % (ref 11.7–15.4)
WBC: 6.8 10*3/uL (ref 3.4–10.8)

## 2019-05-21 LAB — COMPREHENSIVE METABOLIC PANEL
ALT: 11 IU/L (ref 0–32)
AST: 17 IU/L (ref 0–40)
Albumin/Globulin Ratio: 1.7 (ref 1.2–2.2)
Albumin: 4.5 g/dL (ref 3.7–4.7)
Alkaline Phosphatase: 78 IU/L (ref 39–117)
BUN/Creatinine Ratio: 23 (ref 12–28)
BUN: 32 mg/dL — ABNORMAL HIGH (ref 8–27)
Bilirubin Total: 0.3 mg/dL (ref 0.0–1.2)
CO2: 21 mmol/L (ref 20–29)
Calcium: 9.8 mg/dL (ref 8.7–10.3)
Chloride: 104 mmol/L (ref 96–106)
Creatinine, Ser: 1.39 mg/dL — ABNORMAL HIGH (ref 0.57–1.00)
GFR calc Af Amer: 44 mL/min/{1.73_m2} — ABNORMAL LOW (ref >59)
GFR calc non Af Amer: 38 mL/min/{1.73_m2} — ABNORMAL LOW (ref >59)
Globulin, Total: 2.7 g/dL (ref 1.5–4.5)
Glucose: 119 mg/dL — ABNORMAL HIGH (ref 65–99)
Potassium: 4.9 mmol/L (ref 3.5–5.2)
Sodium: 139 mmol/L (ref 134–144)
Total Protein: 7.2 g/dL (ref 6.0–8.5)

## 2019-05-21 LAB — TSH: TSH: 1.59 u[IU]/mL (ref 0.450–4.500)

## 2019-05-21 LAB — LIPID PANEL W/O CHOL/HDL RATIO
Cholesterol, Total: 119 mg/dL (ref 100–199)
HDL: 46 mg/dL (ref >39)
LDL Chol Calc (NIH): 56 mg/dL (ref 0–99)
Triglycerides: 84 mg/dL (ref 0–149)
VLDL Cholesterol Cal: 17 mg/dL (ref 5–40)

## 2019-05-21 LAB — B12 AND FOLATE PANEL
Folate: 12.3 ng/mL (ref >3.0)
Vitamin B-12: 206 pg/mL — ABNORMAL LOW (ref 232–1245)

## 2019-05-22 LAB — MICROALBUMIN, URINE WAIVED
Creatinine, Urine Waived: 300 mg/dL (ref 10–300)
Microalb, Ur Waived: 150 mg/L — ABNORMAL HIGH (ref 0–19)

## 2019-05-22 LAB — UA/M W/RFLX CULTURE, ROUTINE
Bilirubin, UA: NEGATIVE
Glucose, UA: NEGATIVE
Nitrite, UA: NEGATIVE
RBC, UA: NEGATIVE
Specific Gravity, UA: >1.03 — ABNORMAL HIGH (ref 1.005–1.030)
Urobilinogen, Ur: 0.2 mg/dL (ref 0.2–1.0)
pH, UA: 5 (ref 5.0–7.5)

## 2019-05-22 LAB — URINE CULTURE, REFLEX

## 2019-05-22 LAB — MICROSCOPIC EXAMINATION: RBC, Urine: NONE SEEN /hpf (ref 0–2)

## 2019-05-22 LAB — BAYER DCA HB A1C WAIVED: HB A1C (BAYER DCA - WAIVED): 6.2 % (ref <7.0)

## 2019-05-25 ENCOUNTER — Telehealth: Payer: Self-pay | Admitting: Family Medicine

## 2019-05-25 NOTE — Telephone Encounter (Signed)
Please let her know that everything looks stable- her B12 is a bit low- make sure she's taking her B12. She's a little anemic and her kidney function is up just a little bit. Take the B12 and keep drinking fluids and we'll recheck it when she comes back in.

## 2019-05-25 NOTE — Telephone Encounter (Signed)
Called and LVM (signed DPR) letting patient know what Dr. Wynetta Emery said. Asked for patient to please call back with any questions or concerns.

## 2019-05-28 ENCOUNTER — Telehealth: Payer: Self-pay | Admitting: Family Medicine

## 2019-05-28 NOTE — Telephone Encounter (Signed)
Unfortunately per my chart review the appt she is speaking of last week was a CPE and I don't see any mention of her being sick in there. This is a new issue and would require an appt  Copied from [place] [id number]. Topic: General - Inquiry >> May 28, 2019 10:09 AM Rutherford Nail, NT wrote: Reason for CRM: Patient calling and states that she believes that she has a sinus infection. States that she recently seen Dr Wynetta Emery on 05/20/2019. States that she has had a headache most of the week, along with green mucus. Offered appointment, patient states that "I do not need to see her, I just saw her last week." Please advise. [place], [place]

## 2019-05-31 ENCOUNTER — Other Ambulatory Visit: Payer: Self-pay

## 2019-05-31 ENCOUNTER — Ambulatory Visit (INDEPENDENT_AMBULATORY_CARE_PROVIDER_SITE_OTHER): Payer: PPO | Admitting: Family Medicine

## 2019-05-31 ENCOUNTER — Encounter: Payer: Self-pay | Admitting: Family Medicine

## 2019-05-31 VITALS — BP 134/66 | HR 65 | Temp 97.0°F

## 2019-05-31 DIAGNOSIS — J0100 Acute maxillary sinusitis, unspecified: Secondary | ICD-10-CM

## 2019-05-31 MED ORDER — AMOXICILLIN-POT CLAVULANATE 875-125 MG PO TABS: 1.0000 | ORAL_TABLET | Freq: Two times a day (BID) | ORAL | 0 refills | Status: DC

## 2019-05-31 NOTE — Telephone Encounter (Signed)
Appt scheduled

## 2019-05-31 NOTE — Progress Notes (Signed)
BP 134/66   Pulse 65   Temp (!) 97 F (36.1 C)   LMP  (LMP Unknown)    Subjective:    Patient ID: Teresa [name], female    DOB: 30-Jul-1946, 73 y.o.   MRN: [id number]  HPI: Teresa [name] is a 73 y.o. female  Chief Complaint  Patient presents with  . URI    Patient states that her sinus's are bothering her, green nasal mucus, post nasal drip sinus pressure over her eyes. Has been going on about 2 weeks, was better and got worse last week.     . This visit was completed via telephone due to the restrictions of the COVID-19 pandemic. All issues as above were discussed and addressed. Physical exam was done as above through visual confirmation on telephone. If it was felt that the patient should be evaluated in the office, they were directed there. The patient verbally consented to this visit. . Location of the patient: home . Location of the provider: work . Those involved with this call:  . Provider: Merrie Roof, PA-C . CMA: Tiffany Reel, CMA . Front Desk/Registration: Jill Side  . Time spent on call: 15 minutes on the phone discussing health concerns. 5 minutes total spent in review of patient's record and preparation of their chart. I verified patient identity using two factors (patient name and date of birth). Patient consents verbally to being seen via telemedicine visit today.   Several weeks of sinus congestion, pressure, frontal headaches, malaise worsening the past week. Denies fever, chills, CP, SOB, wheezing, N/V/D. Taking cold and flu medications and tylenol with mild relief. Denies sick contacts, recent travels, hx of allergies or pulmonary dz.   Relevant past medical, surgical, family and social history reviewed and updated as indicated. Interim medical history since our last visit reviewed. Allergies and medications reviewed and updated.  Review of Systems  Per HPI unless specifically indicated above     Objective:    BP 134/66   Pulse 65   Temp  (!) 97 F (36.1 C)   LMP  (LMP Unknown)   Wt Readings from Last 3 Encounters:  05/20/19 158 lb (71.7 kg)  02/17/19 165 lb 1.6 oz (74.9 kg)  04/08/18 159 lb 8 oz (72.3 kg)    Physical Exam  Unable to perform PE due to patient lack of access to video technology for today's visit.   Results for orders placed or performed in visit on 05/20/19  Microscopic Examination   BLD  Result Value Ref Range   WBC, UA 6-10 (A) 0 - 5 /hpf   RBC None seen 0 - 2 /hpf   Epithelial Cells (non renal) 0-10 0 - 10 /hpf   Casts Present None seen /lpf   Cast Type Granular casts (A) N/A   Bacteria, UA Few (A) None seen/Few  Urine Culture, Reflex   BLD  Result Value Ref Range   Urine Culture, Routine Final report    Organism ID, Bacteria Comment   Bayer DCA Hb A1c Waived  Result Value Ref Range   HB A1C (BAYER DCA - WAIVED) 6.2 <7.0 %  CBC with Differential/Platelet  Result Value Ref Range   WBC 6.8 3.4 - 10.8 x10E3/uL   RBC 3.46 (L) 3.77 - 5.28 x10E6/uL   Hemoglobin 10.5 (L) 11.1 - 15.9 g/dL   Hematocrit 32.3 (L) 34.0 - 46.6 %   MCV 93 79 - 97 fL   MCH 30.3 26.6 - 33.0 pg   MCHC  32.5 31.5 - 35.7 g/dL   RDW 12.1 11.7 - 15.4 %   Platelets 268 150 - 450 x10E3/uL   Neutrophils 63 Not Estab. %   Lymphs 30 Not Estab. %   Monocytes 7 Not Estab. %   Eos 0 Not Estab. %   Basos 0 Not Estab. %   Neutrophils Absolute 4.2 1.4 - 7.0 x10E3/uL   Lymphocytes Absolute 2.1 0.7 - 3.1 x10E3/uL   Monocytes Absolute 0.5 0.1 - 0.9 x10E3/uL   EOS (ABSOLUTE) 0.0 0.0 - 0.4 x10E3/uL   Basophils Absolute 0.0 0.0 - 0.2 x10E3/uL   Immature Granulocytes 0 Not Estab. %   Immature Grans (Abs) 0.0 0.0 - 0.1 x10E3/uL  Comprehensive metabolic panel  Result Value Ref Range   Glucose 119 (H) 65 - 99 mg/dL   BUN 32 (H) 8 - 27 mg/dL   Creatinine, Ser 1.39 (H) 0.57 - 1.00 mg/dL   GFR calc non Af Amer 38 (L) >59 mL/min/1.73   GFR calc Af Amer 44 (L) >59 mL/min/1.73   BUN/Creatinine Ratio 23 12 - 28   Sodium 139 134 - 144  mmol/L   Potassium 4.9 3.5 - 5.2 mmol/L   Chloride 104 96 - 106 mmol/L   CO2 21 20 - 29 mmol/L   Calcium 9.8 8.7 - 10.3 mg/dL   Total Protein 7.2 6.0 - 8.5 g/dL   Albumin 4.5 3.7 - 4.7 g/dL   Globulin, Total 2.7 1.5 - 4.5 g/dL   Albumin/Globulin Ratio 1.7 1.2 - 2.2   Bilirubin Total 0.3 0.0 - 1.2 mg/dL   Alkaline Phosphatase 78 39 - 117 IU/L   AST 17 0 - 40 IU/L   ALT 11 0 - 32 IU/L  Lipid Panel w/o Chol/HDL Ratio  Result Value Ref Range   Cholesterol, Total 119 100 - 199 mg/dL   Triglycerides 84 0 - 149 mg/dL   HDL 46 >39 mg/dL   VLDL Cholesterol Cal 17 5 - 40 mg/dL   LDL Chol Calc (NIH) 56 0 - 99 mg/dL  Microalbumin, Urine Waived  Result Value Ref Range   Microalb, Ur Waived 150 (H) 0 - 19 mg/L   Creatinine, Urine Waived 300 10 - 300 mg/dL   Microalb/Creat Ratio 30-300 (H) <30 mg/g  TSH  Result Value Ref Range   TSH 1.590 0.450 - 4.500 uIU/mL  UA/M w/rflx Culture, Routine   Specimen: Blood   BLD  Result Value Ref Range   Specific Gravity, UA >1.030 (H) 1.005 - 1.030   pH, UA 5.0 5.0 - 7.5   Color, UA Yellow Yellow   Appearance Ur Clear Clear   Leukocytes,UA Trace (A) Negative   Protein,UA 2+ (A) Negative/Trace   Glucose, UA Negative Negative   Ketones, UA Trace (A) Negative   RBC, UA Negative Negative   Bilirubin, UA Negative Negative   Urobilinogen, Ur 0.2 0.2 - 1.0 mg/dL   Nitrite, UA Negative Negative   Microscopic Examination See below:    Urinalysis Reflex Comment   B12 and Folate Panel  Result Value Ref Range   Vitamin B-12 206 (L) 232 - 1,245 pg/mL   Folate 12.3 >3.0 ng/mL      Assessment & Plan:   Problem List Items Addressed This Visit    None    Visit Diagnoses    Acute non-recurrent maxillary sinusitis    -  Primary   Will tx with augmentin, mucinex, sinus rinses, humidifier. Return precautions reviewed at length   Relevant Medications   amoxicillin-clavulanate (  AUGMENTIN) 875-125 MG tablet       Follow up plan: Return if symptoms worsen  or fail to improve.

## 2019-06-17 ENCOUNTER — Ambulatory Visit (INDEPENDENT_AMBULATORY_CARE_PROVIDER_SITE_OTHER): Payer: PPO | Admitting: Family Medicine

## 2019-06-17 ENCOUNTER — Other Ambulatory Visit: Payer: Self-pay

## 2019-06-17 ENCOUNTER — Encounter: Payer: Self-pay | Admitting: Family Medicine

## 2019-06-17 VITALS — BP 125/73 | HR 51 | Temp 98.2°F

## 2019-06-17 DIAGNOSIS — I129 Hypertensive chronic kidney disease with stage 1 through stage 4 chronic kidney disease, or unspecified chronic kidney disease: Secondary | ICD-10-CM

## 2019-06-17 DIAGNOSIS — N1831 Chronic kidney disease, stage 3a: Secondary | ICD-10-CM

## 2019-06-17 NOTE — Progress Notes (Signed)
BP 125/73   Pulse (!) 51   Temp 98.2 F (36.8 C) (Oral)   LMP  (LMP Unknown)    Subjective:    Patient ID: Teresa [name], female    DOB: [birth year], 73 y.o.   MRN: [id number]  HPI: Teresa [name] is a 73 y.o. female  Chief Complaint  Patient presents with  . Hypertension   HYPERTENSION Hypertension status: better  Satisfied with current treatment? yes Duration of hypertension: chronic BP monitoring frequency:  a few times a week- running in the 130s/50s-60 BP medication side effects:  no Medication compliance: excellent compliance Previous BP meds: HCTZ, lisinopril Aspirin: no Recurrent headaches: no Visual changes: no Palpitations: no Dyspnea: no Chest pain: no Lower extremity edema: no Dizzy/lightheaded: no  Relevant past medical, surgical, family and social history reviewed and updated as indicated. Interim medical history since our last visit reviewed. Allergies and medications reviewed and updated.  Review of Systems  Constitutional: Negative.   Respiratory: Negative.   Cardiovascular: Negative.   Musculoskeletal: Negative.   Skin: Negative.   Neurological: Negative.   Psychiatric/Behavioral: Negative.     Per HPI unless specifically indicated above     Objective:    BP 125/73   Pulse (!) 51   Temp 98.2 F (36.8 C) (Oral)   LMP  (LMP Unknown)   Wt Readings from Last 3 Encounters:  05/20/19 158 lb (71.7 kg)  02/17/19 165 lb 1.6 oz (74.9 kg)  04/08/18 159 lb 8 oz (72.3 kg)    Physical Exam Vitals signs and nursing note reviewed.  Pulmonary:     Effort: Pulmonary effort is normal. No respiratory distress.     Comments: Speaking in full sentences Neurological:     Mental Status: She is alert.  Psychiatric:        Mood and Affect: Mood normal.        Behavior: Behavior normal.        Thought Content: Thought content normal.        Judgment: Judgment normal.     Results for orders placed or performed in visit on 05/20/19   Microscopic Examination   BLD  Result Value Ref Range   WBC, UA 6-10 (A) 0 - 5 /hpf   RBC None seen 0 - 2 /hpf   Epithelial Cells (non renal) 0-10 0 - 10 /hpf   Casts Present None seen /lpf   Cast Type Granular casts (A) N/A   Bacteria, UA Few (A) None seen/Few  Urine Culture, Reflex   BLD  Result Value Ref Range   Urine Culture, Routine Final report    Organism ID, Bacteria Comment   Bayer DCA Hb A1c Waived  Result Value Ref Range   HB A1C (BAYER DCA - WAIVED) 6.2 <7.0 %  CBC with Differential/Platelet  Result Value Ref Range   WBC 6.8 3.4 - 10.8 x10E3/uL   RBC 3.46 (L) 3.77 - 5.28 x10E6/uL   Hemoglobin 10.5 (L) 11.1 - 15.9 g/dL   Hematocrit 32.3 (L) 34.0 - 46.6 %   MCV 93 79 - 97 fL   MCH 30.3 26.6 - 33.0 pg   MCHC 32.5 31.5 - 35.7 g/dL   RDW 12.1 11.7 - 15.4 %   Platelets 268 150 - 450 x10E3/uL   Neutrophils 63 Not Estab. %   Lymphs 30 Not Estab. %   Monocytes 7 Not Estab. %   Eos 0 Not Estab. %   Basos 0 Not Estab. %   Neutrophils Absolute  4.2 1.4 - 7.0 x10E3/uL   Lymphocytes Absolute 2.1 0.7 - 3.1 x10E3/uL   Monocytes Absolute 0.5 0.1 - 0.9 x10E3/uL   EOS (ABSOLUTE) 0.0 0.0 - 0.4 x10E3/uL   Basophils Absolute 0.0 0.0 - 0.2 x10E3/uL   Immature Granulocytes 0 Not Estab. %   Immature Grans (Abs) 0.0 0.0 - 0.1 x10E3/uL  Comprehensive metabolic panel  Result Value Ref Range   Glucose 119 (H) 65 - 99 mg/dL   BUN 32 (H) 8 - 27 mg/dL   Creatinine, Ser 1.39 (H) 0.57 - 1.00 mg/dL   GFR calc non Af Amer 38 (L) >59 mL/min/1.73   GFR calc Af Amer 44 (L) >59 mL/min/1.73   BUN/Creatinine Ratio 23 12 - 28   Sodium 139 134 - 144 mmol/L   Potassium 4.9 3.5 - 5.2 mmol/L   Chloride 104 96 - 106 mmol/L   CO2 21 20 - 29 mmol/L   Calcium 9.8 8.7 - 10.3 mg/dL   Total Protein 7.2 6.0 - 8.5 g/dL   Albumin 4.5 3.7 - 4.7 g/dL   Globulin, Total 2.7 1.5 - 4.5 g/dL   Albumin/Globulin Ratio 1.7 1.2 - 2.2   Bilirubin Total 0.3 0.0 - 1.2 mg/dL   Alkaline Phosphatase 78 39 - 117 IU/L    AST 17 0 - 40 IU/L   ALT 11 0 - 32 IU/L  Lipid Panel w/o Chol/HDL Ratio  Result Value Ref Range   Cholesterol, Total 119 100 - 199 mg/dL   Triglycerides 84 0 - 149 mg/dL   HDL 46 >39 mg/dL   VLDL Cholesterol Cal 17 5 - 40 mg/dL   LDL Chol Calc (NIH) 56 0 - 99 mg/dL  Microalbumin, Urine Waived  Result Value Ref Range   Microalb, Ur Waived 150 (H) 0 - 19 mg/L   Creatinine, Urine Waived 300 10 - 300 mg/dL   Microalb/Creat Ratio 30-300 (H) <30 mg/g  TSH  Result Value Ref Range   TSH 1.590 0.450 - 4.500 uIU/mL  UA/M w/rflx Culture, Routine   Specimen: Blood   BLD  Result Value Ref Range   Specific Gravity, UA >1.030 (H) 1.005 - 1.030   pH, UA 5.0 5.0 - 7.5   Color, UA Yellow Yellow   Appearance Ur Clear Clear   Leukocytes,UA Trace (A) Negative   Protein,UA 2+ (A) Negative/Trace   Glucose, UA Negative Negative   Ketones, UA Trace (A) Negative   RBC, UA Negative Negative   Bilirubin, UA Negative Negative   Urobilinogen, Ur 0.2 0.2 - 1.0 mg/dL   Nitrite, UA Negative Negative   Microscopic Examination See below:    Urinalysis Reflex Comment   B12 and Folate Panel  Result Value Ref Range   Vitamin B-12 206 (L) 232 - 1,245 pg/mL   Folate 12.3 >3.0 ng/mL      Assessment & Plan:   Problem List Items Addressed This Visit      Genitourinary   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 No more dizziness. Continue to monitor. Will check BMP. Call with any concerns.       Relevant Orders   Basic Metabolic Panel (BMET)       Follow up plan: Return in about 6 months (around 12/15/2019).    . This visit was completed via telephone due to the restrictions of the COVID-19 pandemic. All issues as above were discussed and addressed but no physical exam was performed. If it was felt  that the patient should be evaluated in the office, they were directed there. The patient verbally consented to this visit. Patient was unable to complete  an audio/visual visit due to Lack of equipment. Due to the catastrophic nature of the COVID-19 pandemic, this visit was done through audio contact only. . Location of the patient: home . Location of the provider: home . Those involved with this call:  . Provider: Park Liter, DO . CMA: Lesle Chris, [place] . Front Desk/Registration: Don Perking  . Time spent on call: 21 minutes on the phone discussing health concerns. 23 minutes total spent in review of patient's record and preparation of their chart.

## 2019-06-17 NOTE — Assessment & Plan Note (Signed)
Doing well with BP under fair control. No more dizziness. Continue to monitor. Will check BMP. Call with any concerns.

## 2019-07-02 ENCOUNTER — Other Ambulatory Visit: Payer: Self-pay

## 2019-07-06 MED ORDER — METFORMIN HCL ER 500 MG PO TB24: ORAL_TABLET | ORAL | 1 refills | Status: DC

## 2019-07-12 ENCOUNTER — Other Ambulatory Visit: Payer: Self-pay

## 2019-07-12 ENCOUNTER — Other Ambulatory Visit: Payer: PPO

## 2019-07-12 DIAGNOSIS — I129 Hypertensive chronic kidney disease with stage 1 through stage 4 chronic kidney disease, or unspecified chronic kidney disease: Secondary | ICD-10-CM

## 2019-07-12 DIAGNOSIS — N1831 Chronic kidney disease, stage 3a: Secondary | ICD-10-CM

## 2019-07-13 LAB — BASIC METABOLIC PANEL
BUN/Creatinine Ratio: 16 (ref 12–28)
BUN: 17 mg/dL (ref 8–27)
CO2: 22 mmol/L (ref 20–29)
Calcium: 9.5 mg/dL (ref 8.7–10.3)
Chloride: 110 mmol/L — ABNORMAL HIGH (ref 96–106)
Creatinine, Ser: 1.05 mg/dL — ABNORMAL HIGH (ref 0.57–1.00)
GFR calc Af Amer: 61 mL/min/{1.73_m2} (ref >59)
GFR calc non Af Amer: 53 mL/min/{1.73_m2} — ABNORMAL LOW (ref >59)
Glucose: 83 mg/dL (ref 65–99)
Potassium: 5 mmol/L (ref 3.5–5.2)
Sodium: 146 mmol/L — ABNORMAL HIGH (ref 134–144)

## 2019-07-15 ENCOUNTER — Telehealth: Payer: Self-pay | Admitting: Family Medicine

## 2019-07-15 NOTE — Telephone Encounter (Signed)
Patient notified

## 2019-07-15 NOTE — Telephone Encounter (Signed)
Can you please let her know that her kidneys got better. Everything is looking OK. Thanks.

## 2019-07-21 ENCOUNTER — Telehealth: Payer: Self-pay

## 2019-07-21 MED ORDER — ONETOUCH ULTRA BLUE VI STRP: 1.0000 | ORAL_STRIP | 3 refills | Status: DC | PRN

## 2019-07-21 NOTE — Telephone Encounter (Signed)
Routing to provider. Can we clarify directions and change to BID? Please resend to the pharmacy.

## 2019-07-21 NOTE — Telephone Encounter (Signed)
[facility] calling to clarify instructions for the glucose blood (ONE TOUCH ULTRA TEST) test strip  "1 each by Other route as needed for other. Use as instructed "  pharmacist states the instructions do not match up with quantity.  Also the pt had been checking sugar BID last RX.  If this has changed they need new instructions.  cb [id number] Ref  [id number]

## 2019-07-22 NOTE — Telephone Encounter (Signed)
Blase Mess is calling back in to follow up and be advised.    Please assist.

## 2019-07-23 ENCOUNTER — Telehealth: Payer: Self-pay | Admitting: Family Medicine

## 2019-07-23 MED ORDER — GLUCOSE BLOOD VI STRP: 1.0000 | ORAL_STRIP | Freq: Two times a day (BID) | 12 refills | Status: DC

## 2019-07-23 NOTE — Telephone Encounter (Signed)
Called and clarified RX with the pharmacy.  °

## 2019-07-23 NOTE — Telephone Encounter (Signed)
Melissa from the pharmacy calling.  States that RX for glucose blood test strip is still coming in wrong and they need to speak with someone over the phone who can give them authorization to change the script. Please call [phone] ref#: [id number]

## 2019-07-23 NOTE — Addendum Note (Signed)
Addended by: [name] on: 07/23/2019 09:49 AM   Modules accepted: Orders

## 2019-09-03 ENCOUNTER — Other Ambulatory Visit: Payer: Self-pay

## 2019-09-03 MED ORDER — LEVOTHYROXINE SODIUM 50 MCG PO TABS: 50.0000 ug | ORAL_TABLET | Freq: Every day | ORAL | 3 refills | Status: DC

## 2019-09-03 MED ORDER — FERROUS SULFATE 325 (65 FE) MG PO TABS: 325.0000 mg | ORAL_TABLET | Freq: Two times a day (BID) | ORAL | 3 refills | Status: DC

## 2019-09-03 NOTE — Telephone Encounter (Signed)
Refill request for Levothyroxine and Ferrous Sulfate. LOV 06/17/2019 Next Appt: 01/05/2020

## 2019-09-20 ENCOUNTER — Telehealth: Payer: Self-pay | Admitting: Family Medicine

## 2019-09-20 NOTE — Chronic Care Management (AMB) (Signed)
  Chronic Care Management   Note  09/20/2019 Name: TAYLIN MANS MRN: [id number] DOB: 06-Mar-1946  JAVARIA KNAPKE is a 74 y.o. year old female who is a primary care patient of Rubyann, Lingle, DO. I reached out to [name] by phone today in response to a referral sent by Ms. Burnis Medin Ferger's health plan.     Ms. Beissel was given information about Chronic Care Management services today including:  1. CCM service includes personalized support from designated clinical staff supervised by her physician, including individualized plan of care and coordination with other care providers 2. 24/7 contact phone numbers for assistance for urgent and routine care needs. 3. Service will only be billed when office clinical staff spend 20 minutes or more in a month to coordinate care. 4. Only one practitioner may furnish and bill the service in a calendar month. 5. The patient may stop CCM services at any time (effective at the end of the month) by phone call to the office staff. 6. The patient will be responsible for cost sharing (co-pay) of up to 20% of the service fee (after annual deductible is met).  Patient agreed to services and verbal consent obtained.   Follow up plan: Telephone appointment with care management team member scheduled for:10/22/2019  Noreene Larsson, [place] [address], [place], [place] [address] Direct Dial: [phone] [email or web address] Website: [email or web address]

## 2019-09-27 ENCOUNTER — Telehealth: Payer: Self-pay | Admitting: Family Medicine

## 2019-09-27 NOTE — Chronic Care Management (AMB) (Signed)
  Care Management   Note  09/27/2019 Name: RAKISHA HEE MRN: [id number] DOB: February 19, 1946  ELSEE BERGHORST is a 74 y.o. year old female who is a primary care patient of Verle, Pattan, DO and is actively engaged with the care management team. I reached out to [name] by phone today to assist with re-scheduling an initial visit with the RN Case Manager  Follow up plan: Telephone appointment with care management team member scheduled for:11/16/2019  Noreene Larsson, [place], [place], [place] [address] Direct Dial: [phone] [email or web address] Website: [email or web address]

## 2019-09-27 NOTE — Progress Notes (Signed)
Made an error

## 2019-09-27 NOTE — Chronic Care Management (AMB) (Signed)
  Care Management   Note  09/27/2019 Name: Teresa [name] MRN: [id number] DOB: 1945-10-22  Teresa [name] is a 74 y.o. year old female who is a primary care patient of Marjani, [place], DO and is actively engaged with the care management team. I reached out to [name] by phone today to assist with re-scheduling an initial visit with the RN Case Manager  Follow up plan: Unsuccessful telephone outreach attempt made. A HIPPA compliant phone message was left for the patient providing contact information and requesting a return call.  The care management team will reach out to the patient again over the next 7 days.  If patient returns call to provider office, please advise to call [facility]  at [place], [place], [place], [place] [address] Direct Dial: [phone] [email or web address] Website: [email or web address]

## 2019-09-30 ENCOUNTER — Ambulatory Visit: Payer: PPO

## 2019-10-04 ENCOUNTER — Ambulatory Visit: Payer: PPO | Attending: Internal Medicine

## 2019-10-22 ENCOUNTER — Telehealth: Payer: PPO

## 2019-10-29 ENCOUNTER — Other Ambulatory Visit: Payer: Self-pay | Admitting: Family Medicine

## 2019-10-29 NOTE — Telephone Encounter (Signed)
Copied from [place] [id number]. Topic: Quick Communication - Rx Refill/Question >> Oct 29, 2019  9:29 AM Rainey Pines A wrote: Medication: Patient requesting that all medication be sent to her new pharmacy.   Has the patient contacted their pharmacy Yes (Agent: If no, request that the patient contact the pharmacy for the refill.) (Agent: If yes, when and what did the pharmacy advise?)Contact PCP  Preferred Pharmacy (with phone number or street name): [facility], [place] to [facility]  Phone:  [phone] Fax:  [phone]     Agent: Please be advised that RX refills may take up to 3 business days. We ask that you follow-up with your pharmacy.

## 2019-10-29 NOTE — Telephone Encounter (Signed)
Requested medication (s) are due for refill today: Yes  Requested medication (s) are on the active medication list: Yes  Last refill:  Multiple medications  Future visit scheduled: Yes  Notes to clinic:  Multiple requests. Diabetic test kit has expired and some , provider has to refill.    Requested Prescriptions  Pending Prescriptions Disp Refills   blood glucose meter kit and supplies 1 each 0    Sig: Dispense based on patient and insurance preference. Use up to four times daily as directed. (FOR ICD-9 250.00, 250.01).      Endocrinology: Diabetes - Testing Supplies Passed - 10/29/2019  9:46 AM      Passed - Valid encounter within last 12 months    Recent Outpatient Visits           4 months ago 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   [facility] [place], Megan P, DO   5 months ago Acute non-recurrent maxillary sinusitis   [facility] [place], [place]   5 months ago Routine general medical examination at a health care facility   [facility] [place], [place], DO   11 months ago Hypothyroidism, unspecified type   [facility], Megan [facility], DO   1 year ago Anemia, unspecified type   [place], [place], DO       Future Appointments             In 2 months Kliebert, Megan P, DO [place], [facility]              clobetasol ointment (TEMOVATE) 0.05 % 30 g 3    Sig: Apply topically 2 (two) times daily.      Dermatology:  Corticosteroids Passed - 10/29/2019  9:46 AM      Passed - Valid encounter within last 12 months    Recent Outpatient Visits           4 months ago 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   [facility] [place], Megan P, DO   5 months ago Acute non-recurrent maxillary sinusitis   [facility] [place], [place]   5 months ago Routine general medical examination at a  health care facility   [facility], [employer], DO   11 months ago Hypothyroidism, unspecified type   [place], Megan P, DO   1 year ago Anemia, unspecified type   [place], [place], DO       Future Appointments             In 2 months Anselmo, Megan P, DO [place], [facility]              clopidogrel (PLAVIX) 75 MG tablet 90 tablet 3    Sig: Take 1 tablet (75 mg total) by mouth daily.      Hematology: Antiplatelets - clopidogrel Failed - 10/29/2019  9:46 AM      Failed - Evaluate AST, ALT within 2 months of therapy initiation.      Failed - HCT in normal range and within 180 days    Hematocrit  Date Value Ref Range Status  05/20/2019 32.3 (L) 34.0 - 46.6 % Final          Failed - HGB in normal range and within 180 days    Hemoglobin  Date Value Ref Range Status  05/20/2019 10.5 (L)  11.1 - 15.9 g/dL Final          Passed - ALT in normal range and within 360 days    ALT  Date Value Ref Range Status  05/20/2019 11 0 - 32 IU/L Final          Passed - AST in normal range and within 360 days    AST  Date Value Ref Range Status  05/20/2019 17 0 - 40 IU/L Final          Passed - PLT in normal range and within 180 days    Platelets  Date Value Ref Range Status  05/20/2019 268 150 - 450 x10E3/uL Final          Passed - Valid encounter within last 6 months    Recent Outpatient Visits           4 months ago 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   [facility] [place], Megan P, DO   5 months ago Acute non-recurrent maxillary sinusitis   [facility], [place]   5 months ago Routine general medical examination at a health care facility   [facility], [place], DO   11 months ago Hypothyroidism, unspecified type   [place], Megan P, DO   1 year ago Anemia, unspecified type    [facility] [place], [place], DO       Future Appointments             In 2 months Goebel, Megan P, DO [place], [facility]              cyanocobalamin 100 MCG tablet 90 tablet 1    Sig: Take 1 tablet (100 mcg total) by mouth daily.      Off-Protocol Failed - 10/29/2019  9:46 AM      Failed - Medication not assigned to a protocol, review manually.      Passed - Valid encounter within last 12 months    Recent Outpatient Visits           4 months ago 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   [facility] [place], Megan P, DO   5 months ago Acute non-recurrent maxillary sinusitis   [facility] [place], [place]   5 months ago Routine general medical examination at a health care facility   [facility], [place], DO   11 months ago Hypothyroidism, unspecified type   [place], Megan P, DO   1 year ago Anemia, unspecified type   [facility] [place], Megan P, DO       Future Appointments             In 2 months Spreen, Megan P, DO [place], [facility]              ferrous sulfate 325 (65 FE) MG tablet 180 tablet 3    Sig: Take 1 tablet (325 mg total) by mouth 2 (two) times daily with a meal.      Endocrinology:  Minerals - Iron Supplementation Failed - 10/29/2019  9:46 AM      Failed - HGB in normal range and within 360 days    Hemoglobin  Date Value Ref Range Status  05/20/2019 10.5 (L) 11.1 - 15.9 g/dL Final          Failed - HCT in normal  range and within 360 days    Hematocrit  Date Value Ref Range Status  05/20/2019 32.3 (L) 34.0 - 46.6 % Final          Failed - RBC in normal range and within 360 days    RBC  Date Value Ref Range Status  05/20/2019 3.46 (L) 3.77 - 5.28 x10E6/uL Final  09/27/2017 3.53 (L) 3.80 - 5.20 MIL/uL Final          Failed - Fe (serum) in normal range and within 360 days     Iron  Date Value Ref Range Status  04/23/2018 62 27 - 139 ug/dL Final   Iron Saturation  Date Value Ref Range Status  04/23/2018 21 15 - 55 % Final          Failed - Ferritin in normal range and within 360 days    Ferritin  Date Value Ref Range Status  04/23/2018 39 15 - 150 ng/mL Final          Passed - Valid encounter within last 12 months    Recent Outpatient Visits           4 months ago 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   [facility] [place], Megan P, DO   5 months ago Acute non-recurrent maxillary sinusitis   [facility] [place], [place]   5 months ago Routine general medical examination at a health care facility   [facility], [place], DO   11 months ago Hypothyroidism, unspecified type   [facility] [place], Megan P, DO   1 year ago Anemia, unspecified type   [facility] [place], [place], DO       Future Appointments             In 2 months [place], Megan P, DO [place], [facility]              glucose blood test strip 100 each 12    Sig: 1 each by Other route 2 (two) times daily. Use as instructed, Dx: E11.22      Endocrinology: Diabetes - Testing Supplies Passed - 10/29/2019  9:46 AM      Passed - Valid encounter within last 12 months    Recent Outpatient Visits           4 months ago 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   [facility] [place], Megan P, DO   5 months ago Acute non-recurrent maxillary sinusitis   [facility], [place]   5 months ago Routine general medical examination at a health care facility   [facility], [place], DO   11 months ago Hypothyroidism, unspecified type   [place], Megan P, DO   1 year ago Anemia, unspecified type   [place], [place], DO       Future  Appointments             In 2 months [place], Megan P, DO [place], [facility]              Lancets MISC 100 each 12    Sig: 1 each by Does not apply route daily.      Endocrinology: Diabetes - Testing Supplies Passed - 10/29/2019  9:46 AM      Passed - Valid encounter within last 12 months    Recent Outpatient  Visits           4 months ago 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   [facility] [place], [place], DO   5 months ago Acute non-recurrent maxillary sinusitis   [facility] [place], [place]   5 months ago Routine general medical examination at a health care facility   [facility], [place], DO   11 months ago Hypothyroidism, unspecified type   [place], Megan P, DO   1 year ago Anemia, unspecified type   [place], [place], DO       Future Appointments             In 2 months Kimes, Megan P, DO [place], [facility]              levothyroxine (SYNTHROID) 50 MCG tablet 90 tablet 3    Sig: Take 1 tablet (50 mcg total) by mouth daily.      Endocrinology:  Hypothyroid Agents Failed - 10/29/2019  9:46 AM      Failed - TSH needs to be rechecked within 3 months after an abnormal result. Refill until TSH is due.      Passed - TSH in normal range and within 360 days    TSH  Date Value Ref Range Status  05/20/2019 1.590 0.450 - 4.500 uIU/mL Final          Passed - Valid encounter within last 12 months    Recent Outpatient Visits           4 months ago 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   [facility] [place], Megan P, DO   5 months ago Acute non-recurrent maxillary sinusitis   [facility] [place], [place]   5 months ago Routine general medical examination at a health care facility   [facility], [place], DO   11 months ago  Hypothyroidism, unspecified type   [place], Megan P, DO   1 year ago Anemia, unspecified type   [place], [place], DO       Future Appointments             In 2 months Brule, Megan P, DO [place], [facility]              lisinopril (ZESTRIL) 10 MG tablet 90 tablet 1    Sig: Take 1 tablet (10 mg total) by mouth daily.      Cardiovascular:  ACE Inhibitors Failed - 10/29/2019  9:46 AM      Failed - Cr in normal range and within 180 days    Creatinine, Ser  Date Value Ref Range Status  07/12/2019 1.05 (H) 0.57 - 1.00 mg/dL Final          Passed - K in normal range and within 180 days    Potassium  Date Value Ref Range Status  07/12/2019 5.0 3.5 - 5.2 mmol/L Final          Passed - Patient is not pregnant      Passed - Last BP in normal range    BP Readings from Last 1 Encounters:  06/17/19 125/73          Passed - Valid encounter within last 6 months    Recent Outpatient Visits           4 months  ago 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   [facility] [place], [place], DO   5 months ago Acute non-recurrent maxillary sinusitis   [facility] [place] Roof [place], [place]   5 months ago Routine general medical examination at a health care facility   [facility], [place], DO   11 months ago Hypothyroidism, unspecified type   [place], [place], DO   1 year ago Anemia, unspecified type   [facility] [place], Megan P, DO       Future Appointments             In 2 months Fuentes, Megan P, DO [place], [facility]              metFORMIN (GLUCOPHAGE-XR) 500 MG 24 hr tablet 360 tablet 1    Sig: Take 4 tablets (2,'000mg'$  total) by mouth daily with breakfast.      Endocrinology:  Diabetes - Biguanides Failed - 10/29/2019  9:46 AM      Failed - Cr in normal range and within 360 days     Creatinine, Ser  Date Value Ref Range Status  07/12/2019 1.05 (H) 0.57 - 1.00 mg/dL Final          Passed - HBA1C is between 0 and 7.9 and within 180 days    HB A1C (BAYER DCA - WAIVED)  Date Value Ref Range Status  05/20/2019 6.2 <7.0 % Final    Comment:                                          Diabetic Adult            <7.0                                       Healthy Adult        4.3 - 5.7                                                           (DCCT/NGSP) American Diabetes Association's Summary of Glycemic Recommendations for Adults with Diabetes: Hemoglobin A1c <7.0%. More stringent glycemic goals (A1c <6.0%) may further reduce complications at the cost of increased risk of hypoglycemia.           Passed - eGFR in normal range and within 360 days    GFR calc Af Amer  Date Value Ref Range Status  07/12/2019 61 >59 mL/min/1.73 Final   GFR calc non Af Amer  Date Value Ref Range Status  07/12/2019 53 (L) >59 mL/min/1.73 Final          Passed - Valid encounter within last 6 months    Recent Outpatient Visits           4 months ago 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   [facility] [place], Megan P, DO   5 months ago Acute non-recurrent maxillary sinusitis   [place], [place]   5 months ago Routine general medical examination at a health care  facility   [facility], [place], DO   11 months ago Hypothyroidism, unspecified type   [place], [place], DO   1 year ago Anemia, unspecified type   [place], [place], DO       Future Appointments             In 2 months Paget, Megan P, DO [place], [facility]              rosuvastatin (CRESTOR) 10 MG tablet 90 tablet 1    Sig: Take 1 tablet (10 mg total) by mouth daily.      Cardiovascular:  Antilipid - Statins Passed - 10/29/2019  9:46 AM      Passed -  Total Cholesterol in normal range and within 360 days    Cholesterol, Total  Date Value Ref Range Status  05/20/2019 119 100 - 199 mg/dL Final   Cholesterol Piccolo, Waived  Date Value Ref Range Status  07/04/2015 147 <200 mg/dL Final    Comment:                            Desirable                <200                         Borderline High      200- 239                         High                     >239           Passed - LDL in normal range and within 360 days    LDL Chol Calc (NIH)  Date Value Ref Range Status  05/20/2019 56 0 - 99 mg/dL Final          Passed - HDL in normal range and within 360 days    HDL  Date Value Ref Range Status  05/20/2019 46 >39 mg/dL Final          Passed - Triglycerides in normal range and within 360 days    Triglycerides  Date Value Ref Range Status  05/20/2019 84 0 - 149 mg/dL Final   Triglycerides Piccolo,Waived  Date Value Ref Range Status  07/04/2015 89 <150 mg/dL Final    Comment:                            Normal                   <150                         Borderline High     150 - 199                         High                200 - 499                         Very High                >  52           Passed - Patient is not pregnant      Passed - Valid encounter within last 12 months    Recent Outpatient Visits           4 months ago 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   [facility] [place], [place], DO   5 months ago Acute non-recurrent maxillary sinusitis   [facility] [place], [place]   5 months ago Routine general medical examination at a health care facility   [facility], [place], DO   11 months ago Hypothyroidism, unspecified type   [place], [place], DO   1 year ago Anemia, unspecified type   [facility] [place] [name], DO       Future Appointments             In 2  months Wynetta Emery, Barb Merino, DO [employer], [facility]

## 2019-10-29 NOTE — Telephone Encounter (Signed)
Routing to provider  

## 2019-11-01 NOTE — Telephone Encounter (Signed)
Can we check to see if she has enough to make it to appt in May

## 2019-11-01 NOTE — Telephone Encounter (Signed)
Called and left message asking patient if she has enough medication to get to her appt in May.

## 2019-11-02 MED ORDER — LISINOPRIL 10 MG PO TABS: 10.0000 mg | ORAL_TABLET | Freq: Every day | ORAL | 0 refills | Status: DC

## 2019-11-02 MED ORDER — CLOPIDOGREL BISULFATE 75 MG PO TABS: 75.0000 mg | ORAL_TABLET | Freq: Every day | ORAL | 3 refills | Status: DC

## 2019-11-02 MED ORDER — METFORMIN HCL ER 500 MG PO TB24: ORAL_TABLET | ORAL | 0 refills | Status: DC

## 2019-11-02 NOTE — Telephone Encounter (Signed)
Spoke with patient, she will be out of the following Metformin 500mg  Lisinopril 10mg  Plavix

## 2019-11-16 ENCOUNTER — Ambulatory Visit (INDEPENDENT_AMBULATORY_CARE_PROVIDER_SITE_OTHER): Payer: Medicare HMO | Admitting: General Practice

## 2019-11-16 ENCOUNTER — Telehealth: Payer: PPO | Admitting: General Practice

## 2019-11-16 DIAGNOSIS — E1121 Type 2 diabetes mellitus with diabetic nephropathy: Secondary | ICD-10-CM

## 2019-11-16 DIAGNOSIS — E1122 Type 2 diabetes mellitus with diabetic chronic kidney disease: Secondary | ICD-10-CM

## 2019-11-16 DIAGNOSIS — E782 Mixed hyperlipidemia: Secondary | ICD-10-CM

## 2019-11-16 DIAGNOSIS — N1831 Chronic kidney disease, stage 3a: Secondary | ICD-10-CM

## 2019-11-16 DIAGNOSIS — I10 Essential (primary) hypertension: Secondary | ICD-10-CM | POA: Diagnosis not present

## 2019-11-16 NOTE — Chronic Care Management (AMB) (Signed)
Chronic Care Management   Initial Visit Note  11/16/2019 Name: Teresa [name] MRN: [id number] DOB: 04-16-1946  Referred by: [name], DO Reason for referral : Chronic Care Management (Intial: RNCM Chronic Disease Management and Care Coordination Needs)   Teresa [name] is a 74 y.o. year old female who is a primary care patient of Avenell, Sellers, DO. The CCM team was consulted for assistance with chronic disease management and care coordination needs related to HTN, HLD, DMII and CKD Stage 3  Review of patient status, including review of consultants reports, relevant laboratory and other test results, and collaboration with appropriate care team members and the patient's provider was performed as part of comprehensive patient evaluation and provision of chronic care management services.    SDOH (Social Determinants of Health) assessments performed: Yes See Care Plan activities for detailed interventions related to SDOH  SDOH Interventions     Most Recent Value  SDOH Interventions  SDOH Interventions for the Following Domains  Physical Activity, Stress  Physical Activity Interventions  Other (Comments) [does not do structured physical activity but keeps her granddaughter daily]  Stress Interventions  Other (Comment) [knows about LCSW- denies needs at this time.]       Medications: Outpatient Encounter Medications as of 11/16/2019  Medication Sig   blood glucose meter kit and supplies Dispense based on patient and insurance preference. Use up to four times daily as directed. (FOR ICD-9 250.00, 250.01).   clobetasol ointment (TEMOVATE) 0.05 % Apply topically 2 (two) times daily.   clopidogrel (PLAVIX) 75 MG tablet Take 1 tablet (75 mg total) by mouth daily.   cyanocobalamin 100 MCG tablet Take 100 mcg by mouth daily.   ferrous sulfate 325 (65 FE) MG tablet Take 1 tablet (325 mg total) by mouth 2 (two) times daily with a meal.   folic acid (FOLVITE) 845 MCG tablet Take  400 mcg by mouth daily.   glucose blood test strip 1 each by Other route 2 (two) times daily. Use as instructed, Dx: E11.22   Lancets MISC 1 each by Does not apply route daily.   levothyroxine (SYNTHROID) 50 MCG tablet Take 1 tablet (50 mcg total) by mouth daily.   lisinopril (ZESTRIL) 10 MG tablet Take 1 tablet (10 mg total) by mouth daily.   metFORMIN (GLUCOPHAGE-XR) 500 MG 24 hr tablet Take 4 tablets (2,047m total) by mouth daily with breakfast. (Patient taking differently: Take 2 tablets in am and in pm (2,0037mtotal) by mouth daily)   rosuvastatin (CRESTOR) 10 MG tablet Take 1 tablet (10 mg total) by mouth daily.   No facility-administered encounter medications on file as of 11/16/2019.     Objective:   Goals Addressed            This Visit's Progress    RNCM: Pt_ "I am concerned about a letter I got about my medication" (pt-stated)       CARE PLAN ENTRY (see longtitudinal plan of care for additional care plan information)  Current Barriers:   Chronic Disease Management support, education, and care coordination needs related to HTN, HLD, DMII, and CKD Stage 3  Clinical Goal(s) related to HTN, HLD, DMII, and CKD Stage 3 :  Over the next 120 days, patient will:   Work with the care management team to address educational, disease management, and care coordination needs   Begin or continue self health monitoring activities as directed today Measure and record cbg (blood glucose) 1 times daily, Measure and  blood pressure 2 times per week, and adhere to heart healthy/ADA diet °• Call provider office for new or worsened signs and symptoms Blood glucose findings outside established parameters, Blood pressure findings outside established parameters, and New or worsened symptom related to CKD, HLD, and other chronic conditions °• Call care management team with questions or concerns °• Verbalize basic understanding of patient centered plan of care established  today ° °Interventions related to HTN, HLD, DMII, and CKD Stage 3 :  °• Evaluation of current treatment plans and patient's adherence to plan as established by provider °• Assessed patient understanding of disease states °• Assessed patient's education and care coordination needs °• Provided disease specific education to patient: on the benefits of taking blood pressure and recording to bring to the provider visit. Will provide patient with log book at her appointment coming up in May °• Collaborated with appropriate clinical care team members regarding patient needs: Referral to CCM pharmacist concerning a letter the patient received concerning Plavix. She doesn't understand why she is getting the letter since she has been on it so long and would like to discuss it with the pcp and/or pharmacist.  ° °Patient Self Care Activities related to HTN, HLD, DMII, and CKD Stage 3 :  °• Patient is unable to independently self-manage chronic health conditions ° °Initial goal documentation  °  °  °  ° °Ms. Luginbill was given information about Chronic Care Management services today including:  °1. CCM service includes personalized support from designated clinical staff supervised by her physician, including individualized plan of care and coordination with other care providers °2. 24/7 contact phone numbers for assistance for urgent and routine care needs. °3. Service will only be billed when office clinical staff spend 20 minutes or more in a month to coordinate care. °4. Only one practitioner may furnish and bill the service in a calendar month. °5. The patient may stop CCM services at any time (effective at the end of the month) by phone call to the office staff. °6. The patient will be responsible for cost sharing (co-pay) of up to 20% of the service fee (after annual deductible is met). ° °Patient agreed to services and verbal consent obtained.  ° °Plan:  ° °The care management team will reach out to the patient again over  the next 60 days.  ° °Pam Tate RN, MSN, CCM °Community Care Coordinator °[place]   [facility] °[facility] °Mobile: [phone] ° ° ° °

## 2019-11-16 NOTE — Patient Instructions (Signed)
Visit Information  Goals Addressed            This Visit's Progress   . RNCM: Pt_ "I am concerned about a letter I got about my medication" (pt-stated)       [place] (see longtitudinal plan of care for additional care plan information)  Current Barriers:  . Chronic Disease Management support, education, and care coordination needs related to HTN, HLD, DMII, and CKD Stage 3  Clinical Goal(s) related to HTN, HLD, DMII, and CKD Stage 3 :  Over the next 120 days, patient will:  . Work with the care management team to address educational, disease management, and care coordination needs  . Begin or continue self health monitoring activities as directed today Measure and record cbg (blood glucose) 1 times daily, Measure and record blood pressure 2 times per week, and adhere to heart healthy/ADA diet . Call provider office for new or worsened signs and symptoms Blood glucose findings outside established parameters, Blood pressure findings outside established parameters, and New or worsened symptom related to CKD, HLD, and other chronic conditions . Call care management team with questions or concerns . Verbalize basic understanding of patient centered plan of care established today  Interventions related to HTN, HLD, DMII, and CKD Stage 3 :  . Evaluation of current treatment plans and patient's adherence to plan as established by provider . Assessed patient understanding of disease states . Assessed patient's education and care coordination needs . Provided disease specific education to patient: on the benefits of taking blood pressure and recording to bring to the provider visit. Will provide patient with log book at her appointment coming up in May . Collaborated with appropriate clinical care team members regarding patient needs: Referral to CCM pharmacist concerning a letter the patient received concerning Plavix. She doesn't understand why she is getting the letter since she has been  on it so long and would like to discuss it with the pcp and/or pharmacist.   Patient Self Care Activities related to HTN, HLD, DMII, and CKD Stage 3 :  . Patient is unable to independently self-manage chronic health conditions  Initial goal documentation        Ms. Hintze was given information about Chronic Care Management services today including:  1. CCM service includes personalized support from designated clinical staff supervised by her physician, including individualized plan of care and coordination with other care providers 2. 24/7 contact phone numbers for assistance for urgent and routine care needs. 3. Service will only be billed when office clinical staff spend 20 minutes or more in a month to coordinate care. 4. Only one practitioner may furnish and bill the service in a calendar month. 5. The patient may stop CCM services at any time (effective at the end of the month) by phone call to the office staff. 6. The patient will be responsible for cost sharing (co-pay) of up to 20% of the service fee (after annual deductible is met).  Patient agreed to services and verbal consent obtained.   Patient verbalizes understanding of instructions provided today.   The care management team will reach out to the patient again over the next 60 days.   Noreene Larsson RN, MSN, [facility] Mobile: [phone]

## 2019-12-06 ENCOUNTER — Encounter: Payer: Self-pay | Admitting: Family Medicine

## 2019-12-06 ENCOUNTER — Other Ambulatory Visit: Payer: Self-pay

## 2019-12-06 ENCOUNTER — Ambulatory Visit (INDEPENDENT_AMBULATORY_CARE_PROVIDER_SITE_OTHER): Payer: Medicare HMO | Admitting: Family Medicine

## 2019-12-06 ENCOUNTER — Other Ambulatory Visit: Payer: Self-pay | Admitting: Family Medicine

## 2019-12-06 VITALS — BP 176/75 | HR 68 | Temp 98.7°F

## 2019-12-06 DIAGNOSIS — I129 Hypertensive chronic kidney disease with stage 1 through stage 4 chronic kidney disease, or unspecified chronic kidney disease: Secondary | ICD-10-CM | POA: Diagnosis not present

## 2019-12-06 DIAGNOSIS — E1122 Type 2 diabetes mellitus with diabetic chronic kidney disease: Secondary | ICD-10-CM

## 2019-12-06 DIAGNOSIS — E782 Mixed hyperlipidemia: Secondary | ICD-10-CM

## 2019-12-06 DIAGNOSIS — E1121 Type 2 diabetes mellitus with diabetic nephropathy: Secondary | ICD-10-CM | POA: Diagnosis not present

## 2019-12-06 DIAGNOSIS — N1831 Chronic kidney disease, stage 3a: Secondary | ICD-10-CM | POA: Diagnosis not present

## 2019-12-06 DIAGNOSIS — E538 Deficiency of other specified B group vitamins: Secondary | ICD-10-CM

## 2019-12-06 DIAGNOSIS — E039 Hypothyroidism, unspecified: Secondary | ICD-10-CM | POA: Diagnosis not present

## 2019-12-06 LAB — UA/M W/RFLX CULTURE, ROUTINE
Bilirubin, UA: NEGATIVE
Glucose, UA: NEGATIVE
Ketones, UA: NEGATIVE
Leukocytes,UA: NEGATIVE
Nitrite, UA: NEGATIVE
Protein,UA: NEGATIVE
RBC, UA: NEGATIVE
Specific Gravity, UA: 1.02 (ref 1.005–1.030)
Urobilinogen, Ur: 0.2 mg/dL (ref 0.2–1.0)
pH, UA: 5 (ref 5.0–7.5)

## 2019-12-06 LAB — BAYER DCA HB A1C WAIVED: HB A1C (BAYER DCA - WAIVED): 6.5 % (ref <7.0)

## 2019-12-06 MED ORDER — FERROUS SULFATE 325 (65 FE) MG PO TABS: 325.0000 mg | ORAL_TABLET | Freq: Two times a day (BID) | ORAL | 3 refills | Status: DC

## 2019-12-06 MED ORDER — METFORMIN HCL ER 500 MG PO TB24: ORAL_TABLET | ORAL | 1 refills | Status: DC

## 2019-12-06 MED ORDER — LISINOPRIL 20 MG PO TABS: 20.0000 mg | ORAL_TABLET | Freq: Every day | ORAL | 1 refills | Status: DC

## 2019-12-06 MED ORDER — ROSUVASTATIN CALCIUM 10 MG PO TABS: 10.0000 mg | ORAL_TABLET | Freq: Every day | ORAL | 1 refills | Status: DC

## 2019-12-06 NOTE — Assessment & Plan Note (Signed)
Rechecking levels today. Await results. Call with any concerns.  

## 2019-12-06 NOTE — Progress Notes (Signed)
BP (!) 176/75   Pulse 68   Temp 98.7 F (37.1 C)   LMP  (LMP Unknown)   SpO2 97%    Subjective:    Patient ID: Teresa [name], female    DOB: 1945-12-31, 74 y.o.   MRN: [id number]  HPI: Teresa [name] is a 74 y.o. female  Chief Complaint  Patient presents with  . Diabetes  . Hypertension  . Hyperlipidemia   DIABETES Hypoglycemic episodes:no Polydipsia/polyuria: no Visual disturbance: no Chest pain: no Paresthesias: no Glucose Monitoring: no  Accucheck frequency: Not Checking Taking Insulin?: no Blood Pressure Monitoring: not checking Retinal Examination: Not up to Date Foot Exam: Up to Date Diabetic Education: Completed Pneumovax: Up to Date Influenza: Up to Date Aspirin: no  HYPERTENSION / HYPERLIPIDEMIA Satisfied with current treatment? no Duration of hypertension: chronic BP monitoring frequency: not checking BP medication side effects: no Past BP meds: lisinopril Duration of hyperlipidemia: chronic Cholesterol medication side effects: no Cholesterol supplements: none Past cholesterol medications: crestor Medication compliance: excellent compliance Aspirin: no Recent stressors: no Recurrent headaches: yes Visual changes: no Palpitations: no Dyspnea: no Chest pain: no Lower extremity edema: no Dizzy/lightheaded: no  HYPOTHYROIDISM Thyroid control status:controlled Satisfied with current treatment? yes Medication side effects: no Medication compliance: excellent compliance Etiology of hypothyroidism:  Recent dose adjustment:no Fatigue: no Cold intolerance: no Heat intolerance: no Weight gain: no Weight loss: no Constipation: no Diarrhea/loose stools: no Palpitations: no Lower extremity edema: no Anxiety/depressed mood: no  Relevant past medical, surgical, family and social history reviewed and updated as indicated. Interim medical history since our last visit reviewed. Allergies and medications reviewed and updated.  Review of  Systems  Constitutional: Negative.   Respiratory: Negative.   Cardiovascular: Negative.   Musculoskeletal: Negative.   Neurological: Negative.   Psychiatric/Behavioral: Negative.     Per HPI unless specifically indicated above     Objective:    BP (!) 176/75   Pulse 68   Temp 98.7 F (37.1 C)   LMP  (LMP Unknown)   SpO2 97%   Wt Readings from Last 3 Encounters:  05/20/19 158 lb (71.7 kg)  02/17/19 165 lb 1.6 oz (74.9 kg)  04/08/18 159 lb 8 oz (72.3 kg)    Physical Exam Vitals and nursing note reviewed.  Constitutional:      General: She is not in acute distress.    Appearance: Normal appearance. She is not ill-appearing, toxic-appearing or diaphoretic.  HENT:     Head: Normocephalic and atraumatic.     Right Ear: External ear normal.     Left Ear: External ear normal.     Nose: Nose normal.     Mouth/Throat:     Mouth: Mucous membranes are moist.     Pharynx: Oropharynx is clear.  Eyes:     General: No scleral icterus.       Right eye: No discharge.        Left eye: No discharge.     Extraocular Movements: Extraocular movements intact.     Conjunctiva/sclera: Conjunctivae normal.     Pupils: Pupils are equal, round, and reactive to light.  Cardiovascular:     Rate and Rhythm: Normal rate and regular rhythm.     Pulses: Normal pulses.     Heart sounds: Normal heart sounds. No murmur. No friction rub. No gallop.   Pulmonary:     Effort: Pulmonary effort is normal. No respiratory distress.     Breath sounds: Normal breath sounds. No stridor. No  wheezing, rhonchi or rales.  Chest:     Chest wall: No tenderness.  Musculoskeletal:        General: Normal range of motion.     Cervical back: Normal range of motion and neck supple.  Skin:    General: Skin is warm and dry.     Capillary Refill: Capillary refill takes less than 2 seconds.     Coloration: Skin is not jaundiced or pale.     Findings: No bruising, erythema, lesion or rash.  Neurological:     General:  No focal deficit present.     Mental Status: She is alert and oriented to person, place, and time. Mental status is at baseline.  Psychiatric:        Mood and Affect: Mood normal.        Behavior: Behavior normal.        Thought Content: Thought content normal.        Judgment: Judgment normal.     Results for orders placed or performed in visit on [id number]  Basic Metabolic Panel (BMET)  Result Value Ref Range   Glucose 83 65 - 99 mg/dL   BUN 17 8 - 27 mg/dL   Creatinine, Ser 1.05 (H) 0.57 - 1.00 mg/dL   GFR calc non Af Amer 53 (L) >59 mL/min/1.73   GFR calc Af Amer 61 >59 mL/min/1.73   BUN/Creatinine Ratio 16 12 - 28   Sodium 146 (H) 134 - 144 mmol/L   Potassium 5.0 3.5 - 5.2 mmol/L   Chloride 110 (H) 96 - 106 mmol/L   CO2 22 20 - 29 mmol/L   Calcium 9.5 8.7 - 10.3 mg/dL      Assessment & Plan:   Problem List Items Addressed This Visit      Endocrine   Hypothyroidism    Rechecking labs today. Await results. Treat as needed. Call with any concerns.       Relevant Orders   CBC with Differential/Platelet   Comprehensive metabolic panel   TSH   Type 2 diabetes mellitus with stage 3 chronic kidney disease, without long-term current use of insulin ([place]) - Primary    Under good control with a1c of 6.5. Continue current regimen. Continue to monitor. Call with any concerns.       Relevant Medications   rosuvastatin (CRESTOR) 10 MG tablet   metFORMIN (GLUCOPHAGE-XR) 500 MG 24 hr tablet   lisinopril (ZESTRIL) 20 MG tablet   Other Relevant Orders   Bayer DCA Hb A1c Waived   CBC with Differential/Platelet   Comprehensive metabolic panel   UA/M w/rflx Culture, Routine     Genitourinary   Hypertensive CKD (chronic kidney disease)    Not under good control. Will increase her lisinopril to 20mg  and recheck 1 month. Call with any concerns.       Relevant Orders   CBC with Differential/Platelet   Comprehensive metabolic panel   CKD (chronic kidney disease), stage III     Rechecking levels today. Await results. Call with any concerns.       Relevant Orders   CBC with Differential/Platelet   Comprehensive metabolic panel   UA/M w/rflx Culture, Routine     Other   Hyperlipidemia    Under good control on current regimen. Continue current regimen. Continue to monitor. Call with any concerns. Refills given. Labs drawn today.       Relevant Medications   rosuvastatin (CRESTOR) 10 MG tablet   lisinopril (ZESTRIL) 20 MG tablet   Other  Relevant Orders   CBC with Differential/Platelet   Comprehensive metabolic panel   Lipid Panel w/o Chol/HDL Ratio   B12 deficiency    Rechecking levels today. Await results. Call with any concerns.       Relevant Orders   CBC with Differential/Platelet   Comprehensive metabolic panel   [id number]       Follow up plan: Return in about 4 weeks (around 01/03/2020).

## 2019-12-06 NOTE — Assessment & Plan Note (Signed)
Rechecking labs today. Await results. Treat as needed. Call with any concerns.  

## 2019-12-06 NOTE — Assessment & Plan Note (Signed)
Under good control on current regimen. Continue current regimen. Continue to monitor. Call with any concerns. Refills given. Labs drawn today.   

## 2019-12-06 NOTE — Telephone Encounter (Signed)
Requested medication (s) are due for refill today: yes  Requested medication (s) are on the active medication list: yes  Last refill:  09/03/2019  Future visit scheduled: yes  Notes to clinic:  patient has appointment this afternoon   Requested Prescriptions  Pending Prescriptions Disp Refills   ferrous sulfate 325 (65 FE) MG tablet 180 tablet 3    Sig: Take 1 tablet (325 mg total) by mouth 2 (two) times daily with a meal.      Endocrinology:  Minerals - Iron Supplementation Failed - 12/06/2019  9:49 AM      Failed - HGB in normal range and within 360 days    Hemoglobin  Date Value Ref Range Status  05/20/2019 10.5 (L) 11.1 - 15.9 g/dL Final          Failed - HCT in normal range and within 360 days    Hematocrit  Date Value Ref Range Status  05/20/2019 32.3 (L) 34.0 - 46.6 % Final          Failed - RBC in normal range and within 360 days    RBC  Date Value Ref Range Status  05/20/2019 3.46 (L) 3.77 - 5.28 x10E6/uL Final  09/27/2017 3.53 (L) 3.80 - 5.20 MIL/uL Final          Failed - Fe (serum) in normal range and within 360 days    Iron  Date Value Ref Range Status  04/23/2018 62 27 - 139 ug/dL Final   Iron Saturation  Date Value Ref Range Status  04/23/2018 21 15 - 55 % Final          Failed - Ferritin in normal range and within 360 days    Ferritin  Date Value Ref Range Status  04/23/2018 39 15 - 150 ng/mL Final          Passed - Valid encounter within last 12 months    Recent Outpatient Visits           5 months ago 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   [facility] [place], Megan P, DO   6 months ago Acute non-recurrent maxillary sinusitis   [facility] Volney American, PA-C   6 months ago Routine general medical examination at a health care facility   [facility], [place], DO   1 year ago Hypothyroidism, unspecified type   [facility] [place], Megan P,  DO   1 year ago Anemia, unspecified type   [facility] [place], [place], DO       Future Appointments             Today [name], DO [facility], [facility]   In 1 month [place], Megan P, DO [place], [facility]              rosuvastatin (CRESTOR) 10 MG tablet 90 tablet 1    Sig: Take 1 tablet (10 mg total) by mouth daily.      Cardiovascular:  Antilipid - Statins Failed - 12/06/2019  9:49 AM      Failed - LDL in normal range and within 360 days    LDL Chol Calc (NIH)  Date Value Ref Range Status  05/20/2019 56 0 - 99 mg/dL Final          Passed - Total Cholesterol in normal range and within 360 days    Cholesterol, Total  Date Value Ref Range Status  05/20/2019  119 100 - 199 mg/dL Final   Cholesterol [place], [place]  Date Value Ref Range Status  07/04/2015 147 <200 mg/dL Final    Comment:                            Desirable                <200                         Borderline High      200- 239                         High                     >239           Passed - HDL in normal range and within 360 days    HDL  Date Value Ref Range Status  05/20/2019 46 >39 mg/dL Final          Passed - Triglycerides in normal range and within 360 days    Triglycerides  Date Value Ref Range Status  05/20/2019 84 0 - 149 mg/dL Final   Triglycerides [place],Waived  Date Value Ref Range Status  07/04/2015 89 <150 mg/dL Final    Comment:                            Normal                   <150                         Borderline High     150 - 199                         High                200 - 499                         Very High                >499           Passed - Patient is not pregnant      Passed - Valid encounter within last 12 months    Recent Outpatient Visits           5 months ago 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   [facility] [place], [place], DO   6 months ago  Acute non-recurrent maxillary sinusitis   [facility] Volney American, [place]   6 months ago Routine general medical examination at a health care facility   [facility], [place], DO   1 year ago Hypothyroidism, unspecified type   [place], [place], DO   1 year ago Anemia, unspecified type   [place], Barb Merino, DO       Future Appointments             Today [name], DO [facility], [place]   In 1 month [place], Barb Merino, DO [employer], [place]  levothyroxine (SYNTHROID) 50 MCG tablet 90 tablet 3    Sig: Take 1 tablet (50 mcg total) by mouth daily.      Endocrinology:  Hypothyroid Agents Failed - 12/06/2019  9:49 AM      Failed - TSH needs to be rechecked within 3 months after an abnormal result. Refill until TSH is due.      Passed - TSH in normal range and within 360 days    TSH  Date Value Ref Range Status  05/20/2019 1.590 0.450 - 4.500 uIU/mL Final          Passed - Valid encounter within last 12 months    Recent Outpatient Visits           5 months ago 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   [facility] [place], Megan P, DO   6 months ago Acute non-recurrent maxillary sinusitis   [facility] Volney American, [place]   6 months ago Routine general medical examination at a health care facility   [facility], [place], DO   1 year ago Hypothyroidism, unspecified type   [place], [place], DO   1 year ago Anemia, unspecified type   [place], Barb Merino, DO       Future Appointments             Today [name], DO [place], [place]   In 1 month [place], Barb Merino, [place], [place]

## 2019-12-06 NOTE — Assessment & Plan Note (Signed)
Under good control with a1c of 6.5. Continue current regimen. Continue to monitor. Call with any concerns.

## 2019-12-06 NOTE — Telephone Encounter (Signed)
Copied from [place] [id number]. Topic: Quick Communication - Rx Refill/Question >> Dec 06, 2019  9:43 AM Mcneil, Ja-Kwan wrote: Medication: ferrous sulfate 325 (65 FE) MG tablet, levothyroxine (SYNTHROID) 50 MCG tablet, and rosuvastatin (CRESTOR) 10 MG tablet  Has the patient contacted their pharmacy? no  Preferred Pharmacy (with phone number or street name): [facility] [place] [place] to Registered [place] Sites   Phone: [phone]  Fax: [phone]  Agent: Please be advised that RX refills may take up to 3 business days. We ask that you follow-up with your pharmacy.

## 2019-12-06 NOTE — Telephone Encounter (Signed)
Levothyroxine last filled 09/03/2019 for 90 day supply with 3 refills - should not be due.   Rosuvastatin last filled 05/20/2019 for 90 day supply with 1 refill.   Ferrous Sulfate last filled 09/03/2019 for 90 day supply with 3 refills - should not be due.    LOV: 06/17/2019; NOV: 01/05/2019

## 2019-12-06 NOTE — Assessment & Plan Note (Signed)
Not under good control. Will increase her lisinopril to 20mg  and recheck 1 month. Call with any concerns.

## 2019-12-07 ENCOUNTER — Other Ambulatory Visit: Payer: Self-pay | Admitting: Family Medicine

## 2019-12-07 LAB — CBC WITH DIFFERENTIAL/PLATELET
Basophils Absolute: 0 10*3/uL (ref 0.0–0.2)
Basos: 1 %
EOS (ABSOLUTE): 0.1 10*3/uL (ref 0.0–0.4)
Eos: 1 %
Hematocrit: 31.4 % — ABNORMAL LOW (ref 34.0–46.6)
Hemoglobin: 10.2 g/dL — ABNORMAL LOW (ref 11.1–15.9)
Immature Grans (Abs): 0 10*3/uL (ref 0.0–0.1)
Immature Granulocytes: 0 %
Lymphocytes Absolute: 2.4 10*3/uL (ref 0.7–3.1)
Lymphs: 34 %
MCH: 29.4 pg (ref 26.6–33.0)
MCHC: 32.5 g/dL (ref 31.5–35.7)
MCV: 91 fL (ref 79–97)
Monocytes Absolute: 0.6 10*3/uL (ref 0.1–0.9)
Monocytes: 8 %
Neutrophils Absolute: 3.9 10*3/uL (ref 1.4–7.0)
Neutrophils: 56 %
Platelets: 198 10*3/uL (ref 150–450)
RBC: 3.47 x10E6/uL — ABNORMAL LOW (ref 3.77–5.28)
RDW: 13.6 % (ref 11.7–15.4)
WBC: 6.9 10*3/uL (ref 3.4–10.8)

## 2019-12-07 LAB — COMPREHENSIVE METABOLIC PANEL
ALT: 13 IU/L (ref 0–32)
AST: 14 IU/L (ref 0–40)
Albumin/Globulin Ratio: 1.7 (ref 1.2–2.2)
Albumin: 4.2 g/dL (ref 3.7–4.7)
Alkaline Phosphatase: 82 IU/L (ref 39–117)
BUN/Creatinine Ratio: 19 (ref 12–28)
BUN: 21 mg/dL (ref 8–27)
Bilirubin Total: <0.2 mg/dL (ref 0.0–1.2)
CO2: 22 mmol/L (ref 20–29)
Calcium: 9.3 mg/dL (ref 8.7–10.3)
Chloride: 106 mmol/L (ref 96–106)
Creatinine, Ser: 1.09 mg/dL — ABNORMAL HIGH (ref 0.57–1.00)
GFR calc Af Amer: 58 mL/min/{1.73_m2} — ABNORMAL LOW (ref >59)
GFR calc non Af Amer: 50 mL/min/{1.73_m2} — ABNORMAL LOW (ref >59)
Globulin, Total: 2.5 g/dL (ref 1.5–4.5)
Glucose: 198 mg/dL — ABNORMAL HIGH (ref 65–99)
Potassium: 4 mmol/L (ref 3.5–5.2)
Sodium: 143 mmol/L (ref 134–144)
Total Protein: 6.7 g/dL (ref 6.0–8.5)

## 2019-12-07 LAB — LIPID PANEL W/O CHOL/HDL RATIO
Cholesterol, Total: 131 mg/dL (ref 100–199)
HDL: 58 mg/dL (ref >39)
LDL Chol Calc (NIH): 58 mg/dL (ref 0–99)
Triglycerides: 78 mg/dL (ref 0–149)
VLDL Cholesterol Cal: 15 mg/dL (ref 5–40)

## 2019-12-07 LAB — VITAMIN B12: Vitamin B-12: 523 pg/mL (ref 232–1245)

## 2019-12-07 LAB — TSH: TSH: 2.89 u[IU]/mL (ref 0.450–4.500)

## 2019-12-07 NOTE — Telephone Encounter (Signed)
She has been taking crestor for years. OK to dispense. Unable to mark this in the computer. Please have them fill

## 2019-12-07 NOTE — Telephone Encounter (Signed)
Please see message from pharmacy.

## 2019-12-07 NOTE — Telephone Encounter (Signed)
Pharmacy notified.

## 2019-12-07 NOTE — Telephone Encounter (Signed)
Please see below. Fine to dispense. No way to answer question asked by pharmacy via computer.

## 2019-12-14 ENCOUNTER — Other Ambulatory Visit: Payer: Self-pay | Admitting: Family Medicine

## 2019-12-14 MED ORDER — LEVOTHYROXINE SODIUM 50 MCG PO TABS: 50.0000 ug | ORAL_TABLET | Freq: Every day | ORAL | 3 refills | Status: DC

## 2019-12-14 NOTE — Telephone Encounter (Signed)
Requested Prescriptions  Pending Prescriptions Disp Refills  . levothyroxine (SYNTHROID) 50 MCG tablet 90 tablet 3    Sig: Take 1 tablet (50 mcg total) by mouth daily.     Endocrinology:  Hypothyroid Agents Failed - 12/14/2019 11:20 AM      Failed - TSH needs to be rechecked within 3 months after an abnormal result. Refill until TSH is due.      Passed - TSH in normal range and within 360 days    TSH  Date Value Ref Range Status  12/06/2019 2.890 0.450 - 4.500 uIU/mL Final         Passed - Valid encounter within last 12 months    Recent Outpatient Visits          1 week ago Type 2 diabetes mellitus with stage 3a chronic kidney disease, without long-term current use of insulin ([place])   [place], Megan P, DO   6 months ago 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   [facility] [place], [place], DO   6 months ago Acute non-recurrent maxillary sinusitis   [facility] Volney American, [place]   6 months ago Routine general medical examination at a health care facility   [facility], [place], DO   1 year ago Hypothyroidism, unspecified type   [facility] [name], DO      Future Appointments            In 3 weeks Wynetta Emery, Barb Merino, DO [facility], [facility]

## 2019-12-14 NOTE — Telephone Encounter (Signed)
Copied from [place] [id number]. Topic: Quick Communication - Rx Refill/Question >> Dec 14, 2019 11:14 AM Yvette Rack wrote: Medication: levothyroxine (SYNTHROID) 50 MCG tablet  Has the patient contacted their pharmacy? no  Preferred Pharmacy (with phone number or street name): [facility] [place] [place] to Registered [place] Sites Phone: [phone]   Fax: [phone]  Agent: Please be advised that RX refills may take up to 3 business days. We ask that you follow-up with your pharmacy.

## 2020-01-05 ENCOUNTER — Ambulatory Visit: Payer: PPO | Admitting: Family Medicine

## 2020-01-06 ENCOUNTER — Encounter: Payer: Self-pay | Admitting: Family Medicine

## 2020-01-06 ENCOUNTER — Other Ambulatory Visit: Payer: Self-pay

## 2020-01-06 ENCOUNTER — Ambulatory Visit (INDEPENDENT_AMBULATORY_CARE_PROVIDER_SITE_OTHER): Payer: Medicare HMO | Admitting: Family Medicine

## 2020-01-06 DIAGNOSIS — I129 Hypertensive chronic kidney disease with stage 1 through stage 4 chronic kidney disease, or unspecified chronic kidney disease: Secondary | ICD-10-CM

## 2020-01-06 DIAGNOSIS — N1831 Chronic kidney disease, stage 3a: Secondary | ICD-10-CM

## 2020-01-06 MED ORDER — HYDROCHLOROTHIAZIDE 25 MG PO TABS
25.0000 mg | ORAL_TABLET | Freq: Every day | ORAL | 3 refills | Status: DC
Start: 2020-01-06 — End: 2020-02-02

## 2020-01-06 NOTE — Assessment & Plan Note (Signed)
BP still running high and legs swelling slightly. Will start HCTZ and recheck 1 month. Recheck labs at that time too. Call with any concerns.

## 2020-01-06 NOTE — Progress Notes (Signed)
BP (!) 152/74 (BP Location: Left Arm, Cuff Size: Normal)   Pulse 68   Temp 98.6 F (37 C) (Oral)   Ht 5' 0.34" (1.533 m)   Wt 161 lb (73 kg)   LMP  (LMP Unknown)   SpO2 97%   BMI 31.09 kg/m    Subjective:    Patient ID: Teresa [name], female    DOB: 08/02/1946, 74 y.o.   MRN: [id number]  HPI: Teresa [name] is a 74 y.o. female  Chief Complaint  Patient presents with  . Hypertension   HYPERTENSION Hypertension status: better  Satisfied with current treatment? no Duration of hypertension: chronic BP monitoring frequency:  not checking BP medication side effects:  no Medication compliance: excellent compliance Previous BP meds: lisinopril Aspirin: no Recurrent headaches: no Visual changes: no Palpitations: no Dyspnea: no Chest pain: no Lower extremity edema: yes Dizzy/lightheaded: no   Relevant past medical, surgical, family and social history reviewed and updated as indicated. Interim medical history since our last visit reviewed. Allergies and medications reviewed and updated.  Review of Systems  Constitutional: Negative.   Respiratory: Negative.   Cardiovascular: Positive for leg swelling. Negative for chest pain and palpitations.  Gastrointestinal: Negative.   Neurological: Negative.   Psychiatric/Behavioral: Negative.     Per HPI unless specifically indicated above     Objective:    BP (!) 152/74 (BP Location: Left Arm, Cuff Size: Normal)   Pulse 68   Temp 98.6 F (37 C) (Oral)   Ht 5' 0.34" (1.533 m)   Wt 161 lb (73 kg)   LMP  (LMP Unknown)   SpO2 97%   BMI 31.09 kg/m   Wt Readings from Last 3 Encounters:  01/06/20 161 lb (73 kg)  05/20/19 158 lb (71.7 kg)  02/17/19 165 lb 1.6 oz (74.9 kg)    Physical Exam Vitals and nursing note reviewed.  Constitutional:      General: She is not in acute distress.    Appearance: Normal appearance. She is not ill-appearing, toxic-appearing or diaphoretic.  HENT:     Head: Normocephalic and  atraumatic.     Right Ear: External ear normal.     Left Ear: External ear normal.     Nose: Nose normal.     Mouth/Throat:     Mouth: Mucous membranes are moist.     Pharynx: Oropharynx is clear.  Eyes:     General: No scleral icterus.       Right eye: No discharge.        Left eye: No discharge.     Extraocular Movements: Extraocular movements intact.     Conjunctiva/sclera: Conjunctivae normal.     Pupils: Pupils are equal, round, and reactive to light.  Cardiovascular:     Rate and Rhythm: Normal rate and regular rhythm.     Pulses: Normal pulses.     Heart sounds: Normal heart sounds. No murmur. No friction rub. No gallop.   Pulmonary:     Effort: Pulmonary effort is normal. No respiratory distress.     Breath sounds: Normal breath sounds. No stridor. No wheezing, rhonchi or rales.  Chest:     Chest wall: No tenderness.  Musculoskeletal:        General: Normal range of motion.     Cervical back: Normal range of motion and neck supple.     Right lower leg: Edema (trace) present.     Left lower leg: Edema (trace) present.  Skin:    General:  Skin is warm and dry.     Capillary Refill: Capillary refill takes less than 2 seconds.     Coloration: Skin is not jaundiced or pale.     Findings: No bruising, erythema, lesion or rash.  Neurological:     General: No focal deficit present.     Mental Status: She is alert and oriented to person, place, and time. Mental status is at baseline.  Psychiatric:        Mood and Affect: Mood normal.        Behavior: Behavior normal.        Thought Content: Thought content normal.        Judgment: Judgment normal.     Results for orders placed or performed in visit on 12/06/19  Bayer DCA Hb A1c Waived  Result Value Ref Range   HB A1C (BAYER DCA - WAIVED) 6.5 <7.0 %  CBC with Differential/Platelet  Result Value Ref Range   WBC 6.9 3.4 - 10.8 x10E3/uL   RBC 3.47 (L) 3.77 - 5.28 x10E6/uL   Hemoglobin 10.2 (L) 11.1 - 15.9 g/dL    Hematocrit 31.4 (L) 34.0 - 46.6 %   MCV 91 79 - 97 fL   MCH 29.4 26.6 - 33.0 pg   MCHC 32.5 31.5 - 35.7 g/dL   RDW 13.6 11.7 - 15.4 %   Platelets 198 150 - 450 x10E3/uL   Neutrophils 56 Not Estab. %   Lymphs 34 Not Estab. %   Monocytes 8 Not Estab. %   Eos 1 Not Estab. %   Basos 1 Not Estab. %   Neutrophils Absolute 3.9 1.4 - 7.0 x10E3/uL   Lymphocytes Absolute 2.4 0.7 - 3.1 x10E3/uL   Monocytes Absolute 0.6 0.1 - 0.9 x10E3/uL   EOS (ABSOLUTE) 0.1 0.0 - 0.4 x10E3/uL   Basophils Absolute 0.0 0.0 - 0.2 x10E3/uL   Immature Granulocytes 0 Not Estab. %   Immature Grans (Abs) 0.0 0.0 - 0.1 x10E3/uL  Comprehensive metabolic panel  Result Value Ref Range   Glucose 198 (H) 65 - 99 mg/dL   BUN 21 8 - 27 mg/dL   Creatinine, Ser 1.09 (H) 0.57 - 1.00 mg/dL   GFR calc non Af Amer 50 (L) >59 mL/min/1.73   GFR calc Af Amer 58 (L) >59 mL/min/1.73   BUN/Creatinine Ratio 19 12 - 28   Sodium 143 134 - 144 mmol/L   Potassium 4.0 3.5 - 5.2 mmol/L   Chloride 106 96 - 106 mmol/L   CO2 22 20 - 29 mmol/L   Calcium 9.3 8.7 - 10.3 mg/dL   Total Protein 6.7 6.0 - 8.5 g/dL   Albumin 4.2 3.7 - 4.7 g/dL   Globulin, Total 2.5 1.5 - 4.5 g/dL   Albumin/Globulin Ratio 1.7 1.2 - 2.2   Bilirubin Total <0.2 0.0 - 1.2 mg/dL   Alkaline Phosphatase 82 39 - 117 IU/L   AST 14 0 - 40 IU/L   ALT 13 0 - 32 IU/L  Lipid Panel w/o Chol/HDL Ratio  Result Value Ref Range   Cholesterol, Total 131 100 - 199 mg/dL   Triglycerides 78 0 - 149 mg/dL   HDL 58 >39 mg/dL   VLDL Cholesterol Cal 15 5 - 40 mg/dL   LDL Chol Calc (NIH) 58 0 - 99 mg/dL  TSH  Result Value Ref Range   TSH 2.890 0.450 - 4.500 uIU/mL  UA/M w/rflx Culture, Routine   Specimen: Blood   BLD  Result Value Ref Range   Specific  Gravity, UA 1.020 1.005 - 1.030   pH, UA 5.0 5.0 - 7.5   Color, UA Yellow Yellow   Appearance Ur Clear Clear   Leukocytes,UA Negative Negative   Protein,UA Negative Negative/Trace   Glucose, UA Negative Negative   Ketones, UA  Negative Negative   RBC, UA Negative Negative   Bilirubin, UA Negative Negative   Urobilinogen, Ur 0.2 0.2 - 1.0 mg/dL   Nitrite, UA Negative Negative  B12  Result Value Ref Range   Vitamin B-12 523 232 - 1,245 pg/mL      Assessment & Plan:   Problem List Items Addressed This Visit      Genitourinary   Hypertensive CKD (chronic kidney disease)    BP still running high and legs swelling slightly. Will start HCTZ and recheck 1 month. Recheck labs at that time too. Call with any concerns.           Follow up plan: Return in about 4 weeks (around 02/03/2020).

## 2020-01-07 ENCOUNTER — Telehealth: Payer: Self-pay

## 2020-01-07 ENCOUNTER — Ambulatory Visit: Payer: Self-pay | Admitting: General Practice

## 2020-01-07 NOTE — Chronic Care Management (AMB) (Signed)
  Chronic Care Management   Outreach Note  01/07/2020 Name: Teresa [name] MRN: [id number] DOB: [birth year]  Referred by: [name], DO Reason for referral : Chronic Care Management (Follow up: RNCM Chronic Disease Management and Care Coordination Needs)   An unsuccessful telephone outreach was attempted today. The patient was referred to the case management team for assistance with care management and care coordination.   Follow Up Plan: A HIPPA compliant phone message was left for the patient providing contact information and requesting a return call.   Noreene Larsson RN, MSN, [facility] Mobile: [phone]

## 2020-01-20 ENCOUNTER — Other Ambulatory Visit: Payer: Self-pay | Admitting: Family Medicine

## 2020-01-20 NOTE — Telephone Encounter (Signed)
Routing to provider  

## 2020-01-20 NOTE — Telephone Encounter (Signed)
   Notes to clinic Glucophage signature different from request. Zestril dose does not match current med list dose.

## 2020-02-02 ENCOUNTER — Encounter: Payer: Self-pay | Admitting: Family Medicine

## 2020-02-02 ENCOUNTER — Ambulatory Visit (INDEPENDENT_AMBULATORY_CARE_PROVIDER_SITE_OTHER): Payer: Medicare HMO | Admitting: Family Medicine

## 2020-02-02 ENCOUNTER — Other Ambulatory Visit: Payer: Self-pay

## 2020-02-02 VITALS — BP 124/62 | HR 57 | Temp 98.3°F | Ht 61.0 in | Wt 159.0 lb

## 2020-02-02 DIAGNOSIS — I129 Hypertensive chronic kidney disease with stage 1 through stage 4 chronic kidney disease, or unspecified chronic kidney disease: Secondary | ICD-10-CM

## 2020-02-02 DIAGNOSIS — N1831 Chronic kidney disease, stage 3a: Secondary | ICD-10-CM

## 2020-02-02 DIAGNOSIS — Z1211 Encounter for screening for malignant neoplasm of colon: Secondary | ICD-10-CM

## 2020-02-02 MED ORDER — HYDROCHLOROTHIAZIDE 25 MG PO TABS: 25.0000 mg | ORAL_TABLET | Freq: Every day | ORAL | 1 refills | Status: DC

## 2020-02-02 MED ORDER — GLUCOSE BLOOD VI STRP: 1.0000 | ORAL_STRIP | Freq: Two times a day (BID) | 12 refills | Status: DC

## 2020-02-02 MED ORDER — LANCETS MISC: 1.0000 | Freq: Every day | 12 refills | Status: DC

## 2020-02-02 MED ORDER — METFORMIN HCL ER 500 MG PO TB24: ORAL_TABLET | ORAL | 1 refills | Status: DC

## 2020-02-02 NOTE — Progress Notes (Addendum)
BP 124/62 (BP Location: Left Arm, Cuff Size: Normal)   Pulse (!) 57   Temp 98.3 F (36.8 C) (Oral)   Ht 5\' 1"  (1.549 m)   Wt 159 lb (72.1 kg)   LMP  (LMP Unknown)   SpO2 100%   BMI 30.04 kg/m    Subjective:    Patient ID: Teresa [name], female    DOB: 1946-08-08, 74 y.o.   MRN: [id number]  HPI: Teresa [name] is a 74 y.o. female  Chief Complaint  Patient presents with  . Hypertension    Pt states she took bp meds this morning on empty stomach and got sick.   . Chronic Kidney Disease  . Colonoscopy    Records state pt isnt due until 2025, pt states she was called last year during Covid to come back in.    HYPERTENSION Hypertension status: controlled  Satisfied with current treatment? yes Duration of hypertension: chronic BP monitoring frequency:  a few times a day BP range: 140s before meds, 120s with meds BP medication side effects:  no Medication compliance: excellent compliance Previous BP meds: lisinopril, HCTZ Aspirin: no Recurrent headaches: no Visual changes: no Palpitations: no Dyspnea: no Chest pain: no Lower extremity edema: no Dizzy/lightheaded: no  Relevant past medical, surgical, family and social history reviewed and updated as indicated. Interim medical history since our last visit reviewed. Allergies and medications reviewed and updated.  Review of Systems  Constitutional: Negative.   Respiratory: Negative.   Cardiovascular: Negative.   Gastrointestinal: Negative.   Musculoskeletal: Negative.   Psychiatric/Behavioral: Negative.     Per HPI unless specifically indicated above     Objective:    BP 124/62 (BP Location: Left Arm, Cuff Size: Normal)   Pulse (!) 57   Temp 98.3 F (36.8 C) (Oral)   Ht 5\' 1"  (1.549 m)   Wt 159 lb (72.1 kg)   LMP  (LMP Unknown)   SpO2 100%   BMI 30.04 kg/m   Wt Readings from Last 3 Encounters:  02/02/20 159 lb (72.1 kg)  01/06/20 161 lb (73 kg)  05/20/19 158 lb (71.7 kg)    Physical  Exam Vitals and nursing note reviewed.  Constitutional:      General: She is not in acute distress.    Appearance: Normal appearance. She is not ill-appearing, toxic-appearing or diaphoretic.  HENT:     Head: Normocephalic and atraumatic.     Right Ear: External ear normal.     Left Ear: External ear normal.     Nose: Nose normal.     Mouth/Throat:     Mouth: Mucous membranes are moist.     Pharynx: Oropharynx is clear.  Eyes:     General: No scleral icterus.       Right eye: No discharge.        Left eye: No discharge.     Extraocular Movements: Extraocular movements intact.     Conjunctiva/sclera: Conjunctivae normal.     Pupils: Pupils are equal, round, and reactive to light.  Cardiovascular:     Rate and Rhythm: Normal rate and regular rhythm.     Pulses: Normal pulses.     Heart sounds: Normal heart sounds. No murmur heard.  No friction rub. No gallop.   Pulmonary:     Effort: Pulmonary effort is normal. No respiratory distress.     Breath sounds: Normal breath sounds. No stridor. No wheezing, rhonchi or rales.  Chest:     Chest wall: No tenderness.  Musculoskeletal:        General: Normal range of motion.     Cervical back: Normal range of motion and neck supple.  Skin:    General: Skin is warm and dry.     Capillary Refill: Capillary refill takes less than 2 seconds.     Coloration: Skin is not jaundiced or pale.     Findings: No bruising, erythema, lesion or rash.  Neurological:     General: No focal deficit present.     Mental Status: She is alert and oriented to person, place, and time. Mental status is at baseline.  Psychiatric:        Mood and Affect: Mood normal.        Behavior: Behavior normal.        Thought Content: Thought content normal.        Judgment: Judgment normal.     Results for orders placed or performed in visit on 12/06/19  Bayer DCA Hb A1c Waived  Result Value Ref Range   HB A1C (BAYER DCA - WAIVED) 6.5 <7.0 %  CBC with  Differential/Platelet  Result Value Ref Range   WBC 6.9 3.4 - 10.8 x10E3/uL   RBC 3.47 (L) 3.77 - 5.28 x10E6/uL   Hemoglobin 10.2 (L) 11.1 - 15.9 g/dL   Hematocrit 31.4 (L) 34.0 - 46.6 %   MCV 91 79 - 97 fL   MCH 29.4 26.6 - 33.0 pg   MCHC 32.5 31 - 35 g/dL   RDW 13.6 11.7 - 15.4 %   Platelets 198 150 - 450 x10E3/uL   Neutrophils 56 Not Estab. %   Lymphs 34 Not Estab. %   Monocytes 8 Not Estab. %   Eos 1 Not Estab. %   Basos 1 Not Estab. %   Neutrophils Absolute 3.9 1 - 7 x10E3/uL   Lymphocytes Absolute 2.4 0 - 3 x10E3/uL   Monocytes Absolute 0.6 0 - 0 x10E3/uL   EOS (ABSOLUTE) 0.1 0.0 - 0.4 x10E3/uL   Basophils Absolute 0.0 0 - 0 x10E3/uL   Immature Granulocytes 0 Not Estab. %   Immature Grans (Abs) 0.0 0.0 - 0.1 x10E3/uL  Comprehensive metabolic panel  Result Value Ref Range   Glucose 198 (H) 65 - 99 mg/dL   BUN 21 8 - 27 mg/dL   Creatinine, Ser 1.09 (H) 0.57 - 1.00 mg/dL   GFR calc non Af Amer 50 (L) >59 mL/min/1.73   GFR calc Af Amer 58 (L) >59 mL/min/1.73   BUN/Creatinine Ratio 19 12 - 28   Sodium 143 134 - 144 mmol/L   Potassium 4.0 3.5 - 5.2 mmol/L   Chloride 106 96 - 106 mmol/L   CO2 22 20 - 29 mmol/L   Calcium 9.3 8.7 - 10.3 mg/dL   Total Protein 6.7 6.0 - 8.5 g/dL   Albumin 4.2 3.7 - 4.7 g/dL   Globulin, Total 2.5 1.5 - 4.5 g/dL   Albumin/Globulin Ratio 1.7 1.2 - 2.2   Bilirubin Total <0.2 0.0 - 1.2 mg/dL   Alkaline Phosphatase 82 39 - 117 IU/L   AST 14 0 - 40 IU/L   ALT 13 0 - 32 IU/L  Lipid Panel w/o Chol/HDL Ratio  Result Value Ref Range   Cholesterol, Total 131 100 - 199 mg/dL   Triglycerides 78 0 - 149 mg/dL   HDL 58 >39 mg/dL   VLDL Cholesterol Cal 15 5 - 40 mg/dL   LDL Chol Calc (NIH) 58 0 - 99 mg/dL  TSH  Result Value Ref Range   TSH 2.890 0.450 - 4.500 uIU/mL  UA/M w/rflx Culture, Routine   Specimen: Blood   BLD  Result Value Ref Range   Specific Gravity, UA 1.020 1.005 - 1.030   pH, UA 5.0 5.0 - 7.5   Color, UA Yellow Yellow    Appearance Ur Clear Clear   Leukocytes,UA Negative Negative   Protein,UA Negative Negative/Trace   Glucose, UA Negative Negative   Ketones, UA Negative Negative   RBC, UA Negative Negative   Bilirubin, UA Negative Negative   Urobilinogen, Ur 0.2 0.2 - 1.0 mg/dL   Nitrite, UA Negative Negative  B12  Result Value Ref Range   Vitamin B-12 523 232 - 1,245 pg/mL      Assessment & Plan:   Problem List Items Addressed This Visit      Genitourinary   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 Continue current regimen. Continue to monitor. Call with any concerns. Refills given. Labs drawn today.       Relevant Orders   Basic metabolic panel   CKD (chronic kidney disease), stage III    Rechecking labs today. Await results.        Other Visit Diagnoses    Screening for colon cancer       Confirmed with GI- she was due in August 2020 for her repeat colonoscopy. They will be reaching out       Follow up plan: Return October, for wellness/physical.

## 2020-02-02 NOTE — Assessment & Plan Note (Signed)
Under good control on current regimen. Continue current regimen. Continue to monitor. Call with any concerns. Refills given. Labs drawn today.   

## 2020-02-02 NOTE — Assessment & Plan Note (Signed)
Rechecking labs today. Await results.  

## 2020-02-03 ENCOUNTER — Ambulatory Visit (INDEPENDENT_AMBULATORY_CARE_PROVIDER_SITE_OTHER): Payer: Medicare HMO | Admitting: Family Medicine

## 2020-02-03 ENCOUNTER — Encounter: Payer: Self-pay | Admitting: Family Medicine

## 2020-02-03 VITALS — BP 167/68 | HR 51 | Temp 98.2°F | Wt 159.0 lb

## 2020-02-03 DIAGNOSIS — I499 Cardiac arrhythmia, unspecified: Secondary | ICD-10-CM | POA: Diagnosis not present

## 2020-02-03 LAB — BASIC METABOLIC PANEL
BUN/Creatinine Ratio: 25 (ref 12–28)
BUN: 27 mg/dL (ref 8–27)
CO2: 20 mmol/L (ref 20–29)
Calcium: 9.5 mg/dL (ref 8.7–10.3)
Chloride: 110 mmol/L — ABNORMAL HIGH (ref 96–106)
Creatinine, Ser: 1.09 mg/dL — ABNORMAL HIGH (ref 0.57–1.00)
GFR calc Af Amer: 58 mL/min/{1.73_m2} — ABNORMAL LOW (ref >59)
GFR calc non Af Amer: 50 mL/min/{1.73_m2} — ABNORMAL LOW (ref >59)
Glucose: 126 mg/dL — ABNORMAL HIGH (ref 65–99)
Potassium: 4.6 mmol/L (ref 3.5–5.2)
Sodium: 144 mmol/L (ref 134–144)

## 2020-02-04 NOTE — Progress Notes (Signed)
BP (!) 167/68   Pulse (!) 51   Temp 98.2 F (36.8 C) (Oral)   Wt 159 lb (72.1 kg)   LMP  (LMP Unknown)   SpO2 100%   BMI 30.04 kg/m    Subjective:    Subjective   Patient ID: Teresa [name], female    DOB: 1946-05-22, 74 y.o.   MRN: [id number]  HPI: Teresa [name] is a 74 y.o. female      Chief Complaint  Patient presents with  . Irregular heartbeats    per Dr Ahmed Prima, NP at the [facility] today   Here today because at her appt with a specialist earlier this morning the provider noticed an irregular heart rhythm on exam after the blood pressure monitor would not pick up on a reading. States she feels in her usual state of health, no dizzy spells, CP, SOB, sensation of palpitations, hx of rhythm issues. Does feel quite anxious since this morning when this was noticed but otherwise feels well.   Relevant past medical, surgical, family and social history reviewed and updated as indicated. Interim medical history since our last visit reviewed. Allergies and medications reviewed and updated.  Review of Systems  Per HPI unless specifically indicated above     Objective:   Objective    BP (!) 167/68   Pulse (!) 51   Temp 98.2 F (36.8 C) (Oral)   Wt 159 lb (72.1 kg)   LMP  (LMP Unknown)   SpO2 100%   BMI 30.04 kg/m      Wt Readings from Last 3 Encounters:  02/03/20 159 lb (72.1 kg)  02/02/20 159 lb (72.1 kg)  01/06/20 161 lb (73 kg)    Physical Exam Vitals and nursing note reviewed.  Constitutional:      Appearance: Normal appearance. She is not ill-appearing.  HENT:     Head: Atraumatic.  Eyes:     Extraocular Movements: Extraocular movements intact.     Conjunctiva/sclera: Conjunctivae normal.  Cardiovascular:     Rate and Rhythm: Normal rate. Rhythm irregular.  Pulmonary:     Effort: Pulmonary effort is normal.     Breath sounds: Normal breath sounds.  Musculoskeletal:        General: Normal range of motion.     Cervical  back: Normal range of motion and neck supple.  Skin:    General: Skin is warm and dry.  Neurological:     Mental Status: She is alert and oriented to person, place, and time.  Psychiatric:        Mood and Affect: Mood normal.        Thought Content: Thought content normal.        Judgment: Judgment normal.          Results for orders placed or performed in visit on 00/17/49  Basic metabolic panel  Result Value Ref Range   Glucose 126 (H) 65 - 99 mg/dL   BUN 27 8 - 27 mg/dL   Creatinine, Ser 1.09 (H) 0.57 - 1.00 mg/dL   GFR calc non Af Amer 50 (L) >59 mL/min/1.73   GFR calc Af Amer 58 (L) >59 mL/min/1.73   BUN/Creatinine Ratio 25 12 - 28   Sodium 144 134 - 144 mmol/L   Potassium 4.6 3.5 - 5.2 mmol/L   Chloride 110 (H) 96 - 106 mmol/L   CO2 20 20 - 29 mmol/L   Calcium 9.5 8.7 - 10.3 mg/dL      Assessment &  Plan:      Problem List Items Addressed This Visit    None          Visit Diagnoses    Irregular heartbeat    -  Primary   Relevant Orders   EKG 12-Lead (Completed)   Ambulatory referral to Cardiology      EKG today showing NSR, but on auscultation rate is irregular so will refer to Cardiology for further eval and possible event monitor. Discussed at length to watch for any new sxs and f/u immediately if having any.   Follow up plan: With Cardiology

## 2020-02-11 ENCOUNTER — Telehealth: Payer: Self-pay | Admitting: Family Medicine

## 2020-02-11 MED ORDER — LANCETS MISC: 1.0000 | Freq: Every day | 12 refills | Status: DC

## 2020-02-11 MED ORDER — GLUCOSE BLOOD VI STRP: 1.0000 | ORAL_STRIP | Freq: Two times a day (BID) | 12 refills | Status: DC

## 2020-02-11 NOTE — Telephone Encounter (Signed)
Copied from [place] [phone]. Topic: Quick Communication - Rx Refill/Question >> Feb 11, 2020  4:18 PM Leward Quan A wrote: Medication: Lancets MISC, glucose blood test strip   Has the patient contacted their pharmacy? Yes.   (Agent: If no, request that the patient contact the pharmacy for the refill.) (Agent: If yes, when and what did the pharmacy advise?)  Preferred Pharmacy (with phone number or street name): [facility] [place], [place] to Registered [place] Sites  Phone:  [phone] Fax:  [phone]     Agent: Please be advised that RX refills may take up to 3 business days. We ask that you follow-up with your pharmacy.

## 2020-02-15 ENCOUNTER — Ambulatory Visit (INDEPENDENT_AMBULATORY_CARE_PROVIDER_SITE_OTHER): Payer: Medicare HMO | Admitting: General Practice

## 2020-02-15 ENCOUNTER — Telehealth: Payer: Self-pay | Admitting: General Practice

## 2020-02-15 DIAGNOSIS — I499 Cardiac arrhythmia, unspecified: Secondary | ICD-10-CM

## 2020-02-15 DIAGNOSIS — R001 Bradycardia, unspecified: Secondary | ICD-10-CM

## 2020-02-15 DIAGNOSIS — I10 Essential (primary) hypertension: Secondary | ICD-10-CM | POA: Diagnosis not present

## 2020-02-15 DIAGNOSIS — N1831 Chronic kidney disease, stage 3a: Secondary | ICD-10-CM

## 2020-02-15 DIAGNOSIS — E782 Mixed hyperlipidemia: Secondary | ICD-10-CM

## 2020-02-15 DIAGNOSIS — E1121 Type 2 diabetes mellitus with diabetic nephropathy: Secondary | ICD-10-CM | POA: Diagnosis not present

## 2020-02-15 DIAGNOSIS — E1122 Type 2 diabetes mellitus with diabetic chronic kidney disease: Secondary | ICD-10-CM

## 2020-02-15 NOTE — Chronic Care Management (AMB) (Signed)
Chronic Care Management   Follow Up Note   02/15/2020 Name: Teresa [name] MRN: [id number] DOB: 1945/08/30  Referred by: [name], DO Reason for referral : Chronic Care Management (Follow up: RNCM Chronic Disease Management and Care Coordination Needs)   Teresa [name] is a 74 y.o. year old female who is a primary care patient of [place], [place], DO. The CCM team was consulted for assistance with chronic disease management and care coordination needs.    Review of patient status, including review of consultants reports, relevant laboratory and other test results, and collaboration with appropriate care team members and the patient's provider was performed as part of comprehensive patient evaluation and provision of chronic care management services.    SDOH (Social Determinants of Health) assessments performed: Yes See Care Plan activities for detailed interventions related to [facility])     Outpatient Encounter Medications as of 02/15/2020  Medication Sig  . blood glucose meter kit and supplies Dispense based on patient and insurance preference. Use up to four times daily as directed. (FOR ICD-9 250.00, 250.01).  . clobetasol ointment (TEMOVATE) 0.05 % Apply topically 2 (two) times daily.  . clopidogrel (PLAVIX) 75 MG tablet Take 1 tablet (75 mg total) by mouth daily.  . cyanocobalamin 100 MCG tablet Take 100 mcg by mouth daily.  . ferrous sulfate 325 (65 FE) MG tablet Take 1 tablet (325 mg total) by mouth 2 (two) times daily with a meal.  . folic acid (FOLVITE) 154 MCG tablet Take 400 mcg by mouth daily.  Marland Kitchen glucose blood test strip 1 each by Other route 2 (two) times daily. 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 hydrochlorothiazide (HYDRODIURIL) 25 MG tablet Take 1 tablet (25 mg total) by mouth daily.  . Lancets MISC 1 each by Does not apply route daily.  Marland Kitchen levothyroxine (SYNTHROID) 50 MCG tablet Take 1 tablet (50 mcg total) by mouth daily.  Marland Kitchen lisinopril (ZESTRIL) 20 MG tablet Take  1 tablet (20 mg total) by mouth daily.  . metFORMIN (GLUCOPHAGE-XR) 500 MG 24 hr tablet TAKE 4 TABLETS(2,000MG     TOTAL) DAILY WITH BREAKFAST  . rosuvastatin (CRESTOR) 10 MG tablet Take 1 tablet (10 mg total) by mouth daily.   No facility-administered encounter medications on file as of 02/15/2020.     Objective:  BP Readings from Last 3 Encounters:  02/03/20 (!) 167/68  02/02/20 124/62  01/06/20 (!) 152/74   Lab Results  Component Value Date   HGBA1C 6.5 12/06/2019    Goals Addressed              This Visit's Progress   .  RNCM: Pt_ "I am concerned about a letter I got about my medication" (pt-stated)        [place] (see longtitudinal plan of care for additional care plan information)  Current Barriers:  . Chronic Disease Management support, education, and care coordination needs related to HTN, HLD, DMII, and CKD Stage 3  Clinical Goal(s) related to HTN, HLD, DMII, and CKD Stage 3 :  Over the next 120 days, patient will:  . Work with the care management team to address educational, disease management, and care coordination needs  . Begin or continue self health monitoring activities as directed today Measure and record cbg (blood glucose) 1 times daily, Measure and record blood pressure 2 times per week, and adhere to heart healthy/ADA diet . Call provider office for new or worsened signs and symptoms Blood glucose findings outside  established parameters, Blood pressure findings outside established parameters, and New or worsened symptom related to CKD, HLD, and other chronic conditions . Call care management team with questions or concerns . Verbalize basic understanding of patient centered plan of care established today  Interventions related to HTN, HLD, DMII, and CKD Stage 3 :  . Evaluation of current treatment plans and patient's adherence to plan as established by provider.  The patient went to see a specialist last week for her back and they ended up sending  her to the pcp because she had an irregular heart beat. The patient states she felt fine and had no issues. She was evaluated and had a sinus rhythm in the office. She has been referred to cardiology and will see them next week on 02-21-2020.  She said it was a little concerning to her but she feels fine. She says her heart rate normally runs in the 40-50 range.  . Assessed patient understanding of disease states.  The patient has a good understanding of her chronic  conditions. Denies any acute distress. The patient is compliant with the plan of care.  . Assessed patient's education and care coordination needs.  Education on talking to the cardiologist about her concerns and possible test they may want her to have to check her heart.  . Provided disease specific education to patient: on the benefits of taking blood pressure and recording to bring to the provider visit. Will provide patient with log book at her appointment coming up in May.  The patient is taking her blood pressures and recording. States that in the morning it is a little elevated but midday to night it is in the 496'L systolic.  Marland Kitchen Evaluation of dietary intake. The patient is compliant with a heart healthy/ADA diet.  Nash Dimmer with appropriate clinical care team members regarding patient needs: Referral to CCM pharmacist concerning a letter the patient received concerning Plavix. She doesn't understand why she is getting the letter since she has been on it so long and would like to discuss it with the pcp and/or pharmacist.   Patient Self Care Activities related to HTN, HLD, DMII, and CKD Stage 3 :  . Patient is unable to independently self-manage chronic health conditions  Please see past updates related to this goal by clicking on the "Past Updates" button in the selected goal          Plan:   The care management team will reach out to the patient again over the next 60 to 90 days.    Noreene Larsson RN, MSN, [facility] Mobile: [phone]

## 2020-02-15 NOTE — Patient Instructions (Signed)
Visit Information  Goals Addressed              This Visit's Progress   .  RNCM: Pt_ "I am concerned about a letter I got about my medication" (pt-stated)        [place] (see longtitudinal plan of care for additional care plan information)  Current Barriers:  . Chronic Disease Management support, education, and care coordination needs related to HTN, HLD, DMII, and CKD Stage 3  Clinical Goal(s) related to HTN, HLD, DMII, and CKD Stage 3 :  Over the next 120 days, patient will:  . Work with the care management team to address educational, disease management, and care coordination needs  . Begin or continue self health monitoring activities as directed today Measure and record cbg (blood glucose) 1 times daily, Measure and record blood pressure 2 times per week, and adhere to heart healthy/ADA diet . Call provider office for new or worsened signs and symptoms Blood glucose findings outside established parameters, Blood pressure findings outside established parameters, and New or worsened symptom related to CKD, HLD, and other chronic conditions . Call care management team with questions or concerns . Verbalize basic understanding of patient centered plan of care established today  Interventions related to HTN, HLD, DMII, and CKD Stage 3 :  . Evaluation of current treatment plans and patient's adherence to plan as established by provider.  The patient went to see a specialist last week for her back and they ended up sending her to the pcp because she had an irregular heart beat. The patient states she felt fine and had no issues. She was evaluated and had a sinus rhythm in the office. She has been referred to cardiology and will see them next week on 02-21-2020.  She said it was a little concerning to her but she feels fine. She says her heart rate normally runs in the 40-50 range.  . Assessed patient understanding of disease states.  The patient has a good understanding of her chronic   conditions. Denies any acute distress. The patient is compliant with the plan of care.  . Assessed patient's education and care coordination needs.  Education on talking to the cardiologist about her concerns and possible test they may want her to have to check her heart.  . Provided disease specific education to patient: on the benefits of taking blood pressure and recording to bring to the provider visit. Will provide patient with log book at her appointment coming up in May.  The patient is taking her blood pressures and recording. States that in the morning it is a little elevated but midday to night it is in the 267'T systolic.  Marland Kitchen Evaluation of dietary intake. The patient is compliant with a heart healthy/ADA diet.  Nash Dimmer with appropriate clinical care team members regarding patient needs: Referral to CCM pharmacist concerning a letter the patient received concerning Plavix. She doesn't understand why she is getting the letter since she has been on it so long and would like to discuss it with the pcp and/or pharmacist.   Patient Self Care Activities related to HTN, HLD, DMII, and CKD Stage 3 :  . Patient is unable to independently self-manage chronic health conditions  Please see past updates related to this goal by clicking on the "Past Updates" button in the selected goal         Patient verbalizes understanding of instructions provided today.   The care management team will reach out  to the patient again over the next 60 to 90 days.   Noreene Larsson RN, MSN, [facility] Mobile: [phone]

## 2020-02-17 NOTE — Telephone Encounter (Signed)
"  1 each by other 2 (two) times daily". 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 Lilia Pro wants CB at [phone] with Ref# [id number]

## 2020-02-18 NOTE — Telephone Encounter (Signed)
Pharmacy notified that patient should test once daily.

## 2020-02-21 ENCOUNTER — Other Ambulatory Visit: Payer: Self-pay

## 2020-02-21 ENCOUNTER — Ambulatory Visit (INDEPENDENT_AMBULATORY_CARE_PROVIDER_SITE_OTHER): Payer: Medicare HMO

## 2020-02-21 ENCOUNTER — Ambulatory Visit: Payer: Medicare HMO | Admitting: Cardiology

## 2020-02-21 ENCOUNTER — Encounter: Payer: Self-pay | Admitting: Cardiology

## 2020-02-21 VITALS — BP 144/76 | HR 59 | Ht 62.0 in | Wt 160.5 lb

## 2020-02-21 DIAGNOSIS — I10 Essential (primary) hypertension: Secondary | ICD-10-CM

## 2020-02-21 DIAGNOSIS — E7800 Pure hypercholesterolemia, unspecified: Secondary | ICD-10-CM | POA: Diagnosis not present

## 2020-02-21 DIAGNOSIS — R009 Unspecified abnormalities of heart beat: Secondary | ICD-10-CM | POA: Diagnosis not present

## 2020-02-21 NOTE — Patient Instructions (Signed)
Medication Instructions:   Your physician recommends that you continue on your current medications as directed. Please refer to the Current Medication list given to you today.  *If you need a refill on your cardiac medications before your next appointment, please call your pharmacy*   Lab Work: None Ordered If you have labs (blood work) drawn today and your tests are completely normal, you will receive your results only by: Marland Kitchen MyChart Message (if you have MyChart) OR . A paper copy in the mail If you have any lab test that is abnormal or we need to change your treatment, we will call you to review the results.   Testing/Procedures:  Your physician has recommended that you wear a Zio monitor. This monitor is a medical device that records the heart's electrical activity. Doctors most often use these monitors to diagnose arrhythmias. Arrhythmias are problems with the speed or rhythm of the heartbeat. The monitor is a small device applied to your chest. You can wear one while you do your normal daily activities. While wearing this monitor if you have any symptoms to push the button and record what you felt. Once you have worn this monitor for the period of time provider prescribed (Usually 14 days), you will return the monitor device in the postage paid box. Once it is returned they will download the data collected and provide [place] with a report which the provider will then review and we will call you with those results. Important tips:  1. Avoid showering during the first 24 hours of wearing the monitor. 2. Avoid excessive sweating to help maximize wear time. 3. Do not submerge the device, no hot tubs, and no swimming pools. 4. Keep any lotions or oils away from the patch. 5. After 24 hours you may shower with the patch on. Take brief showers with your back facing the shower head.  6. Do not remove patch once it has been placed because that will interrupt data and decrease adhesive wear  time. 7. Push the button when you have any symptoms and write down what you were feeling. 8. Once you have completed wearing your monitor, remove and place into box which has postage paid and place in your outgoing mailbox.  9. If for some reason you have misplaced your box then call our office and we can provide another box and/or mail it off for you.       Follow-Up: At [facility], you and your health needs are our priority.  As part of our continuing mission to provide you with exceptional heart care, we have created designated Provider Care Teams.  These Care Teams include your primary Cardiologist (physician) and Advanced Practice Providers (APPs -  Physician Assistants and Nurse Practitioners) who all work together to provide you with the care you need, when you need it.  We recommend signing up for the patient portal called "MyChart".  Sign up information is provided on this After Visit Summary.  MyChart is used to connect with patients for Virtual Visits (Telemedicine).  Patients are able to view lab/test results, encounter notes, upcoming appointments, etc.  Non-urgent messages can be sent to your provider as well.   To learn more about what you can do with MyChart, go to [email or web address].    Your next appointment:   5 week(s) after Zio results  The format for your next appointment:   In Person  Provider:   Kate Sable, MD   Other Instructions N/A

## 2020-02-21 NOTE — Progress Notes (Signed)
Cardiology Office Note:    Date:  02/21/2020   ID:  ZAIDEE [name], DOB 30-Apr-1946, MRN [id number]  PCP:  [name], DO  [facility] Cardiologist:  Kate Sable, MD  [place] Electrophysiologist:  None   Referring MD: [name], DO   Chief Complaint  Patient presents with  . Other    Irregular heart beat. Meds reviewed verbally with pt.    History of Present Illness:    Teresa [name] is a 74 y.o. female with a hx of hypertension, hyperlipidemia, diabetes who presents due to irregular heartbeat.  Patient was seen around for patient visit where provider noticed an irregular heart rhythm.  There was difficulty obtaining BP readings on machine.  Patient denies dizziness, chest pain, shortness of breath, palpitations.  EKG during the visit showed normal sinus rhythm but on physical exam, heart rate was irregular.  She denies having history of heart disease.  Previous echocardiogram on 04/2018 showed normal systolic function, EF 60 to 65%, pseudonormalization.  Past Medical History:  Diagnosis Date  . Diabetes mellitus without complication ([place])   . Heart murmur   . Hyperlipidemia   . Hypertension   . Lichen sclerosus   . Obesity   . Osteopenia   . Stroke ([place])   . Thyroid disease     Past Surgical History:  Procedure Laterality Date  . ROTATOR CUFF REPAIR Left   . 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 blood glucose meter kit and supplies Dispense based on patient and insurance preference. Use up to four times daily as directed. (FOR ICD-9 250.00, 250.01).  . clobetasol ointment (TEMOVATE) 0.05 % Apply topically 2 (two) times daily.  . clopidogrel (PLAVIX) 75 MG tablet Take 1 tablet (75 mg total) by mouth daily.  . cyanocobalamin 100 MCG tablet Take 100 mcg by mouth daily.  . ferrous sulfate 325 (65 FE) MG tablet Take 1 tablet (325 mg total) by mouth 2 (two) times daily with a meal. (Patient  taking differently: Take 325 mg by mouth daily with breakfast. )  . folic acid (FOLVITE) 563 MCG tablet Take 400 mcg by mouth daily.  Marland Kitchen glucose blood test strip 1 each by Other route 2 (two) times daily. 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 hydrochlorothiazide (HYDRODIURIL) 25 MG tablet Take 1 tablet (25 mg total) by mouth daily.  . Lancets MISC 1 each by Does not apply route daily.  Marland Kitchen levothyroxine (SYNTHROID) 50 MCG tablet Take 1 tablet (50 mcg total) by mouth daily.  Marland Kitchen lisinopril (ZESTRIL) 20 MG tablet Take 1 tablet (20 mg total) by mouth daily.  . metFORMIN (GLUCOPHAGE-XR) 500 MG 24 hr tablet TAKE 4 TABLETS(2,'000MG'$      TOTAL) DAILY WITH BREAKFAST  . rosuvastatin (CRESTOR) 10 MG tablet Take 1 tablet (10 mg total) by mouth daily.     Allergies:   Atorvastatin and Evista [raloxifene]   Social History   Socioeconomic History  . Marital status: Married    Spouse name: Not on file  . Number of children: Not on file  . Years of education: Not on file  . Highest education level: High school graduate  Occupational History  . Occupation: retired  Tobacco Use  . Smoking status: Former Smoker    Packs/day: 2.00    Types: Cigarettes    Quit date: 01/1965    Years since quitting: 55.1  . Smokeless tobacco: Never Used  Vaping  Use  . Vaping Use: Never used  Substance and Sexual Activity  . Alcohol use: No    Alcohol/week: 0.0 standard drinks  . Drug use: No  . Sexual activity: Never  Other Topics Concern  . Not on file  Social History Narrative  . Not on file   Social Determinants of Health   Financial Resource Strain: Low Risk   . Difficulty of Paying Living Expenses: Not hard at all  Food Insecurity: No Food Insecurity  . Worried About Charity fundraiser in the Last Year: Never true  . Ran Out of Food in the Last Year: Never true  Transportation Needs: No Transportation Needs  . Lack of Transportation (Medical): No  . Lack of Transportation (Non-Medical): No  Physical Activity:  Inactive  . Days of Exercise per Week: 0 days  . Minutes of Exercise per Session: 0 min  Stress: Stress Concern Present  . Feeling of Stress : To some extent  Social Connections: Socially Integrated  . Frequency of Communication with Friends and Family: More than three times a week  . Frequency of Social Gatherings with Friends and Family: More than three times a week  . Attends Religious Services: More than 4 times per year  . Active Member of Clubs or Organizations: Yes  . Attends Archivist Meetings: More than 4 times per year  . Marital Status: Married     Family History: The patient's family history includes Coronary artery disease in her mother; Diabetes in her brother; Glaucoma in her mother; Hypertension in her maternal grandfather and mother; Stroke in her father; Thyroid disease in her mother. There is no history of Breast cancer.  ROS:   Please see the history of present illness.     All other systems reviewed and are negative.  EKGs/Labs/Other Studies Reviewed:    The following studies were reviewed today:   EKG:  EKG is  ordered today.  The ekg ordered today demonstrates sinus bradycardia, heart rate 59  Recent Labs: 12/06/2019: ALT 13; Hemoglobin 10.2; Platelets 198; TSH 2.890 02/02/2020: BUN 27; Creatinine, Ser 1.09; Potassium 4.6; Sodium 144  Recent Lipid Panel    Component Value Date/Time   CHOL 131 12/06/2019 1601   CHOL 147 07/04/2015 0820   TRIG 78 12/06/2019 1601   TRIG 89 07/04/2015 0820   HDL 58 12/06/2019 1601   VLDL 18 07/04/2015 0820   LDLCALC 58 12/06/2019 1601    Physical Exam:    VS:  BP (!) 144/76 (BP Location: Right Arm, Patient Position: Sitting, Cuff Size: Normal)   Pulse (!) 59   Ht '5\' 2"'$  (1.575 m)   Wt 160 lb 8 oz (72.8 kg)   LMP  (LMP Unknown)   SpO2 98%   BMI 29.36 kg/m     Wt Readings from Last 3 Encounters:  02/21/20 160 lb 8 oz (72.8 kg)  02/03/20 159 lb (72.1 kg)  02/02/20 159 lb (72.1 kg)     GEN:  Well  nourished, well developed in no acute distress HEENT: Normal NECK: No JVD; No carotid bruits LYMPHATICS: No lymphadenopathy CARDIAC: RRR, no murmurs, rubs, gallops RESPIRATORY:  Clear to auscultation without rales, wheezing or rhonchi  ABDOMEN: Soft, non-tender, non-distended MUSCULOSKELETAL:  No edema; No deformity  SKIN: Warm and dry NEUROLOGIC:  Alert and oriented x 3 PSYCHIATRIC:  Normal affect   ASSESSMENT:    1. Heart beat abnormality   2. Essential hypertension   3. Pure hypercholesterolemia    PLAN:  In order of problems listed above:  1. Abnormal heartbeat noted on outpatient visit.  EKG from PCPs office reviewed by myself showing normal sinus rhythm, heart rate 74.  EKG in the office today shows sinus bradycardia, heart rate 59.  Unsure.  Irregular heartbeat suggests a significant arrhythmia especially in light of patient having a history of CVA.  We will place a cardiac monitor to evaluate any significant arrhythmias such as A. fib or flutter. 2. History of hypertension.  Continue lisinopril, hctz. 3. History of hyperlipidemia, continue Crestor as prescribed.  Follow-up after cardiac monitor.  This note was generated in part or whole with voice recognition software. Voice recognition is usually quite accurate but there are transcription errors that can and very often do occur. I apologize for any typographical errors that were not detected and corrected.  Medication Adjustments/Labs and Tests Ordered: Current medicines are reviewed at length with the patient today.  Concerns regarding medicines are outlined above.  Orders Placed This Encounter  Procedures  . LONG TERM MONITOR (3-14 DAYS)  . EKG 12-Lead   No orders of the defined types were placed in this encounter.   Patient Instructions  Medication Instructions:   Your physician recommends that you continue on your current medications as directed. Please refer to the Current Medication list given to you  today.  *If you need a refill on your cardiac medications before your next appointment, please call your pharmacy*   Lab Work: None Ordered If you have labs (blood work) drawn today and your tests are completely normal, you will receive your results only by: Marland Kitchen MyChart Message (if you have MyChart) OR . A paper copy in the mail If you have any lab test that is abnormal or we need to change your treatment, we will call you to review the results.   Testing/Procedures:  Your physician has recommended that you wear a Zio monitor. This monitor is a medical device that records the heart's electrical activity. Doctors most often use these monitors to diagnose arrhythmias. Arrhythmias are problems with the speed or rhythm of the heartbeat. The monitor is a small device applied to your chest. You can wear one while you do your normal daily activities. While wearing this monitor if you have any symptoms to push the button and record what you felt. Once you have worn this monitor for the period of time provider prescribed (Usually 14 days), you will return the monitor device in the postage paid box. Once it is returned they will download the data collected and provide [place] with a report which the provider will then review and we will call you with those results. Important tips:  1. Avoid showering during the first 24 hours of wearing the monitor. 2. Avoid excessive sweating to help maximize wear time. 3. Do not submerge the device, no hot tubs, and no swimming pools. 4. Keep any lotions or oils away from the patch. 5. After 24 hours you may shower with the patch on. Take brief showers with your back facing the shower head.  6. Do not remove patch once it has been placed because that will interrupt data and decrease adhesive wear time. 7. Push the button when you have any symptoms and write down what you were feeling. 8. Once you have completed wearing your monitor, remove and place into box which has  postage paid and place in your outgoing mailbox.  9. If for some reason you have misplaced your box then call our office and  we can provide another box and/or mail it off for you.       Follow-Up: At [facility], you and your health needs are our priority.  As part of our continuing mission to provide you with exceptional heart care, we have created designated Provider Care Teams.  These Care Teams include your primary Cardiologist (physician) and Advanced Practice Providers (APPs -  Physician Assistants and Nurse Practitioners) who all work together to provide you with the care you need, when you need it.  We recommend signing up for the patient portal called "MyChart".  Sign up information is provided on this After Visit Summary.  MyChart is used to connect with patients for Virtual Visits (Telemedicine).  Patients are able to view lab/test results, encounter notes, upcoming appointments, etc.  Non-urgent messages can be sent to your provider as well.   To learn more about what you can do with MyChart, go to [email or web address].    Your next appointment:   5 week(s) after Zio results  The format for your next appointment:   In Person  Provider:   Kate Sable, MD   Other Instructions N/A     Signed, Kate Sable, MD  02/21/2020 5:48 PM    [place]

## 2020-03-10 ENCOUNTER — Ambulatory Visit (INDEPENDENT_AMBULATORY_CARE_PROVIDER_SITE_OTHER): Payer: Medicare HMO

## 2020-03-10 VITALS — Ht 62.0 in | Wt 160.0 lb

## 2020-03-10 DIAGNOSIS — Z0000 Encounter for general adult medical examination without abnormal findings: Secondary | ICD-10-CM

## 2020-03-10 NOTE — Patient Instructions (Signed)
Ms. Difrancesco , Thank you for taking time to come for your Medicare Wellness Visit. I appreciate your ongoing commitment to your health goals. Please review the following plan we discussed and let me know if I can assist you in the future.   Screening recommendations/referrals: Colonoscopy: Due Mammogram: completed 03/31/2019 Bone Density: completed 01/22/2016 Recommended yearly ophthalmology/optometry visit for glaucoma screening and checkup Recommended yearly dental visit for hygiene and checkup  Vaccinations: Influenza vaccine: completed 05/20/2019 Pneumococcal vaccine: completed 10/14/2014 Tdap vaccine: completed 10/05/2010 Shingles vaccine: discussed   Covid-19:11/03/2019, 10/06/2019  Advanced directives: Advance directive discussed with you today.    Conditions/risks identified: none  Next appointment: Follow up in one year for your annual wellness visit    Preventive Care 65 Years and Older, Female Preventive care refers to lifestyle choices and visits with your health care provider that can promote health and wellness. What does preventive care include?  A yearly physical exam. This is also called an annual well check.  Dental exams once or twice a year.  Routine eye exams. Ask your health care provider how often you should have your eyes checked.  Personal lifestyle choices, including:  Daily care of your teeth and gums.  Regular physical activity.  Eating a healthy diet.  Avoiding tobacco and drug use.  Limiting alcohol use.  Practicing safe sex.  Taking low-dose aspirin every day.  Taking vitamin and mineral supplements as recommended by your health care provider. What happens during an annual well check? The services and screenings done by your health care provider during your annual well check will depend on your age, overall health, lifestyle risk factors, and family history of disease. Counseling  Your health care provider may ask you questions about  your:  Alcohol use.  Tobacco use.  Drug use.  Emotional well-being.  Home and relationship well-being.  Sexual activity.  Eating habits.  History of falls.  Memory and ability to understand (cognition).  Work and work Statistician.  Reproductive health. Screening  You may have the following tests or measurements:  Height, weight, and BMI.  Blood pressure.  Lipid and cholesterol levels. These may be checked every 5 years, or more frequently if you are over 34 years old.  Skin check.  Lung cancer screening. You may have this screening every year starting at age 53 if you have a 30-pack-year history of smoking and currently smoke or have quit within the past 15 years.  Fecal occult blood test (FOBT) of the stool. You may have this test every year starting at age 12.  Flexible sigmoidoscopy or colonoscopy. You may have a sigmoidoscopy every 5 years or a colonoscopy every 10 years starting at age 69.  Hepatitis C blood test.  Hepatitis B blood test.  Sexually transmitted disease (STD) testing.  Diabetes screening. This is done by checking your blood sugar (glucose) after you have not eaten for a while (fasting). You may have this done every 1-3 years.  Bone density scan. This is done to screen for osteoporosis. You may have this done starting at age 46.  Mammogram. This may be done every 1-2 years. Talk to your health care provider about how often you should have regular mammograms. Talk with your health care provider about your test results, treatment options, and if necessary, the need for more tests. Vaccines  Your health care provider may recommend certain vaccines, such as:  Influenza vaccine. This is recommended every year.  Tetanus, diphtheria, and acellular pertussis (Tdap, Td) vaccine. You may  need a Td booster every 10 years.  Zoster vaccine. You may need this after age 78.  Pneumococcal 13-valent conjugate (PCV13) vaccine. One dose is recommended  after age 43.  Pneumococcal polysaccharide (PPSV23) vaccine. One dose is recommended after age 57. Talk to your health care provider about which screenings and vaccines you need and how often you need them. This information is not intended to replace advice given to you by your health care provider. Make sure you discuss any questions you have with your health care provider. Document Released: 08/25/2015 Document Revised: 04/17/2016 Document Reviewed: 05/30/2015 Elsevier Interactive Patient Education  2017 [employer] Prevention in the Home Falls can cause injuries. They can happen to people of all ages. There are many things you can do to make your home safe and to help prevent falls. What can I do on the outside of my home?  Regularly fix the edges of walkways and driveways and fix any cracks.  Remove anything that might make you trip as you walk through a door, such as a raised step or threshold.  Trim any bushes or trees on the path to your home.  Use bright outdoor lighting.  Clear any walking paths of anything that might make someone trip, such as rocks or tools.  Regularly check to see if handrails are loose or broken. Make sure that both sides of any steps have handrails.  Any raised decks and porches should have guardrails on the edges.  Have any leaves, snow, or ice cleared regularly.  Use sand or salt on walking paths during winter.  Clean up any spills in your garage right away. This includes oil or grease spills. What can I do in the bathroom?  Use night lights.  Install grab bars by the toilet and in the tub and shower. Do not use towel bars as grab bars.  Use non-skid mats or decals in the tub or shower.  If you need to sit down in the shower, use a plastic, non-slip stool.  Keep the floor dry. Clean up any water that spills on the floor as soon as it happens.  Remove soap buildup in the tub or shower regularly.  Attach bath mats securely with  double-sided non-slip rug tape.  Do not have throw rugs and other things on the floor that can make you trip. What can I do in the bedroom?  Use night lights.  Make sure that you have a light by your bed that is easy to reach.  Do not use any sheets or blankets that are too big for your bed. They should not hang down onto the floor.  Have a firm chair that has side arms. You can use this for support while you get dressed.  Do not have throw rugs and other things on the floor that can make you trip. What can I do in the kitchen?  Clean up any spills right away.  Avoid walking on wet floors.  Keep items that you use a lot in easy-to-reach places.  If you need to reach something above you, use a strong step stool that has a grab bar.  Keep electrical cords out of the way.  Do not use floor polish or wax that makes floors slippery. If you must use wax, use non-skid floor wax.  Do not have throw rugs and other things on the floor that can make you trip. What can I do with my stairs?  Do not leave any items on  the stairs.  Make sure that there are handrails on both sides of the stairs and use them. Fix handrails that are broken or loose. Make sure that handrails are as long as the stairways.  Check any carpeting to make sure that it is firmly attached to the stairs. Fix any carpet that is loose or worn.  Avoid having throw rugs at the top or bottom of the stairs. If you do have throw rugs, attach them to the floor with carpet tape.  Make sure that you have a light switch at the top of the stairs and the bottom of the stairs. If you do not have them, ask someone to add them for you. What else can I do to help prevent falls?  Wear shoes that:  Do not have high heels.  Have rubber bottoms.  Are comfortable and fit you well.  Are closed at the toe. Do not wear sandals.  If you use a stepladder:  Make sure that it is fully opened. Do not climb a closed stepladder.  Make  sure that both sides of the stepladder are locked into place.  Ask someone to hold it for you, if possible.  Clearly mark and make sure that you can see:  Any grab bars or handrails.  First and last steps.  Where the edge of each step is.  Use tools that help you move around (mobility aids) if they are needed. These include:  Canes.  Walkers.  Scooters.  Crutches.  Turn on the lights when you go into a dark area. Replace any light bulbs as soon as they burn out.  Set up your furniture so you have a clear path. Avoid moving your furniture around.  If any of your floors are uneven, fix them.  If there are any pets around you, be aware of where they are.  Review your medicines with your doctor. Some medicines can make you feel dizzy. This can increase your chance of falling. Ask your doctor what other things that you can do to help prevent falls. This information is not intended to replace advice given to you by your health care provider. Make sure you discuss any questions you have with your health care provider. Document Released: 05/25/2009 Document Revised: 01/04/2016 Document Reviewed: 09/02/2014 Elsevier Interactive Patient Education  2017 [employer].

## 2020-03-10 NOTE — Progress Notes (Signed)
I connected with Teresa [name] today by telephone and verified that I am speaking with the correct person using two identifiers. Location patient: home Location provider: work Persons participating in the virtual visit: Teresa [name], Teresa Durand LPN.   I discussed the limitations, risks, security and privacy concerns of performing an evaluation and management service by telephone and the availability of in person appointments. I also discussed with the patient that there may be a patient responsible charge related to this service. The patient expressed understanding and verbally consented to this telephonic visit.    Interactive audio and video telecommunications were attempted between this provider and patient, however failed, due to patient having technical difficulties OR patient did not have access to video capability.  We continued and completed visit with audio only.    Vital signs may be patient reported or missing.   Subjective:   Teresa [name] is a 74 y.o. female who presents for Medicare Annual (Subsequent) preventive examination.  Review of Systems     Cardiac Risk Factors include: advanced age (>34mn, >>59women);diabetes mellitus;dyslipidemia;hypertension;sedentary lifestyle     Objective:    Today's Vitals   03/10/20 1257 03/10/20 1258  Weight: 160 lb (72.6 kg)   Height: 5' 2" (1.575 m)   PainSc:  6    Body mass index is 29.26 kg/m.  Advanced Directives 03/10/2020 02/17/2019 02/04/2018 09/27/2017 09/26/2017 01/22/2017 01/08/2017  Does Patient Have a Medical Advance Directive? _0  No No  Does patient want to make changes to medical advance directive? - - No - Patient declined - - - -  Would patient like information on creating a medical advance directive? - Yes (MAU/Ambulatory/Procedural Areas - Information given) - No - Patient declined No - Patient declined Yes (Inpatient - patient requests chaplain consult to create a medical advance directive) Yes  (MAU/Ambulatory/Procedural Areas - Information given)    Current Medications (verified) Outpatient Encounter Medications as of 03/10/2020  Medication Sig  . blood glucose meter kit and supplies Dispense based on patient and insurance preference. Use up to four times daily as directed. (FOR ICD-9 250.00, 250.01).  . clobetasol ointment (TEMOVATE) 0.05 % Apply topically 2 (two) times daily.  . clopidogrel (PLAVIX) 75 MG tablet Take 1 tablet (75 mg total) by mouth daily.  . cyanocobalamin 100 MCG tablet Take 100 mcg by mouth daily.  . ferrous sulfate 325 (65 FE) MG tablet Take 1 tablet (325 mg total) by mouth 2 (two) times daily with a meal. (Patient taking differently: Take 325 mg by mouth daily with breakfast. )  . folic acid (FOLVITE) 8295MCG tablet Take 400 mcg by mouth daily.  .Marland Kitchenglucose blood test strip 1 each by Other route 2 (two) times daily. 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 hydrochlorothiazide (HYDRODIURIL) 25 MG tablet Take 1 tablet (25 mg total) by mouth daily.  . Lancets MISC 1 each by Does not apply route daily.  .Marland Kitchenlevothyroxine (SYNTHROID) 50 MCG tablet Take 1 tablet (50 mcg total) by mouth daily.  .Marland Kitchenlisinopril (ZESTRIL) 20 MG tablet Take 1 tablet (20 mg total) by mouth daily.  . metFORMIN (GLUCOPHAGE-XR) 500 MG 24 hr tablet TAKE 4 TABLETS(2,000MG     TOTAL) DAILY WITH BREAKFAST  . rosuvastatin (CRESTOR) 10 MG tablet Take 1 tablet (10 mg total) by mouth daily.   No facility-administered encounter medications on file as of 03/10/2020.    Allergies (verified) Atorvastatin and Evista [raloxifene]   History: Past Medical History:  Diagnosis Date  .  Diabetes mellitus without complication ([place])   . Heart murmur   . Hyperlipidemia   . Hypertension   . Lichen sclerosus   . Obesity   . Osteopenia   . Stroke ([place])   . Thyroid disease    Past Surgical History:  Procedure Laterality Date  . ROTATOR CUFF REPAIR Left   . 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     Family History  Problem  Relation Age of Onset  . Hypertension Mother   . Thyroid disease Mother   . Glaucoma Mother   . Coronary artery disease Mother   . Stroke Father   . Diabetes Brother   . Hypertension Maternal Grandfather   . Breast cancer Neg Hx    Social History   Socioeconomic History  . Marital status: Married    Spouse name: Not on file  . Number of children: Not on file  . Years of education: Not on file  . Highest education level: High school graduate  Occupational History  . Occupation: retired  Tobacco Use  . Smoking status: Former Smoker    Packs/day: 2.00    Types: Cigarettes    Quit date: 01/1965    Years since quitting: 55.2  . Smokeless tobacco: Never Used  Vaping Use  . Vaping Use: Never used  Substance and Sexual Activity  . Alcohol use: No    Alcohol/week: 0.0 standard drinks  . Drug use: No  . Sexual activity: Never  Other Topics Concern  . Not on file  Social History Narrative  . Not on file   Social Determinants of Health   Financial Resource Strain: Low Risk   . Difficulty of Paying Living Expenses: Not hard at all  Food Insecurity: No Food Insecurity  . Worried About Charity fundraiser in the Last Year: Never true  . Ran Out of Food in the Last Year: Never true  Transportation Needs: No Transportation Needs  . Lack of Transportation (Medical): No  . Lack of Transportation (Non-Medical): No  Physical Activity: Inactive  . Days of Exercise per Week: 0 days  . Minutes of Exercise per Session: 0 min  Stress: No Stress Concern Present  . Feeling of Stress : Not at all  Social Connections: Socially Integrated  . Frequency of Communication with Friends and Family: More than three times a week  . Frequency of Social Gatherings with Friends and Family: More than three times a week  . Attends Religious Services: More than 4 times per year  . Active Member of Clubs or Organizations: Yes  . Attends Archivist Meetings: More than 4 times per year  .  Marital Status: Married    Tobacco Counseling Counseling given: Not Answered   Clinical Intake:  Pre-visit preparation completed: Yes  Pain : 0-10 Pain Score: 6  Pain Type: Acute pain Pain Location: Throat (ears) Pain Descriptors / Indicators: Sore Pain Onset: In the past 7 days Pain Frequency: Constant     Nutritional Status: BMI 25 -29 Overweight Nutritional Risks: None Diabetes: Yes  How often do you need to have someone help you when you read instructions, pamphlets, or other written materials from your doctor or pharmacy?: 1 - Never What is the last grade level you completed in school?: GED  Diabetic? Yes Nutrition Risk Assessment:  Has the patient had any N/V/D within the last 2 months?  No  Does the patient have any non-healing wounds?  No  Has the patient had any unintentional weight loss or  weight gain?  No   Diabetes:  Is the patient diabetic?  Yes  If diabetic, was a CBG obtained today?  No  Did the patient bring in their glucometer from home?  No  How often do you monitor your CBG's? daily.   Financial Strains and Diabetes Management:  Are you having any financial strains with the device, your supplies or your medication? No .  Does the patient want to be seen by Chronic Care Management for management of their diabetes?  No  Would the patient like to be referred to a Nutritionist or for Diabetic Management?  No   Diabetic Exams:  Diabetic Eye Exam: Overdue for diabetic eye exam. Pt has been advised about the importance in completing this exam. Patient advised to call and schedule an eye exam. Diabetic Foot Exam: Completed 05/20/2019   Interpreter Needed?: No  Information entered by :: NAllen LPN   Activities of Daily Living In your present state of health, do you have any difficulty performing the following activities: 03/10/2020 05/20/2019  Hearing? Y N  Comment does not catch it sometimes -  Vision? N N  Difficulty concentrating or making  decisions? N N  Walking or climbing stairs? Y Y  Comment due back -  Dressing or bathing? N N  Doing errands, shopping? N N  Preparing Food and eating ? N -  Using the Toilet? N -  In the past six months, have you accidently leaked urine? Y -  Comment wears pad -  Do you have problems with loss of bowel control? N -  Managing your Medications? N -  Managing your Finances? N -  Housekeeping or managing your Housekeeping? N -  Some recent data might be hidden    Patient Care Team: [name], DO as PCP - General (Family Medicine) Kathrine Haddock, NP as PCP - Family Medicine (Nurse Practitioner) Kate Sable, MD as PCP - Cardiology (Cardiology) Sharlet Salina, MD as Referring Physician (Physical Medicine and Rehabilitation) Vanita Ingles, RN as Case Manager (General Practice)  Indicate any recent Medical Services you may have received from other than [facility] providers in the past year (date may be approximate).     Assessment:   This is a routine wellness examination for [place].  Hearing/Vision screen  Hearing Screening   125Hz 250Hz 500Hz 1000Hz 2000Hz 3000Hz 4000Hz 6000Hz 8000Hz  Right ear:           Left ear:           Vision Screening Comments: Regular eye exam, [facility]  Dietary issues and exercise activities discussed: Current Exercise Habits: The patient does not participate in regular exercise at present  Goals    .  DIET - INCREASE WATER INTAKE      Recommend drinking at least 6-8 glasses of water a day     .  Increase water intake      Recommend drinking at least 4-5 glasses of water a day.     .  Patient Stated      03/10/2020, no goals    .  RNCM: Pt_ "I am concerned about a letter I got about my medication" (pt-stated)      [place] (see longtitudinal plan of care for additional care plan information)  Current Barriers:  . Chronic Disease Management support, education, and care coordination needs related to HTN, HLD, DMII,  and CKD Stage 3  Clinical Goal(s) related to HTN, HLD, DMII, and CKD Stage 3 :  Over the next 120 days, patient will:  . Work with the care management team to address educational, disease management, and care coordination needs  . Begin or continue self health monitoring activities as directed today Measure and record cbg (blood glucose) 1 times daily, Measure and record blood pressure 2 times per week, and adhere to heart healthy/ADA diet . Call provider office for new or worsened signs and symptoms Blood glucose findings outside established parameters, Blood pressure findings outside established parameters, and New or worsened symptom related to CKD, HLD, and other chronic conditions . Call care management team with questions or concerns . Verbalize basic understanding of patient centered plan of care established today  Interventions related to HTN, HLD, DMII, and CKD Stage 3 :  . Evaluation of current treatment plans and patient's adherence to plan as established by provider.  The patient went to see a specialist last week for her back and they ended up sending her to the pcp because she had an irregular heart beat. The patient states she felt fine and had no issues. She was evaluated and had a sinus rhythm in the office. She has been referred to cardiology and will see them next week on 02-21-2020.  She said it was a little concerning to her but she feels fine. She says her heart rate normally runs in the 40-50 range.  . Assessed patient understanding of disease states.  The patient has a good understanding of her chronic  conditions. Denies any acute distress. The patient is compliant with the plan of care.  . Assessed patient's education and care coordination needs.  Education on talking to the cardiologist about her concerns and possible test they may want her to have to check her heart.  . Provided disease specific education to patient: on the benefits of taking blood pressure and recording to  bring to the provider visit. Will provide patient with log book at her appointment coming up in May.  The patient is taking her blood pressures and recording. States that in the morning it is a little elevated but midday to night it is in the 915'A systolic.  Marland Kitchen Evaluation of dietary intake. The patient is compliant with a heart healthy/ADA diet.  Nash Dimmer with appropriate clinical care team members regarding patient needs: Referral to CCM pharmacist concerning a letter the patient received concerning Plavix. She doesn't understand why she is getting the letter since she has been on it so long and would like to discuss it with the pcp and/or pharmacist.   Patient Self Care Activities related to HTN, HLD, DMII, and CKD Stage 3 :  . Patient is unable to independently self-manage chronic health conditions  Please see past updates related to this goal by clicking on the "Past Updates" button in the selected goal        Depression Screen PHQ 2/9 Scores 03/10/2020 11/16/2019 05/20/2019 02/17/2019 02/04/2018 08/06/2017 01/08/2017  PHQ - 2 Score 1 0 _0 0 0  PHQ- 9 Score - - 3 - 3 - 2    Fall Risk Fall Risk  03/10/2020 05/20/2019 02/17/2019 11/18/2018 02/05/2018  Falls in the past year? 0 0 0 0 No  Number falls in past yr: - 0 - 0 -  Injury with Fall? - 0 - 0 -  Comment - - - - -  Risk for fall due to : Medication side effect - - - -  Follow up Falls evaluation completed;Education provided;Falls prevention discussed - - - -  Any stairs in or around the home? Yes  If so, are there any without handrails? Yes  Home free of loose throw rugs in walkways, pet beds, electrical cords, etc? Yes  Adequate lighting in your home to reduce risk of falls? Yes   ASSISTIVE DEVICES UTILIZED TO PREVENT FALLS:  Life alert? No  Use of a cane, walker or w/c? No  Grab bars in the bathroom? Yes  Shower chair or bench in shower? Yes  Elevated toilet seat or a handicapped toilet? Yes   TIMED UP AND GO:  Was the  test performed? No   Cognitive Function:     6CIT Screen 03/10/2020 02/17/2019 02/04/2018 01/08/2017  What Year? 0 points 0 points 0 points 0 points  What month? 0 points 0 points 0 points 0 points  What time? 0 points 0 points 0 points 0 points  Count back from 20 0 points 0 points 0 points 0 points  Months in reverse 0 points 0 points 0 points 0 points  Repeat phrase 4 points 0 points 0 points 2 points  Total Score 4 0 0 2    Immunizations Immunization History  Administered Date(s) Administered  . Fluad Quad(high Dose 65+) 05/20/2019  . Influenza, High Dose Seasonal PF 07/10/2016, 04/18/2017, 05/26/2018  . Influenza,inj,Quad PF,6+ Mos 07/04/2015  . Moderna SARS-COVID-2 Vaccination 10/06/2019, 11/03/2019  . Pneumococcal Conjugate-13 04/13/2014  . Pneumococcal Polysaccharide-23 10/14/2014  . Td 03/25/2003  . Tdap 10/05/2010  . Zoster 09/10/2006    TDAP status: Up to date Flu Vaccine status: Up to date Pneumococcal vaccine status: Up to date Covid-19 vaccine status: Completed vaccines  Qualifies for Shingles Vaccine? Yes   Zostavax completed Yes   Shingrix Completed?: No.    Education has been provided regarding the importance of this vaccine. Patient has been advised to call insurance company to determine out of pocket expense if they have not yet received this vaccine. Advised may also receive vaccine at local pharmacy or Health Dept. Verbalized acceptance and understanding.  Screening Tests Health Maintenance  Topic Date Due  . COLONOSCOPY  04/02/2019  . OPHTHALMOLOGY EXAM  04/17/2019  . INFLUENZA VACCINE  03/12/2020  . FOOT EXAM  05/19/2020  . HEMOGLOBIN A1C  06/06/2020  . TETANUS/TDAP  10/05/2020  . MAMMOGRAM  03/30/2021  . DEXA SCAN  Completed  . COVID-19 Vaccine  Completed  . Hepatitis C Screening  Completed  . PNA vac Low Risk Adult  Completed    Health Maintenance  Health Maintenance Due  Topic Date Due  . COLONOSCOPY  04/02/2019  . OPHTHALMOLOGY EXAM   04/17/2019    Colorectal cancer screening: Due Mammogram status: Completed 03/31/2019. Repeat every year Bone Density status: Completed 01/22/2016.   Lung Cancer Screening: (Low Dose CT Chest recommended if Age 60-80 years, 30 pack-year currently smoking OR have quit w/in 15years.) does not qualify.   Lung Cancer Screening Referral: no   Additional Screening:  Hepatitis C Screening: does qualify; Completed 01/03/2016  Vision Screening: Recommended annual ophthalmology exams for early detection of glaucoma and other disorders of the eye. Is the patient up to date with their annual eye exam?  No  Who is the provider or what is the name of the office in which the patient attends annual eye exams? [facility] If pt is not established with a provider, would they like to be referred to a provider to establish care? No .   Dental Screening: Recommended annual dental exams for proper oral hygiene  Community Resource Referral / Chronic Care Management: CRR required this visit?  No   CCM required this visit?  No      Plan:     I have personally reviewed and noted the following in the patient's chart:   . Medical and social history . Use of alcohol, tobacco or illicit drugs  . Current medications and supplements . Functional ability and status . Nutritional status . Physical activity . Advanced directives . List of other physicians . Hospitalizations, surgeries, and ER visits in previous 12 months . Vitals . Screenings to include cognitive, depression, and falls . Referrals and appointments  In addition, I have reviewed and discussed with patient certain preventive protocols, quality metrics, and best practice recommendations. A written personalized care plan for preventive services as well as general preventive health recommendations were provided to patient.     Kellie Simmering, LPN   12/14/[id number]   Nurse Notes:

## 2020-04-03 ENCOUNTER — Encounter: Payer: Self-pay | Admitting: Cardiology

## 2020-04-03 ENCOUNTER — Other Ambulatory Visit: Payer: Self-pay

## 2020-04-03 ENCOUNTER — Ambulatory Visit: Payer: Self-pay | Admitting: Cardiology

## 2020-04-03 ENCOUNTER — Ambulatory Visit: Payer: Medicare HMO | Admitting: Cardiology

## 2020-04-03 VITALS — BP 120/64 | HR 60 | Ht 62.0 in | Wt 162.5 lb

## 2020-04-03 DIAGNOSIS — I472 Ventricular tachycardia: Secondary | ICD-10-CM

## 2020-04-03 DIAGNOSIS — I10 Essential (primary) hypertension: Secondary | ICD-10-CM | POA: Diagnosis not present

## 2020-04-03 DIAGNOSIS — I4729 Other ventricular tachycardia: Secondary | ICD-10-CM

## 2020-04-03 DIAGNOSIS — E7800 Pure hypercholesterolemia, unspecified: Secondary | ICD-10-CM

## 2020-04-03 NOTE — Patient Instructions (Signed)

## 2020-04-03 NOTE — Progress Notes (Signed)
Cardiology Office Note:    Date:  04/03/2020   ID:  Teresa [name], DOB Dec 27, 1945, MRN [id number]  PCP:  [name], DO  [facility] Cardiologist:  Kate Sable, MD  [place] Electrophysiologist:  None   Referring MD: [name], DO   Chief Complaint  Patient presents with  . office visit    F/U after wearing ZIO monitor; Meds verbally reviewed with patient.    History of Present Illness:    Teresa [name] is a 74 y.o. female with a hx of hypertension, hyperlipidemia, diabetes, CVA who presents for follow-up. Last seen due to irregular heartbeat. Irregular heartbeat noted on physical exam. Denies symptoms of chest pain or shortness of breath. Due to history of CVA, cardiac monitor placed to evaluate any significant arrhythmia such as A. fib or flutter.  Patient denies any symptoms of palpitations, chest pain or shortness of breath.  She otherwise feels well.   Previous echocardiogram on 04/2018 showed normal systolic function, EF 60 to 65%, pseudonormalization.  Past Medical History:  Diagnosis Date  . Diabetes mellitus without complication ([place])   . Heart murmur   . Hyperlipidemia   . Hypertension   . Lichen sclerosus   . Obesity   . Osteopenia   . Stroke ([place])   . Thyroid disease     Past Surgical History:  Procedure Laterality Date  . ROTATOR CUFF REPAIR Left   . 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 blood glucose meter kit and supplies Dispense based on patient and insurance preference. Use up to four times daily as directed. (FOR ICD-9 250.00, 250.01).  . clobetasol ointment (TEMOVATE) 0.05 % Apply topically 2 (two) times daily.  . clopidogrel (PLAVIX) 75 MG tablet Take 1 tablet (75 mg total) by mouth daily.  . cyanocobalamin 100 MCG tablet Take 100 mcg by mouth daily.  . ferrous sulfate 325 (65 FE) MG tablet Take 650 mg by mouth daily with breakfast.  . folic acid (FOLVITE) 202  MCG tablet Take 400 mcg by mouth daily.  Marland Kitchen glucose blood test strip 1 each by Other route 2 (two) times daily. 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 hydrochlorothiazide (HYDRODIURIL) 25 MG tablet Take 1 tablet (25 mg total) by mouth daily.  . Lancets MISC 1 each by Does not apply route daily.  Marland Kitchen levothyroxine (SYNTHROID) 50 MCG tablet Take 1 tablet (50 mcg total) by mouth daily.  Marland Kitchen lisinopril (ZESTRIL) 20 MG tablet Take 1 tablet (20 mg total) by mouth daily.  . metFORMIN (GLUCOPHAGE-XR) 500 MG 24 hr tablet TAKE 4 TABLETS(2,$RemoveBeforeDE'000MG'CloucnFwrPmsRvo$      TOTAL) DAILY WITH BREAKFAST  . rosuvastatin (CRESTOR) 10 MG tablet Take 1 tablet (10 mg total) by mouth daily.     Allergies:   Atorvastatin and Evista [raloxifene]   Social History   Socioeconomic History  . Marital status: Married    Spouse name: Not on file  . Number of children: Not on file  . Years of education: Not on file  . Highest education level: High school graduate  Occupational History  . Occupation: retired  Tobacco Use  . Smoking status: Former Smoker    Packs/day: 2.00    Types: Cigarettes    Quit date: 01/1965    Years since quitting: 55.2  . Smokeless tobacco: Never Used  Vaping Use  . Vaping Use: Never used  Substance and Sexual Activity  . Alcohol use: No  Alcohol/week: 0.0 standard drinks  . Drug use: No  . Sexual activity: Never  Other Topics Concern  . Not on file  Social History Narrative  . Not on file   Social Determinants of Health   Financial Resource Strain: Low Risk   . Difficulty of Paying Living Expenses: Not hard at all  Food Insecurity: No Food Insecurity  . Worried About Charity fundraiser in the Last Year: Never true  . Ran Out of Food in the Last Year: Never true  Transportation Needs: No Transportation Needs  . Lack of Transportation (Medical): No  . Lack of Transportation (Non-Medical): No  Physical Activity: Inactive  . Days of Exercise per Week: 0 days  . Minutes of Exercise per Session: 0 min   Stress: No Stress Concern Present  . Feeling of Stress : Not at all  Social Connections: Socially Integrated  . Frequency of Communication with Friends and Family: More than three times a week  . Frequency of Social Gatherings with Friends and Family: More than three times a week  . Attends Religious Services: More than 4 times per year  . Active Member of Clubs or Organizations: Yes  . Attends Archivist Meetings: More than 4 times per year  . Marital Status: Married     Family History: The patient's family history includes Coronary artery disease in her mother; Diabetes in her brother; Glaucoma in her mother; Hypertension in her maternal grandfather and mother; Stroke in her father; Thyroid disease in her mother. There is no history of Breast cancer.  ROS:   Please see the history of present illness.     All other systems reviewed and are negative.  EKGs/Labs/Other Studies Reviewed:    The following studies were reviewed today:   EKG:  EKG is  ordered today.  The ekg not ordered today   Recent Labs: 12/06/2019: ALT 13; Hemoglobin 10.2; Platelets 198; TSH 2.890 02/02/2020: BUN 27; Creatinine, Ser 1.09; Potassium 4.6; Sodium 144  Recent Lipid Panel    Component Value Date/Time   CHOL 131 12/06/2019 1601   CHOL 147 07/04/2015 0820   TRIG 78 12/06/2019 1601   TRIG 89 07/04/2015 0820   HDL 58 12/06/2019 1601   VLDL 18 07/04/2015 0820   LDLCALC 58 12/06/2019 1601    Physical Exam:    VS:  BP 120/64 (BP Location: Left Arm, Patient Position: Sitting, Cuff Size: Normal)   Pulse 60   Ht $R'5\' 2"'xW$  (1.575 m)   Wt 162 lb 8 oz (73.7 kg)   LMP  (LMP Unknown)   SpO2 99%   BMI 29.72 kg/m     Wt Readings from Last 3 Encounters:  04/03/20 162 lb 8 oz (73.7 kg)  03/10/20 160 lb (72.6 kg)  02/21/20 160 lb 8 oz (72.8 kg)     GEN:  Well nourished, well developed in no acute distress HEENT: Normal NECK: No JVD; No carotid bruits LYMPHATICS: No lymphadenopathy CARDIAC:  RRR, no murmurs, rubs, gallops RESPIRATORY:  Clear to auscultation without rales, wheezing or rhonchi  ABDOMEN: Soft, non-tender, non-distended MUSCULOSKELETAL:  No edema; No deformity  SKIN: Warm and dry NEUROLOGIC:  Alert and oriented x 3 PSYCHIATRIC:  Normal affect   ASSESSMENT:    1. NSVT (nonsustained ventricular tachycardia) ([place])   2. Essential hypertension   3. Pure hypercholesterolemia    PLAN:    In order of problems listed above:  1. Previous abnormal heartbeat noted on exam, history of CVA. Cardiac monitor  with no evidence for A. fib or flutter. 6 beat run nonsustained VT noted. Occasional paroxysmal SVT noted. Previous echo in 2019 showed normal systolic function, grade 2 diastolic dysfunction. We will repeat echocardiogram due to arrhythmias noted on cardiac monitor.  If echocardiogram has normal systolic function, no need for additional medications.  Continue to monitor as patient is asymptomatic. 2. History of hypertension.  Continue lisinopril, hctz. 3. History of hyperlipidemia, continue Crestor.  Follow-up after echo  Total encounter time 40 minutes  Greater than 50% was spent in counseling and coordination of care with the patient   This note was generated in part or whole with voice recognition software. Voice recognition is usually quite accurate but there are transcription errors that can and very often do occur. I apologize for any typographical errors that were not detected and corrected.  Medication Adjustments/Labs and Tests Ordered: Current medicines are reviewed at length with the patient today.  Concerns regarding medicines are outlined above.  Orders Placed This Encounter  Procedures  . ECHOCARDIOGRAM COMPLETE   No orders of the defined types were placed in this encounter.   Patient Instructions  Medication Instructions:  Your physician recommends that you continue on your current medications as directed. Please refer to the Current Medication  list given to you today. *If you need a refill on your cardiac medications before your next appointment, please call your pharmacy*   Lab Work: None Ordered If you have labs (blood work) drawn today and your tests are completely normal, you will receive your results only by: Marland Kitchen MyChart Message (if you have MyChart) OR . A paper copy in the mail If you have any lab test that is abnormal or we need to change your treatment, we will call you to review the results.   Testing/Procedures:  Your physician has requested that you have an echocardiogram. Echocardiography is a painless test that uses sound waves to create images of your heart. It provides your doctor with information about the size and shape of your heart and how well your heart's chambers and valves are working. This procedure takes approximately one hour. There are no restrictions for this procedure.    Follow-Up: At [facility], you and your health needs are our priority.  As part of our continuing mission to provide you with exceptional heart care, we have created designated Provider Care Teams.  These Care Teams include your primary Cardiologist (physician) and Advanced Practice Providers (APPs -  Physician Assistants and Nurse Practitioners) who all work together to provide you with the care you need, when you need it.  We recommend signing up for the patient portal called "MyChart".  Sign up information is provided on this After Visit Summary.  MyChart is used to connect with patients for Virtual Visits (Telemedicine).  Patients are able to view lab/test results, encounter notes, upcoming appointments, etc.  Non-urgent messages can be sent to your provider as well.   To learn more about what you can do with MyChart, go to [email or web address].    Your next appointment:   Follow up after echo   The format for your next appointment:   In Person  Provider:   Kate Sable, MD   Other Instructions       Signed, Kate Sable, MD  04/03/2020 12:21 PM    [place]

## 2020-04-05 ENCOUNTER — Other Ambulatory Visit: Payer: Self-pay | Admitting: Family Medicine

## 2020-04-05 DIAGNOSIS — Z1231 Encounter for screening mammogram for malignant neoplasm of breast: Secondary | ICD-10-CM

## 2020-04-19 ENCOUNTER — Other Ambulatory Visit: Payer: Self-pay | Admitting: Nurse Practitioner

## 2020-04-26 ENCOUNTER — Other Ambulatory Visit: Payer: Self-pay

## 2020-04-26 ENCOUNTER — Ambulatory Visit (INDEPENDENT_AMBULATORY_CARE_PROVIDER_SITE_OTHER): Payer: Medicare HMO

## 2020-04-26 DIAGNOSIS — I472 Ventricular tachycardia: Secondary | ICD-10-CM | POA: Diagnosis not present

## 2020-04-26 DIAGNOSIS — I4729 Other ventricular tachycardia: Secondary | ICD-10-CM

## 2020-04-26 LAB — ECHOCARDIOGRAM COMPLETE
AR max vel: 1.61 cm2
AV Area VTI: 1.69 cm2
AV Area mean vel: 1.75 cm2
AV Mean grad: 5.5 mmHg
AV Peak grad: 11.9 mmHg
Ao pk vel: 1.73 m/s
Area-P 1/2: 4.8 cm2
Calc EF: 54.6 %
S' Lateral: 3.1 cm
Single Plane A2C EF: 55.4 %
Single Plane A4C EF: 56.5 %

## 2020-05-02 ENCOUNTER — Ambulatory Visit (INDEPENDENT_AMBULATORY_CARE_PROVIDER_SITE_OTHER): Payer: Medicare HMO | Admitting: Family Medicine

## 2020-05-02 ENCOUNTER — Other Ambulatory Visit: Payer: Self-pay

## 2020-05-02 ENCOUNTER — Telehealth: Payer: Self-pay

## 2020-05-02 ENCOUNTER — Telehealth: Payer: Self-pay | Admitting: General Practice

## 2020-05-02 ENCOUNTER — Encounter: Payer: Self-pay | Admitting: Family Medicine

## 2020-05-02 VITALS — BP 135/45 | HR 54 | Temp 97.9°F | Wt 162.0 lb

## 2020-05-02 DIAGNOSIS — Z23 Encounter for immunization: Secondary | ICD-10-CM

## 2020-05-02 DIAGNOSIS — R197 Diarrhea, unspecified: Secondary | ICD-10-CM

## 2020-05-02 DIAGNOSIS — Z1211 Encounter for screening for malignant neoplasm of colon: Secondary | ICD-10-CM

## 2020-05-02 MED ORDER — CLOBETASOL PROPIONATE 0.05 % EX OINT
TOPICAL_OINTMENT | Freq: Two times a day (BID) | CUTANEOUS | 3 refills | Status: DC
Start: 2020-05-02 — End: 2020-05-09

## 2020-05-02 NOTE — Progress Notes (Signed)
BP (!) 135/45   Pulse (!) 54   Temp 97.9 F (36.6 C) (Oral)   Wt 162 lb (73.5 kg)   LMP  (LMP Unknown)   SpO2 100%   BMI 29.63 kg/m    Subjective:    Patient ID: [name], female    DOB: October 17, 1945, 74 y.o.   MRN: [id number]  HPI: CIGI BEGA is a 74 y.o. female  Chief Complaint  Patient presents with  . Abdominal Pain    pt states has had this symptoms last week. states has been feeling better this week  . Diarrhea  . Gas  . Medication Refill    clobetasol ointment   ABDOMINAL PAIN  Duration: about a week, better now Onset: sudden Severity: severe Quality: cramping, gassy pain Location:  diffuse  Radiation: no Frequency: intermittent Alleviating factors: none Aggravating factors: none Status: better Treatments attempted: none Fever: no Nausea: no Vomiting: no Weight loss: no Decreased appetite: no Diarrhea: yes Constipation: no Blood in stool: no Heartburn: no Jaundice: no Rash: no Dysuria/urinary frequency: no Hematuria: no History of sexually transmitted disease: no Recurrent NSAID use: no   Relevant past medical, surgical, family and social history reviewed and updated as indicated. Interim medical history since our last visit reviewed. Allergies and medications reviewed and updated.  Review of Systems  Constitutional: Negative.   Respiratory: Negative.   Cardiovascular: Negative.   Gastrointestinal: Negative.   Musculoskeletal: Negative.   Neurological: Negative.   Psychiatric/Behavioral: Negative.     Per HPI unless specifically indicated above     Objective:    BP (!) 135/45   Pulse (!) 54   Temp 97.9 F (36.6 C) (Oral)   Wt 162 lb (73.5 kg)   LMP  (LMP Unknown)   SpO2 100%   BMI 29.63 kg/m   Wt Readings from Last 3 Encounters:  05/02/20 162 lb (73.5 kg)  04/03/20 162 lb 8 oz (73.7 kg)  03/10/20 160 lb (72.6 kg)    Physical Exam Vitals and nursing note reviewed.  Constitutional:      General: She is not  in acute distress.    Appearance: Normal appearance. She is not ill-appearing, toxic-appearing or diaphoretic.  HENT:     Head: Normocephalic and atraumatic.     Right Ear: External ear normal.     Left Ear: External ear normal.     Nose: Nose normal.     Mouth/Throat:     Mouth: Mucous membranes are moist.     Pharynx: Oropharynx is clear.  Eyes:     General: No scleral icterus.       Right eye: No discharge.        Left eye: No discharge.     Extraocular Movements: Extraocular movements intact.     Conjunctiva/sclera: Conjunctivae normal.     Pupils: Pupils are equal, round, and reactive to light.  Cardiovascular:     Rate and Rhythm: Normal rate and regular rhythm.     Pulses: Normal pulses.     Heart sounds: Normal heart sounds. No murmur heard.  No friction rub. No gallop.   Pulmonary:     Effort: Pulmonary effort is normal. No respiratory distress.     Breath sounds: Normal breath sounds. No stridor. No wheezing, rhonchi or rales.  Chest:     Chest wall: No tenderness.  Musculoskeletal:        General: Normal range of motion.     Cervical back: Normal range of motion and  neck supple.  Skin:    General: Skin is warm and dry.     Capillary Refill: Capillary refill takes less than 2 seconds.     Coloration: Skin is not jaundiced or pale.     Findings: No bruising, erythema, lesion or rash.  Neurological:     General: No focal deficit present.     Mental Status: She is alert and oriented to person, place, and time. Mental status is at baseline.  Psychiatric:        Mood and Affect: Mood normal.        Behavior: Behavior normal.        Thought Content: Thought content normal.        Judgment: Judgment normal.     Results for orders placed or performed in visit on 04/26/20  ECHOCARDIOGRAM COMPLETE  Result Value Ref Range   AR max vel 1.61 cm2   AV Peak grad 11.9 mmHg   Ao pk vel 1.73 m/s   S' Lateral 3.10 cm   Area-P 1/2 4.80 cm2   AV Area VTI 1.69 cm2   AV Mean  grad 5.5 mmHg   Single Plane A4C EF 56.5 %   Single Plane A2C EF 55.4 %   Calc EF 54.6 %   AV Area mean vel 1.75 cm2      Assessment & Plan:   Problem List Items Addressed This Visit    None    Visit Diagnoses    Diarrhea, unspecified type    -  Primary   Now resolved. Will check CBC and CMP. Gentle diet. all with any concerns or if getting worse.    Relevant Orders   CBC with Differential/Platelet   Comprehensive metabolic panel   Flu vaccine need       Flu shot given today.   Relevant Orders   Flu Vaccine QUAD High Dose(Fluad) (Completed)   Colon cancer screening       Referral to GI placed today.   Relevant Orders   Ambulatory referral to Gastroenterology       Follow up plan: Return As scheduled.

## 2020-05-02 NOTE — Telephone Encounter (Signed)
  Chronic Care Management   Outreach Note  05/02/2020 Name: Teresa [name] MRN: [id number] DOB: 1945/12/18  Referred by: [name], DO Reason for referral : Chronic Care Management (RNCM Follow up for Chronic Disease Management and Care Coordination Needs)   An unsuccessful telephone outreach was attempted today. The patient was referred to the case management team for assistance with care management and care coordination. Called x 2. The call connected and then was disconnected on the other end.   Follow Up Plan: The care management team will reach out to the patient again over the next 30 to 60 days.   Noreene Larsson RN, MSN, [facility] Mobile: [phone]

## 2020-05-02 NOTE — Patient Instructions (Signed)
Food Choices to Help Relieve Diarrhea, Adult When you have diarrhea, the foods you eat and your eating habits are very important. Choosing the right foods and drinks can help:  Relieve diarrhea.  Replace lost fluids and nutrients.  Prevent dehydration. What general guidelines should I follow?  Relieving diarrhea  Choose foods with less than 2 g or .07 oz. of fiber per serving.  Limit fats to less than 8 tsp (38 g or 1.34 oz.) a day.  Avoid the following: ? Foods and beverages sweetened with high-fructose corn syrup, honey, or sugar alcohols such as xylitol, sorbitol, and mannitol. ? Foods that contain a lot of fat or sugar. ? Fried, greasy, or spicy foods. ? High-fiber grains, breads, and cereals. ? Raw fruits and vegetables.  Eat foods that are rich in probiotics. These foods include dairy products such as yogurt and fermented milk products. They help increase healthy bacteria in the stomach and intestines (gastrointestinal tract, or GI tract).  If you have lactose intolerance, avoid dairy products. These may make your diarrhea worse.  Take medicine to help stop diarrhea (antidiarrheal medicine) only as told by your health care provider. Replacing nutrients  Eat small meals or snacks every 3-4 hours.  Eat bland foods, such as white rice, toast, or baked potato, until your diarrhea starts to get better. Gradually reintroduce nutrient-rich foods as tolerated or as told by your health care provider. This includes: ? Well-cooked protein foods. ? Peeled, seeded, and soft-cooked fruits and vegetables. ? Low-fat dairy products.  Take vitamin and mineral supplements as told by your health care provider. Preventing dehydration  Start by sipping water or a special solution to prevent dehydration (oral rehydration solution, ORS). Urine that is clear or pale yellow means that you are getting enough fluid.  Try to drink at least 8-10 cups of fluid each day to help replace lost  fluids.  You may add other liquids in addition to water, such as clear juice or decaffeinated sports drinks, as tolerated or as told by your health care provider.  Avoid drinks with caffeine, such as coffee, tea, or soft drinks.  Avoid alcohol. What foods are recommended?     The items listed may not be a complete list. Talk with your health care provider about what dietary choices are best for you. Grains White rice. White, French, or pita breads (fresh or toasted), including plain rolls, buns, or bagels. White pasta. Saltine, soda, or graham crackers. Pretzels. Low-fiber cereal. Cooked cereals made with water (such as cornmeal, farina, or cream cereals). Plain muffins. Matzo. Melba toast. Zwieback. Vegetables Potatoes (without the skin). Most well-cooked and canned vegetables without skins or seeds. Tender lettuce. Fruits Apple sauce. Fruits canned in juice. Cooked apricots, cherries, grapefruit, peaches, pears, or plums. Fresh bananas and cantaloupe. Meats and other protein foods Baked or boiled chicken. Eggs. Tofu. Fish. Seafood. Smooth nut butters. Ground or well-cooked tender beef, ham, veal, lamb, pork, or poultry. Dairy Plain yogurt, kefir, and unsweetened liquid yogurt. Lactose-free milk, buttermilk, skim milk, or soy milk. Low-fat or nonfat hard cheese. Beverages Water. Low-calorie sports drinks. Fruit juices without pulp. Strained tomato and vegetable juices. Decaffeinated teas. Sugar-free beverages not sweetened with sugar alcohols. Oral rehydration solutions, if approved by your health care provider. Seasoning and other foods Bouillon, broth, or soups made from recommended foods. What foods are not recommended? The items listed may not be a complete list. Talk with your health care provider about what dietary choices are best for you. Grains Whole   grain, whole wheat, bran, or rye breads, rolls, pastas, and crackers. Wild or brown rice. Whole grain or bran cereals. Barley.  Oats and oatmeal. Corn tortillas or taco shells. Granola. Popcorn. Vegetables Raw vegetables. Fried vegetables. Cabbage, broccoli, Brussels sprouts, artichokes, baked beans, beet greens, corn, kale, legumes, peas, sweet potatoes, and yams. Potato skins. Cooked spinach and cabbage. Fruits Dried fruit, including raisins and dates. Raw fruits. Stewed or dried prunes. Canned fruits with syrup. Meat and other protein foods Fried or fatty meats. Deli meats. Chunky nut butters. Nuts and seeds. Beans and lentils. Bacon. Hot dogs. Sausage. Dairy High-fat cheeses. Whole milk, chocolate milk, and beverages made with milk, such as milk shakes. Half-and-half. Cream. sour cream. Ice cream. Beverages Caffeinated beverages (such as coffee, tea, soda, or energy drinks). Alcoholic beverages. Fruit juices with pulp. Prune juice. Soft drinks sweetened with high-fructose corn syrup or sugar alcohols. High-calorie sports drinks. Fats and oils Butter. Cream sauces. Margarine. Salad oils. Plain salad dressings. Olives. Avocados. Mayonnaise. Sweets and desserts Sweet rolls, doughnuts, and sweet breads. Sugar-free desserts sweetened with sugar alcohols such as xylitol and sorbitol. Seasoning and other foods Honey. Hot sauce. Chili powder. Gravy. Cream-based or milk-based soups. Pancakes and waffles. Summary  When you have diarrhea, the foods you eat and your eating habits are very important.  Make sure you get at least 8-10 cups of fluid each day, or enough to keep your urine clear or pale yellow.  Eat bland foods and gradually reintroduce healthy, nutrient-rich foods as tolerated, or as told by your health care provider.  Avoid high-fiber, fried, greasy, or spicy foods. This information is not intended to replace advice given to you by your health care provider. Make sure you discuss any questions you have with your health care provider. Document Revised: 11/19/2018 Document Reviewed: 07/26/2016 Elsevier Patient  Education  2020 [employer]  

## 2020-05-03 ENCOUNTER — Ambulatory Visit
Admission: RE | Admit: 2020-05-03 | Discharge: 2020-05-03 | Disposition: A | Discharge status: Home / Self Care | Payer: Medicare HMO | Source: Ambulatory Visit | Attending: Family Medicine | Admitting: Family Medicine

## 2020-05-03 DIAGNOSIS — Z1231 Encounter for screening mammogram for malignant neoplasm of breast: Secondary | ICD-10-CM | POA: Diagnosis not present

## 2020-05-03 LAB — COMPREHENSIVE METABOLIC PANEL
ALT: 10 IU/L (ref 0–32)
AST: 16 IU/L (ref 0–40)
Albumin/Globulin Ratio: 2 (ref 1.2–2.2)
Albumin: 4.5 g/dL (ref 3.7–4.7)
Alkaline Phosphatase: 88 IU/L (ref 44–121)
BUN/Creatinine Ratio: 23 (ref 12–28)
BUN: 26 mg/dL (ref 8–27)
Bilirubin Total: 0.2 mg/dL (ref 0.0–1.2)
CO2: 22 mmol/L (ref 20–29)
Calcium: 9.3 mg/dL (ref 8.7–10.3)
Chloride: 109 mmol/L — ABNORMAL HIGH (ref 96–106)
Creatinine, Ser: 1.15 mg/dL — ABNORMAL HIGH (ref 0.57–1.00)
GFR calc Af Amer: 55 mL/min/{1.73_m2} — ABNORMAL LOW (ref >59)
GFR calc non Af Amer: 47 mL/min/{1.73_m2} — ABNORMAL LOW (ref >59)
Globulin, Total: 2.2 g/dL (ref 1.5–4.5)
Glucose: 179 mg/dL — ABNORMAL HIGH (ref 65–99)
Potassium: 4.4 mmol/L (ref 3.5–5.2)
Sodium: 144 mmol/L (ref 134–144)
Total Protein: 6.7 g/dL (ref 6.0–8.5)

## 2020-05-03 LAB — CBC WITH DIFFERENTIAL/PLATELET
Basophils Absolute: 0 10*3/uL (ref 0.0–0.2)
Basos: 1 %
EOS (ABSOLUTE): 0.1 10*3/uL (ref 0.0–0.4)
Eos: 1 %
Hematocrit: 32.1 % — ABNORMAL LOW (ref 34.0–46.6)
Hemoglobin: 10.5 g/dL — ABNORMAL LOW (ref 11.1–15.9)
Immature Grans (Abs): 0 10*3/uL (ref 0.0–0.1)
Immature Granulocytes: 0 %
Lymphocytes Absolute: 2.6 10*3/uL (ref 0.7–3.1)
Lymphs: 37 %
MCH: 30.4 pg (ref 26.6–33.0)
MCHC: 32.7 g/dL (ref 31.5–35.7)
MCV: 93 fL (ref 79–97)
Monocytes Absolute: 0.7 10*3/uL (ref 0.1–0.9)
Monocytes: 10 %
Neutrophils Absolute: 3.7 10*3/uL (ref 1.4–7.0)
Neutrophils: 51 %
Platelets: 230 10*3/uL (ref 150–450)
RBC: 3.45 x10E6/uL — ABNORMAL LOW (ref 3.77–5.28)
RDW: 13 % (ref 11.7–15.4)
WBC: 7.1 10*3/uL (ref 3.4–10.8)

## 2020-05-04 ENCOUNTER — Other Ambulatory Visit: Payer: Self-pay

## 2020-05-04 ENCOUNTER — Ambulatory Visit: Payer: Medicare HMO | Admitting: Cardiology

## 2020-05-04 ENCOUNTER — Encounter: Payer: Self-pay | Admitting: Cardiology

## 2020-05-04 VITALS — BP 116/60 | HR 56 | Ht 62.0 in | Wt 161.0 lb

## 2020-05-04 DIAGNOSIS — I472 Ventricular tachycardia: Secondary | ICD-10-CM

## 2020-05-04 DIAGNOSIS — E7800 Pure hypercholesterolemia, unspecified: Secondary | ICD-10-CM

## 2020-05-04 DIAGNOSIS — I272 Pulmonary hypertension, unspecified: Secondary | ICD-10-CM | POA: Diagnosis not present

## 2020-05-04 DIAGNOSIS — I5189 Other ill-defined heart diseases: Secondary | ICD-10-CM

## 2020-05-04 DIAGNOSIS — I10 Essential (primary) hypertension: Secondary | ICD-10-CM | POA: Diagnosis not present

## 2020-05-04 DIAGNOSIS — I4729 Other ventricular tachycardia: Secondary | ICD-10-CM

## 2020-05-04 NOTE — Progress Notes (Signed)
Cardiology Office Note:    Date:  05/04/2020   ID:  Teresa [name], DOB [birth year], MRN [id number]  PCP:  [name], DO  [facility] Cardiologist:  Kate Sable, MD  [place] Electrophysiologist:  None   Referring MD: [name], DO   Chief Complaint  Patient presents with  . Follow-up    follow up echo    History of Present Illness:    Teresa [name] is a 74 y.o. female with a hx of hypertension, hyperlipidemia, diabetes, CVA who presents for follow-up.  Patient previously seen due to irregular heartbeats, cardiac monitor showed 6 beat nonsustained VT, occasional paroxysmal SVTs.  No A. fib or flutter was noted.  Echocardiogram was obtained to evaluate any structural abnormalities.  Patient denies chest pain, shortness of breath, palpitations, edema.  She feels well, able to perform her activities of daily living without any symptoms.  Would like to know echocardiogram results, has no concerns at this time.  Past Medical History:  Diagnosis Date  . Diabetes mellitus without complication ([place])   . Heart murmur   . Hyperlipidemia   . Hypertension   . Lichen sclerosus   . Obesity   . Osteopenia   . Stroke ([place])   . Thyroid disease     Past Surgical History:  Procedure Laterality Date  . ROTATOR CUFF REPAIR Left   . 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 blood glucose meter kit and supplies Dispense based on patient and insurance preference. Use up to four times daily as directed. (FOR ICD-9 250.00, 250.01).  . clobetasol ointment (TEMOVATE) 0.05 % Apply topically 2 (two) times daily.  . clopidogrel (PLAVIX) 75 MG tablet Take 1 tablet (75 mg total) by mouth daily.  . cyanocobalamin 100 MCG tablet Take 100 mcg by mouth daily.  . ferrous sulfate 325 (65 FE) MG tablet Take 1 tablet (325 mg total) by mouth 2 (two) times daily with a meal. (Patient taking differently: Take 325 mg by mouth daily  with breakfast. )  . fluticasone (FLONASE) 50 MCG/ACT nasal spray Place 1 spray into both nostrils 2 (two) times daily.  . folic acid (FOLVITE) 751 MCG tablet Take 400 mcg by mouth daily.  Marland Kitchen glucose blood test strip 1 each by Other route 2 (two) times daily. 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 hydrochlorothiazide (HYDRODIURIL) 25 MG tablet Take 1 tablet (25 mg total) by mouth daily.  . Lancets MISC 1 each by Does not apply route daily.  Marland Kitchen levothyroxine (SYNTHROID) 50 MCG tablet Take 1 tablet (50 mcg total) by mouth daily.  Marland Kitchen lisinopril (ZESTRIL) 20 MG tablet Take 1 tablet (20 mg total) by mouth daily.  . metFORMIN (GLUCOPHAGE-XR) 500 MG 24 hr tablet TAKE 4 TABLETS(2,$RemoveBeforeDE'000MG'OOWkSyOyZBMLoeX$      TOTAL) DAILY WITH BREAKFAST  . rosuvastatin (CRESTOR) 10 MG tablet Take 1 tablet (10 mg total) by mouth daily.     Allergies:   Atorvastatin and Evista [raloxifene]   Social History   Socioeconomic History  . Marital status: Married    Spouse name: Not on file  . Number of children: Not on file  . Years of education: Not on file  . Highest education level: High school graduate  Occupational History  . Occupation: retired  Tobacco Use  . Smoking status: Former Smoker    Packs/day: 2.00    Types: Cigarettes    Quit date: 01/1965  Years since quitting: 55.3  . Smokeless tobacco: Never Used  Vaping Use  . Vaping Use: Never used  Substance and Sexual Activity  . Alcohol use: No    Alcohol/week: 0.0 standard drinks  . Drug use: No  . Sexual activity: Never  Other Topics Concern  . Not on file  Social History Narrative  . Not on file   Social Determinants of Health   Financial Resource Strain: Low Risk   . Difficulty of Paying Living Expenses: Not hard at all  Food Insecurity: No Food Insecurity  . Worried About Charity fundraiser in the Last Year: Never true  . Ran Out of Food in the Last Year: Never true  Transportation Needs: No Transportation Needs  . Lack of Transportation (Medical): No  . Lack  of Transportation (Non-Medical): No  Physical Activity: Inactive  . Days of Exercise per Week: 0 days  . Minutes of Exercise per Session: 0 min  Stress: No Stress Concern Present  . Feeling of Stress : Not at all  Social Connections: Socially Integrated  . Frequency of Communication with Friends and Family: More than three times a week  . Frequency of Social Gatherings with Friends and Family: More than three times a week  . Attends Religious Services: More than 4 times per year  . Active Member of Clubs or Organizations: Yes  . Attends Archivist Meetings: More than 4 times per year  . Marital Status: Married     Family History: The patient's family history includes Coronary artery disease in her mother; Diabetes in her brother; Glaucoma in her mother; Hypertension in her maternal grandfather and mother; Stroke in her father; Thyroid disease in her mother. There is no history of Breast cancer.  ROS:   Please see the history of present illness.     All other systems reviewed and are negative.  EKGs/Labs/Other Studies Reviewed:    The following studies were reviewed today:   EKG:  EKG is  ordered today.  EKG showed sinus bradycardia, heart rate 48.  Recent Labs: 12/06/2019: TSH 2.890 05/02/2020: ALT 10; BUN 26; Creatinine, Ser 1.15; Hemoglobin 10.5; Platelets 230; Potassium 4.4; Sodium 144  Recent Lipid Panel    Component Value Date/Time   CHOL 131 12/06/2019 1601   CHOL 147 07/04/2015 0820   TRIG 78 12/06/2019 1601   TRIG 89 07/04/2015 0820   HDL 58 12/06/2019 1601   VLDL 18 07/04/2015 0820   LDLCALC 58 12/06/2019 1601    Physical Exam:    VS:  BP 116/60 (BP Location: Left Arm, Patient Position: Sitting, Cuff Size: Normal)   Pulse (!) 56   Ht $R'5\' 2"'Ir$  (1.575 m)   Wt 161 lb (73 kg)   LMP  (LMP Unknown)   SpO2 97%   BMI 29.45 kg/m     Wt Readings from Last 3 Encounters:  05/04/20 161 lb (73 kg)  05/02/20 162 lb (73.5 kg)  04/03/20 162 lb 8 oz (73.7 kg)       GEN:  Well nourished, well developed in no acute distress HEENT: Normal NECK: No JVD; No carotid bruits LYMPHATICS: No lymphadenopathy CARDIAC: Regular, bradycardic, no murmurs, rubs, gallops RESPIRATORY:  Clear to auscultation without rales, wheezing or rhonchi  ABDOMEN: Soft, non-tender, non-distended MUSCULOSKELETAL:  No edema; No deformity  SKIN: Warm and dry NEUROLOGIC:  Alert and oriented x 3 PSYCHIATRIC:  Normal affect   ASSESSMENT:    1. NSVT (nonsustained ventricular tachycardia) ([place])   2. Diastolic  dysfunction   3. Pulmonary HTN ([place])   4. Essential hypertension   5. Pure hypercholesterolemia    PLAN:    In order of problems listed above:  1. Previous abnormal heartbeat noted on exam, history of CVA. Cardiac monitor x2 weeks on 02/21/2020 with no evidence for A. fib or flutter. 6 beat run nonsustained VT noted. Occasional paroxysmal SVT noted.  Echo on 04/26/2020 showed normal systolic function, grade 2 diastolic dysfunction, moderately elevated PA pressures, 46.65mmHg.  No need for additional medications.  Continue to monitor as patient is asymptomatic. 2. Grade 2 diastolic dysfunction.  No edema noted on exam, clinically asymptomatic.  Monitor and consider diuretics if patient becomes volume overloaded. 3. Moderate pulmonary hypertension on echo.  Possibly from diastolic dysfunction/WHO group 2?  Patient clinically asymptomatic.  Monitor for now. 4. History of hypertension.  BP well controlled.  Continue lisinopril, hctz. 5. History of hyperlipidemia, total and LDL cholesterol well controlled.  Continue Crestor.  Follow-up in 6 months  Total encounter time 40 minutes  Greater than 50% was spent in counseling and coordination of care with the patient   This note was generated in part or whole with voice recognition software. Voice recognition is usually quite accurate but there are transcription errors that can and very often do occur. I apologize for any  typographical errors that were not detected and corrected.  Medication Adjustments/Labs and Tests Ordered: Current medicines are reviewed at length with the patient today.  Concerns regarding medicines are outlined above.  Orders Placed This Encounter  Procedures  . EKG 12-Lead   No orders of the defined types were placed in this encounter.   Patient Instructions  Medication Instructions:  Your physician recommends that you continue on your current medications as directed. Please refer to the Current Medication list given to you today.  *If you need a refill on your cardiac medications before your next appointment, please call your pharmacy*   Lab Work: None Ordered If you have labs (blood work) drawn today and your tests are completely normal, you will receive your results only by: Marland Kitchen MyChart Message (if you have MyChart) OR . A paper copy in the mail If you have any lab test that is abnormal or we need to change your treatment, we will call you to review the results.   Testing/Procedures: None Ordered   Follow-Up: At [facility], you and your health needs are our priority.  As part of our continuing mission to provide you with exceptional heart care, we have created designated Provider Care Teams.  These Care Teams include your primary Cardiologist (physician) and Advanced Practice Providers (APPs -  Physician Assistants and Nurse Practitioners) who all work together to provide you with the care you need, when you need it.  We recommend signing up for the patient portal called "MyChart".  Sign up information is provided on this After Visit Summary.  MyChart is used to connect with patients for Virtual Visits (Telemedicine).  Patients are able to view lab/test results, encounter notes, upcoming appointments, etc.  Non-urgent messages can be sent to your provider as well.   To learn more about what you can do with MyChart, go to [email or web address].    Your next appointment:    6 month(s)  The format for your next appointment:   In Person  Provider:   Kate Sable, MD   Other Instructions      Signed, Kate Sable, MD  05/04/2020 12:40 PM    [facility] 

## 2020-05-04 NOTE — Patient Instructions (Signed)

## 2020-05-04 NOTE — Telephone Encounter (Signed)
Pt has been r/s please call from [place] next time

## 2020-05-08 ENCOUNTER — Telehealth: Payer: Self-pay | Admitting: Family Medicine

## 2020-05-08 NOTE — Telephone Encounter (Signed)
Routing to provider to advise. See message below.

## 2020-05-08 NOTE — Telephone Encounter (Signed)
Called and LVM asking for patient to please return my call.  

## 2020-05-08 NOTE — Telephone Encounter (Signed)
Patient is calling to request another clobetasol ointment (TEMOVATE) 0.05 % [[id number]] Patient has misplaced the medication. Patient has used [facility]. [place], [place].

## 2020-05-08 NOTE — Telephone Encounter (Signed)
See request from patient.  This medication was just provided on 05-02-2020 / Will send to office for approval. Outpatient Medication Detail   Disp Refills Start End   clobetasol ointment (TEMOVATE) 0.05 % 30 g 3 05/02/2020    Sig - Route: Apply topically 2 (two) times daily. - Topical   Sent to pharmacy as: clobetasol ointment (TEMOVATE) 0.05 %   Notes to Pharmacy: Refill [id number]   E-Prescribing Status: Receipt confirmed by pharmacy (05/02/2020 4:15 PM EDT)

## 2020-05-08 NOTE — Telephone Encounter (Signed)
Can we find out if something happened to her last container- I'm a bit concerned if she's just going through it that fast

## 2020-05-09 MED ORDER — CLOBETASOL PROPIONATE 0.05 % EX OINT
TOPICAL_OINTMENT | Freq: Two times a day (BID) | CUTANEOUS | 3 refills | Status: DC
Start: 2020-05-09 — End: 2020-09-22

## 2020-05-09 NOTE — Telephone Encounter (Signed)
Called and spoke to patient. She states she has not been over using the ointment. She states that she has misplaced it and not sure what happened to it. She states she had a hectic day last week and think it may have accidentally got thrown into the trash. Would like new RX sent to [facility]

## 2020-05-18 ENCOUNTER — Telehealth (INDEPENDENT_AMBULATORY_CARE_PROVIDER_SITE_OTHER): Payer: Self-pay | Admitting: Gastroenterology

## 2020-05-18 ENCOUNTER — Other Ambulatory Visit (INDEPENDENT_AMBULATORY_CARE_PROVIDER_SITE_OTHER): Payer: Self-pay

## 2020-05-18 ENCOUNTER — Other Ambulatory Visit: Payer: Self-pay

## 2020-05-18 DIAGNOSIS — Z86010 Personal history of colonic polyps: Secondary | ICD-10-CM

## 2020-05-18 MED ORDER — NA SULFATE-K SULFATE-MG SULF 17.5-3.13-1.6 GM/177ML PO SOLN: 1.0000 | Freq: Once | ORAL | 0 refills | Status: AC

## 2020-05-18 NOTE — Progress Notes (Signed)
Gastroenterology Pre-Procedure Review  Request Date: 06/13/20  Requesting Physician: Dr. Vicente Males  PATIENT REVIEW QUESTIONS: The patient responded to the following health history questions as indicated:    1. Are you having any GI issues? Patient is unsure of any GI issues. Patient stated she has been experiencing a lot of diarrhea lately. 2. Do you have a personal history of Polyps? yes (04/01/2014 done at [facility].) 3. Do you have a family history of Colon Cancer or Polyps? no 4. Diabetes Mellitus? yes ( Type II) 5. Joint replacements in the past 12 months?no 6. Major health problems in the past 3 months?no 7. Any artificial heart valves, MVP, or defibrillator?no    MEDICATIONS & ALLERGIES:    Patient reports the following regarding taking any anticoagulation/antiplatelet therapy:   Plavix, Coumadin, Eliquis, Xarelto, Lovenox, Pradaxa, Brilinta, or Effient? Yes, Plavix 75 mg. Aspirin? yes (Aspirin 81 mg daily.)  Patient confirms/reports the following medications:  Current Outpatient Medications  Medication Sig Dispense Refill  . ASPIRIN 81 PO Take by mouth.    . blood glucose meter kit and supplies Dispense based on patient and insurance preference. Use up to four times daily as directed. (FOR ICD-9 250.00, 250.01). 1 each 0  . clobetasol ointment (TEMOVATE) 0.05 % Apply topically 2 (two) times daily. 30 g 3  . clopidogrel (PLAVIX) 75 MG tablet Take 1 tablet (75 mg total) by mouth daily. 90 tablet 3  . ferrous sulfate 325 (65 FE) MG tablet Take 1 tablet (325 mg total) by mouth 2 (two) times daily with a meal. (Patient taking differently: Take 325 mg by mouth daily with breakfast. ) 759 tablet 3  . folic acid (FOLVITE) 163 MCG tablet Take 400 mcg by mouth daily.    Marland Kitchen glucose blood test strip 1 each by Other route 2 (two) times daily. Use as instructed, Dx: E11.22 100 each 12  . hydrochlorothiazide (HYDRODIURIL) 25 MG tablet Take 1 tablet (25 mg total) by mouth daily. 90 tablet 1  .  Lancets MISC 1 each by Does not apply route daily. 100 each 12  . levothyroxine (SYNTHROID) 50 MCG tablet Take 1 tablet (50 mcg total) by mouth daily. 90 tablet 3  . lisinopril (ZESTRIL) 20 MG tablet Take 1 tablet (20 mg total) by mouth daily. 90 tablet 1  . metFORMIN (GLUCOPHAGE-XR) 500 MG 24 hr tablet TAKE 4 TABLETS(2,$RemoveBeforeDE'000MG'yNRpDfrpJiKpwoW$      TOTAL) DAILY WITH BREAKFAST 360 tablet 1  . rosuvastatin (CRESTOR) 10 MG tablet Take 1 tablet (10 mg total) by mouth daily. 90 tablet 1  . cyanocobalamin 100 MCG tablet Take 100 mcg by mouth daily.    . fluticasone (FLONASE) 50 MCG/ACT nasal spray Place 1 spray into both nostrils 2 (two) times daily. (Patient not taking: Reported on 05/18/2020)    . Na Sulfate-K Sulfate-Mg Sulf 17.5-3.13-1.6 GM/177ML SOLN Take 1 kit by mouth once for 1 dose. 354 mL 0   No current facility-administered medications for this visit.    Patient confirms/reports the following allergies:  Allergies  Allergen Reactions  . Atorvastatin     cramps  . Evista [Raloxifene] Nausea Only    No orders of the defined types were placed in this encounter.   AUTHORIZATION INFORMATION Primary Insurance: 1D#: Group #:  Secondary Insurance: 1D#: Group #:  SCHEDULE INFORMATION: Date: 06/13/2020 Time: Location:[facility]

## 2020-05-19 ENCOUNTER — Telehealth: Payer: Self-pay | Admitting: Cardiology

## 2020-05-19 NOTE — Telephone Encounter (Signed)
   [facility] Pre-operative Risk Assessment    HEARTCARE STAFF: - Please ensure there is not already an duplicate clearance open for this procedure. - Under Visit Info/Reason for Call, type in Other and utilize the format Clearance MM/DD/YY or Clearance TBD. Do not use dashes or single digits. - If request is for dental extraction, please clarify the # of teeth to be extracted.  Request for surgical clearance:  1. What type of surgery is being performed? colonoscopy   2. When is this surgery scheduled? 06-13-20   3. What type of clearance is required (medical clearance vs. Pharmacy clearance to hold med vs. Both)?  both  4. Are there any medications that need to be held prior to surgery and how long? plavix please advise   5. Practice name and name of physician performing surgery? [facility]   6. What is the office phone number? [phone]   7.   What is the office fax number? [phone]  8.   Anesthesia type (None, local, MAC, general) ? Not noted    Clarisse Gouge 05/19/2020, 3:00 PM  _________________________________________________________________   (provider comments below)

## 2020-05-19 NOTE — Telephone Encounter (Signed)
   Primary Cardiologist: Kate Sable, MD  Chart reviewed as part of pre-operative protocol coverage. The patient was doing well on cardiac stand point when last seen by Kate Sable, MD.   Given past medical history and time since last visit, based on ACC/AHA guidelines, Teresa [name] would be at acceptable risk for the planned procedure without further cardiovascular testing.   The patient takes Plavix for hx of CVA which need to be cleared by PCP or neurologist.  I will route this recommendation to the requesting party via [place] fax function and remove from pre-op pool.  Please call with questions.  [place], [place] 05/19/2020, 4:01 PM

## 2020-05-22 ENCOUNTER — Other Ambulatory Visit: Payer: Self-pay | Admitting: Family Medicine

## 2020-05-22 NOTE — Telephone Encounter (Signed)
Requested Prescriptions  Pending Prescriptions Disp Refills  . rosuvastatin (CRESTOR) 10 MG tablet [Pharmacy Med Name: ROSUVASTATIN TAB 10MG ] 90 tablet 1    Sig: TAKE 1 TABLET DAILY     Cardiovascular:  Antilipid - Statins Failed - 05/22/2020  8:24 AM      Failed - LDL in normal range and within 360 days    LDL Chol Calc (NIH)  Date Value Ref Range Status  12/06/2019 58 0 - 99 mg/dL Final         Passed - Total Cholesterol in normal range and within 360 days    Cholesterol, Total  Date Value Ref Range Status  12/06/2019 131 100 - 199 mg/dL Final   Cholesterol Piccolo, Waived  Date Value Ref Range Status  07/04/2015 147 <200 mg/dL Final    Comment:                            Desirable                <200                         Borderline High      200- 239                         High                     >239          Passed - HDL in normal range and within 360 days    HDL  Date Value Ref Range Status  12/06/2019 58 >39 mg/dL Final         Passed - Triglycerides in normal range and within 360 days    Triglycerides  Date Value Ref Range Status  12/06/2019 78 0 - 149 mg/dL Final   Triglycerides Piccolo,Waived  Date Value Ref Range Status  07/04/2015 89 <150 mg/dL Final    Comment:                            Normal                   <150                         Borderline High     150 - 199                         High                200 - 499                         Very High                >499          Passed - Patient is not pregnant      Passed - Valid encounter within last 12 months    Recent Outpatient Visits          2 weeks ago Diarrhea, unspecified type   [place], [place], DO   3 months ago Irregular heartbeat   [facility] [place], [place]   3 months ago 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   In 4 months Agbor-Etang, Aaron Edelman, MD [facility], [facility]   In 9 months  [employer], [place]           . lisinopril (ZESTRIL) 20 MG tablet [Pharmacy Med Name: LISINOPRIL TAB 20MG ] 90 tablet 1    Sig: TAKE 1 TABLET DAILY     Cardiovascular:  ACE Inhibitors Failed - 05/22/2020  8:24 AM      Failed - Cr in normal range and within 180 days    Creatinine, Ser  Date Value Ref Range Status  05/02/2020 1.15 (H) 0.57 - 1.00 mg/dL Final         Passed - K in normal range and within 180 days    Potassium  Date Value Ref Range Status  05/02/2020 4.4 3.5 - 5.2 mmol/L Final         Passed - Patient is not pregnant      Passed - Last BP in normal range    BP Readings from Last 1 Encounters:  05/04/20 116/60         Passed - Valid encounter within last 6 months    Recent Outpatient Visits          2 weeks ago Diarrhea, unspecified type   [facility] [place], Megan P, DO   3 months ago Irregular heartbeat   [facility] [place] [place], [place]   3 months ago 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   [facility] He[place], Megan P, DO   4 months ago 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

## 2020-05-23 ENCOUNTER — Ambulatory Visit (INDEPENDENT_AMBULATORY_CARE_PROVIDER_SITE_OTHER): Payer: Medicare HMO | Admitting: Family Medicine

## 2020-05-23 ENCOUNTER — Encounter: Payer: Self-pay | Admitting: Family Medicine

## 2020-05-23 ENCOUNTER — Other Ambulatory Visit: Payer: Self-pay

## 2020-05-23 VITALS — BP 152/66 | HR 53 | Temp 98.7°F | Ht 62.0 in | Wt 167.0 lb

## 2020-05-23 DIAGNOSIS — D692 Other nonthrombocytopenic purpura: Secondary | ICD-10-CM

## 2020-05-23 DIAGNOSIS — E1122 Type 2 diabetes mellitus with diabetic chronic kidney disease: Secondary | ICD-10-CM | POA: Diagnosis not present

## 2020-05-23 DIAGNOSIS — I129 Hypertensive chronic kidney disease with stage 1 through stage 4 chronic kidney disease, or unspecified chronic kidney disease: Secondary | ICD-10-CM

## 2020-05-23 DIAGNOSIS — E782 Mixed hyperlipidemia: Secondary | ICD-10-CM | POA: Diagnosis not present

## 2020-05-23 DIAGNOSIS — Z0000 Encounter for general adult medical examination without abnormal findings: Secondary | ICD-10-CM

## 2020-05-23 DIAGNOSIS — E039 Hypothyroidism, unspecified: Secondary | ICD-10-CM

## 2020-05-23 DIAGNOSIS — N1831 Chronic kidney disease, stage 3a: Secondary | ICD-10-CM

## 2020-05-23 LAB — MICROALBUMIN, URINE WAIVED
Creatinine, Urine Waived: 100 mg/dL (ref 10–300)
Microalb, Ur Waived: 150 mg/L — ABNORMAL HIGH (ref 0–19)

## 2020-05-23 LAB — URINALYSIS, ROUTINE W REFLEX MICROSCOPIC
Bilirubin, UA: NEGATIVE
Glucose, UA: NEGATIVE
Ketones, UA: NEGATIVE
Leukocytes,UA: NEGATIVE
Nitrite, UA: NEGATIVE
RBC, UA: NEGATIVE
Specific Gravity, UA: 1.025 (ref 1.005–1.030)
Urobilinogen, Ur: 0.2 mg/dL (ref 0.2–1.0)
pH, UA: 5 (ref 5.0–7.5)

## 2020-05-23 LAB — MICROSCOPIC EXAMINATION
Bacteria, UA: NONE SEEN
RBC, Urine: NONE SEEN /hpf (ref 0–2)

## 2020-05-23 LAB — BAYER DCA HB A1C WAIVED: HB A1C (BAYER DCA - WAIVED): 6.1 % (ref <7.0)

## 2020-05-23 MED ORDER — METFORMIN HCL ER 500 MG PO TB24
ORAL_TABLET | ORAL | 1 refills | Status: DC
Start: 2020-05-23 — End: 2020-08-01

## 2020-05-23 MED ORDER — HYDROCHLOROTHIAZIDE 25 MG PO TABS
25.0000 mg | ORAL_TABLET | Freq: Every day | ORAL | 1 refills | Status: DC
Start: 2020-05-23 — End: 2020-08-30

## 2020-05-23 NOTE — Patient Instructions (Signed)
Health Maintenance After Age 74 After age 74, you are at a higher risk for certain long-term diseases and infections as well as injuries from falls. Falls are a major cause of broken bones and head injuries in people who are older than age 74. Getting regular preventive care can help to keep you healthy and well. Preventive care includes getting regular testing and making lifestyle changes as recommended by your health care provider. Talk with your health care provider about:  Which screenings and tests you should have. A screening is a test that checks for a disease when you have no symptoms.  A diet and exercise plan that is right for you. What should I know about screenings and tests to prevent falls? Screening and testing are the best ways to find a health problem early. Early diagnosis and treatment give you the best chance of managing medical conditions that are common after age 74. Certain conditions and lifestyle choices may make you more likely to have a fall. Your health care provider may recommend:  Regular vision checks. Poor vision and conditions such as cataracts can make you more likely to have a fall. If you wear glasses, make sure to get your prescription updated if your vision changes.  Medicine review. Work with your health care provider to regularly review all of the medicines you are taking, including over-the-counter medicines. Ask your health care provider about any side effects that may make you more likely to have a fall. Tell your health care provider if any medicines that you take make you feel dizzy or sleepy.  Osteoporosis screening. Osteoporosis is a condition that causes the bones to get weaker. This can make the bones weak and cause them to break more easily.  Blood pressure screening. Blood pressure changes and medicines to control blood pressure can make you feel dizzy.  Strength and balance checks. Your health care provider may recommend certain tests to check your  strength and balance while standing, walking, or changing positions.  Foot health exam. Foot pain and numbness, as well as not wearing proper footwear, can make you more likely to have a fall.  Depression screening. You may be more likely to have a fall if you have a fear of falling, feel emotionally low, or feel unable to do activities that you used to do.  Alcohol use screening. Using too much alcohol can affect your balance and may make you more likely to have a fall. What actions can I take to lower my risk of falls? General instructions  Talk with your health care provider about your risks for falling. Tell your health care provider if: ? You fall. Be sure to tell your health care provider about all falls, even ones that seem minor. ? You feel dizzy, sleepy, or off-balance.  Take over-the-counter and prescription medicines only as told by your health care provider. These include any supplements.  Eat a healthy diet and maintain a healthy weight. A healthy diet includes low-fat dairy products, low-fat (lean) meats, and fiber from whole grains, beans, and lots of fruits and vegetables. Home safety  Remove any tripping hazards, such as rugs, cords, and clutter.  Install safety equipment such as grab bars in bathrooms and safety rails on stairs.  Keep rooms and walkways well-lit. Activity   Follow a regular exercise program to stay fit. This will help you maintain your balance. Ask your health care provider what types of exercise are appropriate for you.  If you need a cane or   walker, use it as recommended by your health care provider.  Wear supportive shoes that have nonskid soles. Lifestyle  Do not drink alcohol if your health care provider tells you not to drink.  If you drink alcohol, limit how much you have: ? 0-1 drink a day for women. ? 0-2 drinks a day for men.  Be aware of how much alcohol is in your drink. In the [place] one drink equals one typical bottle of beer (12  oz), one-half glass of wine (5 oz), or one shot of hard liquor (1 oz).  Do not use any products that contain nicotine or tobacco, such as cigarettes and e-cigarettes. If you need help quitting, ask your health care provider. Summary  Having a healthy lifestyle and getting preventive care can help to protect your health and wellness after age 74.  Screening and testing are the best way to find a health problem early and help you avoid having a fall. Early diagnosis and treatment give you the best chance for managing medical conditions that are more common for people who are older than age 74.  Falls are a major cause of broken bones and head injuries in people who are older than age 74. Take precautions to prevent a fall at home.  Work with your health care provider to learn what changes you can make to improve your health and wellness and to prevent falls. This information is not intended to replace advice given to you by your health care provider. Make sure you discuss any questions you have with your health care provider. Document Revised: 11/19/2018 Document Reviewed: 06/11/2017 Elsevier Patient Education  2020 Elsevier Inc.  

## 2020-05-23 NOTE — Progress Notes (Signed)
BP (!) 152/66   Pulse (!) 53   Temp 98.7 F (37.1 C) (Oral)   Ht 5' 2" (1.575 m)   Wt 167 lb (75.8 kg)   LMP  (LMP Unknown)   SpO2 100%   BMI 30.54 kg/m    Subjective:    Patient ID: Teresa [name], female    DOB: 1946-04-08, 74 y.o.   MRN: [id number]  HPI: Teresa [name] is a 74 y.o. female presenting on 05/23/2020 for comprehensive medical examination. Current medical complaints include:  Diarrhea is better than it was, but still acting up.  HYPERTENSION / HYPERLIPIDEMIA Satisfied with current treatment? yes Duration of hypertension: chronic BP monitoring frequency: not checking BP medication side effects: no Past BP meds: lisinopril, HCTZ Duration of hyperlipidemia: chronic Cholesterol medication side effects: no Cholesterol supplements: none Past cholesterol medications: crestor Medication compliance: excellent compliance Aspirin: yes Recent stressors: no Recurrent headaches: no Visual changes: no Palpitations: no Dyspnea: no Chest pain: no Lower extremity edema: no Dizzy/lightheaded: no  DIABETES Hypoglycemic episodes:yes Polydipsia/polyuria: no Visual disturbance: no Chest pain: no Paresthesias: no Glucose Monitoring: yes  Accucheck frequency: Daily  Fasting glucose: 110s Taking Insulin?: no Blood Pressure Monitoring: not checking Retinal Examination: Up to Date Foot Exam: done today Diabetic Education: Completed Pneumovax: Up to Date Influenza: Up to Date Aspirin: yes  HYPOTHYROIDISM Thyroid control status:controlled Satisfied with current treatment? yes Medication side effects: no Medication compliance: excellent compliance Recent dose adjustment:no Fatigue: no Cold intolerance: no Heat intolerance: no Weight gain: no Weight loss: no Constipation: no Diarrhea/loose stools: yes Palpitations: no Lower extremity edema: no Anxiety/depressed mood: no  Menopausal Symptoms: no  Depression Screen done today and results listed  below:  Depression screen [facility] 2/9 05/23/2020 03/10/2020 11/16/2019 05/20/2019 02/17/2019  Decreased Interest 0 1 0 0 0  Down, Depressed, Hopeless 0 0 0 1 1  PHQ - 2 Score 0 1 0 1 1  Altered sleeping 0 - - 0 -  Tired, decreased energy 0 - - 1 -  Change in appetite 0 - - 0 -  Feeling bad or failure about yourself  0 - - 0 -  Trouble concentrating 0 - - 1 -  Moving slowly or fidgety/restless 0 - - 0 -  Suicidal thoughts 0 - - 0 -  PHQ-9 Score 0 - - 3 -  Difficult doing work/chores Not difficult at all - - Not difficult at all -    Past Medical History:  Past Medical History:  Diagnosis Date  . Diabetes mellitus without complication ([place])   . Heart murmur   . Hyperlipidemia   . Hypertension   . Lichen sclerosus   . Obesity   . Osteopenia   . Stroke ([place])   . Thyroid disease     Surgical History:  Past Surgical History:  Procedure Laterality Date  . ROTATOR CUFF REPAIR Left   . 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 ASPIRIN 81 PO Take by mouth.  . blood glucose meter kit and supplies Dispense based on patient and insurance preference. Use up to four times daily as directed. (FOR ICD-9 250.00, 250.01).  . clobetasol ointment (TEMOVATE) 0.05 % Apply topically 2 (two) times daily.  . clopidogrel (PLAVIX) 75 MG tablet Take 1 tablet (75 mg total) by mouth daily.  . cyanocobalamin 100 MCG tablet Take 100 mcg by mouth daily.  . ferrous sulfate 325 (  65 FE) MG tablet Take 1 tablet (325 mg total) by mouth 2 (two) times daily with a meal. (Patient taking differently: Take 325 mg by mouth daily with breakfast. )  . folic acid (FOLVITE) 628 MCG tablet Take 400 mcg by mouth daily.  Marland Kitchen glucose blood test strip 1 each by Other route 2 (two) times daily. 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 Lancets MISC 1 each by Does not apply route daily.  Marland Kitchen levothyroxine (SYNTHROID) 50 MCG tablet Take 1 tablet (50 mcg total) by mouth  daily.  Marland Kitchen lisinopril (ZESTRIL) 20 MG tablet TAKE 1 TABLET DAILY  . rosuvastatin (CRESTOR) 10 MG tablet TAKE 1 TABLET DAILY   No current facility-administered medications on file prior to visit.    Allergies:  Allergies  Allergen Reactions  . Atorvastatin     cramps  . Evista [Raloxifene] Nausea Only    Social History:  Social History   Socioeconomic History  . Marital status: Married    Spouse name: Not on file  . Number of children: Not on file  . Years of education: Not on file  . Highest education level: High school graduate  Occupational History  . Occupation: retired  Tobacco Use  . Smoking status: Former Smoker    Packs/day: 2.00    Types: Cigarettes    Quit date: 01/1965    Years since quitting: 55.4  . Smokeless tobacco: Never Used  Vaping Use  . Vaping Use: Never used  Substance and Sexual Activity  . Alcohol use: No    Alcohol/week: 0.0 standard drinks  . Drug use: No  . Sexual activity: Never  Other Topics Concern  . Not on file  Social History Narrative  . Not on file   Social Determinants of Health   Financial Resource Strain: Low Risk   . Difficulty of Paying Living Expenses: Not hard at all  Food Insecurity: No Food Insecurity  . Worried About Charity fundraiser in the Last Year: Never true  . Ran Out of Food in the Last Year: Never true  Transportation Needs: No Transportation Needs  . Lack of Transportation (Medical): No  . Lack of Transportation (Non-Medical): No  Physical Activity: Inactive  . Days of Exercise per Week: 0 days  . Minutes of Exercise per Session: 0 min  Stress: No Stress Concern Present  . Feeling of Stress : Not at all  Social Connections: Socially Integrated  . Frequency of Communication with Friends and Family: More than three times a week  . Frequency of Social Gatherings with Friends and Family: More than three times a week  . Attends Religious Services: More than 4 times per year  . Active Member of Clubs or  Organizations: Yes  . Attends Archivist Meetings: More than 4 times per year  . Marital Status: Married  Human resources officer Violence: Not At Risk  . Fear of Current or Ex-Partner: No  . Emotionally Abused: No  . Physically Abused: No  . Sexually Abused: No   Social History   Tobacco Use  Smoking Status Former Smoker  . Packs/day: 2.00  . Types: Cigarettes  . Quit date: 01/1965  . Years since quitting: 55.4  Smokeless Tobacco Never Used   Social History   Substance and Sexual Activity  Alcohol Use No  . Alcohol/week: 0.0 standard drinks    Family History:  Family History  Problem Relation Age of Onset  . Hypertension Mother   . Thyroid disease Mother   .  Glaucoma Mother   . Coronary artery disease Mother   . Stroke Father   . Diabetes Brother   . Hypertension Maternal Grandfather   . Breast cancer Neg Hx     Past medical history, surgical history, medications, allergies, family history and social history reviewed with patient today and changes made to appropriate areas of the chart.   Review of Systems  Constitutional: Negative.   HENT: Positive for hearing loss. Negative for congestion, ear discharge, ear pain, nosebleeds, sinus pain, sore throat and tinnitus.   Eyes: Negative.   Respiratory: Negative.  Negative for stridor.   Cardiovascular: Negative.   Gastrointestinal: Positive for diarrhea. Negative for abdominal pain, blood in stool, constipation, heartburn, melena, nausea and vomiting.  Genitourinary: Negative.   Musculoskeletal: Negative.   Skin: Negative.   Neurological: Positive for tingling. Negative for dizziness, tremors, sensory change, speech change, focal weakness, seizures, loss of consciousness, weakness and headaches.  Endo/Heme/Allergies: Negative for environmental allergies and polydipsia. Bruises/bleeds easily.  Psychiatric/Behavioral: Negative.     All other ROS negative except what is listed above and in the HPI.        Objective:    BP (!) 152/66   Pulse (!) 53   Temp 98.7 F (37.1 C) (Oral)   Ht 5' 2" (1.575 m)   Wt 167 lb (75.8 kg)   LMP  (LMP Unknown)   SpO2 100%   BMI 30.54 kg/m   Wt Readings from Last 3 Encounters:  05/23/20 167 lb (75.8 kg)  05/04/20 161 lb (73 kg)  05/02/20 162 lb (73.5 kg)    Physical Exam Vitals and nursing note reviewed.  Constitutional:      General: She is not in acute distress.    Appearance: Normal appearance. She is obese. She is not ill-appearing, toxic-appearing or diaphoretic.  HENT:     Head: Normocephalic and atraumatic.     Right Ear: Tympanic membrane, ear canal and external ear normal. There is no impacted cerumen.     Left Ear: Tympanic membrane, ear canal and external ear normal. There is no impacted cerumen.     Nose: Nose normal. No congestion or rhinorrhea.     Mouth/Throat:     Mouth: Mucous membranes are moist.     Pharynx: Oropharynx is clear. No oropharyngeal exudate or posterior oropharyngeal erythema.  Eyes:     General: No scleral icterus.       Right eye: No discharge.        Left eye: No discharge.     Extraocular Movements: Extraocular movements intact.     Conjunctiva/sclera: Conjunctivae normal.     Pupils: Pupils are equal, round, and reactive to light.  Neck:     Vascular: No carotid bruit.  Cardiovascular:     Rate and Rhythm: Normal rate and regular rhythm.     Pulses: Normal pulses.     Heart sounds: No murmur heard.  No friction rub. No gallop.   Pulmonary:     Effort: Pulmonary effort is normal. No respiratory distress.     Breath sounds: Normal breath sounds. No stridor. No wheezing, rhonchi or rales.  Chest:     Chest wall: No tenderness.  Abdominal:     General: Abdomen is flat. Bowel sounds are normal. There is no distension.     Palpations: Abdomen is soft. There is no mass.     Tenderness: There is no abdominal tenderness. There is no right CVA tenderness, left CVA tenderness, guarding or rebound.  Hernia: No hernia is present.  Genitourinary:    Comments: Breast and pelvic exams deferred with shared decision making Musculoskeletal:        General: No swelling, tenderness, deformity or signs of injury.     Cervical back: Normal range of motion and neck supple. No rigidity. No muscular tenderness.     Right lower leg: No edema.     Left lower leg: No edema.  Lymphadenopathy:     Cervical: No cervical adenopathy.  Skin:    General: Skin is warm and dry.     Capillary Refill: Capillary refill takes less than 2 seconds.     Coloration: Skin is not jaundiced or pale.     Findings: No bruising, erythema, lesion or rash.  Neurological:     General: No focal deficit present.     Mental Status: She is alert and oriented to person, place, and time. Mental status is at baseline.     Cranial Nerves: No cranial nerve deficit.     Sensory: No sensory deficit.     Motor: No weakness.     Coordination: Coordination normal.     Gait: Gait normal.     Deep Tendon Reflexes: Reflexes normal.  Psychiatric:        Mood and Affect: Mood normal.        Behavior: Behavior normal.        Thought Content: Thought content normal.        Judgment: Judgment normal.     Results for orders placed or performed in visit on 05/02/20  CBC with Differential/Platelet  Result Value Ref Range   WBC 7.1 3.4 - 10.8 x10E3/uL   RBC 3.45 (L) 3.77 - 5.28 x10E6/uL   Hemoglobin 10.5 (L) 11.1 - 15.9 g/dL   Hematocrit 32.1 (L) 34.0 - 46.6 %   MCV 93 79 - 97 fL   MCH 30.4 26.6 - 33.0 pg   MCHC 32.7 31 - 35 g/dL   RDW 13.0 11.7 - 15.4 %   Platelets 230 150 - 450 x10E3/uL   Neutrophils 51 Not Estab. %   Lymphs 37 Not Estab. %   Monocytes 10 Not Estab. %   Eos 1 Not Estab. %   Basos 1 Not Estab. %   Neutrophils Absolute 3.7 1 - 7 x10E3/uL   Lymphocytes Absolute 2.6 0 - 3 x10E3/uL   Monocytes Absolute 0.7 0 - 0 x10E3/uL   EOS (ABSOLUTE) 0.1 0.0 - 0.4 x10E3/uL   Basophils Absolute 0.0 0 - 0 x10E3/uL   Immature  Granulocytes 0 Not Estab. %   Immature Grans (Abs) 0.0 0.0 - 0.1 x10E3/uL  Comprehensive metabolic panel  Result Value Ref Range   Glucose 179 (H) 65 - 99 mg/dL   BUN 26 8 - 27 mg/dL   Creatinine, Ser 1.15 (H) 0.57 - 1.00 mg/dL   GFR calc non Af Amer 47 (L) >59 mL/min/1.73   GFR calc Af Amer 55 (L) >59 mL/min/1.73   BUN/Creatinine Ratio 23 12 - 28   Sodium 144 134 - 144 mmol/L   Potassium 4.4 3.5 - 5.2 mmol/L   Chloride 109 (H) 96 - 106 mmol/L   CO2 22 20 - 29 mmol/L   Calcium 9.3 8.7 - 10.3 mg/dL   Total Protein 6.7 6.0 - 8.5 g/dL   Albumin 4.5 3.7 - 4.7 g/dL   Globulin, Total 2.2 1.5 - 4.5 g/dL   Albumin/Globulin Ratio 2.0 1.2 - 2.2   Bilirubin Total 0.2 0.0 - 1.2  mg/dL   Alkaline Phosphatase 88 44 - 121 IU/L   AST 16 0 - 40 IU/L   ALT 10 0 - 32 IU/L      Assessment & Plan:   Problem List Items Addressed This Visit      Endocrine   Hypothyroidism    Rechecking labs today. Await results. Treat as needed.       Relevant Orders   CBC with Differential/Platelet   Comprehensive metabolic panel   TSH   Type 2 diabetes mellitus with stage 3 chronic kidney disease, without long-term current use of insulin (HCC)   Relevant Medications   metFORMIN (GLUCOPHAGE-XR) 500 MG 24 hr tablet   Other Relevant Orders   Bayer DCA Hb A1c Waived   CBC with Differential/Platelet   Comprehensive metabolic panel   Urinalysis, Routine w reflex microscopic     Genitourinary   Hypertensive CKD (chronic kidney disease)    Under good control on current regimen. Continue current regimen. Continue to monitor. Call with any concerns. Refills given. Labs drawn today.       Relevant Orders   CBC with Differential/Platelet   Comprehensive metabolic panel   Microalbumin, Urine Waived   CKD (chronic kidney disease), stage III ([place])    Rechecking labs today. Await results. Treat as needed.         Other   Hyperlipidemia    Under good control on current regimen. Continue current regimen.  Continue to monitor. Call with any concerns. Refills given. Labs drawn today.       Relevant Medications   hydrochlorothiazide (HYDRODIURIL) 25 MG tablet   Other Relevant Orders   CBC with Differential/Platelet   Comprehensive metabolic panel   Lipid Panel w/o Chol/HDL Ratio    Other Visit Diagnoses    Routine general medical examination at a health care facility    -  Primary   Vaccines up to date. Screening labs checked today. Mammogram and DEXA up to date. Colonoscopy scheduled. Continue diet and exercise. Call with any concerns.   Senile purpura ([place])       Reassured patient. Continue to monitor.    Relevant Medications   hydrochlorothiazide (HYDRODIURIL) 25 MG tablet       Follow up plan: Return in about 6 months (around 11/21/2020).   LABORATORY TESTING:  - Pap smear: not applicable  IMMUNIZATIONS:   - Tdap: Tetanus vaccination status reviewed: last tetanus booster within 10 years. - Influenza: Up to date - Pneumovax: Up to date - Prevnar: Up to date - COVID: Up to date  SCREENING: -Mammogram: Up to date  - Colonoscopy: Scheduled for next month  - Bone Density: Up to date   PATIENT COUNSELING:   Advised to take 1 mg of folate supplement per day if capable of pregnancy.   Sexuality: Discussed sexually transmitted diseases, partner selection, use of condoms, avoidance of unintended pregnancy  and contraceptive alternatives.   Advised to avoid cigarette smoking.  I discussed with the patient that most people either abstain from alcohol or drink within safe limits (<=14/week and <=4 drinks/occasion for males, <=7/weeks and <= 3 drinks/occasion for females) and that the risk for alcohol disorders and other health effects rises proportionally with the number of drinks per week and how often a drinker exceeds daily limits.  Discussed cessation/primary prevention of drug use and availability of treatment for abuse.   Diet: Encouraged to adjust caloric intake to  maintain  or achieve ideal body weight, to reduce intake of dietary saturated fat  and total fat, to limit sodium intake by avoiding high sodium foods and not adding table salt, and to maintain adequate dietary potassium and calcium preferably from fresh fruits, vegetables, and low-fat dairy products.    stressed the importance of regular exercise  Injury prevention: Discussed safety belts, safety helmets, smoke detector, smoking near bedding or upholstery.   Dental health: Discussed importance of regular tooth brushing, flossing, and dental visits.    NEXT PREVENTATIVE PHYSICAL DUE IN 1 YEAR. Return in about 6 months (around 11/21/2020).

## 2020-05-23 NOTE — Assessment & Plan Note (Signed)
Under good control on current regimen. Continue current regimen. Continue to monitor. Call with any concerns. Refills given. Labs drawn today.   

## 2020-05-23 NOTE — Assessment & Plan Note (Signed)
Rechecking labs today. Await results. Treat as needed.  °

## 2020-05-24 LAB — COMPREHENSIVE METABOLIC PANEL
ALT: 10 IU/L (ref 0–32)
AST: 9 IU/L (ref 0–40)
Albumin/Globulin Ratio: 2 (ref 1.2–2.2)
Albumin: 4.1 g/dL (ref 3.7–4.7)
Alkaline Phosphatase: 81 IU/L (ref 44–121)
BUN/Creatinine Ratio: 15 (ref 12–28)
BUN: 13 mg/dL (ref 8–27)
Bilirubin Total: 0.2 mg/dL (ref 0.0–1.2)
CO2: 25 mmol/L (ref 20–29)
Calcium: 9.1 mg/dL (ref 8.7–10.3)
Chloride: 109 mmol/L — ABNORMAL HIGH (ref 96–106)
Creatinine, Ser: 0.85 mg/dL (ref 0.57–1.00)
GFR calc Af Amer: 79 mL/min/{1.73_m2} (ref >59)
GFR calc non Af Amer: 68 mL/min/{1.73_m2} (ref >59)
Globulin, Total: 2.1 g/dL (ref 1.5–4.5)
Glucose: 155 mg/dL — ABNORMAL HIGH (ref 65–99)
Potassium: 4.2 mmol/L (ref 3.5–5.2)
Sodium: 144 mmol/L (ref 134–144)
Total Protein: 6.2 g/dL (ref 6.0–8.5)

## 2020-05-24 LAB — LIPID PANEL W/O CHOL/HDL RATIO
Cholesterol, Total: 112 mg/dL (ref 100–199)
HDL: 51 mg/dL (ref >39)
LDL Chol Calc (NIH): 49 mg/dL (ref 0–99)
Triglycerides: 51 mg/dL (ref 0–149)
VLDL Cholesterol Cal: 12 mg/dL (ref 5–40)

## 2020-05-24 LAB — CBC WITH DIFFERENTIAL/PLATELET
Basophils Absolute: 0 10*3/uL (ref 0.0–0.2)
Basos: 0 %
EOS (ABSOLUTE): 0.1 10*3/uL (ref 0.0–0.4)
Eos: 1 %
Hematocrit: 27.8 % — ABNORMAL LOW (ref 34.0–46.6)
Hemoglobin: 9.2 g/dL — ABNORMAL LOW (ref 11.1–15.9)
Immature Grans (Abs): 0 10*3/uL (ref 0.0–0.1)
Immature Granulocytes: 0 %
Lymphocytes Absolute: 1.5 10*3/uL (ref 0.7–3.1)
Lymphs: 21 %
MCH: 31.1 pg (ref 26.6–33.0)
MCHC: 33.1 g/dL (ref 31.5–35.7)
MCV: 94 fL (ref 79–97)
Monocytes Absolute: 0.5 10*3/uL (ref 0.1–0.9)
Monocytes: 7 %
Neutrophils Absolute: 5.1 10*3/uL (ref 1.4–7.0)
Neutrophils: 71 %
Platelets: 214 10*3/uL (ref 150–450)
RBC: 2.96 x10E6/uL — ABNORMAL LOW (ref 3.77–5.28)
RDW: 12.6 % (ref 11.7–15.4)
WBC: 7.3 10*3/uL (ref 3.4–10.8)

## 2020-05-24 LAB — TSH: TSH: 2.6 u[IU]/mL (ref 0.450–4.500)

## 2020-05-26 ENCOUNTER — Telehealth: Payer: Self-pay | Admitting: Cardiology

## 2020-05-26 NOTE — Telephone Encounter (Signed)
Called the requesting office and the office was closed for the day. I left a voice message asking for someone to give our office a cal back and to ask for the pre op callback pool to be given updated information on the patient's cardiac clearance

## 2020-05-26 NOTE — Telephone Encounter (Signed)
   [facility] Pre-operative Risk Assessment    HEARTCARE STAFF: - Please ensure there is not already an duplicate clearance open for this procedure. - Under Visit Info/Reason for Call, type in Other and utilize the format Clearance MM/DD/YY or Clearance TBD. Do not use dashes or single digits. - If request is for dental extraction, please clarify the # of teeth to be extracted.  Request for surgical clearance:  1. What type of surgery is being performed? colonoscopy  2. When is this surgery scheduled? 06/13/20  3. What type of clearance is required (medical clearance vs. Pharmacy clearance to hold med vs. Both)? pharm  4. Are there any medications that need to be held prior to surgery and how long? Advise on plavix  5. Practice name and name of physician performing surgery? [facility]   6. What is the office phone number? [phone]   7.   What is the office fax number? [phone] attn [facility]  8.   Anesthesia type (None, local, MAC, general) ? Not noted   Marykay Lex 05/26/2020, 9:32 AM  _________________________________________________________________   (provider comments below)

## 2020-05-26 NOTE — Telephone Encounter (Signed)
Please inform requesting provider that patient is likely on plavix due to h/o CVA, but not for cardiac reasons. I could only trace the plavix rx to 2015, however patient likely has been on the plavix even prior to that point. Will defer to PCP to clear the patient to come off of plavix prior to GI procedure.

## 2020-05-29 ENCOUNTER — Telehealth: Payer: Self-pay | Admitting: Family Medicine

## 2020-05-29 ENCOUNTER — Telehealth: Payer: Self-pay

## 2020-05-29 NOTE — Telephone Encounter (Signed)
Forwarded to requesting providers office via [facility] fax function

## 2020-05-29 NOTE — Telephone Encounter (Signed)
Please advise, pt had appt 05/23/20  Copied from [place] #[id number]. Topic: General - Other >> May 29, 2020  9:47 AM Keene Breath wrote: Reason for CRM: Patient called to inform the doctor or nurse that her BP has been elevated recently.  She would like some advice on whether she should make an appt to come in.  CB#[phone]

## 2020-05-29 NOTE — Telephone Encounter (Signed)
Medication Refill - Medication: lisinopril (ZESTRIL) 20 MG tablet     Preferred Pharmacy (with phone number or street name):  [facility] [place] [address] [facility] [address] [employer] [facility] Phone:  [phone]  Fax:  [phone]       Agent: Please be advised that RX refills may take up to 3 business days. We ask that you follow-up with your pharmacy.

## 2020-06-02 NOTE — Telephone Encounter (Signed)
OK to hold plavix for 7 days prior and restart 7 days after her procedure. Please let me know if I need to fill out an actual form- please let GI know

## 2020-06-02 NOTE — Telephone Encounter (Signed)
Tried calling [facility] at the number listed below. Office is currently closed, will try to call again on Monday.

## 2020-06-05 NOTE — Telephone Encounter (Signed)
Called and spoke to Jovon at [facility]. She is sending over the form to be filled out with this information. Will fill out and fax back once completed.

## 2020-06-07 ENCOUNTER — Other Ambulatory Visit: Payer: Self-pay

## 2020-06-07 MED ORDER — PEG 3350-KCL-NA BICARB-NACL 420 G PO SOLR: 4000.0000 mL | Freq: Once | ORAL | 0 refills | Status: AC

## 2020-06-07 NOTE — Telephone Encounter (Signed)
Form signed and faxed back yesterday afternoon.

## 2020-06-07 NOTE — Progress Notes (Signed)
Sent in another prep. Suprep was expensive.

## 2020-06-09 ENCOUNTER — Other Ambulatory Visit: Payer: Self-pay

## 2020-06-09 ENCOUNTER — Other Ambulatory Visit
Admission: RE | Admit: 2020-06-09 | Discharge: 2020-06-09 | Disposition: A | Discharge status: Home / Self Care | Payer: Medicare HMO | Source: Ambulatory Visit | Attending: Gastroenterology | Admitting: Gastroenterology

## 2020-06-09 DIAGNOSIS — Z01812 Encounter for preprocedural laboratory examination: Secondary | ICD-10-CM | POA: Diagnosis present

## 2020-06-09 DIAGNOSIS — Z20822 Contact with and (suspected) exposure to covid-19: Secondary | ICD-10-CM | POA: Diagnosis not present

## 2020-06-10 LAB — SARS CORONAVIRUS 2 (TAT 6-24 HRS): SARS Coronavirus 2: NEGATIVE

## 2020-06-12 ENCOUNTER — Encounter: Payer: Self-pay | Admitting: Gastroenterology

## 2020-06-12 NOTE — Telephone Encounter (Signed)
Called and spoke to patient. BP has been running about 160 to 180 in the AM. Comes down to about 150 after medication. Appointment scheduled for 06/21/20 to access BP in office.

## 2020-06-12 NOTE — Telephone Encounter (Signed)
Can we check on readings, if >140s/90s, she should have appt

## 2020-06-13 ENCOUNTER — Encounter: Payer: Self-pay | Admitting: Certified Registered"

## 2020-06-13 ENCOUNTER — Ambulatory Visit
Admission: RE | Admit: 2020-06-13 | Discharge: 2020-06-13 | Disposition: A | Discharge status: Home / Self Care | Payer: Medicare HMO | Attending: Gastroenterology | Admitting: Gastroenterology

## 2020-06-13 ENCOUNTER — Encounter
Admission: RE | Disposition: A | Discharge status: Home / Self Care | Payer: Self-pay | Source: Home / Self Care | Attending: Gastroenterology

## 2020-06-13 DIAGNOSIS — Z5309 Procedure and treatment not carried out because of other contraindication: Secondary | ICD-10-CM | POA: Insufficient documentation

## 2020-06-13 DIAGNOSIS — Z86010 Personal history of colonic polyps: Secondary | ICD-10-CM | POA: Diagnosis not present

## 2020-06-13 LAB — GLUCOSE, CAPILLARY: Glucose-Capillary: 90 mg/dL (ref 70–99)

## 2020-06-13 SURGERY — COLONOSCOPY WITH PROPOFOL
Anesthesia: General

## 2020-06-13 MED ORDER — PROPOFOL 500 MG/50ML IV EMUL
INTRAVENOUS | Status: AC
  Filled 2020-06-13: qty 50

## 2020-06-13 MED ORDER — SODIUM CHLORIDE 0.9 % IV SOLN: INTRAVENOUS | Status: DC

## 2020-06-13 NOTE — Progress Notes (Signed)
Patient did not stop Plavix in time for the procedure.

## 2020-06-21 ENCOUNTER — Other Ambulatory Visit: Payer: Self-pay

## 2020-06-21 ENCOUNTER — Encounter: Payer: Self-pay | Admitting: Family Medicine

## 2020-06-21 ENCOUNTER — Ambulatory Visit (INDEPENDENT_AMBULATORY_CARE_PROVIDER_SITE_OTHER): Payer: Medicare HMO | Admitting: Family Medicine

## 2020-06-21 ENCOUNTER — Telehealth: Payer: Self-pay

## 2020-06-21 DIAGNOSIS — N1831 Chronic kidney disease, stage 3a: Secondary | ICD-10-CM

## 2020-06-21 DIAGNOSIS — I129 Hypertensive chronic kidney disease with stage 1 through stage 4 chronic kidney disease, or unspecified chronic kidney disease: Secondary | ICD-10-CM | POA: Diagnosis not present

## 2020-06-21 MED ORDER — LISINOPRIL 30 MG PO TABS: 30.0000 mg | ORAL_TABLET | Freq: Every day | ORAL | 1 refills | Status: DC

## 2020-06-21 NOTE — Telephone Encounter (Signed)
-----   Message from Jonathon Bellows, MD sent at 06/21/2020  9:57 AM EST ----- Sharyn Lull could you help schedule please thank you  Kiran ----- Message ----- From: [name], DO Sent: 06/21/2020   9:51 AM EST To: Jonathon Bellows, MD  Teresa [name] would like to reschedule her colonoscopy for after the first of the year if you office would like to call her.

## 2020-06-21 NOTE — Telephone Encounter (Signed)
LVM for patient to call the office back in regards to rescheduling her colonoscopy with Dr. Vicente Males for first of the year. Will send her a mychart message as well.  Thanks,  [place], [place]

## 2020-06-21 NOTE — Progress Notes (Signed)
BP (!) 155/89   Pulse (!) 111   Temp 98.5 F (36.9 C) (Oral)   Ht 5' 0.98" (1.549 m)   Wt 164 lb 9.6 oz (74.7 kg)   LMP  (LMP Unknown)   SpO2 99%   BMI 31.12 kg/m    Subjective:    Patient ID: Teresa [name], female    DOB: April 04, 1946, 74 y.o.   MRN: [id number]  HPI: Teresa [name] is a 74 y.o. female  Chief Complaint  Patient presents with  . Hypertension    176/82 this morning at home before meds were taken   HYPERTENSION Hypertension status: exacerbated  Satisfied with current treatment? no Duration of hypertension: chronic BP monitoring frequency:  not checking BP range: 130s-150s/80s BP medication side effects:  no Medication compliance: excellent compliance Previous BP meds: lisinopril, HCTZ Aspirin: yes Recurrent headaches: no Visual changes: no Palpitations: no Dyspnea: no Chest pain: no Lower extremity edema: no Dizzy/lightheaded: no  Relevant past medical, surgical, family and social history reviewed and updated as indicated. Interim medical history since our last visit reviewed. Allergies and medications reviewed and updated.  Review of Systems  Constitutional: Negative.   Respiratory: Negative.   Cardiovascular: Negative.   Gastrointestinal: Negative.   Musculoskeletal: Negative.   Psychiatric/Behavioral: Negative.     Per HPI unless specifically indicated above     Objective:    BP (!) 155/89   Pulse (!) 111   Temp 98.5 F (36.9 C) (Oral)   Ht 5' 0.98" (1.549 m)   Wt 164 lb 9.6 oz (74.7 kg)   LMP  (LMP Unknown)   SpO2 99%   BMI 31.12 kg/m   Wt Readings from Last 3 Encounters:  06/21/20 164 lb 9.6 oz (74.7 kg)  06/13/20 160 lb (72.6 kg)  05/23/20 167 lb (75.8 kg)    Physical Exam Vitals and nursing note reviewed.  Constitutional:      General: She is not in acute distress.    Appearance: Normal appearance. She is not ill-appearing, toxic-appearing or diaphoretic.  HENT:     Head: Normocephalic and atraumatic.      Right Ear: External ear normal.     Left Ear: External ear normal.     Nose: Nose normal.     Mouth/Throat:     Mouth: Mucous membranes are moist.     Pharynx: Oropharynx is clear.  Eyes:     General: No scleral icterus.       Right eye: No discharge.        Left eye: No discharge.     Extraocular Movements: Extraocular movements intact.     Conjunctiva/sclera: Conjunctivae normal.     Pupils: Pupils are equal, round, and reactive to light.  Cardiovascular:     Rate and Rhythm: Normal rate and regular rhythm.     Pulses: Normal pulses.     Heart sounds: Normal heart sounds. No murmur heard.  No friction rub. No gallop.   Pulmonary:     Effort: Pulmonary effort is normal. No respiratory distress.     Breath sounds: Normal breath sounds. No stridor. No wheezing, rhonchi or rales.  Chest:     Chest wall: No tenderness.  Musculoskeletal:        General: Normal range of motion.     Cervical back: Normal range of motion and neck supple.  Skin:    General: Skin is warm and dry.     Capillary Refill: Capillary refill takes less than 2 seconds.  Coloration: Skin is not jaundiced or pale.     Findings: No bruising, erythema, lesion or rash.  Neurological:     General: No focal deficit present.     Mental Status: She is alert and oriented to person, place, and time. Mental status is at baseline.  Psychiatric:        Mood and Affect: Mood normal.        Behavior: Behavior normal.        Thought Content: Thought content normal.        Judgment: Judgment normal.     Results for orders placed or performed during the hospital encounter of 06/13/20  Glucose, capillary  Result Value Ref Range   Glucose-Capillary 90 70 - 99 mg/dL      Assessment & Plan:   Problem List Items Addressed This Visit      Genitourinary   Hypertensive CKD (chronic kidney disease)    Not under good control. Will increase her lisinopril to 30mg  and recheck 1 month. Call with any concerns.           Follow up plan: Return in about 4 weeks (around 07/19/2020).

## 2020-06-21 NOTE — Assessment & Plan Note (Signed)
Not under good control. Will increase her lisinopril to 30mg  and recheck 1 month. Call with any concerns.

## 2020-06-23 ENCOUNTER — Telehealth: Payer: Medicare HMO | Admitting: General Practice

## 2020-06-23 ENCOUNTER — Ambulatory Visit: Payer: Self-pay | Admitting: General Practice

## 2020-06-23 DIAGNOSIS — E1122 Type 2 diabetes mellitus with diabetic chronic kidney disease: Secondary | ICD-10-CM

## 2020-06-23 DIAGNOSIS — N1831 Chronic kidney disease, stage 3a: Secondary | ICD-10-CM

## 2020-06-23 DIAGNOSIS — I10 Essential (primary) hypertension: Secondary | ICD-10-CM

## 2020-06-23 DIAGNOSIS — I499 Cardiac arrhythmia, unspecified: Secondary | ICD-10-CM

## 2020-06-23 DIAGNOSIS — E782 Mixed hyperlipidemia: Secondary | ICD-10-CM

## 2020-06-23 NOTE — Patient Instructions (Signed)
Visit Information  Goals Addressed              This Visit's Progress   .  RNCM: Pt_ "I am concerned about a letter I got about my medication" (pt-stated)        [place] (see longtitudinal plan of care for additional care plan information)  Current Barriers:  . Chronic Disease Management support, education, and care coordination needs related to HTN, HLD, DMII, and CKD Stage 3  Clinical Goal(s) related to HTN, HLD, DMII, and CKD Stage 3 :  Over the next 120 days, patient will:  . Work with the care management team to address educational, disease management, and care coordination needs  . Begin or continue self health monitoring activities as directed today Measure and record cbg (blood glucose) 1 times daily, Measure and record blood pressure 2 times per week, and adhere to heart healthy/ADA diet . Call provider office for new or worsened signs and symptoms Blood glucose findings outside established parameters, Blood pressure findings outside established parameters, and New or worsened symptom related to CKD, HLD, and other chronic conditions . Call care management team with questions or concerns . Verbalize basic understanding of patient centered plan of care established today  Interventions related to HTN, HLD, DMII, and CKD Stage 3 :  . Evaluation of current treatment plans and patient's adherence to plan as established by provider.  The patient went to see a specialist last week for her back and they ended up sending her to the pcp because she had an irregular heart beat. The patient states she felt fine and had no issues. She was evaluated and had a sinus rhythm in the office. She has been referred to cardiology and will see them next week on 02-21-2020.  She said it was a little concerning to her but she feels fine. She says her heart rate normally runs in the 40-50 range. 06-23-2020: The patient was in the office to see the pcp this week and her blood pressure and HR were  elevated: 155/89 and 111.  The patient had changes in her medication and her Lisinopril was increased to 30 mg.  She has started this and states her blood pressure this am was 136/80 and her heart rate was 60.  She denies knowing when her heart rate is elevated.   . Assessed patient understanding of disease states.  The patient has a good understanding of her chronic  conditions. Denies any acute distress. The patient is compliant with the plan of care. 06-23-2020: The patient verbalized understanding of her chronic conditions. The patient was upset that she was not able to complete her colonoscopy last week. She did the prep but was not told to hold her plavix. Because she had not held her plavix she was not able to complete her colonoscopy.  She said in the past she had held her plavix but since it was not on the paperwork she thought maybe with changes in technology that it was okay and did not question it.  She said she will reschedule in the new year.  . Assessed patient's education and care coordination needs.  Education on talking to the cardiologist about her concerns and possible test they may want her to have to check her heart. 06-23-2020: Denies any new needs at this time.  . Provided disease specific education to patient: on the benefits of taking blood pressure and recording to bring to the provider visit. Will provide patient with log book  at her appointment coming up in May.  The patient is taking her blood pressures and recording. States that in the morning it is a little elevated but midday to night it is in the 813'G systolic. [id number]: Review of orthostatic hypotension and changing position slowly, especially since there had been changes in her medication. The patient states that she does notice it sometimes in the morning. Also educated the patient on calling the office form any worsening changes in condition. Review of rescheduling her colonoscopy for next year. The patient verbalized  understanding.  . Evaluation of dietary intake. The patient is compliant with a heart healthy/ADA diet.  Nash Dimmer with appropriate clinical care team members regarding patient needs: Referral to CCM pharmacist concerning a letter the patient received concerning Plavix. She doesn't understand why she is getting the letter since she has been on it so long and would like to discuss it with the pcp and/or pharmacist.  . Evaluation of upcoming appointments. The patient will follow up with Dr. Wynetta Emery on 07-19-2020 at 0920 am.   Patient Self Care Activities related to HTN, HLD, DMII, and CKD Stage 3 :  . Patient is unable to independently self-manage chronic health conditions  Please see past updates related to this goal by clicking on the "Past Updates" button in the selected goal         The patient verbalized understanding of instructions  Telephone follow up appointment with care management team member scheduled for: 08-25-2020 at 1 pm  [place], MSN, [facility] Mobile: [phone]

## 2020-06-23 NOTE — Chronic Care Management (AMB) (Signed)
Chronic Care Management   Follow Up Note   06/23/2020 Name: Teresa [name] MRN: [id number] DOB: 02/26/1946  Referred by: [name], DO Reason for referral : Chronic Care Management (RNCM Follow up for Chronic Disease Management and Care Coordination Needs)   Teresa [name] is a 74 y.o. year old female who is a primary care patient of [name], [name], DO. The CCM team was consulted for assistance with chronic disease management and care coordination needs.    Review of patient status, including review of consultants reports, relevant laboratory and other test results, and collaboration with appropriate care team members and the patient's provider was performed as part of comprehensive patient evaluation and provision of chronic care management services.    SDOH (Social Determinants of Health) assessments performed: Yes See Care Plan activities for detailed interventions related to [facility])     Outpatient Encounter Medications as of 06/23/2020  Medication Sig   ASPIRIN 81 PO Take by mouth.   blood glucose meter kit and supplies Dispense based on patient and insurance preference. Use up to four times daily as directed. (FOR ICD-9 250.00, 250.01).   clobetasol ointment (TEMOVATE) 0.05 % Apply topically 2 (two) times daily.   clopidogrel (PLAVIX) 75 MG tablet Take 1 tablet (75 mg total) by mouth daily.   cyanocobalamin 100 MCG tablet Take 100 mcg by mouth daily.   ferrous sulfate 325 (65 FE) MG tablet Take 1 tablet (325 mg total) by mouth 2 (two) times daily with a meal. (Patient taking differently: Take 325 mg by mouth daily with breakfast. )   folic acid (FOLVITE) 381 MCG tablet Take 400 mcg by mouth daily.   glucose blood test strip 1 each by Other route 2 (two) times daily. 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   Lancets MISC 1 each by Does not apply route daily.   levothyroxine  (SYNTHROID) 50 MCG tablet Take 1 tablet (50 mcg total) by mouth daily.   lisinopril (ZESTRIL) 30 MG tablet Take 1 tablet (30 mg total) by mouth daily.   metFORMIN (GLUCOPHAGE-XR) 500 MG 24 hr tablet TAKE 4 TABLETS(2,$RemoveBeforeDE'000MG'lqBcAlFtkIWUPZc$      TOTAL) DAILY WITH BREAKFAST   rosuvastatin (CRESTOR) 10 MG tablet TAKE 1 TABLET DAILY   No facility-administered encounter medications on file as of 06/23/2020.     Objective:   Goals Addressed              This Visit's Progress     RNCM: Pt_ "I am concerned about a letter I got about my medication" (pt-stated)        CARE PLAN ENTRY (see longtitudinal plan of care for additional care plan information)  Current Barriers:   Chronic Disease Management support, education, and care coordination needs related to HTN, HLD, DMII, and CKD Stage 3  Clinical Goal(s) related to HTN, HLD, DMII, and CKD Stage 3 :  Over the next 120 days, patient will:   Work with the care management team to address educational, disease management, and care coordination needs   Begin or continue self health monitoring activities as directed today Measure and record cbg (blood glucose) 1 times daily, Measure and record blood pressure 2 times per week, and adhere to heart healthy/ADA diet  Call provider office for new or worsened signs and symptoms Blood glucose findings outside established parameters, Blood pressure findings outside established parameters, and New or worsened symptom related to CKD,  HLD, and other chronic conditions  Call care management team with questions or concerns  Verbalize basic understanding of patient centered plan of care established today  Interventions related to HTN, HLD, DMII, and CKD Stage 3 :   Evaluation of current treatment plans and patient's adherence to plan as established by provider.  The patient went to see a specialist last week for her back and they ended up sending her to the pcp because she had an irregular heart beat. The patient states  she felt fine and had no issues. She was evaluated and had a sinus rhythm in the office. She has been referred to cardiology and will see them next week on 02-21-2020.  She said it was a little concerning to her but she feels fine. She says her heart rate normally runs in the 40-50 range. 06-23-2020: The patient was in the office to see the pcp this week and her blood pressure and HR were elevated: 155/89 and 111.  The patient had changes in her medication and her Lisinopril was increased to 30 mg.  She has started this and states her blood pressure this am was 136/80 and her heart rate was 60.  She denies knowing when her heart rate is elevated.    Assessed patient understanding of disease states.  The patient has a good understanding of her chronic  conditions. Denies any acute distress. The patient is compliant with the plan of care. 06-23-2020: The patient verbalized understanding of her chronic conditions. The patient was upset that she was not able to complete her colonoscopy last week. She did the prep but was not told to hold her plavix. Because she had not held her plavix she was not able to complete her colonoscopy.  She said in the past she had held her plavix but since it was not on the paperwork she thought maybe with changes in technology that it was okay and did not question it.  She said she will reschedule in the new year.   Assessed patient's education and care coordination needs.  Education on talking to the cardiologist about her concerns and possible test they may want her to have to check her heart. 06-23-2020: Denies any new needs at this time.   Provided disease specific education to patient: on the benefits of taking blood pressure and recording to bring to the provider visit. Will provide patient with log book at her appointment coming up in May.  The patient is taking her blood pressures and recording. States that in the morning it is a little elevated but midday to night it is in the  627'O systolic. [id number]: Review of orthostatic hypotension and changing position slowly, especially since there had been changes in her medication. The patient states that she does notice it sometimes in the morning. Also educated the patient on calling the office form any worsening changes in condition. Review of rescheduling her colonoscopy for next year. The patient verbalized understanding.   Evaluation of dietary intake. The patient is compliant with a heart healthy/ADA diet.   Collaborated with appropriate clinical care team members regarding patient needs: Referral to CCM pharmacist concerning a letter the patient received concerning Plavix. She doesn't understand why she is getting the letter since she has been on it so long and would like to discuss it with the pcp and/or pharmacist.   Evaluation of upcoming appointments. The patient will follow up with Dr. Wynetta Emery on 07-19-2020 at 0920 am.   Patient Self Care Activities related  to HTN, HLD, DMII, and CKD Stage 3 :   Patient is unable to independently self-manage chronic health conditions  Please see past updates related to this goal by clicking on the "Past Updates" button in the selected goal          Plan:   Telephone follow up appointment with care management team member scheduled for: 08-25-2020 at 0900 am   [place], MSN, [facility] Mobile: [phone]

## 2020-06-28 NOTE — Telephone Encounter (Signed)
Patient left a voicemail stating she is ready to scheduled her colonoscopy please call patient at [phone]

## 2020-07-05 ENCOUNTER — Telehealth: Payer: Self-pay

## 2020-07-05 NOTE — Telephone Encounter (Signed)
LVM returning patients call to schedule her colonoscopy with Dr. Vicente Males.  Asked her to call the office back to let us know what dates work for her.  Thanks,  [place], [place]

## 2020-07-11 ENCOUNTER — Other Ambulatory Visit: Payer: Self-pay

## 2020-07-11 DIAGNOSIS — Z86010 Personal history of colonic polyps: Secondary | ICD-10-CM

## 2020-07-18 ENCOUNTER — Other Ambulatory Visit: Payer: Self-pay | Admitting: Nurse Practitioner

## 2020-07-19 ENCOUNTER — Other Ambulatory Visit: Payer: Self-pay

## 2020-07-19 ENCOUNTER — Encounter: Payer: Self-pay | Admitting: Family Medicine

## 2020-07-19 ENCOUNTER — Ambulatory Visit (INDEPENDENT_AMBULATORY_CARE_PROVIDER_SITE_OTHER): Payer: Medicare HMO | Admitting: Family Medicine

## 2020-07-19 VITALS — BP 148/68 | HR 52 | Temp 97.8°F | Wt 164.4 lb

## 2020-07-19 DIAGNOSIS — N1831 Chronic kidney disease, stage 3a: Secondary | ICD-10-CM | POA: Diagnosis not present

## 2020-07-19 DIAGNOSIS — I129 Hypertensive chronic kidney disease with stage 1 through stage 4 chronic kidney disease, or unspecified chronic kidney disease: Secondary | ICD-10-CM

## 2020-07-19 MED ORDER — LISINOPRIL 40 MG PO TABS
40.0000 mg | ORAL_TABLET | Freq: Every day | ORAL | 1 refills | Status: DC
Start: 2020-07-19 — End: 2020-11-28

## 2020-07-19 NOTE — Progress Notes (Signed)
BP (!) 148/68   Pulse (!) 52   Temp 97.8 F (36.6 C)   Wt 164 lb 6.4 oz (74.6 kg)   LMP  (LMP Unknown)   SpO2 100%   BMI 31.08 kg/m    Subjective:    Patient ID: Teresa [name], female    DOB: [birth year], 74 y.o.   MRN: [id number]  HPI: Teresa [name] is a 74 y.o. female  Chief Complaint  Patient presents with  . hypertensive CKD   HYPERTENSION Hypertension status: stable  Satisfied with current treatment? no Duration of hypertension: chronic BP monitoring frequency:  not checking BP range: 150s-160s/70s-80s BP medication side effects:  no Medication compliance: excellent compliance Previous BP meds:lisinopril Aspirin: yes Recurrent headaches: no Visual changes: no Palpitations: no Dyspnea: no Chest pain: no Lower extremity edema: no Dizzy/lightheaded: no  Relevant past medical, surgical, family and social history reviewed and updated as indicated. Interim medical history since our last visit reviewed. Allergies and medications reviewed and updated.  Review of Systems  Constitutional: Negative.   Respiratory: Negative.   Cardiovascular: Negative.   Gastrointestinal: Negative.   Musculoskeletal: Negative.   Neurological: Negative.   Psychiatric/Behavioral: Negative.     Per HPI unless specifically indicated above     Objective:    BP (!) 148/68   Pulse (!) 52   Temp 97.8 F (36.6 C)   Wt 164 lb 6.4 oz (74.6 kg)   LMP  (LMP Unknown)   SpO2 100%   BMI 31.08 kg/m   Wt Readings from Last 3 Encounters:  07/19/20 164 lb 6.4 oz (74.6 kg)  06/21/20 164 lb 9.6 oz (74.7 kg)  06/13/20 160 lb (72.6 kg)    Physical Exam Vitals and nursing note reviewed.  Constitutional:      General: She is not in acute distress.    Appearance: Normal appearance. She is not ill-appearing, toxic-appearing or diaphoretic.  HENT:     Head: Normocephalic and atraumatic.     Right Ear: External ear normal.     Left Ear: External ear normal.     Nose: Nose normal.      Mouth/Throat:     Mouth: Mucous membranes are moist.     Pharynx: Oropharynx is clear.  Eyes:     General: No scleral icterus.       Right eye: No discharge.        Left eye: No discharge.     Extraocular Movements: Extraocular movements intact.     Conjunctiva/sclera: Conjunctivae normal.     Pupils: Pupils are equal, round, and reactive to light.  Cardiovascular:     Rate and Rhythm: Normal rate and regular rhythm.     Pulses: Normal pulses.     Heart sounds: Normal heart sounds. No murmur heard.  No friction rub. No gallop.   Pulmonary:     Effort: Pulmonary effort is normal. No respiratory distress.     Breath sounds: Normal breath sounds. No stridor. No wheezing, rhonchi or rales.  Chest:     Chest wall: No tenderness.  Musculoskeletal:        General: Normal range of motion.     Cervical back: Normal range of motion and neck supple.  Skin:    General: Skin is warm and dry.     Capillary Refill: Capillary refill takes less than 2 seconds.     Coloration: Skin is not jaundiced or pale.     Findings: No bruising, erythema, lesion or rash.  Neurological:  General: No focal deficit present.     Mental Status: She is alert and oriented to person, place, and time. Mental status is at baseline.  Psychiatric:        Mood and Affect: Mood normal.        Behavior: Behavior normal.        Thought Content: Thought content normal.        Judgment: Judgment normal.     Results for orders placed or performed during the hospital encounter of 06/13/20  Glucose, capillary  Result Value Ref Range   Glucose-Capillary 90 70 - 99 mg/dL      Assessment & Plan:   Problem List Items Addressed This Visit      Genitourinary   Hypertensive CKD (chronic kidney disease) - Primary    Still running high at home. Will increase her lisinopril to 40 mg and recheck 2-3 weeks. Call with any concerns. Continue to monitor.           Follow up plan: Return 2-3 weeks follow up  BP.

## 2020-07-19 NOTE — Assessment & Plan Note (Signed)
Still running high at home. Will increase her lisinopril to 40 mg and recheck 2-3 weeks. Call with any concerns. Continue to monitor.

## 2020-08-01 ENCOUNTER — Other Ambulatory Visit: Payer: Self-pay | Admitting: Family Medicine

## 2020-08-01 MED ORDER — METFORMIN HCL ER 500 MG PO TB24
ORAL_TABLET | ORAL | 1 refills | Status: DC
Start: 2020-08-01 — End: 2020-11-28

## 2020-08-01 NOTE — Telephone Encounter (Signed)
Please resend RX to mail order per patient request.

## 2020-08-01 NOTE — Telephone Encounter (Signed)
Pt called stating that her pharmacy [facility] sent her a letter stating they have no gotten a response from the office about metformin refill/ Pt stated she has some left but the pharmacy that the refills were sent to ([facility]in October was incorrect/ pt stated she has never gotten metformin from [facility] and needs those refills sent to [facility]/please advise

## 2020-08-02 ENCOUNTER — Telehealth: Payer: Self-pay

## 2020-08-02 ENCOUNTER — Other Ambulatory Visit: Payer: Self-pay

## 2020-08-02 DIAGNOSIS — Z1211 Encounter for screening for malignant neoplasm of colon: Secondary | ICD-10-CM

## 2020-08-02 NOTE — Telephone Encounter (Signed)
Patient has requested to reschedule her colonoscopy from Friday 08/25/20 to a Wednesday due to needing to take grandchild to school.  Colonoscopy has been rescheduled from Friday 08/25/20 with Dr. Vicente Males to Wed 01/19 with Dr. Bonna Gains.  Patient states that she did not care which physician performed her colonoscopy as long as she could have a Wednesday.    Instructions have been updated and referral has been updated as well.  Thanks,  [place], [place]

## 2020-08-15 DIAGNOSIS — H2513 Age-related nuclear cataract, bilateral: Secondary | ICD-10-CM | POA: Diagnosis not present

## 2020-08-15 LAB — HM DIABETES EYE EXAM

## 2020-08-17 ENCOUNTER — Encounter: Payer: Self-pay | Admitting: Family Medicine

## 2020-08-17 ENCOUNTER — Other Ambulatory Visit: Payer: Self-pay

## 2020-08-17 ENCOUNTER — Ambulatory Visit (INDEPENDENT_AMBULATORY_CARE_PROVIDER_SITE_OTHER): Payer: Medicare HMO | Admitting: Family Medicine

## 2020-08-17 VITALS — BP 136/62 | HR 51 | Temp 98.2°F | Wt 166.0 lb

## 2020-08-17 DIAGNOSIS — I129 Hypertensive chronic kidney disease with stage 1 through stage 4 chronic kidney disease, or unspecified chronic kidney disease: Secondary | ICD-10-CM | POA: Diagnosis not present

## 2020-08-17 DIAGNOSIS — N1831 Chronic kidney disease, stage 3a: Secondary | ICD-10-CM

## 2020-08-17 MED ORDER — AMLODIPINE BESYLATE 2.5 MG PO TABS: 2.5000 mg | ORAL_TABLET | Freq: Every day | ORAL | 3 refills | Status: DC

## 2020-08-17 NOTE — Progress Notes (Signed)
BP 136/62   Pulse (!) 51   Temp 98.2 F (36.8 C)   Wt 166 lb (75.3 kg)   LMP  (LMP Unknown)   SpO2 98%   BMI 31.38 kg/m    Subjective:    Patient ID: Teresa [name], female    DOB: September 18, 1945, 75 y.o.   MRN: [id number]  HPI: Teresa [name] is a 75 y.o. female  Chief Complaint  Patient presents with  . Hypertension   HYPERTENSION Hypertension status: uncontrolled  Satisfied with current treatment? no Duration of hypertension: chronic BP monitoring frequency:  a few times a month BP range: 150s/70s BP medication side effects:  no Medication compliance: excellent compliance Previous BP meds: lisinopril, HCTZ Aspirin: yes Recurrent headaches: no Visual changes: no Palpitations: no Dyspnea: no Chest pain: no Lower extremity edema: no Dizzy/lightheaded: no  Relevant past medical, surgical, family and social history reviewed and updated as indicated. Interim medical history since our last visit reviewed. Allergies and medications reviewed and updated.  Review of Systems  Constitutional: Negative.   Respiratory: Negative.   Cardiovascular: Negative.   Gastrointestinal: Negative.   Musculoskeletal: Negative.   Psychiatric/Behavioral: Negative.     Per HPI unless specifically indicated above     Objective:    BP 136/62   Pulse (!) 51   Temp 98.2 F (36.8 C)   Wt 166 lb (75.3 kg)   LMP  (LMP Unknown)   SpO2 98%   BMI 31.38 kg/m   Wt Readings from Last 3 Encounters:  08/17/20 166 lb (75.3 kg)  07/19/20 164 lb 6.4 oz (74.6 kg)  06/21/20 164 lb 9.6 oz (74.7 kg)    Physical Exam Vitals and nursing note reviewed.  Constitutional:      General: She is not in acute distress.    Appearance: Normal appearance. She is not ill-appearing, toxic-appearing or diaphoretic.  HENT:     Head: Normocephalic and atraumatic.     Right Ear: External ear normal.     Left Ear: External ear normal.     Nose: Nose normal.     Mouth/Throat:     Mouth: Mucous  membranes are moist.     Pharynx: Oropharynx is clear.  Eyes:     General: No scleral icterus.       Right eye: No discharge.        Left eye: No discharge.     Extraocular Movements: Extraocular movements intact.     Conjunctiva/sclera: Conjunctivae normal.     Pupils: Pupils are equal, round, and reactive to light.  Cardiovascular:     Rate and Rhythm: Normal rate and regular rhythm.     Pulses: Normal pulses.     Heart sounds: Normal heart sounds. No murmur heard. No friction rub. No gallop.   Pulmonary:     Effort: Pulmonary effort is normal. No respiratory distress.     Breath sounds: Normal breath sounds. No stridor. No wheezing, rhonchi or rales.  Chest:     Chest wall: No tenderness.  Musculoskeletal:        General: Normal range of motion.     Cervical back: Normal range of motion and neck supple.  Skin:    General: Skin is warm and dry.     Capillary Refill: Capillary refill takes less than 2 seconds.     Coloration: Skin is not jaundiced or pale.     Findings: No bruising, erythema, lesion or rash.  Neurological:     General: No focal  deficit present.     Mental Status: She is alert and oriented to person, place, and time. Mental status is at baseline.  Psychiatric:        Mood and Affect: Mood normal.        Behavior: Behavior normal.        Thought Content: Thought content normal.        Judgment: Judgment normal.     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 Will add 2.5mg  amlodipine and recheck 1-3 months. Call with any concerns. Continue to monitor.           Follow up plan: Return in about 3 months (around 11/15/2020).

## 2020-08-17 NOTE — Assessment & Plan Note (Signed)
Still running high. Will add 2.5mg  amlodipine and recheck 1-3 months. Call with any concerns. Continue to monitor.

## 2020-08-25 ENCOUNTER — Telehealth: Payer: Self-pay | Admitting: General Practice

## 2020-08-25 ENCOUNTER — Ambulatory Visit: Payer: Self-pay | Admitting: General Practice

## 2020-08-25 ENCOUNTER — Telehealth: Payer: Self-pay

## 2020-08-25 DIAGNOSIS — E1122 Type 2 diabetes mellitus with diabetic chronic kidney disease: Secondary | ICD-10-CM

## 2020-08-25 DIAGNOSIS — I129 Hypertensive chronic kidney disease with stage 1 through stage 4 chronic kidney disease, or unspecified chronic kidney disease: Secondary | ICD-10-CM

## 2020-08-25 DIAGNOSIS — E782 Mixed hyperlipidemia: Secondary | ICD-10-CM

## 2020-08-25 DIAGNOSIS — N1831 Chronic kidney disease, stage 3a: Secondary | ICD-10-CM

## 2020-08-25 NOTE — Chronic Care Management (AMB) (Signed)
Chronic Care Management   CCM RN Visit Note  08/25/2020 Name: Teresa [name] MRN: [id number] DOB: 30-Jun-1946  Subjective: Teresa [name] is a 75 y.o. year old female who is a primary care patient of Jeimy, Bickert, DO. The care management team was consulted for assistance with disease management and care coordination needs.    Engaged with patient by telephone for follow up visit in response to provider referral for case management and/or care coordination services.   Consent to Services:  Currently engaged with CCM team  Patient agreed to services and verbal consent obtained.   Assessment: Review of patient past medical history, allergies, medications, health status, including review of consultants reports, laboratory and other test data, was performed as part of comprehensive evaluation and provision of chronic care management services.   SDOH (Social Determinants of Health) assessments and interventions performed:    CCM Care Plan  Allergies  Allergen Reactions   Atorvastatin     cramps   Evista [Raloxifene] Nausea Only    Outpatient Encounter Medications as of 08/25/2020  Medication Sig   amLODipine (NORVASC) 2.5 MG tablet Take 1 tablet (2.5 mg total) by mouth daily.   ASPIRIN 81 PO Take by mouth.   blood glucose meter kit and supplies Dispense based on patient and insurance preference. Use up to four times daily as directed. (FOR ICD-9 250.00, 250.01).   clobetasol ointment (TEMOVATE) 0.05 % Apply topically 2 (two) times daily.   clopidogrel (PLAVIX) 75 MG tablet Take 1 tablet (75 mg total) by mouth daily.   cyanocobalamin 100 MCG tablet Take 100 mcg by mouth daily.   ferrous sulfate 325 (65 FE) MG tablet Take 1 tablet (325 mg total) by mouth 2 (two) times daily with a meal. (Patient taking differently: Take 325 mg by mouth daily with breakfast.)   folic acid (FOLVITE) 800 MCG tablet Take 400 mcg by mouth daily.   glucose blood test strip 1 each by Other  route 2 (two) times daily. 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   Lancets MISC 1 each by Does not apply route daily.   levothyroxine (SYNTHROID) 50 MCG tablet Take 1 tablet (50 mcg total) by mouth daily.   lisinopril (ZESTRIL) 40 MG tablet Take 1 tablet (40 mg total) by mouth daily.   metFORMIN (GLUCOPHAGE-XR) 500 MG 24 hr tablet TAKE 4 TABLETS(2,000MG      TOTAL) DAILY WITH BREAKFAST   rosuvastatin (CRESTOR) 10 MG tablet TAKE 1 TABLET DAILY   No facility-administered encounter medications on file as of 08/25/2020.    Patient Active Problem List   Diagnosis Date Noted   History of colonic polyps 05/18/2020   Bradycardia 02/11/2018   Lumbar radiculopathy 09/27/2017   Advanced care planning/counseling discussion 01/22/2017   Type 2 diabetes mellitus with stage 3 chronic kidney disease, without long-term current use of insulin (HCC) 07/04/2015   CVA (cerebral infarction) 04/03/2015   Obesity 04/03/2015   Lichen sclerosus 04/03/2015   Hypothyroidism 04/03/2015   Osteopenia 04/03/2015   Hyperlipidemia 04/03/2015   B12 deficiency 04/03/2015   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   CKD (chronic kidney disease), stage III (HCC) 04/03/2015   Anemia 03/10/2014    Conditions to be addressed/monitored:HTN, HLD and DMII  Care Plan : RNCM: Diabetes Type 2 (Adult)  Updates made by Marlowe Sax since 08/25/2020 12:00 AM    Problem: RNCM: Glycemic Management (Diabetes, Type 2)  Priority: Medium    Goal: RNCM: Glycemic Management Optimized   Priority: Medium  Note:   Objective:  Lab Results  Component Value Date   HGBA1C 6.1 05/23/2020    Lab Results  Component Value Date   CREATININE 0.85 05/23/2020   CREATININE 1.15 (H) 05/02/2020   CREATININE 1.09 (H) 02/02/2020     No results found for: EGFR Current Barriers:   Knowledge Deficits related to basic  Diabetes pathophysiology and self care/management  Knowledge Deficits related to medications used for management of diabetes  Limited Social Support  Unable to independently manage DM2  Does not contact provider office for questions/concerns Case Manager Clinical Goal(s):   Collaboration with [name], DO regarding development and update of comprehensive plan of care as evidenced by provider attestation and co-signature  Inter-disciplinary care team collaboration (see longitudinal plan of care)  Over the next 120 days, patient will demonstrate improved adherence to prescribed treatment plan for diabetes self care/management as evidenced by:   daily monitoring and recording of CBG   adherence to ADA/ carb modified diet  adherence to prescribed medication regimen Interventions:   Provided education to patient about basic DM disease process  Reviewed medications with patient and discussed importance of medication adherence  Discussed plans with patient for ongoing care management follow up and provided patient with direct contact information for care management team  Provided patient with written educational materials related to hypo and hyperglycemia and importance of correct treatment  Reviewed scheduled/upcoming provider appointments including: 11-23-2020 at 0900 am  Advised patient, providing education and rationale, to check cbg daily  and record, calling pcp for findings outside established parameters.    Review of patient status, including review of consultants reports, relevant laboratory and other test results, and medications completed. Patient Goals/Self-Care Activities  Over the next 120 days, patient will:  - Self administers oral medications as prescribed Self administers insulin as prescribed Attends all scheduled provider appointments Checks blood sugars as prescribed and utilize hyper and hypoglycemia protocol as needed Adheres to prescribed ADA/carb  modified - barriers to adherence to treatment plan identified - blood glucose monitoring encouraged - blood glucose readings reviewed - self-awareness of signs/symptoms of hypo or hyperglycemia encouraged - use of blood glucose monitoring log promoted Follow Up Plan: Telephone follow up appointment with care management team member scheduled for: 10-27-2020 at 0900 am   Task: RNCM: Alleviate Barriers to Glycemic Management   Note:   Care Management Activities:    - barriers to adherence to treatment plan identified - blood glucose monitoring encouraged - blood glucose readings reviewed - self-awareness of signs/symptoms of hypo or hyperglycemia encouraged - use of blood glucose monitoring log promoted      Care Plan : RNCM: Hypertension (Adult)  Updates made by Vanita Ingles since 08/25/2020 12:00 AM    Problem: RNCM: Hypertension (Hypertension)   Priority: Medium    Goal: RNCM: Hypertension Monitored   Priority: Medium  Note:   Objective:   Last practice recorded BP readings:  BP Readings from Last 3 Encounters:  08/17/20 136/62  07/19/20 (!) 148/68  06/21/20 (!) 155/89     Most recent eGFR/CrCl: No results found for: EGFR  No components found for: CRCL Current Barriers:   Knowledge Deficits related to basic understanding of hypertension pathophysiology and self care management  Knowledge Deficits related to understanding of medications prescribed for management of hypertension  Limited Social Support  Unable to independently manage HTN  Lacks social connections  Does  not contact provider office for questions/concerns Case Manager Clinical Goal(s):   Over the next 120 days, patient will verbalize understanding of plan for hypertension management  Over the next 120 days, patient will attend all scheduled medical appointments: 11-23-2020 at 0900 am   Over the next 120 days, patient will demonstrate improved adherence to prescribed treatment plan for hypertension  as evidenced by taking all medications as prescribed, monitoring and recording blood pressure as directed, adhering to low sodium/DASH diet  Over the next 120 days, patient will demonstrate improved health management independence as evidenced by checking blood pressure as directed and notifying PCP if SBP>160 or DBP > 90, taking all medications as prescribe, and adhering to a low sodium diet as discussed. Interventions:   Collaboration with [name], DO regarding development and update of comprehensive plan of care as evidenced by provider attestation and co-signature  Inter-disciplinary care team collaboration (see longitudinal plan of care)  Evaluation of current treatment plan related to hypertension self management and patient's adherence to plan as established by provider.  Provided education to patient re: stroke prevention, s/s of heart attack and stroke, DASH diet, complications of uncontrolled blood pressure  Reviewed medications with patient and discussed importance of compliance  Discussed plans with patient for ongoing care management follow up and provided patient with direct contact information for care management team  Advised patient, providing education and rationale, to monitor blood pressure daily and record, calling PCP for findings outside established parameters.   Reviewed scheduled/upcoming provider appointments including: 11-23-2020 at 0900 am Patient Goals/Self-Care Activities  Over the next 120 days, patient will:  - Self administers medications as prescribed Attends all scheduled provider appointments Calls provider office for new concerns, questions, or BP outside discussed parameters Checks BP and records as discussed Follows a low sodium diet/DASH diet - blood pressure trends reviewed - depression screen reviewed - home or ambulatory blood pressure monitoring encouraged Follow Up Plan: Telephone follow up appointment with care management team member  scheduled for: 10-27-2020 0900 am   Task: RNCM: Identify and Monitor Blood Pressure Elevation   Note:   Care Management Activities:    - blood pressure trends reviewed - depression screen reviewed - home or ambulatory blood pressure monitoring encouraged       Care Plan : RNCM: HLD  Updates made by Vanita Ingles since 08/25/2020 12:00 AM    Problem: HLD: Health Promotion or Disease Self-Management (General Plan of Care)   Priority: Medium    Goal: RNCM: HLD Self-Management Plan Developed   Priority: Medium  Note:   Current Barriers:   Poorly controlled hyperlipidemia, complicated by HTN  Current antihyperlipidemic regimen: Crestor $RemoveBeforeDE'10mg'[id number]$  QD  Most recent lipid panel:     Component Value Date/Time   CHOL 112 05/23/2020 0819   CHOL 147 07/04/2015 0820   TRIG 51 05/23/2020 0819   TRIG 89 07/04/2015 0820   HDL 51 05/23/2020 0819   VLDL 18 07/04/2015 0820   LDLCALC 49 05/23/2020 0819     ASCVD risk enhancing conditions: age >66, DM, HTN, CKD, CHF, current smoker  Unable to independently HLD  Does not contact provider office for questions/concerns  RN Care Manager Clinical Goal(s):   Over the next 120 days, patient will work with Consulting civil engineer, providers, and care team towards execution of optimized self-health management plan  Over the next 120 days, patient will verbalize understanding of plan for effectively managing HLD  Over the next 120 days, patient will work with  RNCM and pcp to address needs related to management of HLD  Over the next 120 days, patient will attend all scheduled medical appointments: 11-23-2020 at 0900  Interventions:  Collaboration with [name], DO regarding development and update of comprehensive plan of care as evidenced by provider attestation and co-signature  Inter-disciplinary care team collaboration (see longitudinal plan of care)  Medication review performed; medication list updated in electronic medical record.    Inter-disciplinary care team collaboration (see longitudinal plan of care)  Referred to pharmacy team for assistance with HLD medication management  Evaluation of current treatment plan related to HLD and patient's adherence to plan as established by provider.  Advised patient to call the office for new questions or concerns   Reviewed medications with patient and discussed compliance   Discussed plans with patient for ongoing care management follow up and provided patient with direct contact information for care management team   Patient Goals/Self-Care Activities:  Over the next 120 days, patient will:   - call for medicine refill 2 or 3 days before it runs out - call if I am sick and can't take my medicine - keep a list of all the medicines I take; vitamins and herbals too - learn to read medicine labels - use a pillbox to sort medicine - use an alarm clock or phone to remind me to take my medicine - change to whole grain breads, cereal, pasta - drink 6 to 8 glasses of water each day - eat 3 to 5 servings of fruits and vegetables each day - eat 5 or 6 small meals each day - fill half the plate with nonstarchy vegetables - limit fast food meals to no more than 1 per week - manage portion size - prepare main meal at home 3 to 5 days each week - read food labels for fat, fiber, carbohydrates and portion size - be open to making changes - I can manage, know and watch for signs of a heart attack - if I have chest pain, call for help - learn about small changes that will make a big difference - learn my personal risk factors - barriers to meeting goals identified - change-talk evoked - choices provided - decision-making supported - problem-solving facilitated - questions answered - reassurance provided - self-reflection promoted - self-reliance encouraged   Follow Up Plan: Telephone follow up appointment with care management team member scheduled for: 10-27-2020 and  0900 am     Task: RNCM: Mutually Develop and Royce Macadamia Achievement of Patient Goals   Note:   Care Management Activities:    - barriers to meeting goals identified - change-talk evoked - choices provided - decision-making supported - problem-solving facilitated - questions answered - reassurance provided - self-reflection promoted - self-reliance encouraged         Plan:Telephone follow up appointment with care management team member scheduled for:  10-27-2020 0900 am  Noreene Larsson RN, MSN, [facility] Mobile: [phone]

## 2020-08-25 NOTE — Patient Instructions (Signed)
Visit Information  Patient Care Plan: RNCM: Diabetes Type 2 (Adult)    Problem Identified: RNCM: Glycemic Management (Diabetes, Type 2)   Priority: Medium    Goal: RNCM: Glycemic Management Optimized   Priority: Medium  Note:   Objective:  Lab Results  Component Value Date   HGBA1C 6.1 05/23/2020 .   Lab Results  Component Value Date   CREATININE 0.85 05/23/2020   CREATININE 1.15 (H) 05/02/2020   CREATININE 1.09 (H) 02/02/2020 .   Marland Kitchen No results found for: EGFR Current Barriers:  Marland Kitchen Knowledge Deficits related to basic Diabetes pathophysiology and self care/management . Knowledge Deficits related to medications used for management of diabetes . Limited Social Support . Unable to independently manage DM2 . Does not contact provider office for questions/concerns Case Manager Clinical Goal(s):  Marland Kitchen Collaboration with [name], DO regarding development and update of comprehensive plan of care as evidenced by provider attestation and co-signature . Inter-disciplinary care team collaboration (see longitudinal plan of care) . Over the next 120 days, patient will demonstrate improved adherence to prescribed treatment plan for diabetes self care/management as evidenced by:  . daily monitoring and recording of CBG  . adherence to ADA/ carb modified diet . adherence to prescribed medication regimen Interventions:  . Provided education to patient about basic DM disease process . Reviewed medications with patient and discussed importance of medication adherence . Discussed plans with patient for ongoing care management follow up and provided patient with direct contact information for care management team . Provided patient with written educational materials related to hypo and hyperglycemia and importance of correct treatment . Reviewed scheduled/upcoming provider appointments including: 11-23-2020 at 0900 am . Advised patient, providing education and rationale, to check cbg daily  and  record, calling pcp for findings outside established parameters.   . Review of patient status, including review of consultants reports, relevant laboratory and other test results, and medications completed. Patient Goals/Self-Care Activities . Over the next 120 days, patient will:  - Self administers oral medications as prescribed Self administers insulin as prescribed Attends all scheduled provider appointments Checks blood sugars as prescribed and utilize hyper and hypoglycemia protocol as needed Adheres to prescribed ADA/carb modified - barriers to adherence to treatment plan identified - blood glucose monitoring encouraged - blood glucose readings reviewed - self-awareness of signs/symptoms of hypo or hyperglycemia encouraged - use of blood glucose monitoring log promoted Follow Up Plan: Telephone follow up appointment with care management team member scheduled for: 10-27-2020 at 0900 am   Task: RNCM: Alleviate Barriers to Glycemic Management   Note:   Care Management Activities:    - barriers to adherence to treatment plan identified - blood glucose monitoring encouraged - blood glucose readings reviewed - self-awareness of signs/symptoms of hypo or hyperglycemia encouraged - use of blood glucose monitoring log promoted      Patient Care Plan: RNCM: Hypertension (Adult)    Problem Identified: RNCM: Hypertension (Hypertension)   Priority: Medium    Goal: RNCM: Hypertension Monitored   Priority: Medium  Note:   Objective:  . Last practice recorded BP readings:  BP Readings from Last 3 Encounters:  08/17/20 136/62  07/19/20 (!) 148/68  06/21/20 (!) 155/89 .   Marland Kitchen Most recent eGFR/CrCl: No results found for: EGFR  No components found for: CRCL Current Barriers:  Marland Kitchen Knowledge Deficits related to basic understanding of hypertension pathophysiology and self care management . Knowledge Deficits related to understanding of medications prescribed for management of  hypertension . Limited  Social Support . Unable to independently manage HTN . Lacks social connections . Does not contact provider office for questions/concerns Case Manager Clinical Goal(s):  Marland Kitchen Over the next 120 days, patient will verbalize understanding of plan for hypertension management . Over the next 120 days, patient will attend all scheduled medical appointments: 11-23-2020 at 0900 am  . Over the next 120 days, patient will demonstrate improved adherence to prescribed treatment plan for hypertension as evidenced by taking all medications as prescribed, monitoring and recording blood pressure as directed, adhering to low sodium/DASH diet . Over the next 120 days, patient will demonstrate improved health management independence as evidenced by checking blood pressure as directed and notifying PCP if SBP>160 or DBP > 90, taking all medications as prescribe, and adhering to a low sodium diet as discussed. Interventions:  . Collaboration with [name], DO regarding development and update of comprehensive plan of care as evidenced by provider attestation and co-signature . Inter-disciplinary care team collaboration (see longitudinal plan of care) . Evaluation of current treatment plan related to hypertension self management and patient's adherence to plan as established by provider. . Provided education to patient re: stroke prevention, s/s of heart attack and stroke, DASH diet, complications of uncontrolled blood pressure . Reviewed medications with patient and discussed importance of compliance . Discussed plans with patient for ongoing care management follow up and provided patient with direct contact information for care management team . Advised patient, providing education and rationale, to monitor blood pressure daily and record, calling PCP for findings outside established parameters.  . Reviewed scheduled/upcoming provider appointments including: 11-23-2020 at 0900 am Patient  Goals/Self-Care Activities . Over the next 120 days, patient will:  - Self administers medications as prescribed Attends all scheduled provider appointments Calls provider office for new concerns, questions, or BP outside discussed parameters Checks BP and records as discussed Follows a low sodium diet/DASH diet - blood pressure trends reviewed - depression screen reviewed - home or ambulatory blood pressure monitoring encouraged Follow Up Plan: Telephone follow up appointment with care management team member scheduled for: 10-27-2020 0900 am   Task: RNCM: Identify and Monitor Blood Pressure Elevation   Note:   Care Management Activities:    - blood pressure trends reviewed - depression screen reviewed - home or ambulatory blood pressure monitoring encouraged       Patient Care Plan: RNCM: HLD    Problem Identified: HLD: Health Promotion or Disease Self-Management (General Plan of Care)   Priority: Medium    Goal: RNCM: HLD Self-Management Plan Developed   Priority: Medium  Note:   Current Barriers:  . Poorly controlled hyperlipidemia, complicated by HTN . Current antihyperlipidemic regimen: Crestor $RemoveBeforeDE'10mg'WENqdHkNmeUuLkH$  QD . Most recent lipid panel:     Component Value Date/Time   CHOL 112 05/23/2020 0819   CHOL 147 07/04/2015 0820   TRIG 51 05/23/2020 0819   TRIG 89 07/04/2015 0820   HDL 51 05/23/2020 0819   VLDL 18 07/04/2015 0820   LDLCALC 49 05/23/2020 0819 .   Marland Kitchen ASCVD risk enhancing conditions: age >3, DM, HTN, CKD, CHF, current smoker . Unable to independently HLD . Does not contact provider office for questions/concerns  RN Care Manager Clinical Goal(s):  Marland Kitchen Over the next 120 days, patient will work with Consulting civil engineer, providers, and care team towards execution of optimized self-health management plan . Over the next 120 days, patient will verbalize understanding of plan for effectively managing HLD . Over the next 120 days, patient  will work with [facility] and pcp to address  needs related to management of HLD . Over the next 120 days, patient will attend all scheduled medical appointments: 11-23-2020 at 0900  Interventions: . Collaboration with [name], DO regarding development and update of comprehensive plan of care as evidenced by provider attestation and co-signature . Inter-disciplinary care team collaboration (see longitudinal plan of care) . Medication review performed; medication list updated in electronic medical record.  Bertram Savin care team collaboration (see longitudinal plan of care) . Referred to pharmacy team for assistance with HLD medication management . Evaluation of current treatment plan related to HLD and patient's adherence to plan as established by provider. . Advised patient to call the office for new questions or concerns  . Reviewed medications with patient and discussed compliance  . Discussed plans with patient for ongoing care management follow up and provided patient with direct contact information for care management team   Patient Goals/Self-Care Activities: . Over the next 120 days, patient will:   - call for medicine refill 2 or 3 days before it runs out - call if I am sick and can't take my medicine - keep a list of all the medicines I take; vitamins and herbals too - learn to read medicine labels - use a pillbox to sort medicine - use an alarm clock or phone to remind me to take my medicine - change to whole grain breads, cereal, pasta - drink 6 to 8 glasses of water each day - eat 3 to 5 servings of fruits and vegetables each day - eat 5 or 6 small meals each day - fill half the plate with nonstarchy vegetables - limit fast food meals to no more than 1 per week - manage portion size - prepare main meal at home 3 to 5 days each week - read food labels for fat, fiber, carbohydrates and portion size - be open to making changes - I can manage, know and watch for signs of a heart attack - if I have chest  pain, call for help - learn about small changes that will make a big difference - learn my personal risk factors - barriers to meeting goals identified - change-talk evoked - choices provided - decision-making supported - problem-solving facilitated - questions answered - reassurance provided - self-reflection promoted - self-reliance encouraged   Follow Up Plan: Telephone follow up appointment with care management team member scheduled for: 10-27-2020 and 0900 am     Task: RNCM: Mutually Develop and Royce Macadamia Achievement of Patient Goals   Note:   Care Management Activities:    - barriers to meeting goals identified - change-talk evoked - choices provided - decision-making supported - problem-solving facilitated - questions answered - reassurance provided - self-reflection promoted - self-reliance encouraged         The patient verbalized understanding of instructions, educational materials, and care plan provided today and declined offer to receive copy of patient instructions, educational materials, and care plan.   Telephone follow up appointment with care management team member scheduled for: 10-27-2020 at 0900 am  Noreene Larsson RN, MSN, [facility] Mobile: [phone]

## 2020-08-28 ENCOUNTER — Other Ambulatory Visit: Payer: Self-pay

## 2020-08-28 ENCOUNTER — Other Ambulatory Visit
Admission: RE | Admit: 2020-08-28 | Discharge: 2020-08-28 | Disposition: A | Discharge status: Home / Self Care | Payer: Medicare HMO | Source: Ambulatory Visit | Attending: Gastroenterology | Admitting: Gastroenterology

## 2020-08-28 DIAGNOSIS — Z01812 Encounter for preprocedural laboratory examination: Secondary | ICD-10-CM | POA: Insufficient documentation

## 2020-08-28 DIAGNOSIS — Z20822 Contact with and (suspected) exposure to covid-19: Secondary | ICD-10-CM | POA: Diagnosis not present

## 2020-08-29 LAB — SARS CORONAVIRUS 2 (TAT 6-24 HRS): SARS Coronavirus 2: NEGATIVE

## 2020-08-30 ENCOUNTER — Ambulatory Visit
Admission: RE | Admit: 2020-08-30 | Discharge: 2020-08-30 | Disposition: A | Discharge status: Home / Self Care | Payer: Medicare HMO | Source: Ambulatory Visit | Attending: Gastroenterology | Admitting: Gastroenterology

## 2020-08-30 ENCOUNTER — Encounter
Admission: RE | Disposition: A | Discharge status: Home / Self Care | Payer: Self-pay | Source: Ambulatory Visit | Attending: Gastroenterology

## 2020-08-30 ENCOUNTER — Ambulatory Visit: Payer: Medicare HMO | Admitting: Anesthesiology

## 2020-08-30 ENCOUNTER — Encounter: Payer: Self-pay | Admitting: Gastroenterology

## 2020-08-30 ENCOUNTER — Other Ambulatory Visit: Payer: Self-pay

## 2020-08-30 DIAGNOSIS — Z7982 Long term (current) use of aspirin: Secondary | ICD-10-CM | POA: Insufficient documentation

## 2020-08-30 DIAGNOSIS — Z87891 Personal history of nicotine dependence: Secondary | ICD-10-CM | POA: Diagnosis not present

## 2020-08-30 DIAGNOSIS — Q438 Other specified congenital malformations of intestine: Secondary | ICD-10-CM | POA: Diagnosis not present

## 2020-08-30 DIAGNOSIS — Z8673 Personal history of transient ischemic attack (TIA), and cerebral infarction without residual deficits: Secondary | ICD-10-CM | POA: Insufficient documentation

## 2020-08-30 DIAGNOSIS — K5730 Diverticulosis of large intestine without perforation or abscess without bleeding: Secondary | ICD-10-CM | POA: Insufficient documentation

## 2020-08-30 DIAGNOSIS — Z79899 Other long term (current) drug therapy: Secondary | ICD-10-CM | POA: Insufficient documentation

## 2020-08-30 DIAGNOSIS — Z86010 Personal history of colonic polyps: Secondary | ICD-10-CM | POA: Insufficient documentation

## 2020-08-30 DIAGNOSIS — Z7984 Long term (current) use of oral hypoglycemic drugs: Secondary | ICD-10-CM | POA: Insufficient documentation

## 2020-08-30 DIAGNOSIS — Z79890 Hormone replacement therapy (postmenopausal): Secondary | ICD-10-CM | POA: Insufficient documentation

## 2020-08-30 DIAGNOSIS — Z1211 Encounter for screening for malignant neoplasm of colon: Secondary | ICD-10-CM | POA: Insufficient documentation

## 2020-08-30 DIAGNOSIS — K5790 Diverticulosis of intestine, part unspecified, without perforation or abscess without bleeding: Secondary | ICD-10-CM | POA: Diagnosis not present

## 2020-08-30 HISTORY — PX: COLONOSCOPY WITH PROPOFOL: SHX5780

## 2020-08-30 HISTORY — DX: Hypothyroidism, unspecified: E03.9

## 2020-08-30 LAB — GLUCOSE, CAPILLARY: Glucose-Capillary: 112 mg/dL — ABNORMAL HIGH (ref 70–99)

## 2020-08-30 SURGERY — COLONOSCOPY WITH PROPOFOL
Anesthesia: General

## 2020-08-30 MED ORDER — SODIUM CHLORIDE 0.9 % IV SOLN: INTRAVENOUS | Status: DC

## 2020-08-30 MED ORDER — LIDOCAINE HCL (CARDIAC) PF 100 MG/5ML IV SOSY
PREFILLED_SYRINGE | INTRAVENOUS | Status: DC | PRN
  Administered 2020-08-30: 50 mg via INTRAVENOUS

## 2020-08-30 MED ORDER — PROPOFOL 10 MG/ML IV BOLUS
INTRAVENOUS | Status: DC | PRN
  Administered 2020-08-30: 80 mg via INTRAVENOUS

## 2020-08-30 MED ORDER — PROPOFOL 500 MG/50ML IV EMUL
INTRAVENOUS | Status: DC | PRN
  Administered 2020-08-30: 125 ug/kg/min via INTRAVENOUS

## 2020-08-30 MED ORDER — PROPOFOL 500 MG/50ML IV EMUL
INTRAVENOUS | Status: AC
  Filled 2020-08-30: qty 50

## 2020-08-30 NOTE — Op Note (Signed)
[facility] Gastroenterology Patient Name: Teresa [name] Procedure Date: 08/30/2020 10:55 AM MRN: [id number] Account #: [id number] Date of Birth: [birth year] Admit Type: Outpatient Age: 75 Room: [facility] ENDO ROOM 3 Gender: Female Note Status: Finalized Procedure:             Colonoscopy Indications:           High risk colon cancer surveillance: Personal history                         of colonic polyps, 2001 colonoscopy with one 42m polyp                         removed, pathology not available. 2015 colonoscopy                         incomplete. Providers:             VLennette Bihari TBonna GainsMD, MD Referring MD:          CKathrine Haddock(Referring MD) Medicines:             Monitored Anesthesia Care Complications:         No immediate complications. Procedure:             Pre-Anesthesia Assessment:                        - Prior to the procedure, a History and Physical was                         performed, and patient medications, allergies and                         sensitivities were reviewed. The patient's tolerance                         of previous anesthesia was reviewed.                        - The risks and benefits of the procedure and the                         sedation options and risks were discussed with the                         patient. All questions were answered and informed                         consent was obtained.                        - Patient identification and proposed procedure were                         verified prior to the procedure by the physician, the                         nurse, the anesthesiologist, the anesthetist and the                         technician. The procedure  was verified in the                         pre-procedure area in the procedure room in the                         endoscopy suite.                        - Prophylactic Antibiotics: The patient does not                         require prophylactic  antibiotics.                        - ASA Grade Assessment: II - A patient with mild                         systemic disease.                        - After reviewing the risks and benefits, the patient                         was deemed in satisfactory condition to undergo the                         procedure.                        - Monitored anesthesia care was determined to be                         medically necessary for this procedure based on review                         of the patient's medical history, medications, and                         prior anesthesia history.                        - The anesthesia plan was to use monitored anesthesia                         care (MAC).                        After obtaining informed consent, the colonoscope was                         passed under direct vision. Throughout the procedure,                         the patient's blood pressure, pulse, and oxygen                         saturations were monitored continuously. The                         Colonoscope was introduced through the anus with the  intention of advancing to the cecum. The scope was                         advanced to the sigmoid colon before the procedure was                         aborted. Medications were given. The colonoscopy was                         performed with ease. The patient tolerated the                         procedure well. The quality of the bowel preparation                         was good. Findings:      The perianal and digital rectal examinations were normal.      Multiple small and large-mouthed diverticula were found in the sigmoid       colon.      The sigmoid colon was significantly tortuous. Advancing the scope       required changing the patient to a supine position, changing the patient       to a prone position and applying abdominal pressure.      Retroflexion in the rectum was not performed due to  Narrow rectum.       Careful frontal view of the rectum was otherwise normal. Impression:            - Diverticulosis in the sigmoid colon.                        - Tortuous colon.                        - No specimens collected. Recommendation:        - Pt had an incomplete colonoscopy in 2015 by Dr.                         Gustavo Lah as well. Double contrast barium enema at that                         time did not show any concerning lesions.                        - Obtain CT colonoscopy to rule out any underlying                         lesions. If CT is negative discuss stopping                         colonoscopy in the future given that pt is going be                         above 75 next year and with more than one failed                         attempts at colonoscopy with significant  diverticulosis seen, risks of future procedure may                         outweight benefits                        - Return to my office in 1-2 months.                        - The findings and recommendations were discussed with                         the patient.                        - The findings and recommendations were discussed with                         the patient's family.                        - Continue present medications. Procedure Code(s):     --- Professional ---                        [phone], 61, Colonoscopy, flexible; diagnostic,                         including collection of specimen(s) by brushing or                         washing, when performed (separate procedure) Diagnosis Code(s):     --- Professional ---                        Z86.010, Personal history of colonic polyps CPT copyright 2019 American Medical Association. All rights reserved. The codes documented in this report are preliminary and upon coder review may  be revised to meet current compliance requirements.  [name], MD Margretta Sidle B. Bonna Gains MD, MD 08/30/2020 11:45:27  AM This report has been signed electronically. Number of Addenda: 0 Note Initiated On: 08/30/2020 10:55 AM Scope Withdrawal Time: 0 hours 2 minutes 34 seconds  Total Procedure Duration: 0 hours 19 minutes 39 seconds  Estimated Blood Loss:  Estimated blood loss: none.      [facility]

## 2020-08-30 NOTE — Transfer of Care (Signed)
Immediate Anesthesia Transfer of Care Note  Patient: Teresa [name]  Procedure(s) Performed: COLONOSCOPY WITH PROPOFOL (N/A )  Patient Location: PACU  Anesthesia Type:MAC  Level of Consciousness: awake and drowsy  Airway & Oxygen Therapy: Patient Spontanous Breathing and Patient connected to nasal cannula oxygen  Post-op Assessment: Report given to RN and Post -op Vital signs reviewed and stable  Post vital signs: stable  Last Vitals:  Vitals Value Taken Time  BP 115/51 08/30/20 1141  Temp 36.1 C 08/30/20 1141  Pulse 62 08/30/20 1143  Resp 21 08/30/20 1143  SpO2 97 % 08/30/20 1143  Vitals shown include unvalidated device data.  Last Pain:  Vitals:   08/30/20 1141  TempSrc: Temporal  PainSc: Asleep         Complications: No complications documented.

## 2020-08-30 NOTE — H&P (Signed)
[name], MD [address] [place], [place], [place], [address] [address], [place], [place], [place], [address] Phone: [phone]  Fax: [phone]  Primary Care Physician:  [name], DO   Pre-Procedure History & Physical: HPI:  Teresa [name] is a 75 y.o. female is here for a colonoscopy.   Past Medical History:  Diagnosis Date  . Diabetes mellitus without complication ([place])   . Heart murmur   . Hyperlipidemia   . Hypertension   . Hypothyroidism   . Lichen sclerosus   . Obesity   . Osteopenia   . Stroke ([place])   . Thyroid disease     Past Surgical History:  Procedure Laterality Date  . carpel tunnel    . ROTATOR CUFF REPAIR Left   . 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      Prior to Admission medications   Medication Sig Start Date End Date Taking? Authorizing Provider  amLODipine (NORVASC) 2.5 MG tablet Take 1 tablet (2.5 mg total) by mouth daily. 08/17/20  Yes Schembri, Megan P, DO  levothyroxine (SYNTHROID) 50 MCG tablet Take 1 tablet (50 mcg total) by mouth daily. 12/14/19  Yes Liming, Megan P, DO  lisinopril (ZESTRIL) 40 MG tablet Take 1 tablet (40 mg total) by mouth daily. 07/19/20  Yes Kolle, Megan P, DO  metFORMIN (GLUCOPHAGE-XR) 500 MG 24 hr tablet TAKE 4 TABLETS(2,$RemoveBeforeDE'000MG'kxmcBwvXHfXVsRp$      TOTAL) DAILY WITH BREAKFAST 08/01/20  Yes Seidner, Megan P, DO  rosuvastatin (CRESTOR) 10 MG tablet TAKE 1 TABLET DAILY 05/22/20  Yes Loder, Megan P, DO  ASPIRIN 81 PO Take by mouth.    [provider]  blood glucose meter kit and supplies Dispense based on patient and insurance preference. Use up to four times daily as directed. (FOR ICD-9 250.00, 250.01). 12/08/17   Kathrine Haddock, NP  clobetasol ointment (TEMOVATE) 0.05 % Apply topically 2 (two) times daily. 05/09/20   Park Liter P, DO  clopidogrel (PLAVIX) 75 MG tablet Take 1 tablet (75 mg total) by mouth daily. 11/02/19   Maj, Megan P, DO  cyanocobalamin 100 MCG tablet Take 100 mcg by mouth daily.     [provider]  ferrous sulfate 325 (65 FE) MG tablet Take 1 tablet (325 mg total) by mouth 2 (two) times daily with a meal. Patient taking differently: Take 325 mg by mouth daily with breakfast. 12/06/19   Park Liter P, DO  folic acid (FOLVITE) 585 MCG tablet Take 400 mcg by mouth daily.    [provider]  glucose blood test strip 1 each by Other route 2 (two) times daily. Use as instructed, Dx: E11.22 02/11/20   Park Liter P, DO  Lancets MISC 1 each by Does not apply route daily. 02/11/20   Park Liter P, DO    Allergies as of 07/11/2020 - Review Complete 06/21/2020  Allergen Reaction Noted  . Atorvastatin  11/28/2017  . Evista [raloxifene] Nausea Only 04/03/2015    Family History  Problem Relation Age of Onset  . Hypertension Mother   . Thyroid disease Mother   . Glaucoma Mother   . Coronary artery disease Mother   . Stroke Father   . Diabetes Brother   . Hypertension Maternal Grandfather   . Breast cancer Neg Hx     Social History   Socioeconomic History  . Marital status: Married    Spouse name: Not on file  . Number of children: Not on file  . Years of education: Not on file  .  Highest education level: High school graduate  Occupational History  . Occupation: retired  Tobacco Use  . Smoking status: Former Smoker    Packs/day: 2.00    Types: Cigarettes    Quit date: 01/1965    Years since quitting: 55.6  . Smokeless tobacco: Never Used  Vaping Use  . Vaping Use: Never used  Substance and Sexual Activity  . Alcohol use: No    Alcohol/week: 0.0 standard drinks  . Drug use: No  . Sexual activity: Never  Other Topics Concern  . Not on file  Social History Narrative  . Not on file   Social Determinants of Health   Financial Resource Strain: Low Risk   . Difficulty of Paying Living Expenses: Not hard at all  Food Insecurity: No Food Insecurity  . Worried About Charity fundraiser in the Last Year: Never true  . Ran Out of Food in  the Last Year: Never true  Transportation Needs: No Transportation Needs  . Lack of Transportation (Medical): No  . Lack of Transportation (Non-Medical): No  Physical Activity: Inactive  . Days of Exercise per Week: 0 days  . Minutes of Exercise per Session: 0 min  Stress: No Stress Concern Present  . Feeling of Stress : Not at all  Social Connections: Socially Integrated  . Frequency of Communication with Friends and Family: More than three times a week  . Frequency of Social Gatherings with Friends and Family: More than three times a week  . Attends Religious Services: More than 4 times per year  . Active Member of Clubs or Organizations: Yes  . Attends Archivist Meetings: More than 4 times per year  . Marital Status: Married  Human resources officer Violence: Not At Risk  . Fear of Current or Ex-Partner: No  . Emotionally Abused: No  . Physically Abused: No  . Sexually Abused: No    Review of Systems: See HPI, otherwise negative ROS  Physical Exam: BP (!) 186/72   Pulse 63   Temp (!) 97.1 F (36.2 C) (Temporal)   Resp 18   Ht $R'5\' 3"'wq$  (1.6 m)   Wt 72.6 kg   LMP  (LMP Unknown)   SpO2 99%   BMI 28.34 kg/m  General:   Alert,  pleasant and cooperative in NAD Head:  Normocephalic and atraumatic. Neck:  Supple; no masses or thyromegaly. Lungs:  Clear throughout to auscultation, normal respiratory effort.    Heart:  +S1, +S2, Regular rate and rhythm, No edema. Abdomen:  Soft, nontender and nondistended. Normal bowel sounds, without guarding, and without rebound.   Neurologic:  Alert and  oriented x4;  grossly normal neurologically.  Impression/Plan: Teresa [name] is here for a colonoscopy to be performed for history of polyps. Pathology report not available. 2015 colonoscopy with Dr. Gustavo Lah incomplete. 2001 colonoscopy with one 5 mm polyp removed.   Risks, benefits, limitations, and alternatives regarding  colonoscopy have been reviewed with the patient.  Questions  have been answered.  All parties agreeable.   Virgel Manifold, MD  08/30/2020, 10:28 AM

## 2020-08-30 NOTE — Anesthesia Postprocedure Evaluation (Signed)
Anesthesia Post Note  Patient: Teresa [name]  Procedure(s) Performed: COLONOSCOPY WITH PROPOFOL (N/A )  Patient location during evaluation: PACU Anesthesia Type: General Level of consciousness: awake and alert, awake and oriented Pain management: pain level controlled Vital Signs Assessment: post-procedure vital signs reviewed and stable Respiratory status: spontaneous breathing, nonlabored ventilation and respiratory function stable Cardiovascular status: blood pressure returned to baseline and stable Postop Assessment: no apparent nausea or vomiting Anesthetic complications: no   No complications documented.   Last Vitals:  Vitals:   08/30/20 1151 08/30/20 1211  BP: (!) 160/54 (!) 169/58  Pulse:    Resp: 18   Temp:    SpO2:  100%    Last Pain:  Vitals:   08/30/20 1201  TempSrc:   PainSc: 0-No pain                 Phill Mutter

## 2020-08-30 NOTE — Anesthesia Preprocedure Evaluation (Signed)
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 but they said it's nothing to worry about"))  Rhythm:regular Rate:Normal     Neuro/Psych TIAnegative psych ROS   GI/Hepatic negative GI ROS, Neg liver ROS,   Endo/Other  diabetesHypothyroidism   Renal/GU Renal disease (CKD)  negative genitourinary   Musculoskeletal   Abdominal   Peds  Hematology  (+) Blood dyscrasia, anemia ,   Anesthesia Other Findings Past Medical History: No date: Diabetes mellitus without complication (HCC) No date: Heart murmur No date: Hyperlipidemia No date: Hypertension No date: Hypothyroidism No date: Lichen sclerosus No date: Obesity No date: Osteopenia No date: Stroke [facility]) No date: Thyroid disease  Past Surgical History: No date: carpel tunnel No date: ROTATOR CUFF REPAIR; Left No date: 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  BMI    Body Mass Index: 28.34 kg/m      Reproductive/Obstetrics negative OB ROS                             Anesthesia Physical Anesthesia Plan  ASA: II  Anesthesia Plan: General   Post-op Pain Management:    Induction:   PONV Risk Score and Plan: Propofol infusion and TIVA  Airway Management Planned: Nasal Cannula  Additional Equipment:   Intra-op Plan:   Post-operative Plan:   Informed Consent: I have reviewed the patients  History and Physical, chart, labs and discussed the procedure including the risks, benefits and alternatives for the proposed anesthesia with the patient or authorized representative who has indicated his/her understanding and acceptance.     Dental Advisory Given  Plan Discussed with: Anesthesiologist, CRNA and Surgeon  Anesthesia Plan Comments:         Anesthesia Quick Evaluation

## 2020-08-31 ENCOUNTER — Encounter: Payer: Self-pay | Admitting: Gastroenterology

## 2020-09-04 ENCOUNTER — Telehealth: Payer: Self-pay | Admitting: Gastroenterology

## 2020-09-04 DIAGNOSIS — Z1211 Encounter for screening for malignant neoplasm of colon: Secondary | ICD-10-CM

## 2020-09-04 NOTE — Telephone Encounter (Signed)
Patient has questions about appointment needed for future, Dr T recommended 1-2 month follow up after her procedure.  Patient did not understand the need for the appointment, please advise.

## 2020-09-18 ENCOUNTER — Other Ambulatory Visit: Payer: Self-pay | Admitting: Family Medicine

## 2020-09-18 MED ORDER — LEVOTHYROXINE SODIUM 50 MCG PO TABS
50.0000 ug | ORAL_TABLET | Freq: Every day | ORAL | 0 refills | Status: DC
Start: 2020-09-18 — End: 2020-12-04

## 2020-09-18 NOTE — Telephone Encounter (Signed)
Pt needs a new refill on her Levothyroxine 50   She uses [facility]

## 2020-09-19 ENCOUNTER — Other Ambulatory Visit: Payer: Self-pay

## 2020-09-19 NOTE — Telephone Encounter (Signed)
Was seen in January 2022

## 2020-09-22 ENCOUNTER — Telehealth: Payer: Self-pay | Admitting: Gastroenterology

## 2020-09-22 ENCOUNTER — Other Ambulatory Visit: Payer: Self-pay

## 2020-09-22 NOTE — Telephone Encounter (Signed)
Patient called LM on VM to call back to schedule her Ultrasound. Please call to advise

## 2020-09-23 MED ORDER — CLOBETASOL PROPIONATE 0.05 % EX OINT
TOPICAL_OINTMENT | Freq: Two times a day (BID) | CUTANEOUS | 3 refills | Status: DC
Start: 2020-09-23 — End: 2021-10-05

## 2020-09-28 NOTE — Telephone Encounter (Signed)
Called and left a message for call back  

## 2020-09-28 NOTE — Telephone Encounter (Signed)
Document in other message

## 2020-09-28 NOTE — Telephone Encounter (Signed)
Patient is calling because she had a colonoscopy on 08/30/2020. She states that DR. Tahiliani wanted her to have a ultrasound done. Informed Patient I would ask the doctor and get back to her. I see on the colonoscopy report she needs a CT colonoscopy. Please advised

## 2020-10-03 NOTE — Addendum Note (Signed)
Addended by: Ulyess Blossom L on: 10/03/2020 09:44 AM   Modules accepted: Orders

## 2020-10-03 NOTE — Telephone Encounter (Signed)
Faxed the order to [facility] for CT colonoscopy. Also make her a follow up appointment in April

## 2020-10-10 ENCOUNTER — Ambulatory Visit: Payer: Medicare HMO | Admitting: Cardiology

## 2020-10-11 ENCOUNTER — Encounter: Payer: Self-pay | Admitting: Cardiology

## 2020-10-19 ENCOUNTER — Other Ambulatory Visit: Payer: Self-pay

## 2020-10-19 MED ORDER — ROSUVASTATIN CALCIUM 10 MG PO TABS: 10.0000 mg | ORAL_TABLET | Freq: Every day | ORAL | 1 refills | Status: DC

## 2020-10-20 ENCOUNTER — Other Ambulatory Visit: Payer: Self-pay | Admitting: Family Medicine

## 2020-10-20 NOTE — Telephone Encounter (Signed)
Requested medication (s) are due for refill today:   Prescribed yesterday 3/10 by Dr. Wynetta Emery  Requested medication (s) are on the active medication list:   Yes  Future visit scheduled:   Yes   Last ordered: 10/19/2020 #90, 1 refill  Clinic note:  Returned because pharmacy has noted there is an allergy to statins.   See pharmacy note.   Requested Prescriptions  Pending Prescriptions Disp Refills   CRESTOR 10 MG tablet [Pharmacy Med Name: CRESTOR TAB 10MG ]  1    Sig: TAKE 1 TABLET DAILY.      Cardiovascular:  Antilipid - Statins Failed - 10/20/2020 12:20 PM      Failed - LDL in normal range and within 360 days    LDL Chol Calc (NIH)  Date Value Ref Range Status  05/23/2020 49 0 - 99 mg/dL Final          Passed - Total Cholesterol in normal range and within 360 days    Cholesterol, Total  Date Value Ref Range Status  05/23/2020 112 100 - 199 mg/dL Final   Cholesterol Piccolo, Waived  Date Value Ref Range Status  07/04/2015 147 <200 mg/dL Final    Comment:                            Desirable                <200                         Borderline High      200- 239                         High                     >239           Passed - HDL in normal range and within 360 days    HDL  Date Value Ref Range Status  05/23/2020 51 >39 mg/dL Final          Passed - Triglycerides in normal range and within 360 days    Triglycerides  Date Value Ref Range Status  05/23/2020 51 0 - 149 mg/dL Final   Triglycerides Piccolo,Waived  Date Value Ref Range Status  07/04/2015 89 <150 mg/dL Final    Comment:                            Normal                   <150                         Borderline High     150 - 199                         High                200 - 499                         Very High                >499           Passed - Patient  is not pregnant      Passed - Valid encounter within last 12 months    Recent Outpatient Visits           2 months ago  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 ([place])   [place], Megan P, DO   3 months ago 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

## 2020-10-20 NOTE — Telephone Encounter (Signed)
OK to fill

## 2020-10-23 ENCOUNTER — Telehealth: Payer: Self-pay

## 2020-10-23 NOTE — Telephone Encounter (Signed)
Copied from [place] [id number]. Topic: General - Other >> Oct 23, 2020  2:08 PM Pawlus, Brayton Layman A wrote: Reason for CRM: Pt called stating she has received some messages on MyChart regarding her medications, wanted a follow up call form Dr Wynetta Emery or her nurse. Please advise.

## 2020-10-23 NOTE — Telephone Encounter (Signed)
Patient notified that medication was approved, it was being stopped due to a statin allergy.

## 2020-10-24 ENCOUNTER — Ambulatory Visit: Payer: Medicare HMO

## 2020-10-25 ENCOUNTER — Telehealth: Payer: Self-pay

## 2020-10-25 NOTE — Telephone Encounter (Signed)
Copied from [place] [id number]. Topic: General - Other >> Oct 25, 2020  2:42 PM Keene Breath wrote: Reason for CRM: Patient called to inform the nurse and doctor that her insurance is not going to cover the ultrasound that was order for her screening.  Patient would like to know if there is an alternative.  Please advise and call patient to discuss at [phone]

## 2020-10-25 NOTE — Telephone Encounter (Signed)
Will leave for Dr. Wynetta Emery to review upon return.

## 2020-10-27 ENCOUNTER — Telehealth: Payer: Self-pay

## 2020-10-29 NOTE — Telephone Encounter (Signed)
Can we please find out additional information? I have not ordered any ultrasounds

## 2020-10-30 ENCOUNTER — Ambulatory Visit: Payer: Medicare HMO | Admitting: Physician Assistant

## 2020-10-30 NOTE — Telephone Encounter (Signed)
Called and spoke with patient, she states that she had a colonoscopy and they could not see anything, they needed to do an ultrasound, informed patient to reach out to her GI doctor.

## 2020-10-31 ENCOUNTER — Inpatient Hospital Stay: Admission: RE | Admit: 2020-10-31 | Payer: Medicare HMO | Source: Ambulatory Visit

## 2020-11-03 ENCOUNTER — Ambulatory Visit: Payer: Medicare HMO | Admitting: Physician Assistant

## 2020-11-14 ENCOUNTER — Ambulatory Visit: Payer: Medicare HMO | Admitting: Physician Assistant

## 2020-11-20 ENCOUNTER — Encounter: Payer: Self-pay | Admitting: Gastroenterology

## 2020-11-20 ENCOUNTER — Ambulatory Visit (INDEPENDENT_AMBULATORY_CARE_PROVIDER_SITE_OTHER): Payer: Medicare HMO | Admitting: Gastroenterology

## 2020-11-20 ENCOUNTER — Other Ambulatory Visit: Payer: Self-pay

## 2020-11-20 VITALS — BP 180/75 | HR 53 | Temp 97.5°F | Ht 60.98 in | Wt 168.0 lb

## 2020-11-20 DIAGNOSIS — Q438 Other specified congenital malformations of intestine: Secondary | ICD-10-CM

## 2020-11-20 DIAGNOSIS — K5790 Diverticulosis of intestine, part unspecified, without perforation or abscess without bleeding: Secondary | ICD-10-CM

## 2020-11-20 NOTE — Patient Instructions (Addendum)
We will try to get your CT colonoscopy approved so you could have it done. Please pick up your solution at least 4 days before your procedure. Thank you.  If you can not make it, please call [facility] at [phone].

## 2020-11-20 NOTE — Progress Notes (Signed)
[name], MD [address]  [place]  [place], [place] [address]  Main: [phone]  Fax: [phone]   Primary Care Physician: [name], DO   Chief Complaint  Patient presents with  . History of colon polyps    HPI: Teresa [name] is a 75 y.o. female was seen for a colonoscopy in January 2022 for history of polyps, and procedure was incomplete despite position changes and abdominal pressure, due to tortuous sigmoid colon, with multiple diverticula in the area.  Prior to that she had a colonoscopy in 2015 with Dr. Gustavo Lah that was incomplete, and in 2001 colonoscopy with 1, 8mm polyp removed.  Pathology report not available.  CT colonoscopy was ordered, but patient was unable to get it done as patient states it was not approved by her insurance.  The patient denies abdominal or flank pain, anorexia, nausea or vomiting, dysphagia, change in bowel habits or black or bloody stools or weight loss.  No family history of colon cancer  Current Outpatient Medications  Medication Sig Dispense Refill  . amLODipine (NORVASC) 2.5 MG tablet Take 1 tablet (2.5 mg total) by mouth daily. 30 tablet 3  . ASPIRIN 81 PO Take by mouth.    . blood glucose meter kit and supplies Dispense based on patient and insurance preference. Use up to four times daily as directed. (FOR ICD-9 250.00, 250.01). 1 each 0  . clobetasol ointment (TEMOVATE) 0.05 % Apply topically 2 (two) times daily. 30 g 3  . clopidogrel (PLAVIX) 75 MG tablet Take 1 tablet (75 mg total) by mouth daily. 90 tablet 3  . CRESTOR 10 MG tablet TAKE 1 TABLET DAILY. 90 tablet 1  . cyanocobalamin 100 MCG tablet Take 100 mcg by mouth daily.    . ferrous sulfate 325 (65 FE) MG tablet Take 1 tablet (325 mg total) by mouth 2 (two) times daily with a meal. (Patient taking differently: Take 325 mg by mouth daily with breakfast.) 570 tablet 3  . folic acid (FOLVITE) 177 MCG tablet Take 400 mcg by mouth daily.    Marland Kitchen glucose  blood test strip 1 each by Other route 2 (two) times daily. Use as instructed, Dx: E11.22 100 each 12  . Lancets MISC 1 each by Does not apply route daily. 100 each 12  . levothyroxine (SYNTHROID) 50 MCG tablet Take 1 tablet (50 mcg total) by mouth daily. 90 tablet 0  . lisinopril (ZESTRIL) 40 MG tablet Take 1 tablet (40 mg total) by mouth daily. 90 tablet 1  . metFORMIN (GLUCOPHAGE-XR) 500 MG 24 hr tablet TAKE 4 TABLETS(2,$RemoveBeforeDE'000MG'[id number]$      TOTAL) DAILY WITH BREAKFAST 360 tablet 1   No current facility-administered medications for this visit.    Allergies as of 11/20/2020 - Review Complete 11/20/2020  Allergen Reaction Noted  . Atorvastatin  11/28/2017  . Evista [raloxifene] Nausea Only 04/03/2015    ROS:  General: Negative for anorexia, weight loss, fever, chills, fatigue, weakness. ENT: Negative for hoarseness, difficulty swallowing , nasal congestion. CV: Negative for chest pain, angina, palpitations, dyspnea on exertion, peripheral edema.  Respiratory: Negative for dyspnea at rest, dyspnea on exertion, cough, sputum, wheezing.  GI: See history of present illness. GU:  Negative for dysuria, hematuria, urinary incontinence, urinary frequency, nocturnal urination.  Endo: Negative for unusual weight change.    Physical Examination:   BP (!) 180/75   Pulse (!) 53   Temp (!) 97.5 F (36.4 C) (Oral)   Ht 5' 0.98" (1.549  m)   Wt 168 lb (76.2 kg)   LMP  (LMP Unknown)   BMI 31.76 kg/m   General: Well-nourished, well-developed in no acute distress.  Eyes: No icterus. Conjunctivae pink. Mouth: Oropharyngeal mucosa moist and pink , no lesions erythema or exudate. Neck: Supple, Trachea midline Abdomen: Bowel sounds are normal, nontender, nondistended, no hepatosplenomegaly or masses, no abdominal bruits or hernia , no rebound or guarding.   Extremities: No lower extremity edema. No clubbing or deformities. Neuro: Alert and oriented x 3.  Grossly intact. Skin: Warm and dry, no jaundice.    Psych: Alert and cooperative, normal mood and affect.   Labs: CMP     Component Value Date/Time   NA 144 05/23/2020 0819   K 4.2 05/23/2020 0819   CL 109 (H) 05/23/2020 0819   CO2 25 05/23/2020 0819   GLUCOSE 155 (H) 05/23/2020 0819   GLUCOSE 136 (H) 09/27/2017 0409   BUN 13 05/23/2020 0819   CREATININE 0.85 05/23/2020 0819   CALCIUM 9.1 05/23/2020 0819   PROT 6.2 05/23/2020 0819   ALBUMIN 4.1 05/23/2020 0819   AST 9 05/23/2020 0819   ALT 10 05/23/2020 0819   ALKPHOS 81 05/23/2020 0819   BILITOT 0.2 05/23/2020 0819   GFRNONAA 68 05/23/2020 0819   GFRAA 79 05/23/2020 0819   Lab Results  Component Value Date   WBC 7.3 05/23/2020   HGB 9.2 (L) 05/23/2020   HCT 27.8 (L) 05/23/2020   MCV 94 05/23/2020   PLT 214 05/23/2020   Normocytic anemia since 2018, with normal ferritin and iron in 2018 and 2019, being managed by PCP  Imaging Studies: No results found.  Assessment and Plan:   Teresa [name] is a 75 y.o. y/o female previously seen during outpatient colonoscopy, with incomplete procedure due to diverticulosis and severe angulation in the area  Colonoscopy in 2015 by Dr. Gustavo Lah, and in January 2022 were both incomplete  Therefore, repeat colonoscopies for screening in this patient would be higher risks than benefit, as they would likely continue to be either incomplete, or a tortuous colon with increased risks of perforation  Therefore, CT colonoscopy for screening would be safer and allow for evaluation of any polyps.  Patient would like to proceed with CT colonoscopy and we will try to obtain Prior Authorization from the insurance  High fiber diet recommended. Pt reports 1 formed BM daily  Continue follow-up with PCP for normocytic anemia  Dr [name]

## 2020-11-23 ENCOUNTER — Ambulatory Visit: Payer: Medicare HMO | Admitting: Family Medicine

## 2020-11-27 ENCOUNTER — Other Ambulatory Visit: Payer: Self-pay

## 2020-11-28 ENCOUNTER — Encounter: Payer: Self-pay | Admitting: Family Medicine

## 2020-11-28 ENCOUNTER — Ambulatory Visit (INDEPENDENT_AMBULATORY_CARE_PROVIDER_SITE_OTHER): Payer: Medicare HMO | Admitting: Family Medicine

## 2020-11-28 ENCOUNTER — Other Ambulatory Visit: Payer: Self-pay

## 2020-11-28 VITALS — BP 160/74 | HR 57 | Temp 98.2°F | Wt 169.4 lb

## 2020-11-28 DIAGNOSIS — N1831 Chronic kidney disease, stage 3a: Secondary | ICD-10-CM

## 2020-11-28 DIAGNOSIS — Z23 Encounter for immunization: Secondary | ICD-10-CM

## 2020-11-28 DIAGNOSIS — E1122 Type 2 diabetes mellitus with diabetic chronic kidney disease: Secondary | ICD-10-CM

## 2020-11-28 DIAGNOSIS — E782 Mixed hyperlipidemia: Secondary | ICD-10-CM | POA: Diagnosis not present

## 2020-11-28 DIAGNOSIS — E039 Hypothyroidism, unspecified: Secondary | ICD-10-CM | POA: Diagnosis not present

## 2020-11-28 DIAGNOSIS — S41101A Unspecified open wound of right upper arm, initial encounter: Secondary | ICD-10-CM | POA: Diagnosis not present

## 2020-11-28 DIAGNOSIS — I129 Hypertensive chronic kidney disease with stage 1 through stage 4 chronic kidney disease, or unspecified chronic kidney disease: Secondary | ICD-10-CM | POA: Diagnosis not present

## 2020-11-28 LAB — BAYER DCA HB A1C WAIVED: HB A1C (BAYER DCA - WAIVED): 6.4 %

## 2020-11-28 MED ORDER — METFORMIN HCL ER 500 MG PO TB24: ORAL_TABLET | ORAL | 1 refills | Status: DC

## 2020-11-28 MED ORDER — CHLORTHALIDONE 25 MG PO TABS: 25.0000 mg | ORAL_TABLET | Freq: Every day | ORAL | 2 refills | Status: DC

## 2020-11-28 MED ORDER — ROSUVASTATIN CALCIUM 10 MG PO TABS: 10.0000 mg | ORAL_TABLET | Freq: Every day | ORAL | 1 refills | Status: DC

## 2020-11-28 MED ORDER — LISINOPRIL 40 MG PO TABS: 40.0000 mg | ORAL_TABLET | Freq: Every day | ORAL | 1 refills | Status: DC

## 2020-11-28 NOTE — Patient Instructions (Signed)
Td (Tetanus, Diphtheria) Vaccine: What You Need to Know 1. Why get vaccinated? Td vaccine can prevent tetanus and diphtheria. Tetanus enters the body through cuts or wounds. Diphtheria spreads from person to person.  TETANUS (T) causes painful stiffening of the muscles. Tetanus can lead to serious health problems, including being unable to open the mouth, having trouble swallowing and breathing, or death.  DIPHTHERIA (D) can lead to difficulty breathing, heart failure, paralysis, or death. 2. Td vaccine Td is only for children 7 years and older, adolescents, and adults.  Td is usually given as a booster dose every 10 years, or after 5 years in the case of a severe or dirty wound or burn. Another vaccine, called "Tdap," may be used instead of Td. Tdap protects against pertussis, also known as "whooping cough," in addition to tetanus and diphtheria. Td may be given at the same time as other vaccines. 3. Talk with your health care provider Tell your vaccination provider if the person getting the vaccine:  Has had an allergic reaction after a previous dose of any vaccine that protects against tetanus or diphtheria, or has any severe, life-threatening allergies  Has ever had Guillain-Barr Syndrome (also called "GBS")  Has had severe pain or swelling after a previous dose of any vaccine that protects against tetanus or diphtheria In some cases, your health care provider may decide to postpone Td vaccination until a future visit. People with minor illnesses, such as a cold, may be vaccinated. People who are moderately or severely ill should usually wait until they recover before getting Td vaccine.  Your health care provider can give you more information. 4. Risks of a vaccine reaction  Pain, redness, or swelling where the shot was given, mild fever, headache, feeling tired, and nausea, vomiting, diarrhea, or stomachache sometimes happen after Td vaccination. People sometimes faint after medical  procedures, including vaccination. Tell your provider if you feel dizzy or have vision changes or ringing in the ears.  As with any medicine, there is a very remote chance of a vaccine causing a severe allergic reaction, other serious injury, or death. 5. What if there is a serious problem? An allergic reaction could occur after the vaccinated person leaves the clinic. If you see signs of a severe allergic reaction (hives, swelling of the face and throat, difficulty breathing, a fast heartbeat, dizziness, or weakness), call 9-1-1 and get the person to the nearest hospital.  For other signs that concern you, call your health care provider.  Adverse reactions should be reported to the Vaccine Adverse Event Reporting System (VAERS). Your health care provider will usually file this report, or you can do it yourself. Visit the VAERS website at www.vaers.SamedayNews.es or call (978) 369-3437. VAERS is only for reporting reactions, and VAERS staff members do not give medical advice. 6. The National Vaccine Injury Compensation Program The [employer] Vaccine Injury Compensation Program (VICP) is a federal program that was created to compensate people who may have been injured by certain vaccines. Claims regarding alleged injury or death due to vaccination have a time limit for filing, which may be as short as two years. Visit the VICP website at GoldCloset.com.ee or call 289 241 1188 to learn about the program and about filing a claim. 7. How can I learn more?  Ask your health care provider.  Call your local or state health department.  Visit the website of the Food and Drug Administration (FDA) for vaccine package inserts and additional information at TraderRating.uy.  Contact the Centers for  Disease Control and Prevention (CDC): ? Call 332-396-8602 (1-800-CDC-INFO) or ? Visit CDC's website at http://hunter.com/. Vaccine Information Statement Td (Tetanus,  Diphtheria) Vaccine (03/17/2020) This information is not intended to replace advice given to you by your health care provider. Make sure you discuss any questions you have with your health care provider. Document Revised: 05/04/2020 Document Reviewed: 05/04/2020 Elsevier Patient Education  2021 [employer].

## 2020-11-28 NOTE — Assessment & Plan Note (Signed)
Rechecking labs today. Await results. Treat as needed.  °

## 2020-11-28 NOTE — Assessment & Plan Note (Signed)
Leg swelling with amlodipine and not under good control. Will stop amlodipine and start chlorthalidone. Recheck 2-3 weeks. Call with any concerns.

## 2020-11-28 NOTE — Assessment & Plan Note (Signed)
Under good control with A1c of 6.4- will continue current regimen. Call with any concerns. Continue to monitor.

## 2020-11-28 NOTE — Assessment & Plan Note (Signed)
Under good control on current regimen. Continue current regimen. Continue to monitor. Call with any concerns. Refills given. Labs drawn today.   

## 2020-11-28 NOTE — Progress Notes (Signed)
BP (!) 160/74   Pulse (!) 57   Temp 98.2 F (36.8 C)   Wt 169 lb 6.4 oz (76.8 kg)   LMP  (LMP Unknown)   SpO2 100%   BMI 32.02 kg/m    Subjective:    Patient ID: Teresa [name], female    DOB: 03/02/1946, 74 y.o.   MRN: [id number]  HPI: Teresa [name] is a 75 y.o. female  Chief Complaint  Patient presents with  . Hypertension  . Hyperlipidemia  . Diabetes   HYPERTENSION / HYPERLIPIDEMIA Satisfied with current treatment? no Duration of hypertension: chronic BP monitoring frequency: daily BP range: 150s-160s/70s-80s BP medication side effects: yes- swelling in her ankles Past BP meds: lisinopril, amlodipine Duration of hyperlipidemia: chronic Cholesterol medication side effects: yes Cholesterol supplements: none Past cholesterol medications: crestor Medication compliance: excellent compliance Aspirin: yes Recent stressors: no Recurrent headaches: no Visual changes: no Palpitations: no Dyspnea: no Chest pain: no Lower extremity edema: yes Dizzy/lightheaded: no  DIABETES Hypoglycemic episodes:no Polydipsia/polyuria: no Visual disturbance: no Chest pain: no Paresthesias: no Glucose Monitoring: no  Accucheck frequency: Not Checking Taking Insulin?: no Blood Pressure Monitoring: not checking Retinal Examination: Up to Date Foot Exam: Up to Date Diabetic Education: Completed Pneumovax: Up to Date Influenza: Not up to Date Aspirin: yes  HYPOTHYROIDISM Thyroid control status:controlled Satisfied with current treatment? yes Medication side effects: yes Medication compliance: excellent compliance Recent dose adjustment:no Fatigue: no Cold intolerance: no Heat intolerance: no Weight gain: no Weight loss: no Constipation: no Diarrhea/loose stools: no Palpitations: no Lower extremity edema: no Anxiety/depressed mood: no  Relevant past medical, surgical, family and social history reviewed and updated as indicated. Interim medical history since  our last visit reviewed. Allergies and medications reviewed and updated.  Review of Systems  Constitutional: Negative.   Respiratory: Negative.   Cardiovascular: Positive for leg swelling. Negative for chest pain and palpitations.  Gastrointestinal: Negative.   Musculoskeletal: Negative.   Psychiatric/Behavioral: Negative.     Per HPI unless specifically indicated above     Objective:    BP (!) 160/74   Pulse (!) 57   Temp 98.2 F (36.8 C)   Wt 169 lb 6.4 oz (76.8 kg)   LMP  (LMP Unknown)   SpO2 100%   BMI 32.02 kg/m   Wt Readings from Last 3 Encounters:  11/28/20 169 lb 6.4 oz (76.8 kg)  11/20/20 168 lb (76.2 kg)  08/30/20 160 lb (72.6 kg)    Physical Exam Vitals and nursing note reviewed.  Constitutional:      General: She is not in acute distress.    Appearance: Normal appearance. She is not ill-appearing, toxic-appearing or diaphoretic.  HENT:     Head: Normocephalic and atraumatic.     Right Ear: External ear normal.     Left Ear: External ear normal.     Nose: Nose normal.     Mouth/Throat:     Mouth: Mucous membranes are moist.     Pharynx: Oropharynx is clear.  Eyes:     General: No scleral icterus.       Right eye: No discharge.        Left eye: No discharge.     Extraocular Movements: Extraocular movements intact.     Conjunctiva/sclera: Conjunctivae normal.     Pupils: Pupils are equal, round, and reactive to light.  Cardiovascular:     Rate and Rhythm: Normal rate and regular rhythm.     Pulses: Normal pulses.  Heart sounds: Normal heart sounds. No murmur heard. No friction rub. No gallop.   Pulmonary:     Effort: Pulmonary effort is normal. No respiratory distress.     Breath sounds: Normal breath sounds. No stridor. No wheezing, rhonchi or rales.  Chest:     Chest wall: No tenderness.  Musculoskeletal:        General: Normal range of motion.     Cervical back: Normal range of motion and neck supple.  Skin:    General: Skin is warm and  dry.     Capillary Refill: Capillary refill takes less than 2 seconds.     Coloration: Skin is not jaundiced or pale.     Findings: No bruising, erythema, lesion or rash.     Comments: Small well healing wound on R upper arm  Neurological:     General: No focal deficit present.     Mental Status: She is alert and oriented to person, place, and time. Mental status is at baseline.  Psychiatric:        Mood and Affect: Mood normal.        Behavior: Behavior normal.        Thought Content: Thought content normal.        Judgment: Judgment normal.     Results for orders placed or performed during the hospital encounter of 08/30/20  Glucose, capillary  Result Value Ref Range   Glucose-Capillary 112 (H) 70 - 99 mg/dL      Assessment & Plan:   Problem List Items Addressed This Visit      Endocrine   Hypothyroidism    Rechecking labs today. Await results. Treat as needed.       Relevant Orders   TSH   Type 2 diabetes mellitus with stage 3 chronic kidney disease, without long-term current use of insulin (HCC)    Under good control with A1c of 6.4- will continue current regimen. Call with any concerns. Continue to monitor.       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 Will stop amlodipine and start chlorthalidone. Recheck 2-3 weeks. Call with any concerns.       Relevant Orders   Comprehensive metabolic panel     Other   Hyperlipidemia    Under good control on current regimen. Continue current regimen. Continue to monitor. Call with any concerns. Refills given. Labs drawn today.       Relevant Medications   chlorthalidone (HYGROTON) 25 MG tablet    lisinopril (ZESTRIL) 40 MG tablet   rosuvastatin (CRESTOR) 10 MG tablet   Other Relevant Orders   Comprehensive metabolic panel   Lipid Panel w/o Chol/HDL Ratio    Other Visit Diagnoses    Open wound of right upper arm, initial encounter       Healing well. Consistent with a bug bite. Due for Td. Administered today.   Relevant Orders   Td : Tetanus/diphtheria >7yo Preservative  free (Completed)       Follow up plan: Return 2-3 weeks follow up BP.

## 2020-11-30 ENCOUNTER — Telehealth: Payer: Self-pay | Admitting: Family Medicine

## 2020-11-30 LAB — COMPREHENSIVE METABOLIC PANEL WITH GFR
ALT: 12 IU/L (ref 0–32)
AST: 13 IU/L (ref 0–40)
Albumin/Globulin Ratio: 1.7 (ref 1.2–2.2)
Albumin: 4.3 g/dL (ref 3.7–4.7)
Alkaline Phosphatase: 89 IU/L (ref 44–121)
BUN/Creatinine Ratio: 17 (ref 12–28)
BUN: 17 mg/dL (ref 8–27)
Bilirubin Total: <0.2 mg/dL (ref 0.0–1.2)
CO2: 21 mmol/L (ref 20–29)
Calcium: 9.4 mg/dL (ref 8.7–10.3)
Chloride: 107 mmol/L — ABNORMAL HIGH (ref 96–106)
Creatinine, Ser: 1.02 mg/dL — ABNORMAL HIGH (ref 0.57–1.00)
Globulin, Total: 2.6 g/dL (ref 1.5–4.5)
Glucose: 116 mg/dL — ABNORMAL HIGH (ref 65–99)
Potassium: 4.8 mmol/L (ref 3.5–5.2)
Sodium: 147 mmol/L — ABNORMAL HIGH (ref 134–144)
Total Protein: 6.9 g/dL (ref 6.0–8.5)
eGFR: 58 mL/min/1.73 — ABNORMAL LOW

## 2020-11-30 LAB — LIPID PANEL W/O CHOL/HDL RATIO
Cholesterol, Total: 131 mg/dL (ref 100–199)
HDL: 53 mg/dL (ref >39)
LDL Chol Calc (NIH): 59 mg/dL (ref 0–99)
Triglycerides: 100 mg/dL (ref 0–149)
VLDL Cholesterol Cal: 19 mg/dL (ref 5–40)

## 2020-11-30 LAB — TSH: TSH: 2.18 u[IU]/mL (ref 0.450–4.500)

## 2020-11-30 NOTE — Telephone Encounter (Signed)
Teresa [name] with [facility] is calling and pt needs a refill on one touch testing strips, metformin 500 mg, rosuvastatin 10 mg and lisinopril 40 mg 90 day supply with refills. Please send electronic or fax to [phone]

## 2020-12-01 ENCOUNTER — Telehealth: Payer: Self-pay | Admitting: General Practice

## 2020-12-01 ENCOUNTER — Telehealth: Payer: Self-pay

## 2020-12-01 MED ORDER — ROSUVASTATIN CALCIUM 10 MG PO TABS: 10.0000 mg | ORAL_TABLET | Freq: Every day | ORAL | 1 refills | Status: DC

## 2020-12-01 MED ORDER — METFORMIN HCL ER 500 MG PO TB24: ORAL_TABLET | ORAL | 1 refills | Status: DC

## 2020-12-01 MED ORDER — CLOPIDOGREL BISULFATE 75 MG PO TABS: 75.0000 mg | ORAL_TABLET | Freq: Every day | ORAL | 3 refills | Status: DC

## 2020-12-01 MED ORDER — GLUCOSE BLOOD VI STRP: 1.0000 | ORAL_STRIP | Freq: Two times a day (BID) | 12 refills | Status: DC

## 2020-12-01 MED ORDER — LISINOPRIL 40 MG PO TABS: 40.0000 mg | ORAL_TABLET | Freq: Every day | ORAL | 1 refills | Status: DC

## 2020-12-01 NOTE — Telephone Encounter (Signed)
  Chronic Care Management   Outreach Note  12/01/2020 Name: Teresa [name] MRN: [id number] DOB: 05-24-1946  Referred by: [name], DO Reason for referral : Appointment (RNCM: Follow up for Chronic Disease Management and Care Coordination Needs)   An unsuccessful telephone outreach was attempted today. The patient was referred to the case management team for assistance with care management and care coordination.   Follow Up Plan: A HIPAA compliant phone message was left for the patient providing contact information and requesting a return call.   Noreene Larsson RN, MSN, [facility] Mobile: [phone]

## 2020-12-04 ENCOUNTER — Other Ambulatory Visit: Payer: Self-pay | Admitting: Family Medicine

## 2020-12-04 NOTE — Telephone Encounter (Signed)
Patient has been rescheduled.

## 2020-12-04 NOTE — Telephone Encounter (Signed)
Requested Prescriptions  Pending Prescriptions Disp Refills  . levothyroxine (SYNTHROID) 50 MCG tablet [Pharmacy Med Name: LEVOTHYROXIN TAB 50MCG] 90 tablet 3    Sig: TAKE 1 TABLET DAILY     Endocrinology:  Hypothyroid Agents Failed - 12/04/2020 12:30 PM      Failed - TSH needs to be rechecked within 3 months after an abnormal result. Refill until TSH is due.      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

## 2020-12-05 ENCOUNTER — Telehealth: Payer: Self-pay

## 2020-12-05 NOTE — Telephone Encounter (Signed)
activities as tolerated 

## 2020-12-06 NOTE — Telephone Encounter (Signed)
error 

## 2020-12-13 ENCOUNTER — Inpatient Hospital Stay: Admission: RE | Admit: 2020-12-13 | Payer: Medicare HMO | Source: Ambulatory Visit

## 2020-12-19 ENCOUNTER — Ambulatory Visit (INDEPENDENT_AMBULATORY_CARE_PROVIDER_SITE_OTHER): Payer: Medicare HMO | Admitting: Family Medicine

## 2020-12-19 ENCOUNTER — Other Ambulatory Visit: Payer: Self-pay

## 2020-12-19 ENCOUNTER — Encounter: Payer: Self-pay | Admitting: Family Medicine

## 2020-12-19 VITALS — BP 126/62 | HR 43 | Temp 97.7°F | Wt 165.0 lb

## 2020-12-19 DIAGNOSIS — I129 Hypertensive chronic kidney disease with stage 1 through stage 4 chronic kidney disease, or unspecified chronic kidney disease: Secondary | ICD-10-CM | POA: Diagnosis not present

## 2020-12-19 DIAGNOSIS — N1831 Chronic kidney disease, stage 3a: Secondary | ICD-10-CM | POA: Diagnosis not present

## 2020-12-19 MED ORDER — CHLORTHALIDONE 25 MG PO TABS: 25.0000 mg | ORAL_TABLET | Freq: Every day | ORAL | 1 refills | Status: DC

## 2020-12-19 NOTE — Assessment & Plan Note (Signed)
Under good control on current regimen. Continue current regimen. Continue to monitor. Call with any concerns. Refills given. Labs drawn today.   

## 2020-12-19 NOTE — Progress Notes (Signed)
BP 126/62 (BP Location: Left Arm, Patient Position: Sitting, Cuff Size: Normal)   Pulse (!) 43   Temp 97.7 F (36.5 C)   Wt 165 lb (74.8 kg)   LMP  (LMP Unknown)   SpO2 99%   BMI 31.19 kg/m    Subjective:    Patient ID: Teresa [name], female    DOB: [birth year], 75 y.o.   MRN: [id number]  HPI: Teresa [name] is a 75 y.o. female  Chief Complaint  Patient presents with  . Hypertension   HYPERTENSION Hypertension status: controlled  Satisfied with current treatment? yes Duration of hypertension: chronic BP monitoring frequency:  a few times a week BP range: 120s/70s BP medication side effects:  no Medication compliance: excellent compliance Previous BP meds: chlorthalidone, lisinopril Aspirin: no Recurrent headaches: no Visual changes: no Palpitations: no Dyspnea: no Chest pain: no Lower extremity edema: no Dizzy/lightheaded: no   Relevant past medical, surgical, family and social history reviewed and updated as indicated. Interim medical history since our last visit reviewed. Allergies and medications reviewed and updated.  Review of Systems  Constitutional: Negative.   Respiratory: Negative.   Cardiovascular: Negative.   Gastrointestinal: Negative.   Musculoskeletal: Negative.   Psychiatric/Behavioral: Negative.     Per HPI unless specifically indicated above     Objective:    BP 126/62 (BP Location: Left Arm, Patient Position: Sitting, Cuff Size: Normal)   Pulse (!) 43   Temp 97.7 F (36.5 C)   Wt 165 lb (74.8 kg)   LMP  (LMP Unknown)   SpO2 99%   BMI 31.19 kg/m   Wt Readings from Last 3 Encounters:  12/19/20 165 lb (74.8 kg)  11/28/20 169 lb 6.4 oz (76.8 kg)  11/20/20 168 lb (76.2 kg)    Physical Exam Vitals and nursing note reviewed.  Constitutional:      General: She is not in acute distress.    Appearance: Normal appearance. She is not ill-appearing, toxic-appearing or diaphoretic.  HENT:     Head: Normocephalic and atraumatic.      Right Ear: External ear normal.     Left Ear: External ear normal.     Nose: Nose normal.     Mouth/Throat:     Mouth: Mucous membranes are moist.     Pharynx: Oropharynx is clear.  Eyes:     General: No scleral icterus.       Right eye: No discharge.        Left eye: No discharge.     Extraocular Movements: Extraocular movements intact.     Conjunctiva/sclera: Conjunctivae normal.     Pupils: Pupils are equal, round, and reactive to light.  Cardiovascular:     Rate and Rhythm: Normal rate and regular rhythm.     Pulses: Normal pulses.     Heart sounds: Normal heart sounds. No murmur heard. No friction rub. No gallop.   Pulmonary:     Effort: Pulmonary effort is normal. No respiratory distress.     Breath sounds: Normal breath sounds. No stridor. No wheezing, rhonchi or rales.  Chest:     Chest wall: No tenderness.  Musculoskeletal:        General: Normal range of motion.     Cervical back: Normal range of motion and neck supple.  Skin:    General: Skin is warm and dry.     Capillary Refill: Capillary refill takes less than 2 seconds.     Coloration: Skin is not jaundiced or pale.  Findings: No bruising, erythema, lesion or rash.  Neurological:     General: No focal deficit present.     Mental Status: She is alert and oriented to person, place, and time. Mental status is at baseline.  Psychiatric:        Mood and Affect: Mood normal.        Behavior: Behavior normal.        Thought Content: Thought content normal.        Judgment: Judgment normal.     Results for orders placed or performed in visit on 11/28/20  Comprehensive metabolic panel  Result Value Ref Range   Glucose 116 (H) 65 - 99 mg/dL   BUN 17 8 - 27 mg/dL   Creatinine, Ser 1.02 (H) 0.57 - 1.00 mg/dL   eGFR 58 (L) >59 mL/min/1.73   BUN/Creatinine Ratio 17 12 - 28   Sodium 147 (H) 134 - 144 mmol/L   Potassium 4.8 3.5 - 5.2 mmol/L   Chloride 107 (H) 96 - 106 mmol/L   CO2 21 20 - 29 mmol/L    Calcium 9.4 8.7 - 10.3 mg/dL   Total Protein 6.9 6.0 - 8.5 g/dL   Albumin 4.3 3.7 - 4.7 g/dL   Globulin, Total 2.6 1.5 - 4.5 g/dL   Albumin/Globulin Ratio 1.7 1.2 - 2.2   Bilirubin Total <0.2 0.0 - 1.2 mg/dL   Alkaline Phosphatase 89 44 - 121 IU/L   AST 13 0 - 40 IU/L   ALT 12 0 - 32 IU/L  Bayer DCA Hb A1c Waived  Result Value Ref Range   HB A1C (BAYER DCA - WAIVED) 6.4 <7.0 %  Lipid Panel w/o Chol/HDL Ratio  Result Value Ref Range   Cholesterol, Total 131 100 - 199 mg/dL   Triglycerides 100 0 - 149 mg/dL   HDL 53 >39 mg/dL   VLDL Cholesterol Cal 19 5 - 40 mg/dL   LDL Chol Calc (NIH) 59 0 - 99 mg/dL  TSH  Result Value Ref Range   TSH 2.180 0.450 - 4.500 uIU/mL      Assessment & Plan:   Problem List Items Addressed This Visit      Genitourinary   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 Continue current regimen. Continue to monitor. Call with any concerns. Refills given. Labs drawn today.        Relevant Orders   Basic metabolic panel       Follow up plan: Return in about 5 months (around 05/21/2021) for physical.

## 2020-12-20 LAB — BASIC METABOLIC PANEL
BUN/Creatinine Ratio: 25 (ref 12–28)
BUN: 24 mg/dL (ref 8–27)
CO2: 20 mmol/L (ref 20–29)
Calcium: 9.5 mg/dL (ref 8.7–10.3)
Chloride: 108 mmol/L — ABNORMAL HIGH (ref 96–106)
Creatinine, Ser: 0.95 mg/dL (ref 0.57–1.00)
Glucose: 100 mg/dL — ABNORMAL HIGH (ref 65–99)
Potassium: 4.5 mmol/L (ref 3.5–5.2)
Sodium: 145 mmol/L — ABNORMAL HIGH (ref 134–144)
eGFR: 63 mL/min/{1.73_m2} (ref >59)

## 2020-12-29 DIAGNOSIS — M48062 Spinal stenosis, lumbar region with neurogenic claudication: Secondary | ICD-10-CM | POA: Diagnosis not present

## 2020-12-29 DIAGNOSIS — M5416 Radiculopathy, lumbar region: Secondary | ICD-10-CM | POA: Diagnosis not present

## 2021-01-03 ENCOUNTER — Ambulatory Visit: Payer: Medicare HMO | Admitting: Gastroenterology

## 2021-01-04 ENCOUNTER — Telehealth: Payer: Self-pay

## 2021-01-04 NOTE — Telephone Encounter (Signed)
Patient left a voicemail stating she is wanting to scheduled her CT colonoscopy

## 2021-01-05 ENCOUNTER — Other Ambulatory Visit: Payer: Self-pay

## 2021-01-05 DIAGNOSIS — Z1211 Encounter for screening for malignant neoplasm of colon: Secondary | ICD-10-CM

## 2021-01-05 NOTE — Telephone Encounter (Signed)
Left detailed msg on VM per HIPAA we will have to complete another authorization with insurance and receive an approval before they will reschedule CT

## 2021-01-10 ENCOUNTER — Telehealth: Payer: Medicare HMO

## 2021-01-10 ENCOUNTER — Telehealth: Payer: Self-pay | Admitting: General Practice

## 2021-01-10 NOTE — Telephone Encounter (Signed)
  Care Management   Follow Up Note   01/10/2021 Name: Teresa [name] MRN: [id number] DOB: 12-14-1945   Referred by: [name], DO Reason for referral : Chronic Care Management (RNCM: Follow up for Chronic Disease Management and Care Coordination Needs)   An unsuccessful telephone outreach was attempted today. The patient was referred to the case management team for assistance with care management and care coordination. Called cell number and house number using jabber.   Follow Up Plan: A HIPPA compliant phone message was left for the patient providing contact information and requesting a return call.    Noreene Larsson RN, MSN, [facility] Mobile: [phone]

## 2021-01-16 ENCOUNTER — Telehealth: Payer: Self-pay | Admitting: Gastroenterology

## 2021-01-16 NOTE — Telephone Encounter (Signed)
Patient calling about rescheduling her CT colonoscopy and the documentation from Dr. Bonna Gains so it can be approved through her insurance.

## 2021-01-16 NOTE — Telephone Encounter (Signed)
Pt is aware we were waiting on pre cert which was denied for the second time... there is a peer to peer scheduled for 01/17/2021 and we will call her once we have an answer

## 2021-01-17 ENCOUNTER — Other Ambulatory Visit: Payer: Self-pay | Admitting: Gastroenterology

## 2021-01-17 DIAGNOSIS — K635 Polyp of colon: Secondary | ICD-10-CM

## 2021-01-19 ENCOUNTER — Telehealth: Payer: Self-pay

## 2021-01-19 NOTE — Chronic Care Management (AMB) (Signed)
  Care Management   Note  01/19/2021 Name: Teresa [name] MRN: [id number] DOB: 1946-02-19  [name] is a 75 y.o. year old female who is a primary care patient of Belkis, Norbeck, DO and is actively engaged with the care management team. I reached out to [name] by phone today to assist with re-scheduling a follow up visit with the RN Case Manager  Follow up plan: Unsuccessful telephone outreach attempt made. A HIPAA compliant phone message was left for the patient providing contact information and requesting a return call.  The care management team will reach out to the patient again over the next 7 days.  If patient returns call to provider office, please advise to call [place] at [place], [place], [place], [place] [address] Direct Dial: [phone] [name]: [email or web address]

## 2021-01-22 DIAGNOSIS — R32 Unspecified urinary incontinence: Secondary | ICD-10-CM | POA: Diagnosis not present

## 2021-01-22 DIAGNOSIS — Z7902 Long term (current) use of antithrombotics/antiplatelets: Secondary | ICD-10-CM | POA: Diagnosis not present

## 2021-01-22 DIAGNOSIS — I951 Orthostatic hypotension: Secondary | ICD-10-CM | POA: Diagnosis not present

## 2021-01-22 DIAGNOSIS — Z7984 Long term (current) use of oral hypoglycemic drugs: Secondary | ICD-10-CM | POA: Diagnosis not present

## 2021-01-22 DIAGNOSIS — Z7982 Long term (current) use of aspirin: Secondary | ICD-10-CM | POA: Diagnosis not present

## 2021-01-22 DIAGNOSIS — N189 Chronic kidney disease, unspecified: Secondary | ICD-10-CM | POA: Diagnosis not present

## 2021-01-22 DIAGNOSIS — E785 Hyperlipidemia, unspecified: Secondary | ICD-10-CM | POA: Diagnosis not present

## 2021-01-22 DIAGNOSIS — I129 Hypertensive chronic kidney disease with stage 1 through stage 4 chronic kidney disease, or unspecified chronic kidney disease: Secondary | ICD-10-CM | POA: Diagnosis not present

## 2021-01-22 DIAGNOSIS — E1122 Type 2 diabetes mellitus with diabetic chronic kidney disease: Secondary | ICD-10-CM | POA: Diagnosis not present

## 2021-01-22 DIAGNOSIS — E039 Hypothyroidism, unspecified: Secondary | ICD-10-CM | POA: Diagnosis not present

## 2021-01-25 ENCOUNTER — Telehealth: Payer: Self-pay | Admitting: Gastroenterology

## 2021-01-25 ENCOUNTER — Other Ambulatory Visit: Payer: Self-pay

## 2021-01-25 NOTE — Telephone Encounter (Signed)
Left vm informing pt we have resubmitted request for the CT scan diagnostic. We will contact her once a decision is rendered.

## 2021-01-25 NOTE — Telephone Encounter (Signed)
Patient called, she received a letter of denial on there CT colon. What is the next step to get this approved? Please call patient.

## 2021-02-09 ENCOUNTER — Telehealth: Payer: Self-pay | Admitting: *Deleted

## 2021-02-09 NOTE — Telephone Encounter (Signed)
Patient called and said her CT colon was approved by insurance and is ready to schedule.

## 2021-02-14 NOTE — Chronic Care Management (AMB) (Signed)
  Care Management   Note  02/14/2021 Name: [name] MRN: [id number] DOB: February 27, 1946  Teresa [name] is a 75 y.o. year old female who is a primary care patient of Rashmi, Tallent, DO and is actively engaged with the care management team. I reached out to [name] by phone today to assist with re-scheduling a follow up visit with the RN Case Manager  Follow up plan: Unsuccessful telephone outreach attempt made. A HIPAA compliant phone message was left for the patient providing contact information and requesting a return call.  The care management team will reach out to the patient again over the next 7 days.  If patient returns call to provider office, please advise to call [place]  at [place], [place], [place], [place] [address] Direct Dial: [phone] [email or web address]

## 2021-02-15 NOTE — Telephone Encounter (Signed)
Pt is aware and information with address and number given to pt

## 2021-02-15 NOTE — Telephone Encounter (Signed)
[facility] scheduled for Aug 11 9am--arrive 8:40am, pick up prep 4 days prior  [place] [phone]  Left detailed msg on VM per HIPAA with appt info, time and address  Mychart msg also sent

## 2021-02-21 NOTE — Chronic Care Management (AMB) (Signed)
  Care Management   Note  02/21/2021 Name: ADAMARIS KING MRN: [id number] DOB: 23-Apr-1946  ARTHA STAVROS is a 75 y.o. year old female who is a primary care patient of Raelyn, Racette, DO and is actively engaged with the care management team. I reached out to [name] by phone today to assist with re-scheduling a follow up visit with the RN Case Manager  Follow up plan: Telephone appointment with care management team member scheduled for:03/16/2021  Noreene Larsson, [place], [place] [facility]  [place], [place] [address] Direct Dial: [phone] [email or web address]: [email or web address]

## 2021-02-21 NOTE — Telephone Encounter (Signed)
3rd unsuccessful outreach  

## 2021-02-21 NOTE — Chronic Care Management (AMB) (Signed)
  Care Management   Note  02/21/2021 Name: [name] MRN: [id number] DOB: [birth year]  Teresa [name] is a 75 y.o. year old female who is a primary care patient of Rozanne, Heumann, DO and is actively engaged with the care management team. I reached out to [name] by phone today to assist with re-scheduling a follow up visit with the RN Case Manager  Follow up plan: Unable to make contact on outreach attempts x 3. PCP [name], DO notified via routed documentation in medical record.   Noreene Larsson, [place], [place], [place] [address] Direct Dial: [phone] [email or web address]: [email or web address]

## 2021-03-01 ENCOUNTER — Ambulatory Visit: Payer: Medicare HMO | Admitting: Gastroenterology

## 2021-03-01 ENCOUNTER — Encounter: Payer: Self-pay | Admitting: *Deleted

## 2021-03-02 ENCOUNTER — Telehealth: Payer: Self-pay

## 2021-03-02 NOTE — Telephone Encounter (Signed)
Copied from [place] [id number]. Topic: Appointment Scheduling - Scheduling Inquiry for Clinic >> Mar 02, 2021 11:29 AM Greggory Keen D wrote: Reason for CRM: Pt needs to rscd her [place] she has another appt conflict that day/

## 2021-03-09 ENCOUNTER — Encounter: Payer: Self-pay | Admitting: Family Medicine

## 2021-03-12 ENCOUNTER — Ambulatory Visit: Payer: Medicare HMO | Admitting: Gastroenterology

## 2021-03-12 ENCOUNTER — Other Ambulatory Visit: Payer: Self-pay

## 2021-03-12 ENCOUNTER — Ambulatory Visit: Payer: Self-pay

## 2021-03-12 VITALS — BP 152/61 | HR 66 | Temp 98.2°F | Ht 61.0 in | Wt 169.0 lb

## 2021-03-12 DIAGNOSIS — K5900 Constipation, unspecified: Secondary | ICD-10-CM | POA: Diagnosis not present

## 2021-03-12 DIAGNOSIS — D649 Anemia, unspecified: Secondary | ICD-10-CM

## 2021-03-12 NOTE — Progress Notes (Signed)
Melodie Bouillon, MD [address]  [address]  [place], [place] [address]  Main: [phone]  Fax: [phone]   Primary Care Physician: Dorcas Carrow, DO   CC: Constipation  HPI: MAYLEY LISH is a 75 y.o. female here reporting constipation.  States has a bowel movement every day, but it is hard to go sometimes and she has to strain.  No blood in bowel movements.  No nausea or vomiting or abdominal pain.  No weight loss.  Has Metamucil at home but has not started taking it.  CT colonoscopy has been scheduled for March 22, 2021 and patient is aware of this appointment and already has the prep related to this to take before the study.  Patient reports taking iron supplements daily but does not know why she has anemia.  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  Prior to that she had a colonoscopy in 2015 with Dr. Marva Panda that was incomplete, and in 2001 colonoscopy with 1, 54mm polyp removed.  Pathology report not available.  CT colonoscopy was ordered, but patient was unable to get it done as patient states it was not approved by her insurance.  The patient denies abdominal or flank pain, anorexia, nausea or vomiting, dysphagia, change in bowel habits or black or bloody stools or weight loss.  No family history of colon cancer   ROS: All ROS reviewed and negative except as per HPI   Past Medical History:  Diagnosis Date   Diabetes mellitus without complication (HCC)    Heart murmur    Hyperlipidemia    Hypertension    Hypothyroidism    Lichen sclerosus    Obesity    Osteopenia    Stroke [facility])    Thyroid disease     Past Surgical History:  Procedure Laterality Date   carpel tunnel     COLONOSCOPY WITH PROPOFOL N/A 08/30/2020   Procedure: COLONOSCOPY WITH PROPOFOL;   Surgeon: Pasty Spillers, MD;  Location: [facility] ENDOSCOPY;  Service: Gastroenterology;  Laterality: N/A;   ROTATOR CUFF REPAIR Left    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      Prior to Admission medications   Medication Sig Start Date End Date Taking? Authorizing Provider  ASPIRIN 81 PO Take by mouth.   Yes [provider]  blood glucose meter kit and supplies Dispense based on patient and insurance preference. Use up to four times daily as directed. (FOR ICD-9 250.00, 250.01). 12/08/17  Yes Gabriel Cirri, NP  chlorthalidone (HYGROTON) 25 MG tablet Take 1 tablet (25 mg total) by mouth daily. 12/19/20  Yes Norem, Megan P, DO  clobetasol ointment (TEMOVATE) 0.05 % Apply topically 2 (two) times daily. 09/23/20  Yes Colantuono, Megan P, DO  clopidogrel (PLAVIX) 75 MG tablet Take 1 tablet (75 mg total) by mouth daily. 12/01/20  Yes Hilger, Megan P, DO  cyanocobalamin 100 MCG tablet Take 100 mcg by mouth daily.   Yes [provider]  ferrous sulfate 325 (65 FE) MG tablet Take 1 tablet (325 mg total) by mouth 2 (two) times daily with a meal. Patient taking differently: Take 325 mg by mouth daily with breakfast. 12/06/19  Yes Coglianese, Megan P, DO  folic acid (FOLVITE) 800 MCG tablet Take 400 mcg by mouth daily.   Yes [provider]  glucose blood test strip  1 each by Other route 2 (two) times daily. Use as instructed, Dx: E11.22 12/01/20  Yes Sass, Megan P, DO  Lancets MISC 1 each by Does not apply route daily. 02/11/20  Yes Markus, Megan P, DO  levothyroxine (SYNTHROID) 50 MCG tablet TAKE 1 TABLET DAILY 12/04/20  Yes Krammes, Megan P, DO  lisinopril (ZESTRIL) 40 MG tablet Take 1 tablet (40 mg total) by mouth daily. 12/01/20  Yes Chrisley, Megan P, DO  metFORMIN (GLUCOPHAGE-XR) 500 MG 24 hr tablet TAKE 4 TABLETS(2,$RemoveBeforeDE'000MG'bdpgxIscznOREUJ$      TOTAL) DAILY WITH BREAKFAST 12/01/20  Yes Vonruden, Megan P, DO  rosuvastatin (CRESTOR) 10 MG tablet Take 1 tablet (10 mg total) by mouth daily. 12/01/20  Yes [name], DO    Family History  Problem Relation Age of Onset   Hypertension Mother    Thyroid disease Mother    Glaucoma Mother    Coronary artery disease Mother    Stroke Father    Diabetes Brother    Hypertension Maternal Grandfather    Breast cancer Neg Hx      Social History   Tobacco Use   Smoking status: Former    Packs/day: 2.00    Types: Cigarettes    Quit date: 01/1965    Years since quitting: 56.2   Smokeless tobacco: Never  Vaping Use   Vaping Use: Never used  Substance Use Topics   Alcohol use: No    Alcohol/week: 0.0 standard drinks   Drug use: No    Allergies as of 03/12/2021 - Review Complete 03/12/2021  Allergen Reaction Noted   Amlodipine Other (See Comments) 11/28/2020   Atorvastatin  11/28/2017   Evista [raloxifene] Nausea Only 04/03/2015    Physical Examination:  Constitutional: General:   Alert,  Well-developed, well-nourished, pleasant and cooperative in NAD BP (!) 152/61   Pulse 66   Temp 98.2 F (36.8 C) (Oral)   Wt 169 lb (76.7 kg)   LMP  (LMP Unknown)   BMI 31.95 kg/m   Respiratory: Normal respiratory effort  Gastrointestinal:  Soft, non-tender and non-distended without masses, hepatosplenomegaly or hernias noted.  No guarding or rebound tenderness.     Cardiac: No clubbing or edema.  No cyanosis. Normal posterior tibial pedal pulses noted.  Psych:  Alert and cooperative. Normal mood and affect.  Musculoskeletal:  Normal gait. Head normocephalic, atraumatic. Symmetrical without gross deformities. 5/5 Lower extremity strength bilaterally.  Skin: Warm. Intact without significant lesions or rashes. No jaundice.  Neck: Supple, trachea midline  Lymph: No cervical lymphadenopathy  Psych:  Alert and oriented x3, Alert and cooperative. Normal mood and affect.  Labs: CMP     Component Value Date/Time   NA 145 (H) 12/19/2020 0909   K 4.5 12/19/2020 0909   CL 108 (H) 12/19/2020 0909   CO2 20 12/19/2020 0909   GLUCOSE 100 (H)  12/19/2020 0909   GLUCOSE 136 (H) 09/27/2017 0409   BUN 24 12/19/2020 0909   CREATININE 0.95 12/19/2020 0909   CALCIUM 9.5 12/19/2020 0909   PROT 6.9 11/28/2020 1602   ALBUMIN 4.3 11/28/2020 1602   AST 13 11/28/2020 1602   ALT 12 11/28/2020 1602   ALKPHOS 89 11/28/2020 1602   BILITOT <0.2 11/28/2020 1602   GFRNONAA 68 05/23/2020 0819   GFRAA 79 05/23/2020 0819   Lab Results  Component Value Date   WBC 7.3 05/23/2020   HGB 9.2 (L) 05/23/2020   HCT 27.8 (L) 05/23/2020   MCV 94 05/23/2020   PLT 214 05/23/2020  Imaging Studies:   Assessment and Plan:   TAKISHA PELLE is a 75 y.o. y/o female here for follow-up on is reporting constipation  High-fiber diet Patient has Metamucil at home but has not started taking it.  Advised to start Metamucil at least once a day and titrate up if needed to 2-3 times a day, but no more than what the bottle recommends. She is having a bowel movement every day but does have some straining with it.  If symptoms no better, patient advised to call us back  Review of her previous records reveals that patient has had chronic anemia with unclear etiology.  This has been normocytic.  Hemoglobin 9.2 most recent in October 2021, but has been 10.5 dating back to October 2020  Ferritin has been normal ranging from 39-44 in 2019 in 2018 Iron saturation was low at 13 in 2019, and was normal at 21, 1 month later  Given her unexplained anemia, I will recheck CBC, and iron panel  The importance of getting her CT colonoscopy given her underlying anemia was discussed with her in detail and she verbalized understanding and plans on keeping the appointment that has already been scheduled for this.  If CT colonoscopy is unrevealing and if she does have iron deficiency anemia, we may need to consider EGD  We may need to also consider hematology work-up if she continues to have anemia   Dr [name]

## 2021-03-13 LAB — FERRITIN: Ferritin: 77 ng/mL (ref 15–150)

## 2021-03-13 LAB — CBC
Hematocrit: 32.4 % — ABNORMAL LOW (ref 34.0–46.6)
Hemoglobin: 10.7 g/dL — ABNORMAL LOW (ref 11.1–15.9)
MCH: 31 pg (ref 26.6–33.0)
MCHC: 33 g/dL (ref 31.5–35.7)
MCV: 94 fL (ref 79–97)
Platelets: 244 10*3/uL (ref 150–450)
RBC: 3.45 x10E6/uL — ABNORMAL LOW (ref 3.77–5.28)
RDW: 12.8 % (ref 11.7–15.4)
WBC: 8 10*3/uL (ref 3.4–10.8)

## 2021-03-13 LAB — IRON AND TIBC
Iron Saturation: 19 % (ref 15–55)
Iron: 49 ug/dL (ref 27–139)
Total Iron Binding Capacity: 259 ug/dL (ref 250–450)
UIBC: 210 ug/dL (ref 118–369)

## 2021-03-13 NOTE — Addendum Note (Signed)
Addended by: Lurlean Nanny on: 03/13/2021 09:56 AM   Modules accepted: Orders

## 2021-03-16 ENCOUNTER — Telehealth: Payer: Self-pay | Admitting: General Practice

## 2021-03-16 ENCOUNTER — Telehealth: Payer: Medicare HMO

## 2021-03-16 NOTE — Telephone Encounter (Signed)
  Care Management   Follow Up Note   03/16/2021 Name: Teresa [name] MRN: [id number] DOB: 05/09/1946   Referred by: [name], DO Reason for referral : Chronic Care Management (RNCM: Follow up for Chronic Disease Management and Care Coordination Needs)   An unsuccessful telephone outreach was attempted today. The patient was referred to the case management team for assistance with care management and care coordination.   Follow Up Plan: A HIPPA compliant phone message was left for the patient providing contact information and requesting a return call.   Noreene Larsson RN, MSN, [facility] Mobile: [phone]

## 2021-03-19 ENCOUNTER — Ambulatory Visit (INDEPENDENT_AMBULATORY_CARE_PROVIDER_SITE_OTHER): Payer: Medicare HMO

## 2021-03-19 VITALS — Ht 62.0 in | Wt 165.0 lb

## 2021-03-19 DIAGNOSIS — Z0000 Encounter for general adult medical examination without abnormal findings: Secondary | ICD-10-CM

## 2021-03-19 NOTE — Patient Instructions (Signed)
Teresa [name] , Thank you for taking time to come for your Medicare Wellness Visit. I appreciate your ongoing commitment to your health goals. Please review the following plan we discussed and let me know if I can assist you in the future.   Screening recommendations/referrals: Colonoscopy: scheduled 03/22/2021 Mammogram: completed 05/03/2020 Bone Density: completed 01/22/2016 Recommended yearly ophthalmology/optometry visit for glaucoma screening and checkup Recommended yearly dental visit for hygiene and checkup  Vaccinations: Influenza vaccine: due Pneumococcal vaccine: completed 10/14/2014 Tdap vaccine: completed 11/28/2020, due 11/29/2030 Shingles vaccine: decline   Covid-19:06/07/2020, 11/03/2019, 10/06/2019  Advanced directives: Advance directive discussed with you today.   Conditions/risks identified: none  Next appointment: Follow up in one year for your annual wellness visit    Preventive Care 65 Years and Older, Female Preventive care refers to lifestyle choices and visits with your health care provider that can promote health and wellness. What does preventive care include? A yearly physical exam. This is also called an annual well check. Dental exams once or twice a year. Routine eye exams. Ask your health care provider how often you should have your eyes checked. Personal lifestyle choices, including: Daily care of your teeth and gums. Regular physical activity. Eating a healthy diet. Avoiding tobacco and drug use. Limiting alcohol use. Practicing safe sex. Taking low-dose aspirin every day. Taking vitamin and mineral supplements as recommended by your health care provider. What happens during an annual well check? The services and screenings done by your health care provider during your annual well check will depend on your age, overall health, lifestyle risk factors, and family history of disease. Counseling  Your health care provider may ask you questions about  your: Alcohol use. Tobacco use. Drug use. Emotional well-being. Home and relationship well-being. Sexual activity. Eating habits. History of falls. Memory and ability to understand (cognition). Work and work Statistician. Reproductive health. Screening  You may have the following tests or measurements: Height, weight, and BMI. Blood pressure. Lipid and cholesterol levels. These may be checked every 5 years, or more frequently if you are over 4 years old. Skin check. Lung cancer screening. You may have this screening every year starting at age 6 if you have a 30-pack-year history of smoking and currently smoke or have quit within the past 15 years. Fecal occult blood test (FOBT) of the stool. You may have this test every year starting at age 58. Flexible sigmoidoscopy or colonoscopy. You may have a sigmoidoscopy every 5 years or a colonoscopy every 10 years starting at age 26. Hepatitis C blood test. Hepatitis B blood test. Sexually transmitted disease (STD) testing. Diabetes screening. This is done by checking your blood sugar (glucose) after you have not eaten for a while (fasting). You may have this done every 1-3 years. Bone density scan. This is done to screen for osteoporosis. You may have this done starting at age 60. Mammogram. This may be done every 1-2 years. Talk to your health care provider about how often you should have regular mammograms. Talk with your health care provider about your test results, treatment options, and if necessary, the need for more tests. Vaccines  Your health care provider may recommend certain vaccines, such as: Influenza vaccine. This is recommended every year. Tetanus, diphtheria, and acellular pertussis (Tdap, Td) vaccine. You may need a Td booster every 10 years. Zoster vaccine. You may need this after age 45. Pneumococcal 13-valent conjugate (PCV13) vaccine. One dose is recommended after age 48. Pneumococcal polysaccharide (PPSV23) vaccine.  One dose is  recommended after age 24. Talk to your health care provider about which screenings and vaccines you need and how often you need them. This information is not intended to replace advice given to you by your health care provider. Make sure you discuss any questions you have with your health care provider. Document Released: 08/25/2015 Document Revised: 04/17/2016 Document Reviewed: 05/30/2015 Elsevier Interactive Patient Education  2017 [place] Prevention in the Home Falls can cause injuries. They can happen to people of all ages. There are many things you can do to make your home safe and to help prevent falls. What can I do on the outside of my home? Regularly fix the edges of walkways and driveways and fix any cracks. Remove anything that might make you trip as you walk through a door, such as a raised step or threshold. Trim any bushes or trees on the path to your home. Use bright outdoor lighting. Clear any walking paths of anything that might make someone trip, such as rocks or tools. Regularly check to see if handrails are loose or broken. Make sure that both sides of any steps have handrails. Any raised decks and porches should have guardrails on the edges. Have any leaves, snow, or ice cleared regularly. Use sand or salt on walking paths during winter. Clean up any spills in your garage right away. This includes oil or grease spills. What can I do in the bathroom? Use night lights. Install grab bars by the toilet and in the tub and shower. Do not use towel bars as grab bars. Use non-skid mats or decals in the tub or shower. If you need to sit down in the shower, use a plastic, non-slip stool. Keep the floor dry. Clean up any water that spills on the floor as soon as it happens. Remove soap buildup in the tub or shower regularly. Attach bath mats securely with double-sided non-slip rug tape. Do not have throw rugs and other things on the floor that can make  you trip. What can I do in the bedroom? Use night lights. Make sure that you have a light by your bed that is easy to reach. Do not use any sheets or blankets that are too big for your bed. They should not hang down onto the floor. Have a firm chair that has side arms. You can use this for support while you get dressed. Do not have throw rugs and other things on the floor that can make you trip. What can I do in the kitchen? Clean up any spills right away. Avoid walking on wet floors. Keep items that you use a lot in easy-to-reach places. If you need to reach something above you, use a strong step stool that has a grab bar. Keep electrical cords out of the way. Do not use floor polish or wax that makes floors slippery. If you must use wax, use non-skid floor wax. Do not have throw rugs and other things on the floor that can make you trip. What can I do with my stairs? Do not leave any items on the stairs. Make sure that there are handrails on both sides of the stairs and use them. Fix handrails that are broken or loose. Make sure that handrails are as long as the stairways. Check any carpeting to make sure that it is firmly attached to the stairs. Fix any carpet that is loose or worn. Avoid having throw rugs at the top or bottom of the stairs. If you  do have throw rugs, attach them to the floor with carpet tape. Make sure that you have a light switch at the top of the stairs and the bottom of the stairs. If you do not have them, ask someone to add them for you. What else can I do to help prevent falls? Wear shoes that: Do not have high heels. Have rubber bottoms. Are comfortable and fit you well. Are closed at the toe. Do not wear sandals. If you use a stepladder: Make sure that it is fully opened. Do not climb a closed stepladder. Make sure that both sides of the stepladder are locked into place. Ask someone to hold it for you, if possible. Clearly mark and make sure that you can  see: Any grab bars or handrails. First and last steps. Where the edge of each step is. Use tools that help you move around (mobility aids) if they are needed. These include: Canes. Walkers. Scooters. Crutches. Turn on the lights when you go into a dark area. Replace any light bulbs as soon as they burn out. Set up your furniture so you have a clear path. Avoid moving your furniture around. If any of your floors are uneven, fix them. If there are any pets around you, be aware of where they are. Review your medicines with your doctor. Some medicines can make you feel dizzy. This can increase your chance of falling. Ask your doctor what other things that you can do to help prevent falls. This information is not intended to replace advice given to you by your health care provider. Make sure you discuss any questions you have with your health care provider. Document Released: 05/25/2009 Document Revised: 01/04/2016 Document Reviewed: 09/02/2014 Elsevier Interactive Patient Education  2017 [employer].

## 2021-03-19 NOTE — Progress Notes (Signed)
I connected with Teresa [name] today by telephone and verified that I am speaking with the correct person using two identifiers. Location patient: home Location provider: work Persons participating in the virtual visit: Teresa [name], [name] LPN.   I discussed the limitations, risks, security and privacy concerns of performing an evaluation and management service by telephone and the availability of in person appointments. I also discussed with the patient that there may be a patient responsible charge related to this service. The patient expressed understanding and verbally consented to this telephonic visit.    Interactive audio and video telecommunications were attempted between this provider and patient, however failed, due to patient having technical difficulties OR patient did not have access to video capability.  We continued and completed visit with audio only.     Vital signs may be patient reported or missing.  Subjective:   Teresa [name] is a 75 y.o. female who presents for Medicare Annual (Subsequent) preventive examination.  Review of Systems     Cardiac Risk Factors include: advanced age (>29men, >31 women);diabetes mellitus;dyslipidemia;hypertension;obesity (BMI >30kg/m2);sedentary lifestyle     Objective:    Today's Vitals   03/19/21 0941  Weight: 165 lb (74.8 kg)  Height: $Remove'5\' 2"'LuEcYLe$  (1.575 m)   Body mass index is 30.18 kg/m.  Advanced Directives 03/19/2021 08/30/2020 03/10/2020 02/17/2019 02/04/2018 09/27/2017 09/26/2017  Does Patient Have a Medical Advance Directive? No No No No No No No  Does patient want to make changes to medical advance directive? - - - - No - Patient declined - -  Would patient like information on creating a medical advance directive? - - - Yes (MAU/Ambulatory/Procedural Areas - Information given) - No - Patient declined No - Patient declined    Current Medications (verified) Outpatient Encounter Medications as of 03/19/2021  Medication  Sig   ASPIRIN 81 PO Take by mouth.   blood glucose meter kit and supplies Dispense based on patient and insurance preference. Use up to four times daily as directed. (FOR ICD-9 250.00, 250.01).   chlorthalidone (HYGROTON) 25 MG tablet Take 1 tablet (25 mg total) by mouth daily.   clobetasol ointment (TEMOVATE) 0.05 % Apply topically 2 (two) times daily.   clopidogrel (PLAVIX) 75 MG tablet Take 1 tablet (75 mg total) by mouth daily.   cyanocobalamin 100 MCG tablet Take 100 mcg by mouth daily.   ferrous sulfate 325 (65 FE) MG tablet Take 1 tablet (325 mg total) by mouth 2 (two) times daily with a meal. (Patient taking differently: Take 325 mg by mouth daily with breakfast.)   folic acid (FOLVITE) 564 MCG tablet Take 400 mcg by mouth daily.   glucose blood test strip 1 each by Other route 2 (two) times daily. Use as instructed, Dx: E11.22   Lancets MISC 1 each by Does not apply route daily.   levothyroxine (SYNTHROID) 50 MCG tablet TAKE 1 TABLET DAILY   lisinopril (ZESTRIL) 40 MG tablet Take 1 tablet (40 mg total) by mouth daily.   metFORMIN (GLUCOPHAGE-XR) 500 MG 24 hr tablet TAKE 4 TABLETS(2,$RemoveBeforeDE'000MG'hisDcuztfRmywuv$      TOTAL) DAILY WITH BREAKFAST   rosuvastatin (CRESTOR) 10 MG tablet Take 1 tablet (10 mg total) by mouth daily.   No facility-administered encounter medications on file as of 03/19/2021.    Allergies (verified) Amlodipine, Atorvastatin, and Evista [raloxifene]   History: Past Medical History:  Diagnosis Date   Diabetes mellitus without complication (HCC)    Heart murmur    Hyperlipidemia    Hypertension  Hypothyroidism    Lichen sclerosus    Obesity    Osteopenia    Stroke [facility])    Thyroid disease    Past Surgical History:  Procedure Laterality Date   carpel tunnel     COLONOSCOPY WITH PROPOFOL N/A 08/30/2020   Procedure: COLONOSCOPY WITH PROPOFOL;  Surgeon: Virgel Manifold, MD;  Location: [facility] ENDOSCOPY;  Service: Gastroenterology;  Laterality: N/A;   ROTATOR CUFF REPAIR Left     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     Family History  Problem Relation Age of Onset   Hypertension Mother    Thyroid disease Mother    Glaucoma Mother    Coronary artery disease Mother    Stroke Father    Diabetes Brother    Hypertension Maternal Grandfather    Breast cancer Neg Hx    Social History   Socioeconomic History   Marital status: Married    Spouse name: Not on file   Number of children: Not on file   Years of education: Not on file   Highest education level: High school graduate  Occupational History   Occupation: retired  Tobacco Use   Smoking status: Former    Packs/day: 2.00    Types: Cigarettes    Quit date: 01/1965    Years since quitting: 56.2   Smokeless tobacco: Never  Vaping Use   Vaping Use: Never used  Substance and Sexual Activity   Alcohol use: No    Alcohol/week: 0.0 standard drinks   Drug use: No   Sexual activity: Never  Other Topics Concern   Not on file  Social History Narrative   Not on file   Social Determinants of Health   Financial Resource Strain: Low Risk    Difficulty of Paying Living Expenses: Not hard at all  Food Insecurity: No Food Insecurity   Worried About Charity fundraiser in the Last Year: Never true   [place] in the Last Year: Never true  Transportation Needs: No Transportation Needs   Lack of Transportation (Medical): No   Lack of Transportation (Non-Medical): No  Physical Activity: Inactive   Days of Exercise per Week: 0 days   Minutes of Exercise per Session: 0 min  Stress: No Stress Concern Present   Feeling of Stress : Not at all  Social Connections: Not on file    Tobacco Counseling Counseling given: Not Answered   Clinical Intake:  Pre-visit preparation completed: Yes  Pain : No/denies pain     Nutritional Status: BMI > 30  Obese Nutritional Risks: Nausea/ vomitting/ diarrhea (diarrhea on occasion) Diabetes: Yes  How often do you need to have someone help you when you read  instructions, pamphlets, or other written materials from your doctor or pharmacy?: 1 - Never What is the last grade level you completed in school?: GED  Diabetic? Yes Nutrition Risk Assessment:  Has the patient had any N/V/D within the last 2 months?  Yes  Does the patient have any non-healing wounds?  No  Has the patient had any unintentional weight loss or weight gain?  No   Diabetes:  Is the patient diabetic?  Yes  If diabetic, was a CBG obtained today?  No  Did the patient bring in their glucometer from home?  No  How often do you monitor your CBG's? daily.   Financial Strains and Diabetes Management:  Are you having any financial strains with the device, your supplies or your medication? No .  Does the  patient want to be seen by Chronic Care Management for management of their diabetes?  No  Would the patient like to be referred to a Nutritionist or for Diabetic Management?  No   Diabetic Exams:  Diabetic Eye Exam: Completed 08/15/2020 Diabetic Foot Exam: Completed 05/23/2020   Interpreter Needed?: No  Information entered by :: NAllen LPN   Activities of Daily Living In your present state of health, do you have any difficulty performing the following activities: 03/19/2021  Hearing? N  Vision? N  Difficulty concentrating or making decisions? Y  Walking or climbing stairs? N  Dressing or bathing? N  Doing errands, shopping? N  Preparing Food and eating ? N  Using the Toilet? N  In the past six months, have you accidently leaked urine? Y  Do you have problems with loss of bowel control? N  Managing your Medications? N  Managing your Finances? N  Housekeeping or managing your Housekeeping? N  Some recent data might be hidden    Patient Care Team: [name], DO as PCP - General (Family Medicine) Kathrine Haddock, NP as PCP - Family Medicine (Nurse Practitioner) Kate Sable, MD as PCP - Cardiology (Cardiology) Sharlet Salina, MD as Referring Physician  (Physical Medicine and Rehabilitation) Vanita Ingles, RN as Case Manager (General Practice)  Indicate any recent Medical Services you may have received from other than [facility] providers in the past year (date may be approximate).     Assessment:   This is a routine wellness examination for Teresa [name].  Hearing/Vision screen Vision Screening - Comments:: Regular eye exams, [facility]  Dietary issues and exercise activities discussed: Current Exercise Habits: The patient does not participate in regular exercise at present   Goals Addressed             This Visit's Progress    Patient Stated       03/19/2021, wants to lose 20 pounds       Depression Screen PHQ 2/9 Scores 03/19/2021 05/23/2020 03/10/2020 11/16/2019 05/20/2019 02/17/2019 02/04/2018  PHQ - 2 Score 0 0 1 0 $R'1 1 1  'oU$ PHQ- 9 Score - 0 - - 3 - 3    Fall Risk Fall Risk  03/19/2021 05/23/2020 03/10/2020 05/20/2019 02/17/2019  Falls in the past year? 1 0 0 0 0  Comment tripped over some toys - - - -  Number falls in past yr: 0 0 - 0 -  Injury with Fall? 0 0 - 0 -  Comment - - - - -  Risk for fall due to : Medication side effect No Fall Risks Medication side effect - -  Follow up Falls evaluation completed;Education provided;Falls prevention discussed Falls evaluation completed Falls evaluation completed;Education provided;Falls prevention discussed - -    FALL RISK PREVENTION PERTAINING TO THE HOME:  Any stairs in or around the home? No  If so, are there any without handrails?  N/a Home free of loose throw rugs in walkways, pet beds, electrical cords, etc? Yes  Adequate lighting in your home to reduce risk of falls? Yes   ASSISTIVE DEVICES UTILIZED TO PREVENT FALLS:  Life alert? No  Use of a cane, walker or w/c? No  Grab bars in the bathroom? Yes  Shower chair or bench in shower? Yes  Elevated toilet seat or a handicapped toilet? Yes   TIMED UP AND GO:  Was the test performed? No .      Cognitive Function:      6CIT Screen 03/19/2021 03/10/2020  02/17/2019 02/04/2018 01/08/2017  What Year? 0 points 0 points 0 points 0 points 0 points  What month? 0 points 0 points 0 points 0 points 0 points  What time? 3 points 0 points 0 points 0 points 0 points  Count back from 20 0 points 0 points 0 points 0 points 0 points  Months in reverse 0 points 0 points 0 points 0 points 0 points  Repeat phrase 10 points 4 points 0 points 0 points 2 points  Total Score 13 4 0 0 2    Immunizations Immunization History  Administered Date(s) Administered   Fluad Quad(high Dose 65+) 05/20/2019, 05/02/2020   Influenza, High Dose Seasonal PF 07/10/2016, 04/18/2017, 05/26/2018   Influenza,inj,Quad PF,6+ Mos 07/04/2015   Moderna Sars-Covid-2 Vaccination 10/06/2019, 11/03/2019, 06/07/2020   Pneumococcal Conjugate-13 04/13/2014   Pneumococcal Polysaccharide-23 10/14/2014   Td 03/25/2003, 11/28/2020   Tdap 10/05/2010   Zoster, Live 09/10/2006    TDAP status: Up to date  Flu Vaccine status: Due, Education has been provided regarding the importance of this vaccine. Advised may receive this vaccine at local pharmacy or Health Dept. Aware to provide a copy of the vaccination record if obtained from local pharmacy or Health Dept. Verbalized acceptance and understanding.  Pneumococcal vaccine status: Completed during today's visit.  Covid-19 vaccine status: Completed vaccines  Qualifies for Shingles Vaccine? Yes   Zostavax completed Yes   Shingrix Completed?: No.    Education has been provided regarding the importance of this vaccine. Patient has been advised to call insurance company to determine out of pocket expense if they have not yet received this vaccine. Advised may also receive vaccine at local pharmacy or Health Dept. Verbalized acceptance and understanding.  Screening Tests Health Maintenance  Topic Date Due   COVID-19 Vaccine (4 - Booster for Moderna series) 10/08/2020   INFLUENZA VACCINE  03/12/2021   Zoster  Vaccines- Shingrix (1 of 2) 06/19/2021 (Originally 07/08/1996)   FOOT EXAM  05/23/2021   HEMOGLOBIN A1C  05/30/2021   OPHTHALMOLOGY EXAM  08/15/2021   MAMMOGRAM  05/03/2022   COLONOSCOPY (Pts 45-71yrs Insurance coverage will need to be confirmed)  08/30/2025   TETANUS/TDAP  11/29/2030   DEXA SCAN  Completed   Hepatitis C Screening  Completed   PNA vac Low Risk Adult  Completed   HPV VACCINES  Aged Out    Health Maintenance  Health Maintenance Due  Topic Date Due   COVID-19 Vaccine (4 - Booster for Moderna series) 10/08/2020   INFLUENZA VACCINE  03/12/2021    Colorectal cancer screening: scheduled on 03/22/2021  Mammogram status: Completed 05/03/2020. Repeat every year  Bone Density status: Completed 01/22/2016.   Lung Cancer Screening: (Low Dose CT Chest recommended if Age 16-80 years, 30 pack-year currently smoking OR have quit w/in 15years.) does not qualify.   Lung Cancer Screening Referral: no  Additional Screening:  Hepatitis C Screening: does qualify; Completed 01/03/2016  Vision Screening: Recommended annual ophthalmology exams for early detection of glaucoma and other disorders of the eye. Is the patient up to date with their annual eye exam?  Yes  Who is the provider or what is the name of the office in which the patient attends annual eye exams? [facility] If pt is not established with a provider, would they like to be referred to a provider to establish care? No .   Dental Screening: Recommended annual dental exams for proper oral hygiene  Community Resource Referral / Chronic Care Management: CRR required this visit?  No   CCM required this visit?  No      Plan:     I have personally reviewed and noted the following in the patient's chart:   Medical and social history Use of alcohol, tobacco or illicit drugs  Current medications and supplements including opioid prescriptions.  Functional ability and status Nutritional status Physical  activity Advanced directives List of other physicians Hospitalizations, surgeries, and ER visits in previous 12 months Vitals Screenings to include cognitive, depression, and falls Referrals and appointments  In addition, I have reviewed and discussed with patient certain preventive protocols, quality metrics, and best practice recommendations. A written personalized care plan for preventive services as well as general preventive health recommendations were provided to patient.     Kellie Simmering, LPN   12/11/7614   Nurse Notes:

## 2021-03-22 ENCOUNTER — Ambulatory Visit
Admission: RE | Admit: 2021-03-22 | Discharge: 2021-03-22 | Disposition: A | Discharge status: Home / Self Care | Payer: Medicare HMO | Source: Ambulatory Visit | Attending: Gastroenterology | Admitting: Gastroenterology

## 2021-03-22 DIAGNOSIS — K6389 Other specified diseases of intestine: Secondary | ICD-10-CM | POA: Diagnosis not present

## 2021-03-22 DIAGNOSIS — Z86010 Personal history of colonic polyps: Secondary | ICD-10-CM | POA: Diagnosis not present

## 2021-03-22 DIAGNOSIS — K5790 Diverticulosis of intestine, part unspecified, without perforation or abscess without bleeding: Secondary | ICD-10-CM | POA: Diagnosis not present

## 2021-03-22 DIAGNOSIS — K635 Polyp of colon: Secondary | ICD-10-CM

## 2021-03-22 DIAGNOSIS — K449 Diaphragmatic hernia without obstruction or gangrene: Secondary | ICD-10-CM | POA: Diagnosis not present

## 2021-03-23 ENCOUNTER — Telehealth: Payer: Medicare HMO | Admitting: General Practice

## 2021-03-23 ENCOUNTER — Ambulatory Visit (INDEPENDENT_AMBULATORY_CARE_PROVIDER_SITE_OTHER): Payer: Medicare HMO | Admitting: General Practice

## 2021-03-23 ENCOUNTER — Telehealth: Payer: Self-pay | Admitting: General Practice

## 2021-03-23 DIAGNOSIS — I10 Essential (primary) hypertension: Secondary | ICD-10-CM | POA: Diagnosis not present

## 2021-03-23 DIAGNOSIS — N1831 Chronic kidney disease, stage 3a: Secondary | ICD-10-CM

## 2021-03-23 DIAGNOSIS — Z86010 Personal history of colonic polyps: Secondary | ICD-10-CM

## 2021-03-23 DIAGNOSIS — E1122 Type 2 diabetes mellitus with diabetic chronic kidney disease: Secondary | ICD-10-CM

## 2021-03-23 DIAGNOSIS — E782 Mixed hyperlipidemia: Secondary | ICD-10-CM

## 2021-03-23 NOTE — Telephone Encounter (Signed)
  Chronic Care Management   Note  03/23/2021 Name: Teresa [name] MRN: [id number] DOB: [birth year]  The patient called the office back and ask for the [facility] to call her back. Called the patient and completed the call. See new encounter.   Follow up plan: Telephone follow up appointment with care management team member scheduled for:05-25-2021 at [place] am  Noreene Larsson RN, MSN, [facility] Mobile: [phone]

## 2021-03-23 NOTE — Chronic Care Management (AMB) (Signed)
Chronic Care Management   CCM RN Visit Note  03/23/2021 Name: Teresa [name] MRN: [id number] DOB: 09-20-1945  Subjective: Teresa [name] is a 75 y.o. year old female who is a primary care patient of Brityn, Mastrogiovanni, DO. The care management team was consulted for assistance with disease management and care coordination needs.    Engaged with patient by telephone for follow up visit in response to provider referral for case management and/or care coordination services.   Consent to Services:  The patient was given information about Chronic Care Management services, agreed to services, and gave verbal consent prior to initiation of services.  Please see initial visit note for detailed documentation.   Patient agreed to services and verbal consent obtained.   Assessment: Review of patient past medical history, allergies, medications, health status, including review of consultants reports, laboratory and other test data, was performed as part of comprehensive evaluation and provision of chronic care management services.   SDOH (Social Determinants of Health) assessments and interventions performed:    CCM Care Plan  Allergies  Allergen Reactions   Amlodipine Other (See Comments)    Ankle swelling   Atorvastatin     cramps   Evista [Raloxifene] Nausea Only    Outpatient Encounter Medications as of 03/23/2021  Medication Sig   ASPIRIN 81 PO Take by mouth.   blood glucose meter kit and supplies Dispense based on patient and insurance preference. Use up to four times daily as directed. (FOR ICD-9 250.00, 250.01).   chlorthalidone (HYGROTON) 25 MG tablet Take 1 tablet (25 mg total) by mouth daily.   clobetasol ointment (TEMOVATE) 0.05 % Apply topically 2 (two) times daily.   clopidogrel (PLAVIX) 75 MG tablet Take 1 tablet (75 mg total) by mouth daily.   cyanocobalamin 100 MCG tablet Take 100 mcg by mouth daily.   ferrous sulfate 325 (65 FE) MG tablet Take 1 tablet (325 mg total)  by mouth 2 (two) times daily with a meal. (Patient taking differently: Take 325 mg by mouth daily with breakfast.)   folic acid (FOLVITE) 803 MCG tablet Take 400 mcg by mouth daily.   glucose blood test strip 1 each by Other route 2 (two) times daily. Use as instructed, Dx: E11.22   Lancets MISC 1 each by Does not apply route daily.   levothyroxine (SYNTHROID) 50 MCG tablet TAKE 1 TABLET DAILY   lisinopril (ZESTRIL) 40 MG tablet Take 1 tablet (40 mg total) by mouth daily.   metFORMIN (GLUCOPHAGE-XR) 500 MG 24 hr tablet TAKE 4 TABLETS(2,000MG     TOTAL) DAILY WITH BREAKFAST   rosuvastatin (CRESTOR) 10 MG tablet Take 1 tablet (10 mg total) by mouth daily.   No facility-administered encounter medications on file as of 03/23/2021.    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   CKD (chronic kidney disease), stage III ([place]) 04/03/2015   Anemia 03/10/2014    Conditions to be addressed/monitored:HTN, HLD, DMII, and post colonoscopy assessment   Care Plan : RNCM:  Diabetes Type 2 (Adult)  Updates made by Vanita Ingles since 03/23/2021 12:00 AM     Problem: RNCM: Glycemic Management (Diabetes, Type 2)   Priority: Medium     Long-Range Goal: RNCM: Glycemic Management Optimized   Start Date: 08/25/2020  Expected End Date: 03/18/2022  This Visit's Progress: On track  Priority: Medium  Note:   Objective:  Lab Results  Component Value Date   HGBA1C 6.4  11/28/2020   Lab Results  Component Value Date   CREATININE 0.95 12/19/2020   CREATININE 1.02 (H) 11/28/2020   CREATININE 0.85 05/23/2020   No results found for: EGFR Current Barriers:  Knowledge Deficits related to basic Diabetes pathophysiology and self care/management Knowledge Deficits related to medications used for management of diabetes Limited Social Support Unable to independently manage DM2 Does not contact provider office for questions/concerns Case Manager Clinical Goal(s):  Collaboration with [name], DO regarding development and update of comprehensive plan of care as evidenced by provider attestation and co-signature Inter-disciplinary care team collaboration (see longitudinal plan of care)  patient will demonstrate improved adherence to prescribed treatment plan for diabetes self care/management as evidenced by:  daily monitoring and recording of CBG  adherence to ADA/ carb modified diet adherence to prescribed medication regimen Interventions:  Provided education to patient about basic DM disease process Reviewed medications with patient and discussed importance of medication adherence. 03-23-2021: The patient is compliant with medications Discussed plans with patient for ongoing care management follow up and provided patient with direct contact information for care management team Provided patient with written educational materials related to hypo and hyperglycemia and importance of correct treatment Reviewed scheduled/upcoming provider appointments including:10-11--2022 at 0900 am Advised patient, providing education and rationale, to check cbg daily  and record, calling pcp for findings outside established parameters.  03-23-2021: The patient states that her blood sugar was 140 this am. Has been off the last few days due to prep for her colonoscopy. The patient knows what to do for hypoglycemic events. States she feels fine. Is tolerating her diet. Will continue  to monitor.  Review of patient status, including review of consultants reports, relevant laboratory and other test results, and medications completed. Patient Goals/Self-Care Activities  patient will:  - Self administers oral medications as prescribed Self administers insulin as prescribed Attends all scheduled provider appointments Checks blood sugars as prescribed and utilize hyper and hypoglycemia protocol as needed Adheres to prescribed ADA/carb modified - barriers to adherence to treatment plan identified - blood glucose monitoring encouraged - blood glucose readings reviewed - self-awareness of signs/symptoms of hypo or hyperglycemia encouraged - use of blood glucose monitoring log promoted Follow Up Plan: Telephone follow up appointment with care management team member scheduled for: 05-25-2021 at 0945 am    Task: RNCM: Alleviate Barriers to Glycemic Management Completed 03/23/2021  Outcome: Positive  Note:   Care Management Activities:    - barriers to adherence to treatment plan identified - blood glucose monitoring encouraged - blood glucose readings reviewed - self-awareness of signs/symptoms of hypo or hyperglycemia encouraged - use of blood glucose monitoring log promoted       Care Plan : RNCM: Hypertension (Adult)  Updates made by Vanita Ingles since 03/23/2021 12:00 AM     Problem: RNCM: Hypertension (Hypertension)   Priority: Medium     Long-Range Goal: RNCM: Hypertension Monitored   Start Date: 08/25/2020  Expected End Date: 03/18/2022  This Visit's Progress: On track  Priority: Medium  Note:  Objective:  Last practice recorded BP readings:  BP Readings from Last 3 Encounters:  03/12/21 (!) 152/61  12/19/20 126/62  11/28/20 (!) 160/74   Most recent eGFR/CrCl: No results found for: EGFR  No components found for: CRCL Current Barriers:  Knowledge Deficits related to basic understanding of hypertension pathophysiology and self care  management Knowledge Deficits related to understanding of medications prescribed for management of hypertension Limited Social Support Unable to independently manage HTN Lacks social connections Does not contact provider office for questions/concerns Case Manager Clinical Goal(s):   patient will verbalize understanding of plan for hypertension management  patient will attend all scheduled medical appointments: 11-23-2020 at 0900 am   patient will demonstrate improved adherence to prescribed treatment plan for hypertension as evidenced by taking all medications as prescribed, monitoring and recording blood pressure as directed, adhering to low sodium/DASH diet  patient will demonstrate improved health management independence as evidenced by checking blood pressure as directed and notifying PCP if SBP>160 or DBP > 90, taking all medications as prescribe, and adhering to a low sodium diet as discussed. Interventions:  Collaboration with [name], DO regarding development and update of comprehensive plan of care as evidenced by provider attestation and co-signature Inter-disciplinary care team collaboration (see longitudinal plan of care) Evaluation of current treatment plan related to hypertension self management and patient's adherence to plan as established by provider. 03-23-2021: The patients pressure has been a little elevated. She has been a little stressed due to her colonoscopy she had on 03-22-2021 and pending test results. The patient denies any acute distress and states it is better since her procedure. States she feels fine and will be better once she receives the results from her colonoscopy. Will continue to monitor for changes.  Provided education to patient re: stroke prevention, s/s of heart attack and stroke, DASH diet, complications of uncontrolled blood pressure Reviewed medications with patient and discussed importance of compliance. 03-23-2021: Is compliant with  medications Discussed plans with patient for ongoing care management follow up and provided patient with direct contact information for care management team Advised patient, providing education and rationale, to monitor blood pressure daily and record, calling PCP for findings outside established parameters.  Reviewed scheduled/upcoming provider appointments including: 05-22-2021 at 0900 am Patient Goals/Self-Care Activities patient will:  - Self administers medications as prescribed Attends all scheduled provider appointments Calls provider office for new concerns, questions, or BP outside discussed parameters Checks BP and records as discussed Follows a low sodium diet/DASH diet - blood pressure trends reviewed - depression screen reviewed - home or ambulatory blood pressure monitoring encouraged Follow Up Plan: Telephone follow up appointment with care management team member scheduled for: 10-14--2022 0945 am    Task: RNCM: Identify and Monitor Blood Pressure Elevation Completed 03/23/2021  Outcome: Positive  Note:   Care Management Activities:    - blood pressure trends reviewed - depression screen reviewed - home or ambulatory blood pressure monitoring encouraged        Care Plan : RNCM: HLD  Updates made by Vanita Ingles since 03/23/2021 12:00 AM     Problem: HLD: Health Promotion or Disease Self-Management (General Plan of Care)   Priority: Medium     Long-Range Goal: RNCM: HLD Self-Management Plan Developed   Start Date: 08/25/2020  Expected End Date: 03/18/2022  This Visit's Progress: On track  Priority: Medium  Note:   Current Barriers:  Poorly controlled hyperlipidemia, complicated by HTN Current antihyperlipidemic regimen: Crestor 32m QD Most recent  lipid panel:     Component Value Date/Time   CHOL 131 11/28/2020 1602   CHOL 147 07/04/2015 0820   TRIG 100 11/28/2020 1602   TRIG 89 07/04/2015 0820   HDL 53 11/28/2020 1602   VLDL 18 07/04/2015 0820    LDLCALC 59 11/28/2020 1602   ASCVD risk enhancing conditions: age >63, DM, HTN, CKD, CHF, current smoker Unable to independently HLD Does not contact provider office for questions/concerns  RN Care Manager Clinical Goal(s):   patient will work with Consulting civil engineer, providers, and care team towards execution of optimized self-health management plan patient will verbalize understanding of plan for effectively managing HLD patient will work with [facility] and pcp to address needs related to management of HLD patient will attend all scheduled medical appointments: 05-22-2021 at 0900  Interventions: Collaboration with [name], DO regarding development and update of comprehensive plan of care as evidenced by provider attestation and co-signature Inter-disciplinary care team collaboration (see longitudinal plan of care) Medication review performed; medication list updated in electronic medical record.  Inter-disciplinary care team collaboration (see longitudinal plan of care) Referred to pharmacy team for assistance with HLD medication management Evaluation of current treatment plan related to HLD and patient's adherence to plan as established by provider. 03-23-2021: The patient is doing well and denies any new concerns with her HLD.  Advised patient to call the office for new questions or concerns  Reviewed medications with patient and discussed compliance  Discussed plans with patient for ongoing care management follow up and provided patient with direct contact information for care management team   Patient Goals/Self-Care Activities: patient will:   - call for medicine refill 2 or 3 days before it runs out - call if I am sick and can't take my medicine - keep a list of all the medicines I take; vitamins and herbals too - learn to read medicine labels - use a pillbox to sort medicine - use an alarm clock or phone to remind me to take my medicine - change to whole grain breads, cereal,  pasta - drink 6 to 8 glasses of water each day - eat 3 to 5 servings of fruits and vegetables each day - eat 5 or 6 small meals each day - fill half the plate with nonstarchy vegetables - limit fast food meals to no more than 1 per week - manage portion size - prepare main meal at home 3 to 5 days each week - read food labels for fat, fiber, carbohydrates and portion size - be open to making changes - I can manage, know and watch for signs of a heart attack - if I have chest pain, call for help - learn about small changes that will make a big difference - learn my personal risk factors - barriers to meeting goals identified - change-talk evoked - choices provided - decision-making supported - problem-solving facilitated - questions answered - reassurance provided - self-reflection promoted - self-reliance encouraged   Follow Up Plan: Telephone follow up appointment with care management team member scheduled for: 05-25-2021 and 0945 am      Task: RNCM: Mutually Develop and Royce Macadamia Achievement of Patient Goals Completed 03/23/2021  Outcome: Positive  Note:   Care Management Activities:    - barriers to meeting goals identified - change-talk evoked - choices provided - decision-making supported - problem-solving facilitated - questions answered - reassurance provided - self-reflection promoted - self-reliance encouraged         Plan:Telephone follow  up appointment with care management team member scheduled for:  05-25-2021 at [place] am Noreene Larsson RN, MSN, [facility] Mobile: [phone]

## 2021-03-23 NOTE — Patient Instructions (Signed)
Visit Information  PATIENT GOALS:  Goals Addressed               This Visit's Progress     PT: "I am waiting for my results from my colonoscopy" (pt-stated)        Patient had a colonoscopy on 03-22-2021. She has a history of anemia, colonic polyps, and constipation.  She has had colonoscopy procedures in the past. This is the first one she has had that is CT guided. She is a little anxious about test results. Denies any acute distress. Will continue to monitor.  Patient goals:  -resume diet as tolerated post colonoscopy -monitor for bleeding or any other abnormal findings -seek medical attention for any changes in condition or worsening sx and sx post colonoscopy -keep follow up appointments -call office for changes in condition   The patient knows what worse looks like. Is feeling better. Am blood sugar this morning was 140. Will continue to monitor.         Patient verbalizes understanding of instructions provided today and agrees to view in [place].   Telephone follow up appointment with care management team member scheduled for: 05-25-2021 at [place] am  Noreene Larsson RN, MSN, [facility] Mobile: [phone]

## 2021-04-01 DIAGNOSIS — J0100 Acute maxillary sinusitis, unspecified: Secondary | ICD-10-CM | POA: Diagnosis not present

## 2021-04-05 ENCOUNTER — Telehealth: Payer: Self-pay

## 2021-04-05 NOTE — Telephone Encounter (Signed)
I can work patient in tomorrow at 1120. However, she should give the current antibiotic more of a chance to work.  Her symptoms will not resolve right away.

## 2021-04-05 NOTE — Telephone Encounter (Signed)
Copied from [place] [id number]. Topic: Appointment Scheduling - Scheduling Inquiry for Clinic >> Apr 04, 2021  3:59 PM Rayann Heman wrote: Reason for CRM: Patient called and states that she is having sinus pain and would like to know if she can be worked in for an appointment.  Routing to provider to advise. Chart reviewed. Patient went to [facility] urgent care 04/01/21 and was prescribed 10 days worth of antibiotics.

## 2021-04-05 NOTE — Telephone Encounter (Signed)
Patient aware of appointment

## 2021-04-05 NOTE — Telephone Encounter (Signed)
Can we find out why she would like to be seen if she was just given antibiotics a couple of days ago?

## 2021-04-06 ENCOUNTER — Telehealth (INDEPENDENT_AMBULATORY_CARE_PROVIDER_SITE_OTHER): Payer: Medicare HMO | Admitting: Nurse Practitioner

## 2021-04-06 ENCOUNTER — Encounter: Payer: Self-pay | Admitting: Nurse Practitioner

## 2021-04-06 ENCOUNTER — Other Ambulatory Visit: Payer: Self-pay

## 2021-04-06 VITALS — BP 130/56 | HR 55

## 2021-04-06 DIAGNOSIS — J069 Acute upper respiratory infection, unspecified: Secondary | ICD-10-CM | POA: Diagnosis not present

## 2021-04-06 NOTE — Progress Notes (Signed)
BP (!) 130/56   Pulse (!) 55   LMP  (LMP Unknown)    Subjective:    Patient ID: Teresa [name], female    DOB: 07/28/1946, 75 y.o.   MRN: [id number]  HPI: Teresa [name] is a 75 y.o. female  Chief Complaint  Patient presents with   Facial Pain    Jaw when she opens it, around eye, cough and runny nose, patient states that she feels better than she did.  Patient has been on an antibiotic for about 6 days   UPPER RESPIRATORY TRACT INFECTION Patient states she was seen in [facility] on Sunday and was diagnosed with a sinus infection.  She was given cefdinir for her sinus infection.  Patient states she isn't getting over her sinus infection and would like something different.  She has been on the antibiotic for 6 days and isn't better yet.  Worst symptom: jaw pain Fever: no Cough: yes Shortness of breath: no Wheezing: no Chest pain: no Chest tightness: no Chest congestion: no Nasal congestion: yes Runny nose: yes Post nasal drip: yes Sneezing: yes Sore throat: no Swollen glands: no Sinus pressure: no Headache: yes Face pain: yes Toothache: yes Ear pain: yes left Ear pressure: no bilateral Eyes red/itching:no Eye drainage/crusting: no  Vomiting: no Rash: no Fatigue: yes  Relevant past medical, surgical, family and social history reviewed and updated as indicated. Interim medical history since our last visit reviewed. Allergies and medications reviewed and updated.  Review of Systems  Constitutional:  Positive for fatigue. Negative for fever.  HENT:  Positive for congestion, ear pain, postnasal drip, rhinorrhea and sneezing. Negative for dental problem (jaw pain), sinus pressure, sinus pain and sore throat.   Respiratory:  Positive for cough. Negative for shortness of breath and wheezing.   Cardiovascular:  Negative for chest pain.  Gastrointestinal:  Negative for vomiting.  Skin:  Negative for rash.  Neurological:  Positive for headaches.   Per HPI unless  specifically indicated above     Objective:    BP (!) 130/56   Pulse (!) 55   LMP  (LMP Unknown)   Wt Readings from Last 3 Encounters:  03/19/21 165 lb (74.8 kg)  03/12/21 169 lb (76.7 kg)  12/19/20 165 lb (74.8 kg)    Physical Exam Vitals and nursing note reviewed.  Constitutional:      General: She is not in acute distress.    Appearance: She is not ill-appearing.  HENT:     Head: Normocephalic.     Right Ear: Hearing normal.     Left Ear: Hearing normal.     Nose: Nose normal.  Eyes:     Pupils: Pupils are equal, round, and reactive to light.  Pulmonary:     Effort: Pulmonary effort is normal. No respiratory distress.  Neurological:     Mental Status: She is alert.  Psychiatric:        Mood and Affect: Mood normal.        Behavior: Behavior normal.        Thought Content: Thought content normal.        Judgment: Judgment normal.    Results for orders placed or performed in visit on 03/12/21  CBC  Result Value Ref Range   WBC 8.0 3.4 - 10.8 x10E3/uL   RBC 3.45 (L) 3.77 - 5.28 x10E6/uL   Hemoglobin 10.7 (L) 11.1 - 15.9 g/dL   Hematocrit 32.4 (L) 34.0 - 46.6 %   MCV 94 79 -  97 fL   MCH 31.0 26.6 - 33.0 pg   MCHC 33.0 31.5 - 35.7 g/dL   RDW 12.8 11.7 - 15.4 %   Platelets 244 150 - 450 x10E3/uL  Ferritin  Result Value Ref Range   Ferritin 77 15 - 150 ng/mL  Iron and TIBC  Result Value Ref Range   Total Iron Binding Capacity 259 250 - 450 ug/dL   UIBC 210 118 - 369 ug/dL   Iron 49 27 - 139 ug/dL   Iron Saturation 19 15 - 55 %      Assessment & Plan:   Problem List Items Addressed This Visit       Respiratory   Viral upper respiratory tract infection - Primary    Discussed with patient that her illness is likely viral in nature and that is why the antibiotic is not helping. Continue to rest and OTC managment. If not improved by Monday can change medication to Augmentin. Discussed risks of taking another course of antibiotics with patient during visit  today. Patient agrees with the plan of care.         Follow up plan: Return if symptoms worsen or fail to improve.  This visit was completed via MyChart due to the restrictions of the COVID-19 pandemic. All issues as above were discussed and addressed. Physical exam was done as above through visual confirmation on MyChart. If it was felt that the patient should be evaluated in the office, they were directed there. The patient verbally consented to this visit. Location of the patient: Home Location of the provider: Office Those involved with this call:  Provider: Jon Billings, NP CMA: Tiffany Reel, CMA Front Desk/Registration: Elizabeth Palau, CMA This encounter was conducted via video.  I spent 15 dedicated to the care of this patient on the date of this encounter to include previsit review of 20, face to face time with the patient, and post visit ordering of testing.

## 2021-04-06 NOTE — Assessment & Plan Note (Signed)
Discussed with patient that her illness is likely viral in nature and that is why the antibiotic is not helping. Continue to rest and OTC managment. If not improved by Monday can change medication to Augmentin. Discussed risks of taking another course of antibiotics with patient during visit today. Patient agrees with the plan of care.

## 2021-04-17 ENCOUNTER — Encounter: Payer: Self-pay | Admitting: Oncology

## 2021-04-17 ENCOUNTER — Inpatient Hospital Stay: Payer: Medicare HMO

## 2021-04-17 ENCOUNTER — Inpatient Hospital Stay: Payer: Medicare HMO | Attending: Oncology | Admitting: Oncology

## 2021-04-17 VITALS — BP 144/46 | HR 54 | Temp 97.1°F | Resp 16 | Ht 62.0 in | Wt 165.8 lb

## 2021-04-17 DIAGNOSIS — Z79899 Other long term (current) drug therapy: Secondary | ICD-10-CM | POA: Diagnosis not present

## 2021-04-17 DIAGNOSIS — D649 Anemia, unspecified: Secondary | ICD-10-CM

## 2021-04-17 DIAGNOSIS — Z8719 Personal history of other diseases of the digestive system: Secondary | ICD-10-CM | POA: Insufficient documentation

## 2021-04-17 DIAGNOSIS — N183 Chronic kidney disease, stage 3 unspecified: Secondary | ICD-10-CM | POA: Insufficient documentation

## 2021-04-17 DIAGNOSIS — R5382 Chronic fatigue, unspecified: Secondary | ICD-10-CM | POA: Diagnosis not present

## 2021-04-17 DIAGNOSIS — E039 Hypothyroidism, unspecified: Secondary | ICD-10-CM | POA: Insufficient documentation

## 2021-04-17 LAB — CBC WITH DIFFERENTIAL/PLATELET
Abs Immature Granulocytes: 0.03 10*3/uL (ref 0.00–0.07)
Basophils Absolute: 0 10*3/uL (ref 0.0–0.1)
Basophils Relative: 1 %
Eosinophils Absolute: 0.1 10*3/uL (ref 0.0–0.5)
Eosinophils Relative: 1 %
HCT: 33.4 % — ABNORMAL LOW (ref 36.0–46.0)
Hemoglobin: 10.7 g/dL — ABNORMAL LOW (ref 12.0–15.0)
Immature Granulocytes: 0 %
Lymphocytes Relative: 31 %
Lymphs Abs: 2.3 10*3/uL (ref 0.7–4.0)
MCH: 30.7 pg (ref 26.0–34.0)
MCHC: 32 g/dL (ref 30.0–36.0)
MCV: 95.7 fL (ref 80.0–100.0)
Monocytes Absolute: 0.6 10*3/uL (ref 0.1–1.0)
Monocytes Relative: 8 %
Neutro Abs: 4.5 10*3/uL (ref 1.7–7.7)
Neutrophils Relative %: 59 %
Platelets: 338 10*3/uL (ref 150–400)
RBC: 3.49 MIL/uL — ABNORMAL LOW (ref 3.87–5.11)
RDW: 12.4 % (ref 11.5–15.5)
WBC: 7.5 10*3/uL (ref 4.0–10.5)
nRBC: 0 % (ref 0.0–0.2)

## 2021-04-17 LAB — COMPREHENSIVE METABOLIC PANEL
ALT: 17 U/L (ref 0–44)
AST: 19 U/L (ref 15–41)
Albumin: 4.3 g/dL (ref 3.5–5.0)
Alkaline Phosphatase: 76 U/L (ref 38–126)
Anion gap: 10 (ref 5–15)
BUN: 32 mg/dL — ABNORMAL HIGH (ref 8–23)
CO2: 24 mmol/L (ref 22–32)
Calcium: 9.6 mg/dL (ref 8.9–10.3)
Chloride: 106 mmol/L (ref 98–111)
Creatinine, Ser: 1.1 mg/dL — ABNORMAL HIGH (ref 0.44–1.00)
GFR, Estimated: 53 mL/min — ABNORMAL LOW (ref >60)
Glucose, Bld: 90 mg/dL (ref 70–99)
Potassium: 4.3 mmol/L (ref 3.5–5.1)
Sodium: 140 mmol/L (ref 135–145)
Total Bilirubin: 0.2 mg/dL — ABNORMAL LOW (ref 0.3–1.2)
Total Protein: 8.3 g/dL — ABNORMAL HIGH (ref 6.5–8.1)

## 2021-04-17 LAB — SEDIMENTATION RATE: Sed Rate: 60 mm/hr — ABNORMAL HIGH (ref 0–30)

## 2021-04-17 LAB — FOLATE: Folate: >100 ng/mL (ref >5.9)

## 2021-04-17 LAB — VITAMIN B12: Vitamin B-12: 2601 pg/mL — ABNORMAL HIGH (ref 180–914)

## 2021-04-17 LAB — RETICULOCYTES
Immature Retic Fract: 14.2 % (ref 2.3–15.9)
RBC.: 3.5 MIL/uL — ABNORMAL LOW (ref 3.87–5.11)
Retic Count, Absolute: 70.4 10*3/uL (ref 19.0–186.0)
Retic Ct Pct: 2 % (ref 0.4–3.1)

## 2021-04-17 LAB — TSH: TSH: 2.512 u[IU]/mL (ref 0.350–4.500)

## 2021-04-17 NOTE — Progress Notes (Signed)
Hematology/Oncology Consult note [facility] Telephone:([phone] [phone]  Patient Care Team: Dorcas Carrow, DO as PCP - General (Family Medicine) Gabriel Cirri, NP as PCP - Family Medicine (Nurse Practitioner) Debbe Odea, MD as PCP - Cardiology (Cardiology) Merri Ray, MD as Referring Physician (Physical Medicine and Rehabilitation) Marlowe Sax, RN as Case Manager (General Practice)   Name of the patient: Teresa [name]  [id number]  [birth year]    Reason for referral-anemia   Referring physician-Dr. Maximino Greenland  Date of visit: 04/17/21   History of presenting illness- Patient is a 75 year old female with a past medical history significant for hypertension hyperlipidemia hypothyroidism who was seen by Dr. Berneda Rose for symptoms of constipation.  As a part of her work-up she had her CBC checked which showed a low hemoglobin of 10.7 and hence she has been referred to [place].  Looking back at her prior CBCs patient has had a normal white count and a platelet count but hemoglobin has been between 10-11 since 2017 and has remained stable around that range.  There has been no improvement nor decline in her hemoglobin.  Ferritin levels were normal at 77 on 03/12/2021 with normal iron studies.  TSH in April 2022 was normal.  Patient is doing well for range.  She remains independent of her ADLs.  She reports some mild chronic fatigue but denies other complaints.  ECOG PS- 1  Pain scale- 0   Review of systems- Review of Systems  Constitutional:  Negative for chills, fever, malaise/fatigue and weight loss.  HENT:  Negative for congestion, ear discharge and nosebleeds.   Eyes:  Negative for blurred vision.  Respiratory:  Negative for cough, hemoptysis, sputum production, shortness of breath and wheezing.   Cardiovascular:  Negative for chest pain, palpitations, orthopnea and claudication.  Gastrointestinal:  Negative for abdominal pain,  blood in stool, constipation, diarrhea, heartburn, melena, nausea and vomiting.  Genitourinary:  Negative for dysuria, flank pain, frequency, hematuria and urgency.  Musculoskeletal:  Negative for back pain, joint pain and myalgias.  Skin:  Negative for rash.  Neurological:  Negative for dizziness, tingling, focal weakness, seizures, weakness and headaches.  Endo/Heme/Allergies:  Does not bruise/bleed easily.  Psychiatric/Behavioral:  Negative for depression and suicidal ideas. The patient does not have insomnia.    Allergies  Allergen Reactions   Amlodipine Other (See Comments)    Ankle swelling   Atorvastatin     cramps   Evista [Raloxifene] Nausea Only    Patient Active Problem List   Diagnosis Date Noted   Viral upper respiratory tract infection 04/06/2021   History of colonic polyps 05/18/2020   Bradycardia 02/11/2018   Lumbar radiculopathy 09/27/2017   Advanced care planning/counseling discussion 01/22/2017   Type 2 diabetes mellitus with stage 3 chronic kidney disease, without long-term current use of insulin (HCC) 07/04/2015   CVA (cerebral infarction) 04/03/2015   Obesity 04/03/2015   Lichen sclerosus 04/03/2015   Hypothyroidism 04/03/2015   Osteopenia 04/03/2015   Hyperlipidemia 04/03/2015   B12 deficiency 04/03/2015   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   CKD (chronic kidney disease), stage III (HCC) 04/03/2015   Anemia 03/10/2014     Past Medical History:  Diagnosis Date   Diabetes mellitus without complication (HCC)    Heart murmur    Hyperlipidemia    Hypertension    Hypothyroidism    Lichen sclerosus    Obesity    Osteopenia    Stroke [facility])    Thyroid disease  Past Surgical History:  Procedure Laterality Date   carpel tunnel     COLONOSCOPY WITH PROPOFOL N/A 08/30/2020   Procedure: COLONOSCOPY WITH PROPOFOL;  Surgeon: Virgel Manifold, MD;  Location: [facility] ENDOSCOPY;  Service: Gastroenterology;  Laterality: N/A;   ROTATOR  CUFF REPAIR Left    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      Social History   Socioeconomic History   Marital status: Married    Spouse name: Not on file   Number of children: Not on file   Years of education: Not on file   Highest education level: High school graduate  Occupational History   Occupation: retired  Tobacco Use   Smoking status: Former    Packs/day: 2.00    Types: Cigarettes    Quit date: 01/1965    Years since quitting: 56.3   Smokeless tobacco: Never  Vaping Use   Vaping Use: Never used  Substance and Sexual Activity   Alcohol use: No    Alcohol/week: 0.0 standard drinks   Drug use: No   Sexual activity: Never  Other Topics Concern   Not on file  Social History Narrative   Not on file   Social Determinants of Health   Financial Resource Strain: Low Risk    Difficulty of Paying Living Expenses: Not hard at all  Food Insecurity: No Food Insecurity   Worried About Charity fundraiser in the Last Year: Never true   [place] in the Last Year: Never true  Transportation Needs: No Transportation Needs   Lack of Transportation (Medical): No   Lack of Transportation (Non-Medical): No  Physical Activity: Inactive   Days of Exercise per Week: 0 days   Minutes of Exercise per Session: 0 min  Stress: No Stress Concern Present   Feeling of Stress : Not at all  Social Connections: Socially Integrated   Frequency of Communication with Friends and Family: More than three times a week   Frequency of Social Gatherings with Friends and Family: More than three times a week   Attends Religious Services: More than 4 times per year   Active Member of [employer] or Organizations: Yes   Attends Music therapist: More than 4 times per year   Marital Status: Married  Human resources officer Violence: Not At Risk   Fear of Current or Ex-Partner: No   Emotionally Abused: No   Physically Abused: No   Sexually Abused: No     Family History  Problem Relation Age of  Onset   Hypertension Mother    Thyroid disease Mother    Glaucoma Mother    Coronary artery disease Mother    Stroke Father    Diabetes Brother    Hypertension Maternal Grandfather    Breast cancer Neg Hx      Current Outpatient Medications:    ASPIRIN 81 PO, Take by mouth daily., Disp: , Rfl:    blood glucose meter kit and supplies, Dispense based on patient and insurance preference. Use up to four times daily as directed. (FOR ICD-9 250.00, 250.01)., Disp: 1 each, Rfl: 0   chlorthalidone (HYGROTON) 25 MG tablet, Take 1 tablet (25 mg total) by mouth daily., Disp: 90 tablet, Rfl: 1   clobetasol ointment (TEMOVATE) 0.05 %, Apply topically 2 (two) times daily., Disp: 30 g, Rfl: 3   clopidogrel (PLAVIX) 75 MG tablet, Take 1 tablet (75 mg total) by mouth daily., Disp: 90 tablet, Rfl: 3   ferrous sulfate 325 (65 FE) MG  tablet, Take 1 tablet (325 mg total) by mouth 2 (two) times daily with a meal. (Patient taking differently: Take 325 mg by mouth daily with breakfast.), Disp: 180 tablet, Rfl: 3   folic acid (FOLVITE) 726 MCG tablet, Take 400 mcg by mouth daily., Disp: , Rfl:    glucose blood test strip, 1 each by Other route 2 (two) times daily. Use as instructed, Dx: E11.22, Disp: 100 each, Rfl: 12   Lancets MISC, 1 each by Does not apply route daily., Disp: 100 each, Rfl: 12   levothyroxine (SYNTHROID) 50 MCG tablet, TAKE 1 TABLET DAILY, Disp: 90 tablet, Rfl: 3   lisinopril (ZESTRIL) 40 MG tablet, Take 1 tablet (40 mg total) by mouth daily., Disp: 90 tablet, Rfl: 1   metFORMIN (GLUCOPHAGE-XR) 500 MG 24 hr tablet, TAKE 4 TABLETS(2,$RemoveBeforeDE'000MG'mjKtEjLXqHFBKLA$      TOTAL) DAILY WITH BREAKFAST, Disp: 360 tablet, Rfl: 1   rosuvastatin (CRESTOR) 10 MG tablet, Take 1 tablet (10 mg total) by mouth daily., Disp: 90 tablet, Rfl: 1   vitamin B-12 (CYANOCOBALAMIN) 1000 MCG tablet, Take 1,000 mcg by mouth daily., Disp: , Rfl:    Physical exam:  Vitals:   04/17/21 1100  BP: (!) 144/46  Pulse: (!) 54  Resp: 16  Temp: (!)  97.1 F (36.2 C)  TempSrc: Tympanic  Weight: 165 lb 12.8 oz (75.2 kg)  Height: $Remove'5\' 2"'XjfkWCv$  (1.575 m)   Physical Exam Constitutional:      General: She is not in acute distress. Cardiovascular:     Rate and Rhythm: Normal rate and regular rhythm.     Heart sounds: Normal heart sounds.  Pulmonary:     Effort: Pulmonary effort is normal.     Breath sounds: Normal breath sounds.  Abdominal:     General: Bowel sounds are normal.     Palpations: Abdomen is soft.     Comments: No palpable hepatosplenomegaly  Lymphadenopathy:     Comments: No palpable cervical, supraclavicular, axillary or inguinal adenopathy    Skin:    General: Skin is warm and dry.  Neurological:     Mental Status: She is alert and oriented to person, place, and time.       CMP Latest Ref Rng & Units 04/17/2021  Glucose 70 - 99 mg/dL 90  BUN 8 - 23 mg/dL 32(H)  Creatinine 0.44 - 1.00 mg/dL 1.10(H)  Sodium 135 - 145 mmol/L 140  Potassium 3.5 - 5.1 mmol/L 4.3  Chloride 98 - 111 mmol/L 106  CO2 22 - 32 mmol/L 24  Calcium 8.9 - 10.3 mg/dL 9.6  Total Protein 6.5 - 8.1 g/dL 8.3(H)  Total Bilirubin 0.3 - 1.2 mg/dL 0.2(L)  Alkaline Phos 38 - 126 U/L 76  AST 15 - 41 U/L 19  ALT 0 - 44 U/L 17   CBC Latest Ref Rng & Units 04/17/2021  WBC 4.0 - 10.5 K/uL 7.5  Hemoglobin 12.0 - 15.0 g/dL 10.7(L)  Hematocrit 36.0 - 46.0 % 33.4(L)  Platelets 150 - 400 K/uL 338    No images are attached to the encounter.  CT VIRTUAL COLONOSCOPY DIAGNOSTIC  Result Date: 03/23/2021 CLINICAL DATA:  History of colonic polyps. Failed colonoscopy in January. EXAM: CT VIRTUAL COLONOSCOPY DIAGNOSTIC TECHNIQUE: The patient was given a standard bowel preparation with Gastrografin and barium for fluid and stool tagging respectively. The quality of the bowel preparation is moderate. Automated CO2 insufflation of the colon was performed prior to image acquisition and colonic distention is moderate. Image post processing was used to  generate a 3D  endoluminal fly-through projection of the colon and to electronically subtract stool/fluid as appropriate. COMPARISON:  Colonoscopy report of 08/30/2020 this advanced only to the sigmoid colon. Abdominal ultrasound 05/05/2017. FINDINGS: VIRTUAL COLONOSCOPY With both supine and less so prone imaging, the sigmoid is underdistended. There are diverticula within, but no evidence of circumferential mass. From the distal descending colon proximally, distension is good on supine imaging and moderate on prone imaging. The prone images are also degraded by motion. A moderate amount of retained contrast is seen. Soft tissue densities in the dependent cecum including on 208/4 and 214/11 are favored to be due to retained stool. Given above limitations, no evidence of clinically significant colonic polyp or mass. Virtual colonoscopy is not designed to detect diminutive polyps (i.e., less than or equal to 5 mm), the presence or absence of which may not affect clinical management. CT ABDOMEN AND PELVIS WITHOUT CONTRAST Lower chest: Clear lung bases. Normal heart size without pericardial or pleural effusion. Tiny hiatal hernia. Hepatobiliary: Nonspecific mild caudate lobe enlargement. Normal gallbladder, without biliary ductal dilatation. Pancreas: Normal, without mass or ductal dilatation. Spleen: Normal in size, without focal abnormality. Adrenals/Urinary Tract: Normal adrenal glands. Mild renal cortical thinning bilaterally. No bladder calculi. Stomach/Bowel: Normal remainder of the stomach. Normal terminal ileum and appendix. Normal small bowel. Vascular/Lymphatic: Aortic atherosclerosis. No abdominopelvic adenopathy. Reproductive: Normal uterus and adnexa. Other: No significant free fluid.  No free intraperitoneal air. Musculoskeletal: Osteopenia. IMPRESSION: 1. Limitations as detailed above, primarily involving the sigmoid. No evidence of clinically significant colonic polyp or mass. 2.  No acute process in the abdomen or  pelvis. Electronically Signed   By: Abigail Miyamoto M.D.   On: 03/23/2021 10:07    Assessment and plan- Patient is a 75 y.o. female referred for normocytic anemia  Patient has baseline anemia which has remained stable between 10-11 for the last 5 years.  Iron studies do not reveal any evidence of iron deficiency.TSH normal and baseline kidney functions normal.  Today I will check a CBC with differential, B12 folate reticulocyte count myeloma panel haptoglobin TSH, lumbar light chain and ESR.  I will see her in 2 weeks for a video visit to discuss results of blood work.     Thank you for this kind referral and the opportunity to participate in the care of this patient   Visit Diagnosis 1. Normocytic anemia     Dr. Randa Evens, MD, MPH [facility] [phone] 04/17/2021

## 2021-04-17 NOTE — Progress Notes (Signed)
Pt c/o fatigue daily. Denies blood in stools.

## 2021-04-18 LAB — KAPPA/LAMBDA LIGHT CHAINS
Kappa free light chain: 49.2 mg/L — ABNORMAL HIGH (ref 3.3–19.4)
Kappa, lambda light chain ratio: 1.63 (ref 0.26–1.65)
Lambda free light chains: 30.1 mg/L — ABNORMAL HIGH (ref 5.7–26.3)

## 2021-04-18 LAB — HAPTOGLOBIN: Haptoglobin: 252 mg/dL (ref 42–346)

## 2021-04-19 LAB — MULTIPLE MYELOMA PANEL, SERUM
Albumin SerPl Elph-Mcnc: 3.9 g/dL (ref 2.9–4.4)
Albumin/Glob SerPl: 1.2 (ref 0.7–1.7)
Alpha 1: 0.2 g/dL (ref 0.0–0.4)
Alpha2 Glob SerPl Elph-Mcnc: 1 g/dL (ref 0.4–1.0)
B-Globulin SerPl Elph-Mcnc: 1.1 g/dL (ref 0.7–1.3)
Gamma Glob SerPl Elph-Mcnc: 1 g/dL (ref 0.4–1.8)
Globulin, Total: 3.4 g/dL (ref 2.2–3.9)
IgA: 238 mg/dL (ref 64–422)
IgG (Immunoglobin G), Serum: 1029 mg/dL (ref 586–1602)
IgM (Immunoglobulin M), Srm: 93 mg/dL (ref 26–217)
Total Protein ELP: 7.3 g/dL (ref 6.0–8.5)

## 2021-04-25 ENCOUNTER — Other Ambulatory Visit: Payer: Self-pay

## 2021-04-25 ENCOUNTER — Encounter: Payer: Self-pay | Admitting: Gastroenterology

## 2021-04-25 ENCOUNTER — Ambulatory Visit: Payer: Medicare HMO | Admitting: Gastroenterology

## 2021-04-25 VITALS — BP 152/74 | HR 51 | Temp 97.8°F | Ht 61.0 in | Wt 166.8 lb

## 2021-04-25 DIAGNOSIS — D649 Anemia, unspecified: Secondary | ICD-10-CM

## 2021-04-25 DIAGNOSIS — K5900 Constipation, unspecified: Secondary | ICD-10-CM | POA: Diagnosis not present

## 2021-04-25 NOTE — Progress Notes (Signed)
Melodie Bouillon, MD [address]  [address]  [place], [place] [address]  Main: [phone]  Fax: [phone]   Primary Care Physician: Dorcas Carrow, DO   Chief complaint: Constipation  HPI: BRYLEI PEDLEY is a 75 y.o. female here for follow-up of constipation.  Patient reports complete resolution of constipation after she discontinued her oral iron iron supplementation since her work-up did not show iron deficiency.  She is not using any fiber products or medications for her bowel movements given that she is not having any further constipation.  The patient denies abdominal or flank pain, anorexia, nausea or vomiting, dysphagia, change in bowel habits or black or bloody stools or weight loss.  She was referred to hematology for normocytic anemia.  Dr. Assunta Gambles notes reviewed and reveal the patient has not had any evidence of iron deficiency and further testing has been ordered by them.  Previous history:  seen for a colonoscopy in January 2022 for history of polyps, and procedure was incomplete despite position changes and abdominal pressure, due to tortuous sigmoid colon, with multiple diverticula in the area.  Prior to that she had a colonoscopy in 2015 with Dr. Marva Panda that was incomplete, and in 2001 colonoscopy with 1, 84mm polyp removed.  Pathology report not available.  CT colonoscopy in August 2022 did not show any evidence of clinically significant colonic polyp or mass  ROS: All ROS reviewed and negative except as per HPI   Past Medical History:  Diagnosis Date   Diabetes mellitus without complication (HCC)    Heart murmur    Hyperlipidemia    Hypertension    Hypothyroidism    Lichen sclerosus    Obesity    Osteopenia    Stroke [facility])    Thyroid disease     Past Surgical History:  Procedure Laterality Date   carpel tunnel     COLONOSCOPY WITH PROPOFOL N/A 08/30/2020   Procedure: COLONOSCOPY WITH PROPOFOL;  Surgeon: Pasty Spillers, MD;   Location: [facility] ENDOSCOPY;  Service: Gastroenterology;  Laterality: N/A;   ROTATOR CUFF REPAIR Left    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      Prior to Admission medications   Medication Sig Start Date End Date Taking? Authorizing Provider  ASPIRIN 81 PO Take by mouth daily.   Yes [provider]  blood glucose meter kit and supplies Dispense based on patient and insurance preference. Use up to four times daily as directed. (FOR ICD-9 250.00, 250.01). 12/08/17  Yes Gabriel Cirri, NP  chlorthalidone (HYGROTON) 25 MG tablet Take 1 tablet (25 mg total) by mouth daily. 12/19/20  Yes Rhoads, Megan P, DO  clobetasol ointment (TEMOVATE) 0.05 % Apply topically 2 (two) times daily. 09/23/20  Yes Domangue, Megan P, DO  clopidogrel (PLAVIX) 75 MG tablet Take 1 tablet (75 mg total) by mouth daily. 12/01/20  Yes Kostick, Megan P, DO  ferrous sulfate 325 (65 FE) MG tablet Take 1 tablet (325 mg total) by mouth 2 (two) times daily with a meal. Patient taking differently: Take 325 mg by mouth daily with breakfast. 12/06/19  Yes Henricks, Megan P, DO  folic acid (FOLVITE) 800 MCG tablet Take 400 mcg by mouth daily.   Yes [provider]  glucose blood test strip 1 each by Other route 2 (two) times daily. Use as instructed, Dx: E11.22 12/01/20  Yes Parkinson, Megan P, DO  Lancets MISC 1 each by Does not apply route daily. 02/11/20  Yes Nakata, Megan P, DO  levothyroxine (SYNTHROID) 50 MCG tablet TAKE 1 TABLET DAILY 12/04/20  Yes Wakeland, Megan P, DO  lisinopril (ZESTRIL) 40 MG tablet Take 1 tablet (40 mg total) by mouth daily. 12/01/20  Yes Mancuso, Megan P, DO  metFORMIN (GLUCOPHAGE-XR) 500 MG 24 hr tablet TAKE 4 TABLETS(2,$RemoveBeforeDE'000MG'bICrTKRQwphgwVS$      TOTAL) DAILY WITH BREAKFAST 12/01/20  Yes Babula, Megan P, DO  rosuvastatin (CRESTOR) 10 MG tablet Take 1 tablet (10 mg total) by mouth daily. 12/01/20  Yes Maresh, Megan P, DO  vitamin B-12 (CYANOCOBALAMIN) 1000 MCG tablet Take 1,000 mcg by mouth daily.   Yes [provider]     Family History  Problem Relation Age of Onset   Hypertension Mother    Thyroid disease Mother    Glaucoma Mother    Coronary artery disease Mother    Stroke Father    Diabetes Brother    Hypertension Maternal Grandfather    Breast cancer Neg Hx      Social History   Tobacco Use   Smoking status: Former    Packs/day: 2.00    Types: Cigarettes    Quit date: 01/1965    Years since quitting: 56.3   Smokeless tobacco: Never  Vaping Use   Vaping Use: Never used  Substance Use Topics   Alcohol use: No    Alcohol/week: 0.0 standard drinks   Drug use: No    Allergies as of 04/25/2021 - Review Complete 04/25/2021  Allergen Reaction Noted   Amlodipine Other (See Comments) 11/28/2020   Atorvastatin  11/28/2017   Evista [raloxifene] Nausea Only 04/03/2015    Physical Examination:  Constitutional: General:   Alert,  Well-developed, well-nourished, pleasant and cooperative in NAD BP (!) 152/74   Pulse (!) 51   Temp 97.8 F (36.6 C) (Oral)   Ht $R'5\' 1"'cG$  (1.549 m)   Wt 166 lb 12.8 oz (75.7 kg)   LMP  (LMP Unknown)   BMI 31.52 kg/m   Respiratory: Normal respiratory effort  Gastrointestinal:  Soft, non-tender and non-distended without masses, hepatosplenomegaly or hernias noted.  No guarding or rebound tenderness.     Cardiac: No clubbing or edema.  No cyanosis. Normal posterior tibial pedal pulses noted.  Psych:  Alert and cooperative. Normal mood and affect.  Musculoskeletal:  Normal gait. Head normocephalic, atraumatic. Symmetrical without gross deformities. 5/5 Lower extremity strength bilaterally.  Skin: Warm. Intact without significant lesions or rashes. No jaundice.  Neck: Supple, trachea midline  Lymph: No cervical lymphadenopathy  Psych:  Alert and oriented x3, Alert and cooperative. Normal mood and affect.  Labs: CMP     Component Value Date/Time   NA 140 04/17/2021 1141   NA 145 (H) 12/19/2020 0909   K 4.3 04/17/2021 1141   CL 106 04/17/2021 1141    CO2 24 04/17/2021 1141   GLUCOSE 90 04/17/2021 1141   BUN 32 (H) 04/17/2021 1141   BUN 24 12/19/2020 0909   CREATININE 1.10 (H) 04/17/2021 1141   CALCIUM 9.6 04/17/2021 1141   PROT 8.3 (H) 04/17/2021 1141   PROT 6.9 11/28/2020 1602   ALBUMIN 4.3 04/17/2021 1141   ALBUMIN 4.3 11/28/2020 1602   AST 19 04/17/2021 1141   ALT 17 04/17/2021 1141   ALKPHOS 76 04/17/2021 1141   BILITOT 0.2 (L) 04/17/2021 1141   BILITOT <0.2 11/28/2020 1602   GFRNONAA 53 (L) 04/17/2021 1141   GFRAA 79 05/23/2020 0819   Lab Results  Component Value Date   WBC 7.5 04/17/2021   HGB 10.7 (L) 04/17/2021  HCT 33.4 (L) 04/17/2021   MCV 95.7 04/17/2021   PLT 338 04/17/2021    Imaging Studies:   Assessment and Plan:   TAMESHIA BONNEVILLE is a 75 y.o. y/o female here for follow-up of constipation  Constipation is completely resolved after patient discontinued oral iron supplementation since work-up did not show iron deficiency  Continue follow-up with hematology for normocytic anemia work-up  Her previous colonoscopies have been incomplete and CT colonoscopy recently did not show any colonic polyp or mass  Given the patient does not have any family history of colon cancer, and a remote colonoscopy in 2001 showed a subcentimeter polyp (pathology report not available), and has had 2 incomplete colonoscopies since then, with most recent CT colonoscopy not showing any concerning lesions, the benefits of ongoing colonoscopies in this patient who will soon be 75 remains questionable.  This was discussed with her in detail.  Patient is reassured by CT colonoscopy results  If she has any new symptoms, or recurrence of her constipation, she was advised to call us back.  Otherwise she can follow-up in 1 year or earlier if needed    Dr [name]

## 2021-05-02 ENCOUNTER — Inpatient Hospital Stay (HOSPITAL_BASED_OUTPATIENT_CLINIC_OR_DEPARTMENT_OTHER): Payer: Medicare HMO | Admitting: Oncology

## 2021-05-02 ENCOUNTER — Encounter: Payer: Self-pay | Admitting: Oncology

## 2021-05-02 ENCOUNTER — Other Ambulatory Visit: Payer: Self-pay

## 2021-05-02 DIAGNOSIS — D649 Anemia, unspecified: Secondary | ICD-10-CM

## 2021-05-05 NOTE — Progress Notes (Signed)
I connected with Teresa [name] on 05/05/21 at  2:45 PM EDT by video enabled telemedicine visit and verified that I am speaking with the correct person using two identifiers.   I discussed the limitations, risks, security and privacy concerns of performing an evaluation and management service by telemedicine and the availability of in-person appointments. I also discussed with the patient that there may be a patient responsible charge related to this service. The patient expressed understanding and agreed to proceed.  Other persons participating in the visit and their role in the encounter:  none  Patient's location:  home Provider's location:  work  Risk analyst Complaint: Discuss results of blood work  History of present illness: Patient is a 75 year old female with a past medical history significant for hypertension hyperlipidemia hypothyroidism who was seen by Dr. Katherine Roan for symptoms of constipation.  As a part of her work-up she had her CBC checked which showed a low hemoglobin of 10.7 and hence she has been referred to [place].  Looking back at her prior CBCs patient has had a normal white count and a platelet count but hemoglobin has been between 10-11 since 2017 and has remained stable around that range.  There has been no improvement nor decline in her hemoglobin.    Results of blood work from 04/17/2021 were as follows: CBC showed white count of 7.5, H&H of 10.7/33.4 with an MCV of 95 and a platelet count of 338.  CMP showed mildly elevated creatinine of 1.1.  Calcium levels 9.6 total protein mildly elevated at 8.3.  B12 level is elevated at 2600.  Folate levels normal.  Reticulocyte count low at 2.  Myeloma panel showed no M protein haptoglobin normal.  TSH normal.  Serum free light chain ratio normal at 1.6 although both kappa and lambda light chains are mildly elevated.  ESR elevated at 60.  Iron saturation 19% with a ferritin of 77.   Interval history patient has baseline fatigue but denies any  new complaints at this time   Review of Systems  Constitutional:  Positive for malaise/fatigue. Negative for chills, fever and weight loss.  HENT:  Negative for congestion, ear discharge and nosebleeds.   Eyes:  Negative for blurred vision.  Respiratory:  Negative for cough, hemoptysis, sputum production, shortness of breath and wheezing.   Cardiovascular:  Negative for chest pain, palpitations, orthopnea and claudication.  Gastrointestinal:  Negative for abdominal pain, blood in stool, constipation, diarrhea, heartburn, melena, nausea and vomiting.  Genitourinary:  Negative for dysuria, flank pain, frequency, hematuria and urgency.  Musculoskeletal:  Negative for back pain, joint pain and myalgias.  Skin:  Negative for rash.  Neurological:  Negative for dizziness, tingling, focal weakness, seizures, weakness and headaches.  Endo/Heme/Allergies:  Does not bruise/bleed easily.  Psychiatric/Behavioral:  Negative for depression and suicidal ideas. The patient does not have insomnia.    Allergies  Allergen Reactions   Amlodipine Other (See Comments)    Ankle swelling   Atorvastatin     cramps   Evista [Raloxifene] Nausea Only    Past Medical History:  Diagnosis Date   Diabetes mellitus without complication (HCC)    Heart murmur    Hyperlipidemia    Hypertension    Hypothyroidism    Lichen sclerosus    Obesity    Osteopenia    Stroke [facility])    Thyroid disease     Past Surgical History:  Procedure Laterality Date   carpel tunnel     COLONOSCOPY WITH PROPOFOL N/A 08/30/2020  Procedure: COLONOSCOPY WITH PROPOFOL;  Surgeon: Virgel Manifold, MD;  Location: [facility] ENDOSCOPY;  Service: Gastroenterology;  Laterality: N/A;   ROTATOR CUFF REPAIR Left    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      Social History   Socioeconomic History   Marital status: Married    Spouse name: Not on file   Number of children: Not on file   Years of education: Not on file   Highest education level:  High school graduate  Occupational History   Occupation: retired  Tobacco Use   Smoking status: Former    Packs/day: 2.00    Types: Cigarettes    Quit date: 01/1965    Years since quitting: 56.3   Smokeless tobacco: Never  Vaping Use   Vaping Use: Never used  Substance and Sexual Activity   Alcohol use: No    Alcohol/week: 0.0 standard drinks   Drug use: No   Sexual activity: Never  Other Topics Concern   Not on file  Social History Narrative   Not on file   Social Determinants of Health   Financial Resource Strain: Low Risk    Difficulty of Paying Living Expenses: Not hard at all  Food Insecurity: No Food Insecurity   Worried About Charity fundraiser in the Last Year: Never true   [place] in the Last Year: Never true  Transportation Needs: No Transportation Needs   Lack of Transportation (Medical): No   Lack of Transportation (Non-Medical): No  Physical Activity: Inactive   Days of Exercise per Week: 0 days   Minutes of Exercise per Session: 0 min  Stress: No Stress Concern Present   Feeling of Stress : Not at all  Social Connections: Socially Integrated   Frequency of Communication with Friends and Family: More than three times a week   Frequency of Social Gatherings with Friends and Family: More than three times a week   Attends Religious Services: More than 4 times per year   Active Member of [employer] or Organizations: Yes   Attends Music therapist: More than 4 times per year   Marital Status: Married  Human resources officer Violence: Not At Risk   Fear of Current or Ex-Partner: No   Emotionally Abused: No   Physically Abused: No   Sexually Abused: No    Family History  Problem Relation Age of Onset   Hypertension Mother    Thyroid disease Mother    Glaucoma Mother    Coronary artery disease Mother    Stroke Father    Diabetes Brother    Hypertension Maternal Grandfather    Breast cancer Neg Hx      Current Outpatient Medications:     ASPIRIN 81 PO, Take by mouth daily., Disp: , Rfl:    blood glucose meter kit and supplies, Dispense based on patient and insurance preference. Use up to four times daily as directed. (FOR ICD-9 250.00, 250.01)., Disp: 1 each, Rfl: 0   chlorthalidone (HYGROTON) 25 MG tablet, Take 1 tablet (25 mg total) by mouth daily., Disp: 90 tablet, Rfl: 1   clobetasol ointment (TEMOVATE) 0.05 %, Apply topically 2 (two) times daily., Disp: 30 g, Rfl: 3   clopidogrel (PLAVIX) 75 MG tablet, Take 1 tablet (75 mg total) by mouth daily., Disp: 90 tablet, Rfl: 3   folic acid (FOLVITE) 379 MCG tablet, Take 400 mcg by mouth daily., Disp: , Rfl:    glucose blood test strip, 1 each by Other route 2 (two)  times daily. Use as instructed, Dx: E11.22, Disp: 100 each, Rfl: 12   Lancets MISC, 1 each by Does not apply route daily., Disp: 100 each, Rfl: 12   levothyroxine (SYNTHROID) 50 MCG tablet, TAKE 1 TABLET DAILY, Disp: 90 tablet, Rfl: 3   lisinopril (ZESTRIL) 40 MG tablet, Take 1 tablet (40 mg total) by mouth daily., Disp: 90 tablet, Rfl: 1   metFORMIN (GLUCOPHAGE-XR) 500 MG 24 hr tablet, TAKE 4 TABLETS(2,$RemoveBeforeDE'000MG'[id number]$      TOTAL) DAILY WITH BREAKFAST, Disp: 360 tablet, Rfl: 1   rosuvastatin (CRESTOR) 10 MG tablet, Take 1 tablet (10 mg total) by mouth daily., Disp: 90 tablet, Rfl: 1   ferrous sulfate 325 (65 FE) MG tablet, Take 1 tablet (325 mg total) by mouth 2 (two) times daily with a meal. (Patient not taking: Reported on 05/02/2021), Disp: 180 tablet, Rfl: 3   vitamin B-12 (CYANOCOBALAMIN) 1000 MCG tablet, Take 1,000 mcg by mouth daily. (Patient not taking: Reported on 05/02/2021), Disp: , Rfl:   No results found.  No images are attached to the encounter.   CMP Latest Ref Rng & Units 04/17/2021  Glucose 70 - 99 mg/dL 90  BUN 8 - 23 mg/dL 32(H)  Creatinine 0.44 - 1.00 mg/dL 1.10(H)  Sodium 135 - 145 mmol/L 140  Potassium 3.5 - 5.1 mmol/L 4.3  Chloride 98 - 111 mmol/L 106  CO2 22 - 32 mmol/L 24  Calcium 8.9 - 10.3 mg/dL 9.6   Total Protein 6.5 - 8.1 g/dL 8.3(H)  Total Bilirubin 0.3 - 1.2 mg/dL 0.2(L)  Alkaline Phos 38 - 126 U/L 76  AST 15 - 41 U/L 19  ALT 0 - 44 U/L 17   CBC Latest Ref Rng & Units 04/17/2021  WBC 4.0 - 10.5 K/uL 7.5  Hemoglobin 12.0 - 15.0 g/dL 10.7(L)  Hematocrit 36.0 - 46.0 % 33.4(L)  Platelets 150 - 400 K/uL 338    Assessment and plan: Patient is a 75 year old female referred for normocytic anemia:   Patient's hemoglobin has remained stable around 10 at least dating back to 2019.  There has been no decline not improvement in her hemoglobin over the last 3 years.  Results of anemia work-up are otherwise unremarkable.  Given the stability of her counts I am inclined to monitor this conservatively without the need for a bone marrow biopsy at this time.  We will check CBC ferritin and iron studies in 4 months and see her thereafter  Follow-up instructions: As above  I discussed the assessment and treatment plan with the patient. The patient was provided an opportunity to ask questions and all were answered. The patient agreed with the plan and demonstrated an understanding of the instructions.   The patient was advised to call back or seek an in-person evaluation if the symptoms worsen or if the condition fails to improve as anticipated.  Visit Diagnosis: 1. Normocytic anemia     Dr. Randa Evens, MD, MPH [facility] Tel- [phone] 05/05/2021 9:44 AM

## 2021-05-14 ENCOUNTER — Other Ambulatory Visit: Payer: Self-pay | Admitting: Family Medicine

## 2021-05-21 ENCOUNTER — Encounter: Payer: Medicare HMO | Admitting: Family Medicine

## 2021-05-22 ENCOUNTER — Encounter: Payer: Self-pay | Admitting: Family Medicine

## 2021-05-22 ENCOUNTER — Ambulatory Visit (INDEPENDENT_AMBULATORY_CARE_PROVIDER_SITE_OTHER): Payer: Medicare HMO | Admitting: Family Medicine

## 2021-05-22 ENCOUNTER — Other Ambulatory Visit: Payer: Self-pay

## 2021-05-22 VITALS — BP 129/72 | HR 52 | Ht 61.0 in | Wt 167.0 lb

## 2021-05-22 DIAGNOSIS — Z0000 Encounter for general adult medical examination without abnormal findings: Secondary | ICD-10-CM | POA: Diagnosis not present

## 2021-05-22 DIAGNOSIS — E782 Mixed hyperlipidemia: Secondary | ICD-10-CM

## 2021-05-22 DIAGNOSIS — I129 Hypertensive chronic kidney disease with stage 1 through stage 4 chronic kidney disease, or unspecified chronic kidney disease: Secondary | ICD-10-CM | POA: Diagnosis not present

## 2021-05-22 DIAGNOSIS — M8580 Other specified disorders of bone density and structure, unspecified site: Secondary | ICD-10-CM | POA: Diagnosis not present

## 2021-05-22 DIAGNOSIS — Z23 Encounter for immunization: Secondary | ICD-10-CM

## 2021-05-22 DIAGNOSIS — Z1231 Encounter for screening mammogram for malignant neoplasm of breast: Secondary | ICD-10-CM

## 2021-05-22 DIAGNOSIS — E039 Hypothyroidism, unspecified: Secondary | ICD-10-CM

## 2021-05-22 DIAGNOSIS — N1831 Chronic kidney disease, stage 3a: Secondary | ICD-10-CM

## 2021-05-22 DIAGNOSIS — D649 Anemia, unspecified: Secondary | ICD-10-CM | POA: Diagnosis not present

## 2021-05-22 DIAGNOSIS — H6121 Impacted cerumen, right ear: Secondary | ICD-10-CM

## 2021-05-22 DIAGNOSIS — E1122 Type 2 diabetes mellitus with diabetic chronic kidney disease: Secondary | ICD-10-CM

## 2021-05-22 DIAGNOSIS — E538 Deficiency of other specified B group vitamins: Secondary | ICD-10-CM

## 2021-05-22 DIAGNOSIS — D692 Other nonthrombocytopenic purpura: Secondary | ICD-10-CM | POA: Insufficient documentation

## 2021-05-22 DIAGNOSIS — I272 Pulmonary hypertension, unspecified: Secondary | ICD-10-CM | POA: Insufficient documentation

## 2021-05-22 LAB — URINALYSIS, ROUTINE W REFLEX MICROSCOPIC
Bilirubin, UA: NEGATIVE
Glucose, UA: NEGATIVE
Ketones, UA: NEGATIVE
Leukocytes,UA: NEGATIVE
Nitrite, UA: NEGATIVE
RBC, UA: NEGATIVE
Specific Gravity, UA: 1.025 (ref 1.005–1.030)
Urobilinogen, Ur: 0.2 mg/dL (ref 0.2–1.0)
pH, UA: 5 (ref 5.0–7.5)

## 2021-05-22 LAB — BAYER DCA HB A1C WAIVED: HB A1C (BAYER DCA - WAIVED): 6.3 % — ABNORMAL HIGH (ref 4.8–5.6)

## 2021-05-22 LAB — MICROALBUMIN, URINE WAIVED
Creatinine, Urine Waived: 200 mg/dL (ref 10–300)
Microalb, Ur Waived: 80 mg/L — ABNORMAL HIGH (ref 0–19)

## 2021-05-22 NOTE — Assessment & Plan Note (Signed)
Rechecking labs today. Await results. Treat as needed.  °

## 2021-05-22 NOTE — Progress Notes (Signed)
BP 129/72   Pulse (!) 52   Ht 5' 1" (1.549 m)   Wt 167 lb (75.8 kg)   LMP  (LMP Unknown)   BMI 31.55 kg/m    Subjective:    Patient ID: Teresa [name], female    DOB: 1945/10/26, 75 y.o.   MRN: [id number]  HPI: Teresa [name] is a 75 y.o. female presenting on 05/22/2021 for comprehensive medical examination. Current medical complaints include:  DIABETES Hypoglycemic episodes:no Polydipsia/polyuria: no Visual disturbance: no Chest pain: no Paresthesias: no Glucose Monitoring: yes  Accucheck frequency: Daily  Fasting glucose: 119-125 occasionally up to 140 Taking Insulin?: no Blood Pressure Monitoring: not checking Retinal Examination: Up to Date Foot Exam: Up to Date Diabetic Education: Completed Pneumovax: Up to Date Influenza: Up to Date Aspirin: yes  HYPERTENSION / HYPERLIPIDEMIA Satisfied with current treatment? yes Duration of hypertension: chronic BP monitoring frequency: not checking BP medication side effects: yes Past BP meds: lisinopril Duration of hyperlipidemia: chronic Cholesterol medication side effects: no Cholesterol supplements: none Past cholesterol medications: crestor Medication compliance: excellent compliance Aspirin: yes Recent stressors: no Recurrent headaches: no Visual changes: no Palpitations: no Dyspnea: no Chest pain: no Lower extremity edema: no Dizzy/lightheaded: no  HYPOTHYROIDISM Thyroid control status:controlled Satisfied with current treatment? yes Medication side effects: no Medication compliance: excellent compliance Recent dose adjustment:no Fatigue: no Cold intolerance: no Heat intolerance: no Weight gain: no Weight loss: no Constipation: no Diarrhea/loose stools: no Palpitations: no Lower extremity edema: no Anxiety/depressed mood: no  Menopausal Symptoms: no  Depression Screen done today and results listed below:  Depression screen [facility] 2/9 03/23/2021 03/19/2021 05/23/2020 03/10/2020 11/16/2019   Decreased Interest 0 0 0 1 0  Down, Depressed, Hopeless 0 0 0 0 0  PHQ - 2 Score 0 0 0 1 0  Altered sleeping - - 0 - -  Tired, decreased energy - - 0 - -  Change in appetite - - 0 - -  Feeling bad or failure about yourself  - - 0 - -  Trouble concentrating - - 0 - -  Moving slowly or fidgety/restless - - 0 - -  Suicidal thoughts - - 0 - -  PHQ-9 Score - - 0 - -  Difficult doing work/chores - - Not difficult at all - -    Past Medical History:  Past Medical History:  Diagnosis Date   Diabetes mellitus without complication ([place])    Heart murmur    Hyperlipidemia    Hypertension    Hypothyroidism    Lichen sclerosus    Obesity    Osteopenia    Stroke [facility])    Thyroid disease     Surgical History:  Past Surgical History:  Procedure Laterality Date   carpel tunnel     COLONOSCOPY WITH PROPOFOL N/A 08/30/2020   Procedure: COLONOSCOPY WITH PROPOFOL;  Surgeon: Virgel Manifold, MD;  Location: [facility] ENDOSCOPY;  Service: Gastroenterology;  Laterality: N/A;   ROTATOR CUFF REPAIR Left    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      Medications:  Current Outpatient Medications on File Prior to Visit  Medication Sig   ASPIRIN 81 PO Take by mouth daily.   blood glucose meter kit and supplies Dispense based on patient and insurance preference. Use up to four times daily as directed. (FOR ICD-9 250.00, 250.01).   chlorthalidone (HYGROTON) 25 MG tablet Take 1 tablet (25 mg total) by mouth daily.   clobetasol ointment (TEMOVATE) 0.05 % Apply topically 2 (two) times daily.  clopidogrel (PLAVIX) 75 MG tablet Take 1 tablet (75 mg total) by mouth daily.   ferrous sulfate 325 (65 FE) MG tablet Take 1 tablet (325 mg total) by mouth 2 (two) times daily with a meal.   folic acid (FOLVITE) 569 MCG tablet Take 400 mcg by mouth daily.   glucose blood test strip 1 each by Other route 2 (two) times daily. Use as instructed, Dx: E11.22   Lancets MISC 1 each by Does not apply route daily.   levothyroxine  (SYNTHROID) 50 MCG tablet TAKE 1 TABLET DAILY   lisinopril (ZESTRIL) 40 MG tablet Take 1 tablet (40 mg total) by mouth daily.   metFORMIN (GLUCOPHAGE-XR) 500 MG 24 hr tablet TAKE 4 TABLETS DAILY WITH  BREAKFAST   rosuvastatin (CRESTOR) 10 MG tablet Take 1 tablet (10 mg total) by mouth daily.   vitamin B-12 (CYANOCOBALAMIN) 1000 MCG tablet Take 1,000 mcg by mouth daily.   No current facility-administered medications on file prior to visit.    Allergies:  Allergies  Allergen Reactions   Amlodipine Other (See Comments)    Ankle swelling   Atorvastatin     cramps   Evista [Raloxifene] Nausea Only    Social History:  Social History   Socioeconomic History   Marital status: Married    Spouse name: Not on file   Number of children: Not on file   Years of education: Not on file   Highest education level: High school graduate  Occupational History   Occupation: retired  Tobacco Use   Smoking status: Former    Packs/day: 2.00    Types: Cigarettes    Quit date: 01/1965    Years since quitting: 56.4   Smokeless tobacco: Never  Vaping Use   Vaping Use: Never used  Substance and Sexual Activity   Alcohol use: No    Alcohol/week: 0.0 standard drinks   Drug use: No   Sexual activity: Never  Other Topics Concern   Not on file  Social History Narrative   Not on file   Social Determinants of Health   Financial Resource Strain: Low Risk    Difficulty of Paying Living Expenses: Not hard at all  Food Insecurity: No Food Insecurity   Worried About Charity fundraiser in the Last Year: Never true   [place] in the Last Year: Never true  Transportation Needs: No Transportation Needs   Lack of Transportation (Medical): No   Lack of Transportation (Non-Medical): No  Physical Activity: Inactive   Days of Exercise per Week: 0 days   Minutes of Exercise per Session: 0 min  Stress: No Stress Concern Present   Feeling of Stress : Not at all  Social Connections: Socially  Integrated   Frequency of Communication with Friends and Family: More than three times a week   Frequency of Social Gatherings with Friends and Family: More than three times a week   Attends Religious Services: More than 4 times per year   Active Member of [employer] or Organizations: Yes   Attends Music therapist: More than 4 times per year   Marital Status: Married  Human resources officer Violence: Not At Risk   Fear of Current or Ex-Partner: No   Emotionally Abused: No   Physically Abused: No   Sexually Abused: No   Social History   Tobacco Use  Smoking Status Former   Packs/day: 2.00   Types: Cigarettes   Quit date: 01/1965   Years since quitting: 38.4  Smokeless Tobacco Never   Social History   Substance and Sexual Activity  Alcohol Use No   Alcohol/week: 0.0 standard drinks    Family History:  Family History  Problem Relation Age of Onset   Hypertension Mother    Thyroid disease Mother    Glaucoma Mother    Coronary artery disease Mother    Stroke Father    Diabetes Brother    Hypertension Maternal Grandfather    Breast cancer Neg Hx     Past medical history, surgical history, medications, allergies, family history and social history reviewed with patient today and changes made to appropriate areas of the chart.   Review of Systems  Constitutional:  Positive for malaise/fatigue (at [facility] when she gets up to pee in the middle of the night). Negative for chills, diaphoresis, fever and weight loss.  HENT:  Positive for ear pain. Negative for congestion, ear discharge, hearing loss, nosebleeds, sinus pain, sore throat and tinnitus.   Eyes: Negative.   Respiratory: Negative.  Negative for stridor.   Cardiovascular: Negative.   Gastrointestinal: Negative.   Genitourinary: Negative.   Musculoskeletal: Negative.   Skin: Negative.   Neurological: Negative.   Endo/Heme/Allergies: Negative.   Psychiatric/Behavioral: Negative.    All other ROS negative except what  is listed above and in the HPI.      Objective:    BP 129/72   Pulse (!) 52   Ht 5' 1" (1.549 m)   Wt 167 lb (75.8 kg)   LMP  (LMP Unknown)   BMI 31.55 kg/m   Wt Readings from Last 3 Encounters:  05/22/21 167 lb (75.8 kg)  04/25/21 166 lb 12.8 oz (75.7 kg)  04/17/21 165 lb 12.8 oz (75.2 kg)    Physical Exam Vitals and nursing note reviewed.  Constitutional:      General: She is not in acute distress.    Appearance: Normal appearance. She is not ill-appearing, toxic-appearing or diaphoretic.  HENT:     Head: Normocephalic and atraumatic.     Right Ear: Tympanic membrane, ear canal and external ear normal. There is no impacted cerumen.     Left Ear: Tympanic membrane, ear canal and external ear normal. There is no impacted cerumen.     Nose: Nose normal. No congestion or rhinorrhea.     Mouth/Throat:     Mouth: Mucous membranes are moist.     Pharynx: Oropharynx is clear. No oropharyngeal exudate or posterior oropharyngeal erythema.  Eyes:     General: No scleral icterus.       Right eye: No discharge.        Left eye: No discharge.     Extraocular Movements: Extraocular movements intact.     Conjunctiva/sclera: Conjunctivae normal.     Pupils: Pupils are equal, round, and reactive to light.  Neck:     Vascular: No carotid bruit.  Cardiovascular:     Rate and Rhythm: Normal rate and regular rhythm.     Pulses: Normal pulses.     Heart sounds: No murmur heard.   No friction rub. No gallop.  Pulmonary:     Effort: Pulmonary effort is normal. No respiratory distress.     Breath sounds: Normal breath sounds. No stridor. No wheezing, rhonchi or rales.  Chest:     Chest wall: No tenderness.  Abdominal:     General: Abdomen is flat. Bowel sounds are normal. There is no distension.     Palpations: Abdomen is soft. There is no mass.  Tenderness: There is no abdominal tenderness. There is no right CVA tenderness, left CVA tenderness, guarding or rebound.     Hernia: No  hernia is present.  Genitourinary:    Comments: Breast and pelvic exams deferred with shared decision making Musculoskeletal:        General: No swelling, tenderness, deformity or signs of injury.     Cervical back: Normal range of motion and neck supple. No rigidity. No muscular tenderness.     Right lower leg: No edema.     Left lower leg: No edema.  Lymphadenopathy:     Cervical: No cervical adenopathy.  Skin:    General: Skin is warm and dry.     Capillary Refill: Capillary refill takes less than 2 seconds.     Coloration: Skin is not jaundiced or pale.     Findings: No bruising, erythema, lesion or rash.  Neurological:     General: No focal deficit present.     Mental Status: She is alert and oriented to person, place, and time. Mental status is at baseline.     Cranial Nerves: No cranial nerve deficit.     Sensory: No sensory deficit.     Motor: No weakness.     Coordination: Coordination normal.     Gait: Gait normal.     Deep Tendon Reflexes: Reflexes normal.  Psychiatric:        Mood and Affect: Mood normal.        Behavior: Behavior normal.        Thought Content: Thought content normal.        Judgment: Judgment normal.    Results for orders placed or performed in visit on 05/22/21  Bayer DCA Hb A1c Waived  Result Value Ref Range   HB A1C (BAYER DCA - WAIVED) 6.3 (H) 4.8 - 5.6 %  Microalbumin, Urine Waived  Result Value Ref Range   Microalb, Ur Waived 80 (H) 0 - 19 mg/L   Creatinine, Urine Waived 200 10 - 300 mg/dL   Microalb/Creat Ratio 30-300 (H) <30 mg/g  Urinalysis, Routine w reflex microscopic  Result Value Ref Range   Specific Gravity, UA 1.025 1.005 - 1.030   pH, UA 5.0 5.0 - 7.5   Color, UA Yellow Yellow   Appearance Ur Clear Clear   Leukocytes,UA Negative Negative   Protein,UA Trace (A) Negative/Trace   Glucose, UA Negative Negative   Ketones, UA Negative Negative   RBC, UA Negative Negative   Bilirubin, UA Negative Negative   Urobilinogen, Ur  0.2 0.2 - 1.0 mg/dL   Nitrite, UA Negative Negative      Assessment & Plan:   Problem List Items Addressed This Visit       Cardiovascular and Mediastinum   Pulmonary HTN (HCC)    Stable. Continue to monitor.       Senile purpura ([place])    Reassured patient. Continue to monitor.         Endocrine   Hypothyroidism    Rechecking labs today. Await results. Treat as needed.       Relevant Orders   CBC with Differential/Platelet   Comprehensive metabolic panel   TSH   Type 2 diabetes mellitus with stage 3 chronic kidney disease, without long-term current use of insulin ([place])    Doing well with A1c of 6.3. Continue current regimen. Call with any concerns.       Relevant Orders   Bayer DCA Hb A1c Waived (Completed)   CBC with Differential/Platelet  Comprehensive metabolic panel   Microalbumin, Urine Waived (Completed)     Musculoskeletal and Integument   Osteopenia    Rechecking labs today. Will check bone density next year.       Relevant Orders   CBC with Differential/Platelet   Comprehensive metabolic panel   VITAMIN D 25 Hydroxy (Vit-D Deficiency, Fractures)     Genitourinary   Hypertensive CKD (chronic kidney disease)    Under good control on current regimen. Continue current regimen. Continue to monitor. Call with any concerns. Refills given. Labs drawn today.       Relevant Orders   CBC with Differential/Platelet   Comprehensive metabolic panel   Microalbumin, Urine Waived (Completed)   CKD (chronic kidney disease), stage III ([place])    Rechecking labs today. Await results. Treat as needed.       Relevant Orders   CBC with Differential/Platelet   Comprehensive metabolic panel   Urinalysis, Routine w reflex microscopic (Completed)     Other   Hyperlipidemia    Under good control on current regimen. Continue current regimen. Continue to monitor. Call with any concerns. Refills given. Labs drawn today.       Relevant Orders   CBC with  Differential/Platelet   Comprehensive metabolic panel   Lipid Panel w/o Chol/HDL Ratio   B12 deficiency    Rechecking labs today. Await results. Treat as needed.       Relevant Orders   CBC with Differential/Platelet   Comprehensive metabolic panel   T51   Anemia    Rechecking labs today. Await results. Treat as needed.       Relevant Orders   CBC with Differential/Platelet   Comprehensive metabolic panel   Iron and TIBC   Other Visit Diagnoses     Routine general medical examination at a health care facility    -  Primary   Vaccines up to date. Screening labs checked today. Mammogram ordered. Continue diet and exercise. Call with any concerns.    Encounter for screening mammogram for malignant neoplasm of breast       Mammogram ordered today.   Relevant Orders   MM 3D SCREEN BREAST BILATERAL   Impacted cerumen of right ear       Flushed today with good results.    Need for immunization against influenza       Flu shot given.    Relevant Orders   Flu Vaccine QUAD High Dose(Fluad) (Completed)        Follow up plan: Return in about 6 months (around 11/20/2021).   LABORATORY TESTING:  - Pap smear: not applicable  IMMUNIZATIONS:   - Tdap: Tetanus vaccination status reviewed: last tetanus booster within 10 years. - Influenza: Administered today - Pneumovax: Up to date - Prevnar: Up to date - COVID: Up to date  SCREENING: -Mammogram: Ordered today  - Colonoscopy: Up to date   PATIENT COUNSELING:   Advised to take 1 mg of folate supplement per day if capable of pregnancy.   Sexuality: Discussed sexually transmitted diseases, partner selection, use of condoms, avoidance of unintended pregnancy  and contraceptive alternatives.   Advised to avoid cigarette smoking.  I discussed with the patient that most people either abstain from alcohol or drink within safe limits (<=14/week and <=4 drinks/occasion for males, <=7/weeks and <= 3 drinks/occasion for females) and  that the risk for alcohol disorders and other health effects rises proportionally with the number of drinks per week and how often a drinker exceeds daily limits.  Discussed cessation/primary prevention of drug use and availability of treatment for abuse.   Diet: Encouraged to adjust caloric intake to maintain  or achieve ideal body weight, to reduce intake of dietary saturated fat and total fat, to limit sodium intake by avoiding high sodium foods and not adding table salt, and to maintain adequate dietary potassium and calcium preferably from fresh fruits, vegetables, and low-fat dairy products.    stressed the importance of regular exercise  Injury prevention: Discussed safety belts, safety helmets, smoke detector, smoking near bedding or upholstery.   Dental health: Discussed importance of regular tooth brushing, flossing, and dental visits.    NEXT PREVENTATIVE PHYSICAL DUE IN 1 YEAR. Return in about 6 months (around 11/20/2021).

## 2021-05-22 NOTE — Assessment & Plan Note (Signed)
Stable.       - Continue to monitor

## 2021-05-22 NOTE — Assessment & Plan Note (Signed)
Doing well with A1c of 6.3. Continue current regimen. Call with any concerns.

## 2021-05-22 NOTE — Assessment & Plan Note (Signed)
Rechecking labs today. Will check bone density next year.

## 2021-05-22 NOTE — Assessment & Plan Note (Signed)
Reassured patient. Continue to monitor.  

## 2021-05-22 NOTE — Assessment & Plan Note (Signed)
Under good control on current regimen. Continue current regimen. Continue to monitor. Call with any concerns. Refills given. Labs drawn today.   

## 2021-05-23 LAB — COMPREHENSIVE METABOLIC PANEL
ALT: 15 IU/L (ref 0–32)
AST: 16 IU/L (ref 0–40)
Albumin/Globulin Ratio: 1.9 (ref 1.2–2.2)
Albumin: 4.5 g/dL (ref 3.7–4.7)
Alkaline Phosphatase: 71 IU/L (ref 44–121)
BUN/Creatinine Ratio: 28 (ref 12–28)
BUN: 31 mg/dL — ABNORMAL HIGH (ref 8–27)
Bilirubin Total: <0.2 mg/dL (ref 0.0–1.2)
CO2: 20 mmol/L (ref 20–29)
Calcium: 9.8 mg/dL (ref 8.7–10.3)
Chloride: 108 mmol/L — ABNORMAL HIGH (ref 96–106)
Creatinine, Ser: 1.1 mg/dL — ABNORMAL HIGH (ref 0.57–1.00)
Globulin, Total: 2.4 g/dL (ref 1.5–4.5)
Glucose: 121 mg/dL — ABNORMAL HIGH (ref 70–99)
Potassium: 4.6 mmol/L (ref 3.5–5.2)
Sodium: 145 mmol/L — ABNORMAL HIGH (ref 134–144)
Total Protein: 6.9 g/dL (ref 6.0–8.5)
eGFR: 53 mL/min/{1.73_m2} — ABNORMAL LOW (ref >59)

## 2021-05-23 LAB — CBC WITH DIFFERENTIAL/PLATELET
Basophils Absolute: 0 10*3/uL (ref 0.0–0.2)
Basos: 1 %
EOS (ABSOLUTE): 0.1 10*3/uL (ref 0.0–0.4)
Eos: 1 %
Hematocrit: 32 % — ABNORMAL LOW (ref 34.0–46.6)
Hemoglobin: 10.4 g/dL — ABNORMAL LOW (ref 11.1–15.9)
Immature Grans (Abs): 0 10*3/uL (ref 0.0–0.1)
Immature Granulocytes: 0 %
Lymphocytes Absolute: 2 10*3/uL (ref 0.7–3.1)
Lymphs: 27 %
MCH: 30.1 pg (ref 26.6–33.0)
MCHC: 32.5 g/dL (ref 31.5–35.7)
MCV: 93 fL (ref 79–97)
Monocytes Absolute: 0.5 10*3/uL (ref 0.1–0.9)
Monocytes: 7 %
Neutrophils Absolute: 4.8 10*3/uL (ref 1.4–7.0)
Neutrophils: 64 %
Platelets: 265 10*3/uL (ref 150–450)
RBC: 3.45 x10E6/uL — ABNORMAL LOW (ref 3.77–5.28)
RDW: 11.9 % (ref 11.7–15.4)
WBC: 7.5 10*3/uL (ref 3.4–10.8)

## 2021-05-23 LAB — LIPID PANEL W/O CHOL/HDL RATIO
Cholesterol, Total: 126 mg/dL (ref 100–199)
HDL: 49 mg/dL (ref >39)
LDL Chol Calc (NIH): 60 mg/dL (ref 0–99)
Triglycerides: 86 mg/dL (ref 0–149)
VLDL Cholesterol Cal: 17 mg/dL (ref 5–40)

## 2021-05-23 LAB — IRON AND TIBC
Iron Saturation: 24 % (ref 15–55)
Iron: 68 ug/dL (ref 27–139)
Total Iron Binding Capacity: 278 ug/dL (ref 250–450)
UIBC: 210 ug/dL (ref 118–369)

## 2021-05-23 LAB — VITAMIN B12: Vitamin B-12: 309 pg/mL (ref 232–1245)

## 2021-05-23 LAB — TSH: TSH: 1.42 u[IU]/mL (ref 0.450–4.500)

## 2021-05-23 LAB — VITAMIN D 25 HYDROXY (VIT D DEFICIENCY, FRACTURES): Vit D, 25-Hydroxy: 20.9 ng/mL — ABNORMAL LOW (ref 30.0–100.0)

## 2021-05-25 ENCOUNTER — Telehealth: Payer: Self-pay

## 2021-05-25 ENCOUNTER — Ambulatory Visit (INDEPENDENT_AMBULATORY_CARE_PROVIDER_SITE_OTHER): Payer: Medicare HMO

## 2021-05-25 ENCOUNTER — Telehealth: Payer: Medicare HMO | Admitting: General Practice

## 2021-05-25 DIAGNOSIS — N1831 Chronic kidney disease, stage 3a: Secondary | ICD-10-CM

## 2021-05-25 DIAGNOSIS — E782 Mixed hyperlipidemia: Secondary | ICD-10-CM

## 2021-05-25 DIAGNOSIS — I10 Essential (primary) hypertension: Secondary | ICD-10-CM

## 2021-05-25 DIAGNOSIS — E1122 Type 2 diabetes mellitus with diabetic chronic kidney disease: Secondary | ICD-10-CM

## 2021-05-25 NOTE — Chronic Care Management (AMB) (Signed)
Chronic Care Management   CCM RN Visit Note  05/25/2021 Name: Teresa [name] MRN: [id number] DOB: May 30, 1946  Subjective: Teresa [name] is a 75 y.o. year old female who is a primary care patient of Mersadez, [place], DO. The care management team was consulted for assistance with disease management and care coordination needs.    Engaged with patient by telephone for follow up visit in response to provider referral for case management and/or care coordination services.   Consent to Services:  The patient was given information about Chronic Care Management services, agreed to services, and gave verbal consent prior to initiation of services.  Please see initial visit note for detailed documentation.   Patient agreed to services and verbal consent obtained.   Assessment: Review of patient past medical history, allergies, medications, health status, including review of consultants reports, laboratory and other test data, was performed as part of comprehensive evaluation and provision of chronic care management services.   SDOH (Social Determinants of Health) assessments and interventions performed:    CCM Care Plan  Allergies  Allergen Reactions   Amlodipine Other (See Comments)    Ankle swelling   Atorvastatin     cramps   Evista [Raloxifene] Nausea Only    Outpatient Encounter Medications as of 05/25/2021  Medication Sig   ASPIRIN 81 PO Take by mouth daily.   blood glucose meter kit and supplies Dispense based on patient and insurance preference. Use up to four times daily as directed. (FOR ICD-9 250.00, 250.01).   chlorthalidone (HYGROTON) 25 MG tablet Take 1 tablet (25 mg total) by mouth daily.   clobetasol ointment (TEMOVATE) 0.05 % Apply topically 2 (two) times daily.   clopidogrel (PLAVIX) 75 MG tablet Take 1 tablet (75 mg total) by mouth daily.   ferrous sulfate 325 (65 FE) MG tablet Take 1 tablet (325 mg total) by mouth 2 (two) times daily with a meal.   folic acid  (FOLVITE) 132 MCG tablet Take 400 mcg by mouth daily.   glucose blood test strip 1 each by Other route 2 (two) times daily. Use as instructed, Dx: E11.22   Lancets MISC 1 each by Does not apply route daily.   levothyroxine (SYNTHROID) 50 MCG tablet TAKE 1 TABLET DAILY   lisinopril (ZESTRIL) 40 MG tablet Take 1 tablet (40 mg total) by mouth daily.   metFORMIN (GLUCOPHAGE-XR) 500 MG 24 hr tablet TAKE 4 TABLETS DAILY WITH  BREAKFAST   rosuvastatin (CRESTOR) 10 MG tablet Take 1 tablet (10 mg total) by mouth daily.   vitamin B-12 (CYANOCOBALAMIN) 1000 MCG tablet Take 1,000 mcg by mouth daily.   No facility-administered encounter medications on file as of 05/25/2021.    Patient Active Problem List   Diagnosis Date Noted   Pulmonary HTN ([place]) 05/22/2021   Senile purpura ([place]) 05/22/2021   History of colonic polyps 05/18/2020   Bradycardia 02/11/2018   Lumbar radiculopathy 09/27/2017   Advanced care planning/counseling discussion 01/22/2017   Type 2 diabetes mellitus with stage 3 chronic kidney disease, without long-term current use of insulin ([place]) 07/04/2015   CVA (cerebral infarction) 04/03/2015   Obesity 44/08/270   Lichen sclerosus 53/66/4403   Hypothyroidism 04/03/2015   Osteopenia 04/03/2015   Hyperlipidemia 04/03/2015   B12 deficiency 04/03/2015   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   CKD (chronic kidney disease), stage III ([place]) 04/03/2015   Anemia 03/10/2014    Conditions to be addressed/monitored:HTN, HLD, and DMII  Care Plan : RNCM: Diabetes Type 2 (Adult)  Updates made by Vanita Ingles, RN since 05/25/2021 12:00 AM     Problem: RNCM: Glycemic Management (Diabetes, Type 2)   Priority: Medium     Long-Range Goal: RNCM: Glycemic Management Optimized   Start Date: 08/25/2020  Expected End Date: 03/18/2022  Recent Progress: On track  Priority: Medium  Note:   Objective:  Lab Results  Component Value Date   HGBA1C 6.3 (H) 05/22/2021   Lab Results   Component Value Date   CREATININE 1.10 (H) 05/22/2021   CREATININE 1.10 (H) 04/17/2021   CREATININE 0.95 12/19/2020   No results found for: EGFR Current Barriers:  Knowledge Deficits related to basic Diabetes pathophysiology and self care/management Knowledge Deficits related to medications used for management of diabetes Limited Social Support Unable to independently manage DM2 Does not contact provider office for questions/concerns Case Manager Clinical Goal(s):  Collaboration with [name], DO regarding development and update of comprehensive plan of care as evidenced by provider attestation and co-signature Inter-disciplinary care team collaboration (see longitudinal plan of care)  patient will demonstrate improved adherence to prescribed treatment plan for diabetes self care/management as evidenced by:  daily monitoring and recording of CBG  adherence to ADA/ carb modified diet adherence to prescribed medication regimen Interventions:  Provided education to patient about basic DM disease process Reviewed medications with patient and discussed importance of medication adherence. 05-25-2021: The patient is compliant with medications Discussed plans with patient for ongoing care management follow up and provided patient with direct contact information for care management team Provided patient with written educational materials related to hypo and hyperglycemia and importance of correct treatment Reviewed scheduled/upcoming provider appointments including:11-20-2021 at [place] am Advised patient, providing education and rationale, to check cbg daily  and record, calling pcp for findings outside established parameters.  03-23-2021: The patient states that her blood sugar was 140 this am. Has been off the last few days due to prep for her colonoscopy. The patient knows what to do for hypoglycemic events. States she feels fine. Is tolerating her diet. Will continue to monitor. 05-25-2021:  The patient states that her blood sugars are staying the same. Denies any fluctuations. States she is compliant with the plan of care and doing well.  Review of patient status, including review of consultants reports, relevant laboratory and other test results, and medications completed. Patient Goals/Self-Care Activities  patient will:  - Self administers oral medications as prescribed Self administers insulin as prescribed Attends all scheduled provider appointments Checks blood sugars as prescribed and utilize hyper and hypoglycemia protocol as needed Adheres to prescribed ADA/carb modified - barriers to adherence to treatment plan identified - blood glucose monitoring encouraged - blood glucose readings reviewed - self-awareness of signs/symptoms of hypo or hyperglycemia encouraged - use of blood glucose monitoring log promoted Follow Up Plan: Telephone follow up appointment with care management team member scheduled for: 08-10-2021 at 0945 am    Care Plan : RNCM: Hypertension (Adult)  Updates made by Vanita Ingles, RN since 05/25/2021 12:00 AM     Problem: RNCM: Hypertension (Hypertension)   Priority: Medium     Long-Range Goal: RNCM: Hypertension Monitored   Start Date: 08/25/2020  Expected End Date: 03/18/2022  This Visit's Progress: On track  Recent Progress: On track  Priority: Medium  Note:   Objective:  Last practice recorded BP readings:  BP Readings from Last 3 Encounters:  05/22/21 129/72  04/25/21 (!) 152/74  04/17/21 (!) 144/46   Most recent eGFR/CrCl: No results found  for: EGFR  No components found for: CRCL Current Barriers:  Knowledge Deficits related to basic understanding of hypertension pathophysiology and self care management Knowledge Deficits related to understanding of medications prescribed for management of hypertension Limited Social Support Unable to independently manage HTN Lacks social connections Does not contact provider office for  questions/concerns Case Manager Clinical Goal(s):   patient will verbalize understanding of plan for hypertension management  patient will attend all scheduled medical appointments: 11-20-2021  patient will demonstrate improved adherence to prescribed treatment plan for hypertension as evidenced by taking all medications as prescribed, monitoring and recording blood pressure as directed, adhering to low sodium/DASH diet  patient will demonstrate improved health management independence as evidenced by checking blood pressure as directed and notifying PCP if SBP>160 or DBP > 90, taking all medications as prescribe, and adhering to a low sodium diet as discussed. Interventions:  Collaboration with [name], DO regarding development and update of comprehensive plan of care as evidenced by provider attestation and co-signature Inter-disciplinary care team collaboration (see longitudinal plan of care) Evaluation of current treatment plan related to hypertension self management and patient's adherence to plan as established by provider. 03-23-2021: The patients pressure has been a little elevated. She has been a little stressed due to her colonoscopy she had on 03-22-2021 and pending test results. The patient denies any acute distress and states it is better since her procedure. States she feels fine and will be better once she receives the results from her colonoscopy. Will continue to monitor for changes. 05-25-2021: The patient has had several follow up appointments. The patient states she thinks she only has a couple more and she will be finished for the year. She denies any acute distress. She says her blood pressures are better now and she denies any issues with HTN. States when her blood pressures are up she knows exactly why they are up and how to get it to come back down. Will continue to monitor.  Provided education to patient re: stroke prevention, s/s of heart attack and stroke, DASH diet,  complications of uncontrolled blood pressure Reviewed medications with patient and discussed importance of compliance. 05-25-2021: Is compliant with medications Discussed plans with patient for ongoing care management follow up and provided patient with direct contact information for care management team Advised patient, providing education and rationale, to monitor blood pressure daily and record, calling PCP for findings outside established parameters.  Reviewed scheduled/upcoming provider appointments including: 11-20-2021 at [place] am Patient Goals/Self-Care Activities patient will:  - Self administers medications as prescribed Attends all scheduled provider appointments Calls provider office for new concerns, questions, or BP outside discussed parameters Checks BP and records as discussed Follows a low sodium diet/DASH diet - blood pressure trends reviewed - depression screen reviewed - home or ambulatory blood pressure monitoring encouraged Follow Up Plan: Telephone follow up appointment with care management team member scheduled for: 08-10-2021 0945 am    Care Plan : RNCM: HLD  Updates made by Vanita Ingles, RN since 05/25/2021 12:00 AM     Problem: HLD: Health Promotion or Disease Self-Management (General Plan of Care)   Priority: Medium     Long-Range Goal: RNCM: HLD Self-Management Plan Developed   Start Date: 08/25/2020  Expected End Date: 03/18/2022  This Visit's Progress: On track  Recent Progress: On track  Priority: Medium  Note:   Current Barriers:  Poorly controlled hyperlipidemia, complicated by HTN Current antihyperlipidemic regimen: Crestor $RemoveBeforeDE'10mg'DMnzhiKNwMiEJPR$  QD Most recent lipid panel:  Component Value Date/Time   CHOL 126 05/22/2021 0934   CHOL 147 07/04/2015 0820   TRIG 86 05/22/2021 0934   TRIG 89 07/04/2015 0820   HDL 49 05/22/2021 0934   VLDL 18 07/04/2015 0820   LDLCALC 60 05/22/2021 0934   ASCVD risk enhancing conditions: age >56, DM, HTN, CKD, CHF, current  smoker Unable to independently HLD Does not contact provider office for questions/concerns  RN Care Manager Clinical Goal(s):   patient will work with Consulting civil engineer, providers, and care team towards execution of optimized self-health management plan patient will verbalize understanding of plan for effectively managing HLD patient will work with [facility] and pcp to address needs related to management of HLD patient will attend all scheduled medical appointments: 11-20-2020 at 0820 am  Interventions: Collaboration with [name], DO regarding development and update of comprehensive plan of care as evidenced by provider attestation and co-signature Inter-disciplinary care team collaboration (see longitudinal plan of care) Medication review performed; medication list updated in electronic medical record.  Inter-disciplinary care team collaboration (see longitudinal plan of care) Referred to pharmacy team for assistance with HLD medication management Evaluation of current treatment plan related to HLD and patient's adherence to plan as established by provider. 05-25-2021: The patient is doing well and denies any new concerns with her HLD.  Advised patient to call the office for new questions or concerns  Reviewed medications with patient and discussed compliance  Discussed plans with patient for ongoing care management follow up and provided patient with direct contact information for care management team   Patient Goals/Self-Care Activities: patient will:   - call for medicine refill 2 or 3 days before it runs out - call if I am sick and can't take my medicine - keep a list of all the medicines I take; vitamins and herbals too - learn to read medicine labels - use a pillbox to sort medicine - use an alarm clock or phone to remind me to take my medicine - change to whole grain breads, cereal, pasta - drink 6 to 8 glasses of water each day - eat 3 to 5 servings of fruits and vegetables  each day - eat 5 or 6 small meals each day - fill half the plate with nonstarchy vegetables - limit fast food meals to no more than 1 per week - manage portion size - prepare main meal at home 3 to 5 days each week - read food labels for fat, fiber, carbohydrates and portion size - be open to making changes - I can manage, know and watch for signs of a heart attack - if I have chest pain, call for help - learn about small changes that will make a big difference - learn my personal risk factors - barriers to meeting goals identified - change-talk evoked - choices provided - decision-making supported - problem-solving facilitated - questions answered - reassurance provided - self-reflection promoted - self-reliance encouraged   Follow Up Plan: Telephone follow up appointment with care management team member scheduled for: 08-10-2021 and 0945 am       Plan:Telephone follow up appointment with care management team member scheduled for:  08-10-2021 at [place] am  Noreene Larsson RN, MSN, [facility] Mobile: [phone]

## 2021-05-25 NOTE — Telephone Encounter (Signed)
  Care Management   Follow Up Note   05/25/2021 Name: Teresa [name] MRN: [id number] DOB: 06/28/1946   Referred by: [name], DO Reason for referral : Chronic Care Management (RNCM: Follow up for Chronic Disease Management and Care Coordination Needs )   Patient called the RNCM back and left a message with staff that she wanted a call back. See new encounter for completed call.   Follow Up Plan: Telephone follow up appointment with care management team member scheduled for: 08-10-2021 at [place] am  Noreene Larsson RN, MSN, [facility] Mobile: [phone]

## 2021-05-25 NOTE — Telephone Encounter (Signed)
Patient returned call from Chronic care. Please call back

## 2021-05-25 NOTE — Patient Instructions (Signed)
Visit Information  PATIENT GOALS:  Goals Addressed             This Visit's Progress    RNCM: Lifestyle Change-Hypertension       Timeframe:  Long-Range Goal Priority:  Medium Start Date:   05-25-2021                          Expected End Date:      05-25-2022                 Follow Up Date 08-10-2021   - agree on reward when goals are met - agree to work together to make changes - ask questions to understand - have a family meeting to talk about healthy habits - learn about high blood pressure    Why is this important?   The changes that you are asked to make may be hard to do.  This is especially true when the changes are life-long.  Knowing why it is important to you is the first step.  Working on the change with your family or support person helps you not feel alone.  Reward yourself and family or support person when goals are met. This can be an activity you choose like bowling, hiking, biking, swimming or shooting hoops.     05-25-2021: The patient states that she sometimes doesn't always eat like she should. Blood pressure is doing well. Denies any changes in her blood pressures. Has had several appointments lately and doing well. Says she is ready for appointments to be over for the year. Knows to call the office for changes.         Patient verbalizes understanding of instructions provided today and agrees to view in [place].   Telephone follow up appointment with care management team member scheduled for:08-10-2021 at [place] am  Noreene Larsson RN, MSN, [facility] Mobile: [phone]

## 2021-06-11 DIAGNOSIS — E782 Mixed hyperlipidemia: Secondary | ICD-10-CM

## 2021-06-11 DIAGNOSIS — I10 Essential (primary) hypertension: Secondary | ICD-10-CM

## 2021-06-11 DIAGNOSIS — E1122 Type 2 diabetes mellitus with diabetic chronic kidney disease: Secondary | ICD-10-CM | POA: Diagnosis not present

## 2021-06-11 DIAGNOSIS — N1831 Chronic kidney disease, stage 3a: Secondary | ICD-10-CM

## 2021-06-19 ENCOUNTER — Ambulatory Visit
Admission: RE | Admit: 2021-06-19 | Discharge: 2021-06-19 | Disposition: A | Discharge status: Home / Self Care | Payer: Medicare HMO | Source: Ambulatory Visit | Attending: Family Medicine | Admitting: Family Medicine

## 2021-06-19 ENCOUNTER — Other Ambulatory Visit: Payer: Self-pay

## 2021-06-19 DIAGNOSIS — Z1231 Encounter for screening mammogram for malignant neoplasm of breast: Secondary | ICD-10-CM | POA: Diagnosis not present

## 2021-06-29 ENCOUNTER — Other Ambulatory Visit: Payer: Self-pay | Admitting: Family Medicine

## 2021-06-29 MED ORDER — CHLORTHALIDONE 25 MG PO TABS: 25.0000 mg | ORAL_TABLET | Freq: Every day | ORAL | 1 refills | Status: DC

## 2021-06-29 NOTE — Telephone Encounter (Signed)
Requested Prescriptions  Pending Prescriptions Disp Refills  . chlorthalidone (HYGROTON) 25 MG tablet 90 tablet 1    Sig: Take 1 tablet (25 mg total) by mouth daily.     Cardiovascular: Diuretics - Thiazide Failed - 06/29/2021  9:12 AM      Failed - Cr in normal range and within 360 days    Creatinine, Ser  Date Value Ref Range Status  05/22/2021 1.10 (H) 0.57 - 1.00 mg/dL Final         Failed - Na in normal range and within 360 days    Sodium  Date Value Ref Range Status  05/22/2021 145 (H) 134 - 144 mmol/L Final         Passed - Ca in normal range and within 360 days    Calcium  Date Value Ref Range Status  05/22/2021 9.8 8.7 - 10.3 mg/dL Final         Passed - K in normal range and within 360 days    Potassium  Date Value Ref Range Status  05/22/2021 4.6 3.5 - 5.2 mmol/L Final         Passed - Last BP in normal range    BP Readings from Last 1 Encounters:  05/22/21 129/72         Passed - Valid encounter within last 6 months    Recent Outpatient Visits          1 month ago Routine general medical examination at a health care facility   [facility], [place] P, DO   2 months ago Viral upper respiratory tract infection   [facility] Jon Billings, NP   6 months ago 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

## 2021-06-29 NOTE — Telephone Encounter (Signed)
Medication: chlorthalidone (HYGROTON) 25 MG tablet [[id number]]   Has the patient contacted their pharmacy? YES  (Agent: If no, request that the patient contact the pharmacy for the refill. If patient does not wish to contact the pharmacy document the reason why and proceed with request.) (Agent: If yes, when and what did the pharmacy advise?)  Preferred Pharmacy (with phone number or street name): [facility] [place] [place] [facility] [address] [place] [address] Phone: [phone] Fax: [phone] Hours: Not open 24 hours   Has the patient been seen for an appointment in the last year OR does the patient have an upcoming appointment? YES 05/22/21  Agent: Please be advised that RX refills may take up to 3 business days. We ask that you follow-up with your pharmacy.

## 2021-07-03 ENCOUNTER — Other Ambulatory Visit: Payer: Self-pay | Admitting: Family Medicine

## 2021-07-03 NOTE — Telephone Encounter (Signed)
Requested Prescriptions  Pending Prescriptions Disp Refills  . rosuvastatin (CRESTOR) 10 MG tablet [Pharmacy Med Name: ROSUVASTATIN TAB 10MG ] 90 tablet 1    Sig: TAKE 1 TABLET DAILY     Cardiovascular:  Antilipid - Statins Passed - 07/03/2021  2:18 AM      Passed - Total Cholesterol in normal range and within 360 days    Cholesterol, Total  Date Value Ref Range Status  05/22/2021 126 100 - 199 mg/dL Final   Cholesterol Piccolo, Waived  Date Value Ref Range Status  07/04/2015 147 <200 mg/dL Final    Comment:                            Desirable                <200                         Borderline High      200- 239                         High                     >239          Passed - LDL in normal range and within 360 days    LDL Chol Calc (NIH)  Date Value Ref Range Status  05/22/2021 60 0 - 99 mg/dL Final         Passed - HDL in normal range and within 360 days    HDL  Date Value Ref Range Status  05/22/2021 49 >39 mg/dL Final         Passed - Triglycerides in normal range and within 360 days    Triglycerides  Date Value Ref Range Status  05/22/2021 86 0 - 149 mg/dL Final   Triglycerides Piccolo,Waived  Date Value Ref Range Status  07/04/2015 89 <150 mg/dL Final    Comment:                            Normal                   <150                         Borderline High     150 - 199                         High                200 - 499                         Very High                >499          Passed - Patient is not pregnant      Passed - Valid encounter within last 12 months    Recent Outpatient Visits          1 month ago Routine general medical examination at a health care facility   [facility], [place] P, DO   2 months ago Viral upper respiratory tract infection   [place],  [place], NP   6 months ago 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

## 2021-07-30 ENCOUNTER — Other Ambulatory Visit: Payer: Self-pay | Admitting: Family Medicine

## 2021-07-30 NOTE — Telephone Encounter (Signed)
Requested Prescriptions  Pending Prescriptions Disp Refills   lisinopril (ZESTRIL) 40 MG tablet [Pharmacy Med Name: LISINOPRIL TAB 40MG ] 90 tablet 1    Sig: TAKE 1 TABLET DAILY     Cardiovascular:  ACE Inhibitors Failed - 07/30/2021  9:00 AM      Failed - Cr in normal range and within 180 days    Creatinine, Ser  Date Value Ref Range Status  05/22/2021 1.10 (H) 0.57 - 1.00 mg/dL Final         Passed - K in normal range and within 180 days    Potassium  Date Value Ref Range Status  05/22/2021 4.6 3.5 - 5.2 mmol/L Final         Passed - Patient is not pregnant      Passed - Last BP in normal range    BP Readings from Last 1 Encounters:  05/22/21 129/72         Passed - Valid encounter within last 6 months    Recent Outpatient Visits          2 months ago Routine general medical examination at a health care facility   [facility], [place] P, DO   3 months ago Viral upper respiratory tract infection   [facility] Jon Billings, NP   7 months ago 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

## 2021-08-09 ENCOUNTER — Telehealth: Payer: Self-pay | Admitting: Family Medicine

## 2021-08-10 ENCOUNTER — Telehealth: Payer: Medicare HMO

## 2021-08-10 ENCOUNTER — Telehealth: Payer: Self-pay

## 2021-08-10 NOTE — Telephone Encounter (Signed)
°  Care Management   Follow Up Note   08/10/2021 Name: Teresa [name] MRN: [id number] DOB: 03/19/1946   Referred by: [name], DO Reason for referral : Chronic Care Management (RNCM: Follow up for Chronic Disease Management and Care Coordination Needs- Attempt)   An unsuccessful telephone outreach was attempted today. The patient was referred to the case management team for assistance with care management and care coordination.   Follow Up Plan: A HIPPA compliant phone message was left for the patient providing contact information and requesting a return call.   Noreene Larsson RN, MSN, [facility] Mobile: [phone]

## 2021-08-12 ENCOUNTER — Other Ambulatory Visit: Payer: Self-pay | Admitting: Family Medicine

## 2021-08-14 ENCOUNTER — Other Ambulatory Visit: Payer: Self-pay | Admitting: Family Medicine

## 2021-08-14 DIAGNOSIS — E1122 Type 2 diabetes mellitus with diabetic chronic kidney disease: Secondary | ICD-10-CM

## 2021-08-14 NOTE — Telephone Encounter (Signed)
Requested Prescriptions  Pending Prescriptions Disp Refills   metFORMIN (GLUCOPHAGE-XR) 500 MG 24 hr tablet [Pharmacy Med Name: METFORMIN ER TAB $Remo'500MG'OmuvJ$  GP] 360 tablet 0    Sig: TAKE 4 TABLETS DAILY WITH  BREAKFAST     Endocrinology:  Diabetes - Biguanides Failed - 08/12/2021  8:15 AM      Failed - Cr in normal range and within 360 days    Creatinine, Ser  Date Value Ref Range Status  05/22/2021 1.10 (H) 0.57 - 1.00 mg/dL Final         Failed - eGFR in normal range and within 360 days    GFR calc Af Amer  Date Value Ref Range Status  05/23/2020 79 >59 mL/min/1.73 Final    Comment:    **[facility] currently reports eGFR in compliance with the current**   recommendations of the [employer]. [facility] will   update reporting as new guidelines are published from the NKF-ASN   Task force.    GFR, Estimated  Date Value Ref Range Status  04/17/2021 53 (L) >60 mL/min Final    Comment:    (NOTE) Calculated using the CKD-EPI Creatinine Equation (2021)    eGFR  Date Value Ref Range Status  05/22/2021 53 (L) >59 mL/min/1.73 Final         Passed - HBA1C is between 0 and 7.9 and within 180 days    HB A1C (BAYER DCA - WAIVED)  Date Value Ref Range Status  05/22/2021 6.3 (H) 4.8 - 5.6 % Final    Comment:             Prediabetes: 5.7 - 6.4          Diabetes: >6.4          Glycemic control for adults with diabetes: <7.0               **Please note reference interval change**          Passed - Valid encounter within last 6 months    Recent Outpatient Visits          2 months ago Routine general medical examination at a health care facility   [facility], [place] P, DO   4 months ago Viral upper respiratory tract infection   [facility] Jon Billings, NP   7 months ago 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

## 2021-08-14 NOTE — Telephone Encounter (Signed)
Copied from [place] [id number]. Topic: General - Inquiry >> Aug 09, 2021  9:05 AM Greggory Keen D wrote: Reason for CRM: Pt called saying provider needs to send in a list of all her medications to [facility].  [phone]. She is changing from [place] to [place]  She also needs a referral for her Eye doctor.  She goes to [facility].  Fax #  [phone].  She has an appt !/10/23  The pharmacy needs an order for a new glucose meter faxed to the same number [phone] >> Aug 09, 2021  9:50 AM Street, Ubaldo Glassing L wrote: Lmom asking pt to call back to schedule an appt for referral

## 2021-08-18 MED ORDER — ONETOUCH VERIO VI STRP: ORAL_STRIP | 12 refills | Status: DC

## 2021-08-18 MED ORDER — ONETOUCH ULTRASOFT LANCETS MISC: 12 refills | Status: DC

## 2021-08-18 MED ORDER — ONETOUCH VERIO W/DEVICE KIT: 1.0000 | PACK | Freq: Every day | 0 refills | Status: DC

## 2021-08-21 DIAGNOSIS — E119 Type 2 diabetes mellitus without complications: Secondary | ICD-10-CM | POA: Diagnosis not present

## 2021-08-21 LAB — HM DIABETES EYE EXAM

## 2021-08-23 ENCOUNTER — Telehealth: Payer: Self-pay | Admitting: Family Medicine

## 2021-08-23 NOTE — Telephone Encounter (Signed)
Please resend referral to specify provider per patient.

## 2021-08-23 NOTE — Telephone Encounter (Signed)
Copied from [place] [id number]. Topic: Referral - Status >> Aug 22, 2021  3:48 PM Yvette Rack wrote: Reason for CRM: Pt stated she needs the referral to be sent to Dr. Estill Cotta at [address]

## 2021-08-28 ENCOUNTER — Other Ambulatory Visit: Payer: Self-pay | Admitting: *Deleted

## 2021-08-28 DIAGNOSIS — D649 Anemia, unspecified: Secondary | ICD-10-CM

## 2021-09-03 ENCOUNTER — Inpatient Hospital Stay: Payer: No Typology Code available for payment source | Attending: Oncology

## 2021-09-03 ENCOUNTER — Inpatient Hospital Stay (HOSPITAL_BASED_OUTPATIENT_CLINIC_OR_DEPARTMENT_OTHER): Payer: No Typology Code available for payment source | Admitting: Oncology

## 2021-09-03 ENCOUNTER — Encounter: Payer: Self-pay | Admitting: Oncology

## 2021-09-03 ENCOUNTER — Other Ambulatory Visit: Payer: Self-pay

## 2021-09-03 VITALS — BP 153/54 | HR 68 | Temp 97.8°F | Resp 16 | Ht 61.0 in | Wt 168.7 lb

## 2021-09-03 DIAGNOSIS — D649 Anemia, unspecified: Secondary | ICD-10-CM

## 2021-09-03 DIAGNOSIS — Z87891 Personal history of nicotine dependence: Secondary | ICD-10-CM | POA: Diagnosis not present

## 2021-09-03 DIAGNOSIS — E039 Hypothyroidism, unspecified: Secondary | ICD-10-CM | POA: Insufficient documentation

## 2021-09-03 DIAGNOSIS — M8580 Other specified disorders of bone density and structure, unspecified site: Secondary | ICD-10-CM | POA: Diagnosis not present

## 2021-09-03 LAB — CBC WITH DIFFERENTIAL/PLATELET
Abs Immature Granulocytes: 0.02 10*3/uL (ref 0.00–0.07)
Basophils Absolute: 0 10*3/uL (ref 0.0–0.1)
Basophils Relative: 1 %
Eosinophils Absolute: 0.1 10*3/uL (ref 0.0–0.5)
Eosinophils Relative: 1 %
HCT: 32.8 % — ABNORMAL LOW (ref 36.0–46.0)
Hemoglobin: 10.7 g/dL — ABNORMAL LOW (ref 12.0–15.0)
Immature Granulocytes: 0 %
Lymphocytes Relative: 26 %
Lymphs Abs: 2.2 10*3/uL (ref 0.7–4.0)
MCH: 30.8 pg (ref 26.0–34.0)
MCHC: 32.6 g/dL (ref 30.0–36.0)
MCV: 94.5 fL (ref 80.0–100.0)
Monocytes Absolute: 0.5 10*3/uL (ref 0.1–1.0)
Monocytes Relative: 6 %
Neutro Abs: 5.4 10*3/uL (ref 1.7–7.7)
Neutrophils Relative %: 66 %
Platelets: 248 10*3/uL (ref 150–400)
RBC: 3.47 MIL/uL — ABNORMAL LOW (ref 3.87–5.11)
RDW: 12.8 % (ref 11.5–15.5)
WBC: 8.3 10*3/uL (ref 4.0–10.5)
nRBC: 0 % (ref 0.0–0.2)

## 2021-09-03 LAB — FERRITIN: Ferritin: 35 ng/mL (ref 11–307)

## 2021-09-03 LAB — IRON AND TIBC
Iron: 40 ug/dL (ref 28–170)
Saturation Ratios: 11 % (ref 10.4–31.8)
TIBC: 353 ug/dL (ref 250–450)
UIBC: 313 ug/dL

## 2021-09-03 NOTE — Progress Notes (Signed)
Has no concerns today except for her b/p is higher that what she thinks it should be

## 2021-09-03 NOTE — Progress Notes (Signed)
Hematology/Oncology Consult note [facility]  [phone] [phone]  Patient Care Team: [name], DO as PCP - General (Family Medicine) Kathrine Haddock, NP as PCP - Family Medicine (Nurse Practitioner) Kate Sable, MD as PCP - Cardiology (Cardiology) Sharlet Salina, MD as Referring Physician (Physical Medicine and Rehabilitation) Vanita Ingles, RN as Case Manager (General Practice) Sindy Guadeloupe, MD as Consulting Physician (Hematology and Oncology)   Name of the patient: Teresa [name]  [id number]  [birth year]   Date of visit: 09/03/21  Diagnosis-normocytic anemia likely secondary to chronic disease  Chief complaint/ Reason for visit-routine follow-up of anemia  Heme/Onc history: Patient is a 76 year old female with a past medical history significant for hypertension hyperlipidemia hypothyroidism who was seen by Dr. Bonna Gains for symptoms of constipation.  As a part of her work-up she had her CBC checked which showed a low hemoglobin of 10.7 and hence she has been referred to [place].  Looking back at her prior CBCs patient has had a normal white count and a platelet count but hemoglobin has been between 10-11 since 2017 and has remained stable around that range.  There has been no improvement nor decline in her hemoglobin.  Ferritin levels were normal at 77 on 03/12/2021 with normal iron studies.  TSH in April 2022 was normal.    Interval history-patient reports feeling at her baseline state of health.  Denies any specific complaints at this time.  She has mild chronic fatigue  ECOG PS- 1 Pain scale- 0   Review of systems- Review of Systems  Constitutional:  Positive for malaise/fatigue. Negative for chills, fever and weight loss.  HENT:  Negative for congestion, ear discharge and nosebleeds.   Eyes:  Negative for blurred vision.  Respiratory:  Negative for cough, hemoptysis, sputum production, shortness of breath and  wheezing.   Cardiovascular:  Negative for chest pain, palpitations, orthopnea and claudication.  Gastrointestinal:  Negative for abdominal pain, blood in stool, constipation, diarrhea, heartburn, melena, nausea and vomiting.  Genitourinary:  Negative for dysuria, flank pain, frequency, hematuria and urgency.  Musculoskeletal:  Negative for back pain, joint pain and myalgias.  Skin:  Negative for rash.  Neurological:  Negative for dizziness, tingling, focal weakness, seizures, weakness and headaches.  Endo/Heme/Allergies:  Does not bruise/bleed easily.  Psychiatric/Behavioral:  Negative for depression and suicidal ideas. The patient does not have insomnia.      Allergies  Allergen Reactions   Amlodipine Other (See Comments)    Ankle swelling   Atorvastatin     cramps   Evista [Raloxifene] Nausea Only     Past Medical History:  Diagnosis Date   Diabetes mellitus without complication (HCC)    Heart murmur    Hyperlipidemia    Hypertension    Hypothyroidism    Lichen sclerosus    Obesity    Osteopenia    Stroke [facility])    Thyroid disease      Past Surgical History:  Procedure Laterality Date   carpel tunnel     COLONOSCOPY WITH PROPOFOL N/A 08/30/2020   Procedure: COLONOSCOPY WITH PROPOFOL;  Surgeon: Virgel Manifold, MD;  Location: [facility] ENDOSCOPY;  Service: Gastroenterology;  Laterality: N/A;   ROTATOR CUFF REPAIR Left    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      Social History   Socioeconomic History   Marital status: Married    Spouse name: Not on file   Number of children: Not on file   Years of education:  Not on file   Highest education level: High school graduate  Occupational History   Occupation: retired  Tobacco Use   Smoking status: Former    Packs/day: 2.00    Types: Cigarettes    Quit date: 01/1965    Years since quitting: 44.6   Smokeless tobacco: Never  Vaping Use   Vaping Use: Never used  Substance and Sexual Activity   Alcohol use: No     Alcohol/week: 0.0 standard drinks   Drug use: No   Sexual activity: Never  Other Topics Concern   Not on file  Social History Narrative   Not on file   Social Determinants of Health   Financial Resource Strain: Low Risk    Difficulty of Paying Living Expenses: Not hard at all  Food Insecurity: No Food Insecurity   Worried About Charity fundraiser in the Last Year: Never true   [place] in the Last Year: Never true  Transportation Needs: No Transportation Needs   Lack of Transportation (Medical): No   Lack of Transportation (Non-Medical): No  Physical Activity: Inactive   Days of Exercise per Week: 0 days   Minutes of Exercise per Session: 0 min  Stress: No Stress Concern Present   Feeling of Stress : Not at all  Social Connections: Socially Integrated   Frequency of Communication with Friends and Family: More than three times a week   Frequency of Social Gatherings with Friends and Family: More than three times a week   Attends Religious Services: More than 4 times per year   Active Member of [employer] or Organizations: Yes   Attends Music therapist: More than 4 times per year   Marital Status: Married  Human resources officer Violence: Not At Risk   Fear of Current or Ex-Partner: No   Emotionally Abused: No   Physically Abused: No   Sexually Abused: No    Family History  Problem Relation Age of Onset   Hypertension Mother    Thyroid disease Mother    Glaucoma Mother    Coronary artery disease Mother    Stroke Father    Diabetes Brother    Hypertension Maternal Grandfather    Breast cancer Neg Hx      Current Outpatient Medications:    ASPIRIN 81 PO, Take by mouth daily., Disp: , Rfl:    blood glucose meter kit and supplies, Dispense based on patient and insurance preference. Use up to four times daily as directed. (FOR ICD-9 250.00, 250.01)., Disp: 1 each, Rfl: 0   Blood Glucose Monitoring Suppl (ONETOUCH VERIO) w/Device KIT, 1 each by Does not apply  route daily., Disp: 1 kit, Rfl: 0   clobetasol ointment (TEMOVATE) 0.05 %, Apply topically 2 (two) times daily., Disp: 30 g, Rfl: 3   clopidogrel (PLAVIX) 75 MG tablet, Take 1 tablet (75 mg total) by mouth daily., Disp: 90 tablet, Rfl: 3   folic acid (FOLVITE) 616 MCG tablet, Take 400 mcg by mouth daily., Disp: , Rfl:    glucose blood (ONETOUCH VERIO) test strip, Use as instructed, Disp: 100 each, Rfl: 12   glucose blood test strip, 1 each by Other route 2 (two) times daily. Use as instructed, Dx: E11.22, Disp: 100 each, Rfl: 12   Lancets (ONETOUCH ULTRASOFT) lancets, Use as instructed, Disp: 100 each, Rfl: 12   Lancets MISC, 1 each by Does not apply route daily., Disp: 100 each, Rfl: 12   levothyroxine (SYNTHROID) 50 MCG tablet, TAKE 1 TABLET  DAILY, Disp: 90 tablet, Rfl: 3   lisinopril (ZESTRIL) 40 MG tablet, TAKE 1 TABLET DAILY, Disp: 90 tablet, Rfl: 1   metFORMIN (GLUCOPHAGE-XR) 500 MG 24 hr tablet, TAKE 4 TABLETS DAILY WITH  BREAKFAST, Disp: 360 tablet, Rfl: 0   rosuvastatin (CRESTOR) 10 MG tablet, TAKE 1 TABLET DAILY, Disp: 90 tablet, Rfl: 1   vitamin B-12 (CYANOCOBALAMIN) 1000 MCG tablet, Take 1,000 mcg by mouth daily., Disp: , Rfl:    chlorthalidone (HYGROTON) 25 MG tablet, Take 1 tablet (25 mg total) by mouth daily. (Patient not taking: Reported on 09/03/2021), Disp: 90 tablet, Rfl: 1  Physical exam:  Vitals:   09/03/21 1404  BP: (!) 153/54  Pulse: 68  Resp: 16  Temp: 97.8 F (36.6 C)  TempSrc: Oral  Weight: 168 lb 11.2 oz (76.5 kg)  Height: $Remove'5\' 1"'AXBNsGn$  (1.549 m)   Physical Exam Constitutional:      General: She is not in acute distress. Cardiovascular:     Rate and Rhythm: Normal rate and regular rhythm.     Heart sounds: Normal heart sounds.  Pulmonary:     Effort: Pulmonary effort is normal.     Breath sounds: Normal breath sounds.  Abdominal:     General: Bowel sounds are normal. There is no distension.     Palpations: Abdomen is soft.     Tenderness: There is no abdominal  tenderness.  Skin:    General: Skin is warm and dry.  Neurological:     Mental Status: She is alert and oriented to person, place, and time.     CMP Latest Ref Rng & Units 05/22/2021  Glucose 70 - 99 mg/dL 121(H)  BUN 8 - 27 mg/dL 31(H)  Creatinine 0.57 - 1.00 mg/dL 1.10(H)  Sodium 134 - 144 mmol/L 145(H)  Potassium 3.5 - 5.2 mmol/L 4.6  Chloride 96 - 106 mmol/L 108(H)  CO2 20 - 29 mmol/L 20  Calcium 8.7 - 10.3 mg/dL 9.8  Total Protein 6.0 - 8.5 g/dL 6.9  Total Bilirubin 0.0 - 1.2 mg/dL <0.2  Alkaline Phos 44 - 121 IU/L 71  AST 0 - 40 IU/L 16  ALT 0 - 32 IU/L 15   CBC Latest Ref Rng & Units 09/03/2021  WBC 4.0 - 10.5 K/uL 8.3  Hemoglobin 12.0 - 15.0 g/dL 10.7(L)  Hematocrit 36.0 - 46.0 % 32.8(L)  Platelets 150 - 400 K/uL 248    Assessment and plan- Patient is a 76 y.o. female with chronic normocytic anemia here for routine follow-up  Patient's hemoglobin has been around 10.5 for the last 4 years without much of fluctuation.  Iron studies and B12 levels have been normal.  She has been on oral B12 supplementation.  White count and platelet counts are normal.  Given the stability of her hemoglobin she does not require a bone marrow biopsy at this time.  I will see her back in 6 months with CBC ferritin and iron studies and B12 levels.  If there is no significant fluctuation in her hemoglobin she can continue to follow-up with Dr. Wynetta Emery at that time   Visit Diagnosis 1. Normocytic anemia      Dr. Randa Evens, MD, MPH [facility] at [facility] [phone] 09/03/2021 3:45 PM

## 2021-09-18 ENCOUNTER — Telehealth: Payer: Self-pay | Admitting: Family Medicine

## 2021-09-18 MED ORDER — ONETOUCH VERIO W/DEVICE KIT: 1.0000 | PACK | Freq: Every day | 0 refills | Status: DC

## 2021-09-18 MED ORDER — ONETOUCH VERIO VI STRP: ORAL_STRIP | 12 refills | Status: DC

## 2021-09-18 MED ORDER — ONETOUCH ULTRASOFT LANCETS MISC: 12 refills | Status: DC

## 2021-09-18 NOTE — Telephone Encounter (Signed)
Pt stated she needs refill for Lancets (ONETOUCH ULTRASOFT) lancets /    Blood Glucose Monitoring Suppl (ONETOUCH VERIO) w/Device KIT    and Lancets (ONETOUCH ULTRASOFT) lancets  / pt stated the [facility]  [facility] [place] [facility] Phone:  [phone]  Fax:  [phone]    Instead of [facility] / please resend and advise

## 2021-09-18 NOTE — Telephone Encounter (Signed)
Requested Prescriptions  Pending Prescriptions Disp Refills   Blood Glucose Monitoring Suppl (ONETOUCH VERIO) w/Device KIT 1 kit 0    Sig: 1 each by Does not apply route daily.     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 ([place])   [place], Megan P, DO   1 year ago 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 ([place])   [place], [place] P, DO      Future Appointments            In 2 months Wynetta Emery, Barb Merino, DO [place], [place]   In 5 months Sindy Guadeloupe, MD [facility] at [place]   In 6 months  [place], [facility]            glucose blood (ONETOUCH VERIO) test strip 100 each 12    Sig: Use as instructed     Endocrinology: Diabetes - Testing Supplies Passed - 09/18/2021 12:36 PM      Passed - Valid encounter within last 12 months    Recent Outpatient Visits          3 months ago Routine general medical examination at a health care facility   [facility], [place] P, DO   5 months ago Viral upper respiratory tract infection   [facility] Jon Billings, NP   9 months ago 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 ([place])   [place], Megan P, DO   9 months ago 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 ([place])    [place], Megan P, DO   1 year ago 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 ([place])   [place], Megan P, DO      Future Appointments            In 2 months Wynetta Emery, Barb Merino, DO [place], [place]   In 5 months Sindy Guadeloupe, MD [facility] at [place]   In 6 months  [place], [facility]            Lancets (ONETOUCH ULTRASOFT) lancets 100 each 12    Sig: Use as instructed     Endocrinology: Diabetes - Testing Supplies Passed - 09/18/2021 12:36 PM      Passed - Valid encounter within last 12 months    Recent Outpatient Visits          3 months ago Routine general medical examination at a health care facility   [facility], [place] P, DO   5 months ago Viral upper respiratory tract infection   [facility] Jon Billings, NP   9 months ago 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

## 2021-09-24 ENCOUNTER — Other Ambulatory Visit: Payer: Self-pay

## 2021-09-24 MED ORDER — ONETOUCH ULTRASOFT LANCETS MISC: 12 refills | Status: DC

## 2021-09-24 NOTE — Addendum Note (Signed)
Addended by: Linus Orn A on: 09/24/2021 03:40 PM   Modules accepted: Orders

## 2021-09-24 NOTE — Addendum Note (Signed)
Addended by: Linus Orn A on: 09/24/2021 03:09 PM   Modules accepted: Orders

## 2021-09-24 NOTE — Telephone Encounter (Addendum)
Lancets (ONETOUCH ULTRASOFT) lancets Medication Date: 09/18/2021 Department: [facility] Ordering/Authorizing: [name], DO  [facility] to verify that these are the OneToch [place] Plus 33 gage and to verify that the pt meter was no longer manufactured and is the reason for multiple calls. Meter has been obtained.  Lancets (ONETOUCH ULTRASOFT) lancets  / pt stated the refill [facility]  [facility] [place] [place] [facility] [place] [facility] Phone:  [phone]  Fax:  [phone]

## 2021-09-24 NOTE — Telephone Encounter (Signed)
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

## 2021-09-25 MED ORDER — ONETOUCH DELICA LANCETS 33G MISC: 1.0000 | Freq: Every day | 3 refills | Status: DC

## 2021-09-25 NOTE — Addendum Note (Signed)
Addended by: Irena Reichmann on: 09/25/2021 02:53 PM   Modules accepted: Orders

## 2021-09-25 NOTE — Addendum Note (Signed)
Addended by: Irena Reichmann on: 09/25/2021 02:55 PM   Modules accepted: Orders

## 2021-09-25 NOTE — Addendum Note (Signed)
Addended by: [name] on: 09/25/2021 03:00 PM   Modules accepted: Orders

## 2021-09-25 NOTE — Telephone Encounter (Signed)
Pharmacy called and wanted to know if it is ok to change RX from Lancets (ONETOUCH ULTRASO/FT) lancets to  [place] 33 gage lancets  contact [phone]  / stated pt needs the ones above not the one sent   Ref [id number]

## 2021-10-02 ENCOUNTER — Encounter: Payer: Self-pay | Admitting: Family Medicine

## 2021-10-02 ENCOUNTER — Ambulatory Visit (INDEPENDENT_AMBULATORY_CARE_PROVIDER_SITE_OTHER): Payer: No Typology Code available for payment source | Admitting: Family Medicine

## 2021-10-02 ENCOUNTER — Other Ambulatory Visit: Payer: Self-pay

## 2021-10-02 VITALS — BP 132/79 | HR 72 | Temp 99.0°F

## 2021-10-02 DIAGNOSIS — J069 Acute upper respiratory infection, unspecified: Secondary | ICD-10-CM

## 2021-10-02 MED ORDER — HYDROCOD POLI-CHLORPHE POLI ER 10-8 MG/5ML PO SUER: 5.0000 mL | Freq: Two times a day (BID) | ORAL | 0 refills | Status: DC | PRN

## 2021-10-02 MED ORDER — BENZONATATE 200 MG PO CAPS: 200.0000 mg | ORAL_CAPSULE | Freq: Two times a day (BID) | ORAL | 0 refills | Status: DC | PRN

## 2021-10-02 NOTE — Progress Notes (Signed)
BP 132/79    Pulse 72    Temp 99 F (37.2 C)    LMP  (LMP Unknown)    SpO2 97%    Subjective:    Patient ID: Teresa [name], female    DOB: [birth year], 76 y.o.   MRN: [id number]  HPI: Teresa [name] is a 76 y.o. female  Chief Complaint  Patient presents with   Cough    Patient states she has been coughing since Saturday afternoon. Patient is wheezing. Patient states she has runny nose. Patient grand daughter tested positive for COVID yesterday and they were recently together.    UPPER RESPIRATORY TRACT INFECTION Duration: 4 days Worst symptom: cough Fever: no Cough: yes Shortness of breath: no Wheezing: yes Chest pain: no Chest tightness: no Chest congestion: yes Nasal congestion: yes Runny nose: yes Post nasal drip: yes Sneezing: no Sore throat: yes Swollen glands: no Sinus pressure: no Headache: no Face pain: no Toothache: no Ear pain: no  Ear pressure: no  Eyes red/itching:no Eye drainage/crusting: no  Vomiting: no Rash: no Fatigue: yes Sick contacts: yes- grand-daughter tested positive for COVID yesterday Strep contacts: no  Context: stable Recurrent sinusitis: no Relief with OTC cold/cough medications: no  Treatments attempted: none   Relevant past medical, surgical, family and social history reviewed and updated as indicated. Interim medical history since our last visit reviewed. Allergies and medications reviewed and updated.  Review of Systems  Constitutional:  Positive for fatigue. Negative for activity change, appetite change, chills, diaphoresis, fever and unexpected weight change.  HENT:  Positive for congestion, postnasal drip and rhinorrhea. Negative for dental problem, drooling, ear discharge, ear pain, facial swelling, hearing loss, mouth sores, nosebleeds, sinus pressure, sinus pain, sneezing, sore throat, tinnitus, trouble swallowing and voice change.   Eyes: Negative.   Respiratory:  Positive for cough and wheezing. Negative for  apnea, choking, chest tightness, shortness of breath and stridor.   Cardiovascular: Negative.   Gastrointestinal: Negative.   Psychiatric/Behavioral: Negative.     Per HPI unless specifically indicated above     Objective:    BP 132/79    Pulse 72    Temp 99 F (37.2 C)    LMP  (LMP Unknown)    SpO2 97%   Wt Readings from Last 3 Encounters:  09/03/21 168 lb 11.2 oz (76.5 kg)  05/22/21 167 lb (75.8 kg)  04/25/21 166 lb 12.8 oz (75.7 kg)    Physical Exam Vitals and nursing note reviewed.  Constitutional:      General: She is not in acute distress.    Appearance: Normal appearance. She is normal weight. She is not ill-appearing, toxic-appearing or diaphoretic.  HENT:     Head: Normocephalic and atraumatic.     Right Ear: Tympanic membrane, ear canal and external ear normal.     Left Ear: Tympanic membrane, ear canal and external ear normal.     Nose: Rhinorrhea present. No congestion.     Mouth/Throat:     Mouth: Mucous membranes are moist.     Pharynx: Oropharynx is clear. No oropharyngeal exudate or posterior oropharyngeal erythema.  Eyes:     General: No scleral icterus.       Right eye: No discharge.        Left eye: No discharge.     Extraocular Movements: Extraocular movements intact.     Conjunctiva/sclera: Conjunctivae normal.     Pupils: Pupils are equal, round, and reactive to light.  Cardiovascular:  Rate and Rhythm: Normal rate and regular rhythm.     Pulses: Normal pulses.     Heart sounds: Normal heart sounds. No murmur heard.   No friction rub. No gallop.  Pulmonary:     Effort: Pulmonary effort is normal. No respiratory distress.     Breath sounds: Normal breath sounds. No stridor. No wheezing, rhonchi or rales.  Chest:     Chest wall: No tenderness.  Musculoskeletal:        General: Normal range of motion.     Cervical back: Normal range of motion and neck supple.  Skin:    General: Skin is warm and dry.     Capillary Refill: Capillary refill takes  less than 2 seconds.     Coloration: Skin is not jaundiced or pale.     Findings: No bruising, erythema, lesion or rash.  Neurological:     General: No focal deficit present.     Mental Status: She is alert and oriented to person, place, and time. Mental status is at baseline.  Psychiatric:        Mood and Affect: Mood normal.        Behavior: Behavior normal.        Thought Content: Thought content normal.        Judgment: Judgment normal.    Results for orders placed or performed in visit on 09/12/21  HM DIABETES EYE EXAM  Result Value Ref Range   HM Diabetic Eye Exam No Retinopathy No Retinopathy      Assessment & Plan:   Problem List Items Addressed This Visit   None Visit Diagnoses     Upper respiratory tract infection, unspecified type    -  Primary   Flu and strep negative. Concern for COVID given exposure. Will await results and treat with tussionex and tessalon. Call if not getting better or getting worse.   Relevant Orders   Veritor Flu A/B Waived   Rapid Strep Screen ([facility] ONLY)   Novel Coronavirus, NAA ([facility])        Follow up plan: Return if symptoms worsen or fail to improve.

## 2021-10-03 ENCOUNTER — Telehealth: Payer: Medicare HMO

## 2021-10-03 ENCOUNTER — Ambulatory Visit (INDEPENDENT_AMBULATORY_CARE_PROVIDER_SITE_OTHER): Payer: No Typology Code available for payment source

## 2021-10-03 DIAGNOSIS — I10 Essential (primary) hypertension: Secondary | ICD-10-CM

## 2021-10-03 DIAGNOSIS — J069 Acute upper respiratory infection, unspecified: Secondary | ICD-10-CM

## 2021-10-03 DIAGNOSIS — N1831 Type 2 diabetes mellitus with diabetic chronic kidney disease: Secondary | ICD-10-CM

## 2021-10-03 DIAGNOSIS — E1122 Type 2 diabetes mellitus with diabetic chronic kidney disease: Secondary | ICD-10-CM

## 2021-10-03 DIAGNOSIS — E782 Mixed hyperlipidemia: Secondary | ICD-10-CM

## 2021-10-03 LAB — NOVEL CORONAVIRUS, NAA: SARS-CoV-2, NAA: NOT DETECTED

## 2021-10-03 NOTE — Chronic Care Management (AMB) (Signed)
Chronic Care Management   CCM RN Visit Note  10/03/2021 Name: Teresa [name] MRN: [id number] DOB: Dec 23, 1945  Subjective: Teresa [name] is a 76 y.o. year old female who is a primary care patient of Olayinka, Gathers, DO. The care management team was consulted for assistance with disease management and care coordination needs.    Engaged with patient by telephone for follow up visit in response to provider referral for case management and/or care coordination services.   Consent to Services:  The patient was given information about Chronic Care Management services, agreed to services, and gave verbal consent prior to initiation of services.  Please see initial visit note for detailed documentation.   Patient agreed to services and verbal consent obtained.   Assessment: Review of patient past medical history, allergies, medications, health status, including review of consultants reports, laboratory and other test data, was performed as part of comprehensive evaluation and provision of chronic care management services.   SDOH (Social Determinants of Health) assessments and interventions performed:    CCM Care Plan  Allergies  Allergen Reactions   Amlodipine Other (See Comments)    Ankle swelling   Atorvastatin     cramps   Evista [Raloxifene] Nausea Only    Outpatient Encounter Medications as of 10/03/2021  Medication Sig   ASPIRIN 81 PO Take by mouth daily.   benzonatate (TESSALON) 200 MG capsule Take 1 capsule (200 mg total) by mouth 2 (two) times daily as needed for cough.   blood glucose meter kit and supplies Dispense based on patient and insurance preference. Use up to four times daily as directed. (FOR ICD-9 250.00, 250.01).   Blood Glucose Monitoring Suppl (ONETOUCH VERIO) w/Device KIT 1 each by Does not apply route daily.   chlorpheniramine-HYDROcodone (TUSSIONEX PENNKINETIC ER) 10-8 MG/5ML Take 5 mLs by mouth every 12 (twelve) hours as needed.   chlorthalidone  (HYGROTON) 25 MG tablet Take 1 tablet (25 mg total) by mouth daily. (Patient not taking: Reported on 09/03/2021)   clobetasol ointment (TEMOVATE) 0.05 % Apply topically 2 (two) times daily.   clopidogrel (PLAVIX) 75 MG tablet Take 1 tablet (75 mg total) by mouth daily.   folic acid (FOLVITE) 062 MCG tablet Take 400 mcg by mouth daily.   glucose blood (ONETOUCH VERIO) test strip Use as instructed   glucose blood test strip 1 each by Other route 2 (two) times daily. Use as instructed, Dx: E11.22   levothyroxine (SYNTHROID) 50 MCG tablet TAKE 1 TABLET DAILY   lisinopril (ZESTRIL) 40 MG tablet TAKE 1 TABLET DAILY   metFORMIN (GLUCOPHAGE-XR) 500 MG 24 hr tablet TAKE 4 TABLETS DAILY WITH  BREAKFAST   OneTouch Delica Lancets 69S MISC 1 each by Does not apply route daily.   rosuvastatin (CRESTOR) 10 MG tablet TAKE 1 TABLET DAILY   vitamin B-12 (CYANOCOBALAMIN) 1000 MCG tablet Take 1,000 mcg by mouth daily.   No facility-administered encounter medications on file as of 10/03/2021.    Patient Active Problem List   Diagnosis Date Noted   Pulmonary HTN ([place]) 05/22/2021   Senile purpura ([place]) 05/22/2021   History of colonic polyps 05/18/2020   Bradycardia 02/11/2018   Lumbar radiculopathy 09/27/2017   Advanced care planning/counseling discussion 01/22/2017   Type 2 diabetes mellitus with stage 3 chronic kidney disease, without long-term current use of insulin ([place]) 07/04/2015   CVA (cerebral infarction) 04/03/2015   Obesity 85/46/2703   Lichen sclerosus 50/04/3817   Hypothyroidism 04/03/2015   Osteopenia 04/03/2015   Hyperlipidemia 04/03/2015  B12 deficiency 04/03/2015   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   CKD (chronic kidney disease), stage III ([place]) 04/03/2015   Anemia 03/10/2014    Conditions to be addressed/monitored:HTN, HLD, DMII, and URI- current    Care Plan : RNCM: General Plan of Care (Adult) for Chronic Disease Management and Care Coordination Needs  Updates  made by Vanita Ingles, RN since 10/03/2021 12:00 AM     Problem: RNCM: Development of Plan of Care for Chronic Disease Management (DM, HTN, HLD, and URI)   Priority: High     Long-Range Goal: RNCM: Effective management of plan of care for Chronic Disease Management (HTN, HLD, DM, URI)   Start Date: 10/03/2021  Expected End Date: 10/03/2022  Priority: High  Note:   Current Barriers:  Knowledge Deficits related to plan of care for management of HTN, HLD, DMII, and Current URI  Chronic Disease Management support and education needs related to HTN, HLD, DMII, and Current URI  RNCM Clinical Goal(s):  Patient will verbalize basic understanding of HTN, HLD, DMII, and Current URI disease process and self health management plan as evidenced by keeping appointments, calling the office for changes in conditions, questions, or concerns, and working with the CCM team to optimize health and well being demonstrate understanding of rationale for each prescribed medication as evidenced by compliance with medications and calling for medications refills before running out of medications    attend all scheduled medical appointments: 11-20-2021 at 0820 am  as evidenced by keeping appointments and calling for schedule change needs        demonstrate improved and ongoing health management independence as evidenced by stable conditions, lab work on a regular basis, routine office visits, calling the office for changes or questions.         demonstrate ongoing self health care management ability for effective management of chronic conditions as evidenced by working with the CCM team through collaboration with Consulting civil engineer, provider, and care team.   Interventions: 1:1 collaboration with primary care provider regarding development and update of comprehensive plan of care as evidenced by provider attestation and co-signature Inter-disciplinary care team collaboration (see longitudinal plan of care) Evaluation of  current treatment plan related to  self management and patient's adherence to plan as established by provider   Diabetes:  (Status: Goal on Track (progressing): YES.) Long Term Goal   Lab Results  Component Value Date   HGBA1C 6.3 (H) 05/22/2021  Assessed patient's understanding of A1c goal: <7% Provided education to patient about basic DM disease process; Reviewed medications with patient and discussed importance of medication adherence;        Reviewed prescribed diet with patient heart healthy/ADA diet ; Counseled on importance of regular laboratory monitoring as prescribed;        Discussed plans with patient for ongoing care management follow up and provided patient with direct contact information for care management team;      Provided patient with written educational materials related to hypo and hyperglycemia and importance of correct treatment;       Reviewed scheduled/upcoming provider appointments including: 11-20-2021;         Advised patient, providing education and rationale, to check cbg before meals and at bedtime, when you have symptoms of low or high blood sugar, and before and after exercise and record        call provider for findings outside established parameters;       Review of patient status, including review of  consultants reports, relevant laboratory and other test results, and medications completed;       Screening for signs and symptoms of depression related to chronic disease state;        Assessed social determinant of health barriers;         Current URI 10-03-2021  (Status: Goal on Track (progressing): YES.) Long Term Goal  Evaluation of current treatment plan related to  Current URI ,  self-management and patient's adherence to plan as established by provider. Discussed plans with patient for ongoing care management follow up and provided patient with direct contact information for care management team Advised patient to call the office for worsening sx and sx  of respiratory infection getting worse. The patient is waiting to see if she is COVID + due to being exposed by her granddaughter. The patient states the last 2 days she had not been drinking like she should but today that she is drinking plenty of liquids. The patient states that she is feeling better. States the medications helped her get some good sleep and she feels rested. ; Provided education to patient re: worsening sx and sx of infections, to seek emergent help for increased shortness or breath, monitor for chills, fever or other symptoms warranting provider recommendations. Ask the patient to call the office for questions and concerns ; Reviewed medications with patient and discussed compliance with medications; Reviewed scheduled/upcoming provider appointments including 11-20-2021 at [place] am; Discussed plans with patient for ongoing care management follow up and provided patient with direct contact information for care management team; Advised patient to discuss new concerns about URI, or questions with provider;  Hyperlipidemia:  (Status: Goal on Track (progressing): YES.) Long Term Goal  Lab Results  Component Value Date   CHOL 126 05/22/2021   HDL 49 05/22/2021   [place] 60 05/22/2021   TRIG 86 05/22/2021     Medication review performed; medication list updated in electronic medical record.  Provider established cholesterol goals reviewed; Counseled on importance of regular laboratory monitoring as prescribed; Provided HLD educational materials; Reviewed role and benefits of statin for ASCVD risk reduction; Discussed strategies to manage statin-induced myalgias; Reviewed importance of limiting foods high in cholesterol; Reviewed exercise goals and target of 150 minutes per week;  Hypertension: (Status: Goal on Track (progressing): YES.) Long Term Goal  Last practice recorded BP readings:  BP Readings from Last 3 Encounters:  10/02/21 132/79  09/03/21 (!) 153/54  05/22/21  129/72  Most recent eGFR/CrCl:  Lab Results  Component Value Date   EGFR 53 (L) 05/22/2021    No components found for: CRCL  Evaluation of current treatment plan related to hypertension self management and patient's adherence to plan as established by provider;   Provided education to patient re: stroke prevention, s/s of heart attack and stroke; Reviewed prescribed diet heart healthy/ADA diet  Reviewed medications with patient and discussed importance of compliance;  Counseled on the importance of exercise goals with target of 150 minutes per week Discussed plans with patient for ongoing care management follow up and provided patient with direct contact information for care management team; Advised patient, providing education and rationale, to monitor blood pressure daily and record, calling PCP for findings outside established parameters;  Advised patient to discuss blood pressure changes  with provider; Provided education on prescribed diet heart healthy/ADA diet ;  Discussed complications of poorly controlled blood pressure such as heart disease, stroke, circulatory complications, vision complications, kidney impairment, sexual dysfunction;   Patient Goals/Self-Care Activities:  Take medications as prescribed   Attend all scheduled provider appointments Call pharmacy for medication refills 3-7 days in advance of running out of medications Attend church or other social activities Perform all self care activities independently  Perform IADL's (shopping, preparing meals, housekeeping, managing finances) independently Call provider office for new concerns or questions  Work with the social worker to address care coordination needs and will continue to work with the clinical team to address health care and disease management related needs call the Suicide and Crisis Lifeline: 988 call the [place] National Suicide Prevention Lifeline: 256-615-2820 or TTY: 405-070-1807 TTY 5160471045) to  talk to a trained counselor call 1-800-273-TALK (toll free, 24 hour hotline) if experiencing a Mental Health or Goessel  keep appointment with eye doctor- Had vision check in January of 2023 check blood sugar at prescribed times: before meals and at bedtime, when you have symptoms of low or high blood sugar, and before and after exercise check feet daily for cuts, sores or redness enter blood sugar readings and medication or insulin into daily log take the blood sugar log to all doctor visits trim toenails straight across drink 6 to 8 glasses of water each day eat fish at least once per week fill half of plate with vegetables join a weight loss program limit fast food meals to no more than 1 per week manage portion size prepare main meal at home 3 to 5 days each week read food labels for fat, fiber, carbohydrates and portion size reduce red meat to 2 to 3 times a week keep feet up while sitting wash and dry feet carefully every day wear comfortable, cotton socks wear comfortable, well-fitting shoes check blood pressure weekly choose a place to take my blood pressure (home, clinic or office, retail store) write blood pressure results in a log or diary learn about high blood pressure keep a blood pressure log take blood pressure log to all doctor appointments call doctor for signs and symptoms of high blood pressure develop an action plan for high blood pressure keep all doctor appointments take medications for blood pressure exactly as prescribed report new symptoms to your doctor eat more whole grains, fruits and vegetables, lean meats and healthy fats - call for medicine refill 2 or 3 days before it runs out - take all medications exactly as prescribed - call doctor with any symptoms you believe are related to your medicine - call doctor when you experience any new symptoms - go to all doctor appointments as scheduled - adhere to prescribed diet: heart  healthy/ADA       Plan:Telephone follow up appointment with care management team member scheduled for:  11-28-2021 at 145 pm  [place], MSN, [facility] Mobile: [phone]

## 2021-10-03 NOTE — Patient Instructions (Signed)
Visit Information  Thank you for taking time to visit with me today. Please don't hesitate to contact me if I can be of assistance to you before our next scheduled telephone appointment.  Following are the goals we discussed today:  RNCM Clinical Goal(s):  Patient will verbalize basic understanding of HTN, HLD, DMII, and Current URI disease process and self health management plan as evidenced by keeping appointments, calling the office for changes in conditions, questions, or concerns, and working with the CCM team to optimize health and well being demonstrate understanding of rationale for each prescribed medication as evidenced by compliance with medications and calling for medications refills before running out of medications    attend all scheduled medical appointments: 11-20-2021 at 0820 am  as evidenced by keeping appointments and calling for schedule change needs        demonstrate improved and ongoing health management independence as evidenced by stable conditions, lab work on a regular basis, routine office visits, calling the office for changes or questions.         demonstrate ongoing self health care management ability for effective management of chronic conditions as evidenced by working with the CCM team through collaboration with Consulting civil engineer, provider, and care team.    Interventions: 1:1 collaboration with primary care provider regarding development and update of comprehensive plan of care as evidenced by provider attestation and co-signature Inter-disciplinary care team collaboration (see longitudinal plan of care) Evaluation of current treatment plan related to  self management and patient's adherence to plan as established by provider     Diabetes:  (Status: Goal on Track (progressing): YES.) Long Term Goal         Lab Results  Component Value Date    HGBA1C 6.3 (H) 05/22/2021  Assessed patient's understanding of A1c goal: <7% Provided education to patient about basic  DM disease process; Reviewed medications with patient and discussed importance of medication adherence;        Reviewed prescribed diet with patient heart healthy/ADA diet ; Counseled on importance of regular laboratory monitoring as prescribed;        Discussed plans with patient for ongoing care management follow up and provided patient with direct contact information for care management team;      Provided patient with written educational materials related to hypo and hyperglycemia and importance of correct treatment;       Reviewed scheduled/upcoming provider appointments including: 11-20-2021;         Advised patient, providing education and rationale, to check cbg before meals and at bedtime, when you have symptoms of low or high blood sugar, and before and after exercise and record        call provider for findings outside established parameters;       Review of patient status, including review of consultants reports, relevant laboratory and other test results, and medications completed;       Screening for signs and symptoms of depression related to chronic disease state;        Assessed social determinant of health barriers;          Current URI 10-03-2021  (Status: Goal on Track (progressing): YES.) Long Term Goal  Evaluation of current treatment plan related to  Current URI ,  self-management and patient's adherence to plan as established by provider. Discussed plans with patient for ongoing care management follow up and provided patient with direct contact information for care management team Advised patient to call the office for worsening  sx and sx of respiratory infection getting worse. The patient is waiting to see if she is COVID + due to being exposed by her granddaughter. The patient states the last 2 days she had not been drinking like she should but today that she is drinking plenty of liquids. The patient states that she is feeling better. States the medications helped her get  some good sleep and she feels rested. ; Provided education to patient re: worsening sx and sx of infections, to seek emergent help for increased shortness or breath, monitor for chills, fever or other symptoms warranting provider recommendations. Ask the patient to call the office for questions and concerns ; Reviewed medications with patient and discussed compliance with medications; Reviewed scheduled/upcoming provider appointments including 11-20-2021 at [place] am; Discussed plans with patient for ongoing care management follow up and provided patient with direct contact information for care management team; Advised patient to discuss new concerns about URI, or questions with provider;   Hyperlipidemia:  (Status: Goal on Track (progressing): YES.) Long Term Goal       Lab Results  Component Value Date    CHOL 126 05/22/2021    HDL 49 05/22/2021    [place] 60 05/22/2021    TRIG 86 05/22/2021      Medication review performed; medication list updated in electronic medical record.  Provider established cholesterol goals reviewed; Counseled on importance of regular laboratory monitoring as prescribed; Provided HLD educational materials; Reviewed role and benefits of statin for ASCVD risk reduction; Discussed strategies to manage statin-induced myalgias; Reviewed importance of limiting foods high in cholesterol; Reviewed exercise goals and target of 150 minutes per week;   Hypertension: (Status: Goal on Track (progressing): YES.) Long Term Goal  Last practice recorded BP readings:     BP Readings from Last 3 Encounters:  10/02/21 132/79  09/03/21 (!) 153/54  05/22/21 129/72  Most recent eGFR/CrCl:       Lab Results  Component Value Date    EGFR 53 (L) 05/22/2021    No components found for: CRCL   Evaluation of current treatment plan related to hypertension self management and patient's adherence to plan as established by provider;   Provided education to patient re: stroke  prevention, s/s of heart attack and stroke; Reviewed prescribed diet heart healthy/ADA diet  Reviewed medications with patient and discussed importance of compliance;  Counseled on the importance of exercise goals with target of 150 minutes per week Discussed plans with patient for ongoing care management follow up and provided patient with direct contact information for care management team; Advised patient, providing education and rationale, to monitor blood pressure daily and record, calling PCP for findings outside established parameters;  Advised patient to discuss blood pressure changes  with provider; Provided education on prescribed diet heart healthy/ADA diet ;  Discussed complications of poorly controlled blood pressure such as heart disease, stroke, circulatory complications, vision complications, kidney impairment, sexual dysfunction;    Patient Goals/Self-Care Activities: Take medications as prescribed   Attend all scheduled provider appointments Call pharmacy for medication refills 3-7 days in advance of running out of medications Attend church or other social activities Perform all self care activities independently  Perform IADL's (shopping, preparing meals, housekeeping, managing finances) independently Call provider office for new concerns or questions  Work with the social worker to address care coordination needs and will continue to work with the clinical team to address health care and disease management related needs call the Suicide and Crisis Lifeline: 988 call  the [place] National Suicide Prevention Lifeline: (386)333-4625 or TTY: (540)527-9065 TTY 579-218-7308) to talk to a trained counselor call 1-800-273-TALK (toll free, 24 hour hotline) if experiencing a Mental Health or [place]  keep appointment with eye doctor- Had vision check in January of 2023 check blood sugar at prescribed times: before meals and at bedtime, when you have symptoms of low or  high blood sugar, and before and after exercise check feet daily for cuts, sores or redness enter blood sugar readings and medication or insulin into daily log take the blood sugar log to all doctor visits trim toenails straight across drink 6 to 8 glasses of water each day eat fish at least once per week fill half of plate with vegetables join a weight loss program limit fast food meals to no more than 1 per week manage portion size prepare main meal at home 3 to 5 days each week read food labels for fat, fiber, carbohydrates and portion size reduce red meat to 2 to 3 times a week keep feet up while sitting wash and dry feet carefully every day wear comfortable, cotton socks wear comfortable, well-fitting shoes check blood pressure weekly choose a place to take my blood pressure (home, clinic or office, retail store) write blood pressure results in a log or diary learn about high blood pressure keep a blood pressure log take blood pressure log to all doctor appointments call doctor for signs and symptoms of high blood pressure develop an action plan for high blood pressure keep all doctor appointments take medications for blood pressure exactly as prescribed report new symptoms to your doctor eat more whole grains, fruits and vegetables, lean meats and healthy fats - call for medicine refill 2 or 3 days before it runs out - take all medications exactly as prescribed - call doctor with any symptoms you believe are related to your medicine - call doctor when you experience any new symptoms - go to all doctor appointments as scheduled - adhere to prescribed diet: heart healthy/ADA    Our next appointment is by telephone on 11-28-2021 at 145 pm  Please call the care guide team at [phone] if you need to cancel or reschedule your appointment.   If you are experiencing a Mental Health or [place] or need someone to talk to, please call the Suicide and Crisis  Lifeline: 988 call the [place] National Suicide Prevention Lifeline: 7080020436 or TTY: 856-010-6161 TTY 507-818-5164) to talk to a trained counselor call 1-800-273-TALK (toll free, 24 hour hotline)   Patient verbalizes understanding of instructions and care plan provided today and agrees to view in [place]. Active MyChart status confirmed with patient.    Noreene Larsson RN, MSN, [facility] Mobile: [phone]

## 2021-10-05 ENCOUNTER — Other Ambulatory Visit: Payer: Self-pay | Admitting: Family Medicine

## 2021-10-05 LAB — VERITOR FLU A/B WAIVED
Influenza A: NEGATIVE
Influenza B: NEGATIVE

## 2021-10-05 LAB — RAPID STREP SCREEN (MED CTR MEBANE ONLY): Strep Gp A Ag, IA W/Reflex: NEGATIVE

## 2021-10-05 LAB — CULTURE, GROUP A STREP: Strep A Culture: NEGATIVE

## 2021-10-05 NOTE — Telephone Encounter (Signed)
Requested medications are due for refill today.  yes  Requested medications are on the active medications list.  yes  Last refill. 09/23/2020 30g with 3 refills  Future visit scheduled.   yes  Notes to clinic.  Medication not delegated.    Requested Prescriptions  Pending Prescriptions Disp Refills   clobetasol ointment (TEMOVATE) 0.05 % [Pharmacy Med Name: Clobetasol Propionate 0.05 % External Ointment] 30 g 0    Sig: APPLY  OINTMENT TOPICALLY TWICE DAILY     Not Delegated - Dermatology:  Corticosteroids Failed - 10/05/2021  9:10 AM      Failed - This refill cannot be delegated      Passed - Valid encounter within last 12 months    Recent Outpatient Visits           3 days ago Upper respiratory tract infection, unspecified type   [place], Megan P, DO   4 months ago Routine general medical examination at a health care facility   [facility], [place] P, DO   6 months ago Viral upper respiratory tract infection   [facility] Jon Billings, NP   9 months ago 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 ([place])   [place], Megan P, DO   10 months ago 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 ([place])   [place], Megan P, DO       Future Appointments             In 1 month Fails, Barb Merino, DO [place], [place]   In 5 months Sindy Guadeloupe, MD [facility] [place]   In 5 months  [facility], [place]

## 2021-10-09 DIAGNOSIS — I10 Essential (primary) hypertension: Secondary | ICD-10-CM

## 2021-10-09 DIAGNOSIS — E1122 Type 2 diabetes mellitus with diabetic chronic kidney disease: Secondary | ICD-10-CM | POA: Diagnosis not present

## 2021-10-09 DIAGNOSIS — E782 Mixed hyperlipidemia: Secondary | ICD-10-CM

## 2021-10-09 DIAGNOSIS — N1831 Chronic kidney disease, stage 3a: Secondary | ICD-10-CM | POA: Diagnosis not present

## 2021-10-16 ENCOUNTER — Other Ambulatory Visit: Payer: Self-pay | Admitting: Family Medicine

## 2021-10-16 NOTE — Telephone Encounter (Signed)
Just filled 9 days ago for 90 day supply. ?Requested Prescriptions  ?Pending Prescriptions Disp Refills  ?? rosuvastatin (CRESTOR) 10 MG tablet [Pharmacy Med Name: ROSUVASTATIN TAB '10MG'$ ] 90 tablet 1  ?  Sig: TAKE 1 TABLET DAILY  ?  ? Cardiovascular:  Antilipid - Statins 2 Failed - 10/16/2021 10:09 AM  ?  ?  Failed - Cr in normal range and within 360 days  ?  Creatinine, Ser  ?Date Value Ref Range Status  ?05/22/2021 1.10 (H) 0.57 - 1.00 mg/dL Final  ?   ?  ?  Failed - Lipid Panel in normal range within the last 12 months  ?  Cholesterol, Total  ?Date Value Ref Range Status  ?05/22/2021 126 100 - 199 mg/dL Final  ? ?Cholesterol [place], [place]  ?Date Value Ref Range Status  ?07/04/2015 147 <200 mg/dL Final  ?  Comment:  ?                          Desirable                <200 ?                        Borderline High      200- 239 ?                        High                     >239 ?  ? ?LDL Chol Calc (NIH)  ?Date Value Ref Range Status  ?05/22/2021 60 0 - 99 mg/dL Final  ? ?HDL  ?Date Value Ref Range Status  ?05/22/2021 49 >39 mg/dL Final  ? ?Triglycerides  ?Date Value Ref Range Status  ?05/22/2021 86 0 - 149 mg/dL Final  ? ?Triglycerides Piccolo,Waived  ?Date Value Ref Range Status  ?07/04/2015 89 <150 mg/dL Final  ?  Comment:  ?                          Normal                   <150 ?                        Borderline High     150 - 199 ?                        High                200 - 499 ?                        Very High                >499 ?  ? ?  ?  ?  Passed - Patient is not pregnant  ?  ?  Passed - Valid encounter within last 12 months  ?  Recent Outpatient Visits   ?      ? 2 weeks ago Upper respiratory tract infection, unspecified type  ? [place], DO  ? 4 months ago Routine general medical examination at a health care facility  ? [place], DO  ? 6 months ago  Viral upper respiratory tract infection  ? [place], NP  ? 10 months ago 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 ([place])  ? [place], Megan P, DO  ? 10 months ago 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 ([place])  ? [place], [place] P, DO  ?  ?  ?Future Appointments   ?        ? In 1 month Wynetta Emery, Barb Merino, DO [employer], [facility]  ? In 4 months Sindy Guadeloupe, MD [facility] [place]  ? In 5 months  [place], [facility]  ?  ? ?  ?  ?  ? ? ?

## 2021-10-23 ENCOUNTER — Ambulatory Visit: Payer: Self-pay | Admitting: *Deleted

## 2021-10-23 NOTE — Telephone Encounter (Signed)
Reason for Disposition ? Back pain is a chronic symptom (recurrent or ongoing AND present > 4 weeks) ? ?Answer Assessment - Initial Assessment Questions ?1. ONSET: "When did the pain begin?"  ?    Year ago ?2. LOCATION: "Where does it hurt?" (upper, mid or lower back) ?    Lower back to left hip ?3. SEVERITY: "How bad is the pain?"  (e.g., Scale 1-10; mild, moderate, or severe) ?  - MILD (1-3): doesn't interfere with normal activities  ?  - MODERATE (4-7): interferes with normal activities or awakens from sleep  ?  - SEVERE (8-10): excruciating pain, unable to do any normal activities  ?    8/10 ?4. PATTERN: "Is the pain constant?" (e.g., yes, no; constant, intermittent)  ?    Constant ?5. RADIATION: "Does the pain shoot into your legs or elsewhere?" ?    To hip ?6. CAUSE:  "What do you think is causing the back pain?"  ?     ?7. BACK OVERUSE:  "Any recent lifting of heavy objects, strenuous work or exercise?" ?    No H/O back pain ?8. MEDICATIONS: "What have you taken so far for the pain?" (e.g., nothing, acetaminophen, NSAIDS) ?    Tylenol helps for a bit ?9. NEUROLOGIC SYMPTOMS: "Do you have any weakness, numbness, or problems with bowel/bladder control?" ?    no ?10. OTHER SYMPTOMS: "Do you have any other symptoms?" (e.g., fever, abdominal pain, burning with urination, blood in urine) ?      no ? ?Protocols used: Back Pain-A-AH ? ?

## 2021-10-23 NOTE — Telephone Encounter (Signed)
Will leave for Dr. Bryden review:) ?

## 2021-10-23 NOTE — Telephone Encounter (Signed)
?  Chief Complaint: Lower back pain ?Symptoms: Back pain radiates to right hip. H/O "For years" ?Frequency: Onset Sunday. Had "Injection a year ago" ?Pertinent Negatives: Patient denies numbness, no bowel, bladder issues ?Disposition: '[]'$ ED /'[]'$ Urgent Care (no appt availability in office) / '[]'$ Appointment(In office/virtual)/ '[]'$  [place] [facility]/ '[]'$ Home Care/ '[]'$ Refused Recommended Disposition /'[]'$ [place] [facility]/ '[x]'$  Follow-up with PCP ?Additional Notes: Pt states changed insurance and needs referral sent to [facility], Dr. Sharlet Salina for injection. "They really help." ?

## 2021-10-23 NOTE — Telephone Encounter (Signed)
Will patient need appointment for referral to be placed? ?

## 2021-10-25 ENCOUNTER — Ambulatory Visit (INDEPENDENT_AMBULATORY_CARE_PROVIDER_SITE_OTHER): Payer: No Typology Code available for payment source | Admitting: Nurse Practitioner

## 2021-10-25 ENCOUNTER — Encounter: Payer: Self-pay | Admitting: Nurse Practitioner

## 2021-10-25 ENCOUNTER — Other Ambulatory Visit: Payer: Self-pay

## 2021-10-25 VITALS — BP 126/56 | HR 54 | Ht 61.0 in | Wt 163.4 lb

## 2021-10-25 DIAGNOSIS — M5416 Radiculopathy, lumbar region: Secondary | ICD-10-CM

## 2021-10-25 NOTE — Telephone Encounter (Signed)
Patient seen by myself and referral placed. ?

## 2021-10-25 NOTE — Assessment & Plan Note (Signed)
Chronic, ongoing.  New referral placed to Dr. Sharlet Salina who has provided injections in past.  Continue current at home regimen. ?

## 2021-10-25 NOTE — Patient Instructions (Signed)
Chronic Back Pain When back pain lasts longer than 3 months, it is called chronic back pain. Pain may get worse at certain times (flare-ups). There are things you can do at home to manage your pain. Follow these instructions at home: Pay attention to any changes in your symptoms. Take these actions to help with your pain: Managing pain and stiffness   If told, put ice on the painful area. Your doctor may tell you to use ice for 24-48 hours after the flare-up starts. To do this: Put ice in a plastic bag. Place a towel between your skin and the bag. Leave the ice on for 20 minutes, 2-3 times a day. If told, put heat on the painful area. Do this as often as told by your doctor. Use the heat source that your doctor recommends, such as a moist heat pack or a heating pad. Place a towel between your skin and the heat source. Leave the heat on for 20-30 minutes. Take off the heat if your skin turns bright red. This is especially important if you are unable to feel pain, heat, or cold. You may have a greater risk of getting burned. Soak in a warm bath. This can help relieve pain. Activity  Avoid bending and other activities that make pain worse. When standing: Keep your upper back and neck straight. Keep your shoulders pulled back. Avoid slouching. When sitting: Keep your back straight. Relax your shoulders. Do not round your shoulders or pull them backward. Do not sit or stand in one place for long periods of time. Take short rest breaks during the day. Lying down or standing is usually better than sitting. Resting can help relieve pain. When sitting or lying down for a long time, do some mild activity or stretching. This will help to prevent stiffness and pain. Get regular exercise. Ask your doctor what activities are safe for you. Do not lift anything that is heavier than 10 lb (4.5 kg) or the limit that you are told, until your doctor says that it is safe. To prevent injury when you lift  things: Bend your knees. Keep the weight close to your body. Avoid twisting. Sleep on a firm mattress. Try lying on your side with your knees slightly bent. If you lie on your back, put a pillow under your knees. Medicines Treatment may include medicines for pain and swelling taken by mouth or put on the skin, prescription pain medicine, or muscle relaxants. Take over-the-counter and prescription medicines only as told by your doctor. Ask your doctor if the medicine prescribed to you: Requires you to avoid driving or using machinery. Can cause trouble pooping (constipation). You may need to take these actions to prevent or treat trouble pooping: Drink enough fluid to keep your pee (urine) pale yellow. Take over-the-counter or prescription medicines. Eat foods that are high in fiber. These include beans, whole grains, and fresh fruits and vegetables. Limit foods that are high in fat and sugars. These include fried or sweet foods. General instructions Do not use any products that contain nicotine or tobacco, such as cigarettes, e-cigarettes, and chewing tobacco. If you need help quitting, ask your doctor. Keep all follow-up visits as told by your doctor. This is important. Contact a doctor if: Your pain does not get better with rest or medicine. Your pain gets worse, or you have new pain. You have a high fever. You lose weight very quickly. You have trouble doing your normal activities. Get help right away if: One   or both of your legs or feet feel weak. One or both of your legs or feet lose feeling (have numbness). You have trouble controlling when you poop (have a bowel movement) or pee (urinate). You have bad back pain and: You feel like you may vomit (nauseous), or you vomit. You have pain in your belly (abdomen). You have shortness of breath. You faint. Summary When back pain lasts longer than 3 months, it is called chronic back pain. Pain may get worse at certain times  (flare-ups). Use ice and heat as told by your doctor. Your doctor may tell you to use ice after flare-ups. This information is not intended to replace advice given to you by your health care provider. Make sure you discuss any questions you have with your health care provider. Document Revised: 09/08/2019 Document Reviewed: 09/08/2019 Elsevier Patient Education  2022 [employer]  

## 2021-10-25 NOTE — Progress Notes (Signed)
? ?BP (!) 126/56 (BP Location: Left Arm, Cuff Size: Normal)   Pulse (!) 54   Ht '5\' 1"'$  (1.549 m)   Wt 163 lb 6.4 oz (74.1 kg)   LMP  (LMP Unknown)   SpO2 99%   BMI 30.87 kg/m?   ? ?Subjective:  ? ? Patient ID: Teresa [name], female    DOB: 1946-05-06, 76 y.o.   MRN: [id number] ? ?HPI: ?Teresa [name] is a 76 y.o. female ? ?Chief Complaint  ?Patient presents with  ? Referral  ?  Patient states she is here to discuss having a referral place for her back and hip pain. Patient states she use to have injections, but her insurance has changed and she now has to have a referral placed in order to resume her injections. Patient states her right hip started back bothering her on Sunday.   ? ?BACK PAIN ?In past was followed by Dr. Sharlet Salina at [facility], last visit was 12/29/20 when received injection for pain.  Pain to lower back and right hip is chronic issue. ?Duration: months ?Mechanism of injury: unknown ?Location: Right and low back ?Onset: gradual ?Severity: 7/10 ?Quality: just hurts ?Frequency: constant ?Radiation: R leg above the knee ?Aggravating factors: prolonged sitting ?Alleviating factors: injections ?Status: worse ?Treatments attempted: Tylenol and injections  ?Relief with NSAIDs?: none ?Nighttime pain:  yes ?Paresthesias / decreased sensation:  no ?Bowel / bladder incontinence:  no ?Fevers:  no ?Dysuria / urinary frequency:  no  ? ?Relevant past medical, surgical, family and social history reviewed and updated as indicated. Interim medical history since our last visit reviewed. ?Allergies and medications reviewed and updated. ? ?Review of Systems  ?Constitutional:  Negative for activity change, appetite change, diaphoresis, fatigue and fever.  ?Respiratory:  Negative for cough, chest tightness and shortness of breath.   ?Cardiovascular:  Negative for chest pain, palpitations and leg swelling.  ?Gastrointestinal: Negative.   ?Musculoskeletal:  Positive for back pain.  ?Neurological: Negative.    ?Psychiatric/Behavioral: Negative.    ? ?Per HPI unless specifically indicated above ? ?   ?Objective:  ?  ?BP (!) 126/56 (BP Location: Left Arm, Cuff Size: Normal)   Pulse (!) 54   Ht '5\' 1"'$  (1.549 m)   Wt 163 lb 6.4 oz (74.1 kg)   LMP  (LMP Unknown)   SpO2 99%   BMI 30.87 kg/m?   ?Wt Readings from Last 3 Encounters:  ?10/25/21 163 lb 6.4 oz (74.1 kg)  ?09/03/21 168 lb 11.2 oz (76.5 kg)  ?05/22/21 167 lb (75.8 kg)  ?  ?Physical Exam ?Vitals and nursing note reviewed.  ?Constitutional:   ?   General: She is awake. She is not in acute distress. ?   Appearance: She is well-developed and well-groomed. She is obese. She is not ill-appearing or toxic-appearing.  ?HENT:  ?   Head: Normocephalic.  ?   Right Ear: Hearing normal.  ?   Left Ear: Hearing normal.  ?Eyes:  ?   General: Lids are normal.     ?   Right eye: No discharge.     ?   Left eye: No discharge.  ?   Conjunctiva/sclera: Conjunctivae normal.  ?   Pupils: Pupils are equal, round, and reactive to light.  ?Neck:  ?   Thyroid: No thyromegaly.  ?   Vascular: No carotid bruit.  ?Cardiovascular:  ?   Rate and Rhythm: Normal rate and regular rhythm.  ?   Heart sounds: Normal heart sounds. No murmur  heard. ?  No gallop.  ?Pulmonary:  ?   Effort: Pulmonary effort is normal. No accessory muscle usage or respiratory distress.  ?   Breath sounds: Normal breath sounds.  ?Abdominal:  ?   General: Bowel sounds are normal.  ?   Palpations: Abdomen is soft. There is no hepatomegaly or splenomegaly.  ?Musculoskeletal:  ?   Cervical back: Normal range of motion and neck supple.  ?   Lumbar back: Tenderness present. No swelling or signs of trauma. Decreased range of motion. Negative right straight leg raise test and negative left straight leg raise test.  ?   Right lower leg: No edema.  ?   Left lower leg: No edema.  ?Lymphadenopathy:  ?   Cervical: No cervical adenopathy.  ?Skin: ?   General: Skin is warm and dry.  ?Neurological:  ?   Mental Status: She is alert and  oriented to person, place, and time.  ?Psychiatric:     ?   Attention and Perception: Attention normal.     ?   Mood and Affect: Mood normal.     ?   Speech: Speech normal.     ?   Behavior: Behavior normal. Behavior is cooperative.     ?   Thought Content: Thought content normal.  ? ?Results for orders placed or performed in visit on 10/02/21  ?Rapid Strep Screen ([facility] ONLY)  ? Specimen: Other  ? Other  ?Result Value Ref Range  ? Strep Gp A Ag, IA W/Reflex Negative Negative  ?Novel Coronavirus, NAA ([facility])  ? Specimen: Saline  ?Result Value Ref Range  ? SARS-CoV-2, NAA Not Detected Not Detected  ?Culture, Group A Strep  ? Other  ?Result Value Ref Range  ? Strep A Culture Negative   ?Veritor Flu A/B Waived  ?Result Value Ref Range  ? Influenza A Negative Negative  ? Influenza B Negative Negative  ? ?   ?Assessment & Plan:  ? ?Problem List Items Addressed This Visit   ? ?  ? Nervous and Auditory  ? Lumbar radiculopathy - Primary  ?  Chronic, ongoing.  New referral placed to Dr. Sharlet Salina who has provided injections in past.  Continue current at home regimen. ?  ?  ? Relevant Orders  ? Ambulatory referral to Pain Clinic  ?  ? ?Follow up plan: ?Return for as scheduled with Dr. Lenna Sciara upcoming. ? ? ? ? ? ?

## 2021-10-31 DIAGNOSIS — M5136 Other intervertebral disc degeneration, lumbar region: Secondary | ICD-10-CM | POA: Diagnosis not present

## 2021-10-31 DIAGNOSIS — M48062 Spinal stenosis, lumbar region with neurogenic claudication: Secondary | ICD-10-CM | POA: Diagnosis not present

## 2021-10-31 DIAGNOSIS — M5416 Radiculopathy, lumbar region: Secondary | ICD-10-CM | POA: Diagnosis not present

## 2021-11-09 DIAGNOSIS — M5416 Radiculopathy, lumbar region: Secondary | ICD-10-CM | POA: Diagnosis not present

## 2021-11-09 DIAGNOSIS — M48062 Spinal stenosis, lumbar region with neurogenic claudication: Secondary | ICD-10-CM | POA: Diagnosis not present

## 2021-11-16 ENCOUNTER — Other Ambulatory Visit: Payer: Self-pay | Admitting: Family Medicine

## 2021-11-16 MED ORDER — ONETOUCH DELICA LANCETS 33G MISC: 1.0000 | Freq: Every day | 3 refills | Status: DC

## 2021-11-16 NOTE — Telephone Encounter (Signed)
Patient requesting refill of Lancets ?

## 2021-11-16 NOTE — Telephone Encounter (Signed)
Copied from [place] [phone]. Topic: Quick Communication - Rx Refill/Question ?>> Nov 16, 2021  9:33 AM Leward Quan A wrote: ?Medication: OneTouch? Delica? Plus glucometer and accessories  ? ?Has the patient contacted their pharmacy? No. Spoke to insurance they request Rx  ?(Agent: If no, request that the patient contact the pharmacy for the refill. If patient does not wish to contact the pharmacy document the reason why and proceed with request.) ?(Agent: If yes, when and what did the pharmacy advise?) ? ?Preferred Pharmacy (with phone number or street name): [facility], [place] to Registered [facility] Sites  ?Phone:  [phone] ?Fax:  [phone] ? ? ? ?Has the patient been seen for an appointment in the last year OR does the patient have an upcoming appointment? Yes.   ? ?Agent: Please be advised that RX refills may take up to 3 business days. We ask that you follow-up with your pharmacy. ?

## 2021-11-16 NOTE — Telephone Encounter (Signed)
Called and spoke with pt, she states that she needs a new rx for One touch ultra plus lancets. Pt states that she has the strips and glucometer already just needs lancets. ?

## 2021-11-20 ENCOUNTER — Encounter: Payer: Self-pay | Admitting: Family Medicine

## 2021-11-20 ENCOUNTER — Ambulatory Visit (INDEPENDENT_AMBULATORY_CARE_PROVIDER_SITE_OTHER): Payer: No Typology Code available for payment source | Admitting: Family Medicine

## 2021-11-20 VITALS — BP 144/76 | HR 62 | Temp 97.9°F | Wt 164.2 lb

## 2021-11-20 DIAGNOSIS — E538 Deficiency of other specified B group vitamins: Secondary | ICD-10-CM | POA: Diagnosis not present

## 2021-11-20 DIAGNOSIS — E782 Mixed hyperlipidemia: Secondary | ICD-10-CM | POA: Diagnosis not present

## 2021-11-20 DIAGNOSIS — N1831 Chronic kidney disease, stage 3a: Secondary | ICD-10-CM | POA: Diagnosis not present

## 2021-11-20 DIAGNOSIS — E039 Hypothyroidism, unspecified: Secondary | ICD-10-CM | POA: Diagnosis not present

## 2021-11-20 DIAGNOSIS — I129 Hypertensive chronic kidney disease with stage 1 through stage 4 chronic kidney disease, or unspecified chronic kidney disease: Secondary | ICD-10-CM

## 2021-11-20 DIAGNOSIS — E1122 Type 2 diabetes mellitus with diabetic chronic kidney disease: Secondary | ICD-10-CM | POA: Diagnosis not present

## 2021-11-20 DIAGNOSIS — D692 Other nonthrombocytopenic purpura: Secondary | ICD-10-CM

## 2021-11-20 LAB — BAYER DCA HB A1C WAIVED: HB A1C (BAYER DCA - WAIVED): 6.6 % — ABNORMAL HIGH (ref 4.8–5.6)

## 2021-11-20 MED ORDER — ONETOUCH DELICA LANCETS 33G MISC: 1.0000 | Freq: Every day | 3 refills | Status: DC

## 2021-11-20 MED ORDER — CHLORTHALIDONE 25 MG PO TABS
25.0000 mg | ORAL_TABLET | Freq: Every day | ORAL | 1 refills | Status: DC
Start: 2021-11-20 — End: 2022-05-30

## 2021-11-20 MED ORDER — ROSUVASTATIN CALCIUM 10 MG PO TABS: 10.0000 mg | ORAL_TABLET | Freq: Every day | ORAL | 1 refills | Status: DC

## 2021-11-20 MED ORDER — LISINOPRIL 40 MG PO TABS: 60.0000 mg | ORAL_TABLET | Freq: Every day | ORAL | 1 refills | Status: DC

## 2021-11-20 MED ORDER — METFORMIN HCL ER 500 MG PO TB24
1000.0000 mg | ORAL_TABLET | Freq: Two times a day (BID) | ORAL | 1 refills | Status: DC
Start: 2021-11-20 — End: 2022-05-06

## 2021-11-20 MED ORDER — CLOPIDOGREL BISULFATE 75 MG PO TABS
75.0000 mg | ORAL_TABLET | Freq: Every day | ORAL | 3 refills | Status: DC
Start: 2021-11-20 — End: 2022-12-03

## 2021-11-20 NOTE — Assessment & Plan Note (Signed)
Rechecking labs today. Await results. Treat as needed.  °

## 2021-11-20 NOTE — Assessment & Plan Note (Signed)
Under good control on current regimen. Continue current regimen. Continue to monitor. Call with any concerns. Refills given. Labs drawn today.   

## 2021-11-20 NOTE — Progress Notes (Signed)
? ?BP (!) 144/76   Pulse 62   Temp 97.9 ?F (36.6 ?C)   Wt 164 lb 3.2 oz (74.5 kg)   LMP  (LMP Unknown)   SpO2 98%   BMI 31.03 kg/m?   ? ?Subjective:  ? ? Patient ID: Teresa [name], female    DOB: [birth year], 76 y.o.   MRN: [id number] ? ?HPI: ?Teresa [name] is a 76 y.o. female ? ?Chief Complaint  ?Patient presents with  ? Diabetes  ? Hypertension  ? Hyperlipidemia  ? Hypothyroidism  ? Medication Consultation  ?  Patient states she ordered a medication called Golo for weight loss, would like to know if its safe to take considering her diabetes.   ? ?HYPERTENSION / HYPERLIPIDEMIA ?Satisfied with current treatment? yes ?Duration of hypertension: chronic ?BP monitoring frequency: daily in the AM in the 140s ?BP medication side effects: no ?Past BP meds: chlorthalidone, lisinopril ?Duration of hyperlipidemia: chronic ?Cholesterol medication side effects: no ?Cholesterol supplements: none ?Past cholesterol medications: crestor ?Medication compliance: excellent compliance ?Aspirin: yes ?Recent stressors: no ?Recurrent headaches: no ?Visual changes: no ?Palpitations: yes ?Dyspnea: no ?Chest pain: no ?Lower extremity edema: no ?Dizzy/lightheaded: no ? ?DIABETES ?Hypoglycemic episodes:no ?Polydipsia/polyuria: no ?Visual disturbance: no ?Chest pain: no ?Paresthesias: no ?Glucose Monitoring: no ? Accucheck frequency: Not Checking ?Taking Insulin?: no ?Blood Pressure Monitoring: not checking ?Retinal Examination: Up to Date ?Foot Exam: Up to Date ?Diabetic Education: Completed ?Pneumovax: Up to Date ?Influenza: Up to Date ?Aspirin: yes ? ?HYPOTHYROIDISM ?Thyroid control status:stable ?Satisfied with current treatment? yes ?Medication side effects: no ?Medication compliance: excellent compliance ?Etiology of hypothyroidism:  ?Recent dose adjustment:no ?Fatigue: yes ?Cold intolerance: no ?Heat intolerance: no ?Weight gain: yes ?Weight loss: no ?Constipation: no ?Diarrhea/loose stools: no ?Palpitations: no ?Lower  extremity edema: no ?Anxiety/depressed mood: no ? ?Relevant past medical, surgical, family and social history reviewed and updated as indicated. Interim medical history since our last visit reviewed. ?Allergies and medications reviewed and updated. ? ?Review of Systems  ?Constitutional: Negative.   ?Respiratory: Negative.    ?Cardiovascular: Negative.   ?Gastrointestinal: Negative.   ?Musculoskeletal: Negative.   ?Neurological: Negative.   ?Psychiatric/Behavioral: Negative.    ? ?Per HPI unless specifically indicated above ? ?   ?Objective:  ?  ?BP (!) 144/76   Pulse 62   Temp 97.9 ?F (36.6 ?C)   Wt 164 lb 3.2 oz (74.5 kg)   LMP  (LMP Unknown)   SpO2 98%   BMI 31.03 kg/m?   ?Wt Readings from Last 3 Encounters:  ?11/20/21 164 lb 3.2 oz (74.5 kg)  ?10/25/21 163 lb 6.4 oz (74.1 kg)  ?09/03/21 168 lb 11.2 oz (76.5 kg)  ?  ?Physical Exam ?Vitals and nursing note reviewed.  ?Constitutional:   ?   General: She is not in acute distress. ?   Appearance: Normal appearance. She is not ill-appearing, toxic-appearing or diaphoretic.  ?HENT:  ?   Head: Normocephalic and atraumatic.  ?   Right Ear: External ear normal.  ?   Left Ear: External ear normal.  ?   Nose: Nose normal.  ?   Mouth/Throat:  ?   Mouth: Mucous membranes are moist.  ?   Pharynx: Oropharynx is clear.  ?Eyes:  ?   General: No scleral icterus.    ?   Right eye: No discharge.     ?   Left eye: No discharge.  ?   Extraocular Movements: Extraocular movements intact.  ?   Conjunctiva/sclera: Conjunctivae normal.  ?  Pupils: Pupils are equal, round, and reactive to light.  ?Cardiovascular:  ?   Rate and Rhythm: Normal rate and regular rhythm.  ?   Pulses: Normal pulses.  ?   Heart sounds: Normal heart sounds. No murmur heard. ?  No friction rub. No gallop.  ?Pulmonary:  ?   Effort: Pulmonary effort is normal. No respiratory distress.  ?   Breath sounds: Normal breath sounds. No stridor. No wheezing, rhonchi or rales.  ?Chest:  ?   Chest wall: No tenderness.   ?Musculoskeletal:     ?   General: Normal range of motion.  ?   Cervical back: Normal range of motion and neck supple.  ?Skin: ?   General: Skin is warm and dry.  ?   Capillary Refill: Capillary refill takes less than 2 seconds.  ?   Coloration: Skin is not jaundiced or pale.  ?   Findings: No bruising, erythema, lesion or rash.  ?Neurological:  ?   General: No focal deficit present.  ?   Mental Status: She is alert and oriented to person, place, and time. Mental status is at baseline.  ?Psychiatric:     ?   Mood and Affect: Mood normal.     ?   Behavior: Behavior normal.     ?   Thought Content: Thought content normal.     ?   Judgment: Judgment normal.  ? ? ?Results for orders placed or performed in visit on 11/20/21  ?Bayer DCA Hb A1c Waived  ?Result Value Ref Range  ? HB A1C (BAYER DCA - WAIVED) 6.6 (H) 4.8 - 5.6 %  ? ?   ?Assessment & Plan:  ? ?Problem List Items Addressed This Visit   ? ?  ? Cardiovascular and Mediastinum  ? Senile purpura ([place])  ?  Reassured patient.  ?  ?  ? Relevant Medications  ? lisinopril (ZESTRIL) 40 MG tablet  ? rosuvastatin (CRESTOR) 10 MG tablet  ? chlorthalidone (HYGROTON) 25 MG tablet  ?  ? Endocrine  ? Hypothyroidism  ?  Rechecking labs today. Await results. Treat as needed.  ?  ?  ? Relevant Orders  ? Comprehensive metabolic panel  ? TSH  ? Type 2 diabetes mellitus with stage 3 chronic kidney disease, without long-term current use of insulin ([place]) - Primary  ?  Doing great with A1c of 6.6. Continue current regimen. Call with any concerns. Refills given today.  ?  ?  ? Relevant Medications  ? lisinopril (ZESTRIL) 40 MG tablet  ? metFORMIN (GLUCOPHAGE-XR) 500 MG 24 hr tablet  ? rosuvastatin (CRESTOR) 10 MG tablet  ? Other Relevant Orders  ? Comprehensive metabolic panel  ? Bayer DCA Hb A1c Waived (Completed)  ?  ? Genitourinary  ? Hypertensive CKD (chronic kidney disease)  ?  Running high. Allergic to amlodipine. On chlorthalidone. Borderline bradycardic not on betablocker. Do not  want to jump to hydralzine. Will increase her lisinopril to '60mg'$  and recheck 1 month.  ? ?  ?  ? Relevant Orders  ? Comprehensive metabolic panel  ? CKD (chronic kidney disease), stage III ([place])  ?  Rechecking labs today. Await results. Treat as needed.  ?  ?  ?  ? Other  ? Hyperlipidemia  ?  Under good control on current regimen. Continue current regimen. Continue to monitor. Call with any concerns. Refills given. Labs drawn today.  ? ?  ?  ? Relevant Medications  ? lisinopril (ZESTRIL) 40 MG tablet  ?  rosuvastatin (CRESTOR) 10 MG tablet  ? chlorthalidone (HYGROTON) 25 MG tablet  ? Other Relevant Orders  ? Comprehensive metabolic panel  ? Lipid Panel w/o Chol/HDL Ratio  ? B12 deficiency  ?  Rechecking labs today. Await results. Treat as needed.  ?  ?  ? Relevant Orders  ? CBC with Differential/Platelet  ? Comprehensive metabolic panel  ? B12  ?  ? ?Follow up plan: ?Return in about 4 weeks (around 12/18/2021) for follow up blood pressure. ? ? ? ? ? ?

## 2021-11-20 NOTE — Assessment & Plan Note (Addendum)
Running high. Allergic to amlodipine. On chlorthalidone. Borderline bradycardic not on betablocker. Do not want to jump to hydralzine. Will increase her lisinopril to '60mg'$  and recheck 1 month.  ? ?

## 2021-11-20 NOTE — Assessment & Plan Note (Signed)
Reassured patient. 

## 2021-11-20 NOTE — Assessment & Plan Note (Signed)
Doing great with A1c of 6.6. Continue current regimen. Call with any concerns. Refills given today.  ?

## 2021-11-21 ENCOUNTER — Other Ambulatory Visit: Payer: Self-pay | Admitting: Family Medicine

## 2021-11-21 ENCOUNTER — Encounter: Payer: Self-pay | Admitting: Family Medicine

## 2021-11-21 DIAGNOSIS — D649 Anemia, unspecified: Secondary | ICD-10-CM

## 2021-11-21 LAB — CBC WITH DIFFERENTIAL/PLATELET
Basophils Absolute: 0 10*3/uL (ref 0.0–0.2)
Basos: 0 %
EOS (ABSOLUTE): 0.1 10*3/uL (ref 0.0–0.4)
Eos: 1 %
Hematocrit: 30.7 % — ABNORMAL LOW (ref 34.0–46.6)
Hemoglobin: 9.8 g/dL — ABNORMAL LOW (ref 11.1–15.9)
Immature Grans (Abs): 0 10*3/uL (ref 0.0–0.1)
Immature Granulocytes: 0 %
Lymphocytes Absolute: 2 10*3/uL (ref 0.7–3.1)
Lymphs: 29 %
MCH: 29.5 pg (ref 26.6–33.0)
MCHC: 31.9 g/dL (ref 31.5–35.7)
MCV: 93 fL (ref 79–97)
Monocytes Absolute: 0.6 10*3/uL (ref 0.1–0.9)
Monocytes: 8 %
Neutrophils Absolute: 4.3 10*3/uL (ref 1.4–7.0)
Neutrophils: 62 %
Platelets: 234 10*3/uL (ref 150–450)
RBC: 3.32 x10E6/uL — ABNORMAL LOW (ref 3.77–5.28)
RDW: 12.5 % (ref 11.7–15.4)
WBC: 7.1 10*3/uL (ref 3.4–10.8)

## 2021-11-21 LAB — COMPREHENSIVE METABOLIC PANEL
ALT: 13 IU/L (ref 0–32)
AST: 17 IU/L (ref 0–40)
Albumin/Globulin Ratio: 1.8 (ref 1.2–2.2)
Albumin: 4.4 g/dL (ref 3.7–4.7)
Alkaline Phosphatase: 75 IU/L (ref 44–121)
BUN/Creatinine Ratio: 21 (ref 12–28)
BUN: 21 mg/dL (ref 8–27)
Bilirubin Total: <0.2 mg/dL (ref 0.0–1.2)
CO2: 22 mmol/L (ref 20–29)
Calcium: 9.9 mg/dL (ref 8.7–10.3)
Chloride: 108 mmol/L — ABNORMAL HIGH (ref 96–106)
Creatinine, Ser: 1.02 mg/dL — ABNORMAL HIGH (ref 0.57–1.00)
Globulin, Total: 2.4 g/dL (ref 1.5–4.5)
Glucose: 122 mg/dL — ABNORMAL HIGH (ref 70–99)
Potassium: 4.5 mmol/L (ref 3.5–5.2)
Sodium: 146 mmol/L — ABNORMAL HIGH (ref 134–144)
Total Protein: 6.8 g/dL (ref 6.0–8.5)
eGFR: 57 mL/min/{1.73_m2} — ABNORMAL LOW (ref >59)

## 2021-11-21 LAB — TSH: TSH: 1.44 u[IU]/mL (ref 0.450–4.500)

## 2021-11-21 LAB — LIPID PANEL W/O CHOL/HDL RATIO
Cholesterol, Total: 122 mg/dL (ref 100–199)
HDL: 51 mg/dL (ref >39)
LDL Chol Calc (NIH): 54 mg/dL (ref 0–99)
Triglycerides: 88 mg/dL (ref 0–149)
VLDL Cholesterol Cal: 17 mg/dL (ref 5–40)

## 2021-11-21 LAB — VITAMIN B12: Vitamin B-12: 749 pg/mL (ref 232–1245)

## 2021-11-21 NOTE — Progress Notes (Signed)
I think July follow up should be ok since it is not a big drop.

## 2021-11-28 ENCOUNTER — Telehealth: Payer: No Typology Code available for payment source

## 2021-11-28 ENCOUNTER — Ambulatory Visit (INDEPENDENT_AMBULATORY_CARE_PROVIDER_SITE_OTHER): Payer: No Typology Code available for payment source

## 2021-11-28 DIAGNOSIS — I10 Essential (primary) hypertension: Secondary | ICD-10-CM

## 2021-11-28 DIAGNOSIS — E782 Mixed hyperlipidemia: Secondary | ICD-10-CM

## 2021-11-28 DIAGNOSIS — E1122 Type 2 diabetes mellitus with diabetic chronic kidney disease: Secondary | ICD-10-CM

## 2021-11-28 NOTE — Chronic Care Management (AMB) (Signed)
?Chronic Care Management  ? ?CCM RN Visit Note ? ?11/28/2021 ?Name: Teresa [name] MRN: [id number] DOB: September 15, 1945 ? ?Subjective: ?Teresa [name] is a 76 y.o. year old female who is a primary care patient of Jakeira, Seeman, DO. The care management team was consulted for assistance with disease management and care coordination needs.   ? ?Engaged with patient by telephone for follow up visit in response to provider referral for case management and/or care coordination services.  ? ?Consent to Services:  ?The patient was given information about Chronic Care Management services, agreed to services, and gave verbal consent prior to initiation of services.  Please see initial visit note for detailed documentation.  ? ?Patient agreed to services and verbal consent obtained.  ? ?Assessment: Review of patient past medical history, allergies, medications, health status, including review of consultants reports, laboratory and other test data, was performed as part of comprehensive evaluation and provision of chronic care management services.  ? ?SDOH (Social Determinants of Health) assessments and interventions performed:   ? ?CCM Care Plan ? ?Allergies  ?Allergen Reactions  ? Amlodipine Other (See Comments)  ?  Ankle swelling  ? Atorvastatin   ?  cramps  ? Evista [Raloxifene] Nausea Only  ? ? ?Outpatient Encounter Medications as of 11/28/2021  ?Medication Sig  ? ASPIRIN 81 PO Take by mouth daily.  ? blood glucose meter kit and supplies Dispense based on patient and insurance preference. Use up to four times daily as directed. (FOR ICD-9 250.00, 250.01).  ? Blood Glucose Monitoring Suppl (ONETOUCH VERIO) w/Device KIT 1 each by Does not apply route daily.  ? chlorthalidone (HYGROTON) 25 MG tablet Take 1 tablet (25 mg total) by mouth daily.  ? clobetasol ointment (TEMOVATE) 0.05 % APPLY  OINTMENT TOPICALLY TWICE DAILY  ? clopidogrel (PLAVIX) 75 MG tablet Take 1 tablet (75 mg total) by mouth daily.  ? folic acid (FOLVITE)  814 MCG tablet Take 400 mcg by mouth daily.  ? glucose blood (ONETOUCH VERIO) test strip Use as instructed  ? glucose blood test strip 1 each by Other route 2 (two) times daily. Use as instructed, Dx: E11.22  ? levothyroxine (SYNTHROID) 50 MCG tablet TAKE 1 TABLET DAILY  ? lisinopril (ZESTRIL) 40 MG tablet Take 1.5 tablets (60 mg total) by mouth daily.  ? metFORMIN (GLUCOPHAGE-XR) 500 MG 24 hr tablet Take 2 tablets (1,000 mg total) by mouth 2 (two) times daily.  ? OneTouch Delica Lancets 48J MISC 1 each by Does not apply route daily.  ? rosuvastatin (CRESTOR) 10 MG tablet Take 1 tablet (10 mg total) by mouth daily.  ? vitamin B-12 (CYANOCOBALAMIN) 1000 MCG tablet Take 1,000 mcg by mouth daily.  ? ?No facility-administered encounter medications on file as of 11/28/2021.  ? ? ?Patient Active Problem List  ? Diagnosis Date Noted  ? Pulmonary HTN ([place]) 05/22/2021  ? Senile purpura ([place]) 05/22/2021  ? History of colonic polyps 05/18/2020  ? Bradycardia 02/11/2018  ? Lumbar radiculopathy 09/27/2017  ? Advanced care planning/counseling discussion 01/22/2017  ? Type 2 diabetes mellitus with stage 3 chronic kidney disease, without long-term current use of insulin ([place]) 07/04/2015  ? CVA (cerebral infarction) 04/03/2015  ? Obesity 04/03/2015  ? Lichen sclerosus 85/63/1497  ? Hypothyroidism 04/03/2015  ? Osteopenia 04/03/2015  ? Hyperlipidemia 04/03/2015  ? B12 deficiency 04/03/2015  ? 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  ? CKD (chronic kidney disease), stage III ([place]) 04/03/2015  ? Anemia 03/10/2014  ? ? ?Conditions to be  addressed/monitored:HTN, HLD, DMII, and Resolved URI, Resolved back pain and discomfort ? ?Care Plan : RNCM: General Plan of Care (Adult) for Chronic Disease Management and Care Coordination Needs  ?Updates made by Vanita Ingles, RN since 11/28/2021 12:00 AM  ?  ? ?Problem: RNCM: Development of Plan of Care for Chronic Disease Management (DM, HTN, HLD, and URI)   ?Priority: High  ?   ? ?Long-Range Goal: RNCM: Effective management of plan of care for Chronic Disease Management (HTN, HLD, DM, URI)   ?Start Date: 10/03/2021  ?Expected End Date: 10/03/2022  ?Priority: High  ?Note:   ?Current Barriers:  ?Knowledge Deficits related to plan of care for management of HTN, HLD, DMII, and Current URI  ?Chronic Disease Management support and education needs related to HTN, HLD, DMII, and Current URI ? ?RNCM Clinical Goal(s):  ?Patient will verbalize basic understanding of HTN, HLD, DMII, and Current URI disease process and self health management plan as evidenced by keeping appointments, calling the office for changes in conditions, questions, or concerns, and working with the CCM team to optimize health and well being ?demonstrate understanding of rationale for each prescribed medication as evidenced by compliance with medications and calling for medications refills before running out of medications    ?attend all scheduled medical appointments:12-18-2021 at 0940 am  as evidenced by keeping appointments and calling for schedule change needs        ?demonstrate improved and ongoing health management independence as evidenced by stable conditions, lab work on a regular basis, routine office visits, calling the office for changes or questions.         ?demonstrate ongoing self health care management ability for effective management of chronic conditions as evidenced by working with the CCM team through collaboration with Consulting civil engineer, provider, and care team.  ? ?Interventions: ?1:1 collaboration with primary care provider regarding development and update of comprehensive plan of care as evidenced by provider attestation and co-signature ?Inter-disciplinary care team collaboration (see longitudinal plan of care) ?Evaluation of current treatment plan related to  self management and patient's adherence to plan as established by provider ? ? ?Diabetes:  (Status: Goal on Track (progressing): YES.) Long Term Goal   ? ?Lab Results  ?Component Value Date  ? HGBA1C 6.6 (H) 11/20/2021  ?Assessed patient's understanding of A1c goal: <7%> 11-28-2021: The patient is at goal and knows the goal of keeping A1C below 7 ?Provided education to patient about basic DM disease process. 11-28-2021: Has a good understanding of DM and how to effectively manage.; ?Reviewed medications with patient and discussed importance of medication adherence. 11-28-2021: Is compliant with medications. Denies any new medication needs at this time        ?Reviewed prescribed diet with patient heart healthy/ADA diet. 11-28-2021: Is compliant with heart healthy/ADA diet  ; ?Counseled on importance of regular laboratory monitoring as prescribed. 11-28-2021: Has regular lab work done        ?Discussed plans with patient for ongoing care management follow up and provided patient with direct contact information for care management team;      ?Provided patient with written educational materials related to hypo and hyperglycemia and importance of correct treatment. 11-28-2021: Knows how to effectively treat hypo or hyperglycemia. Knows the sx and sx.       ?Reviewed scheduled/upcoming provider appointments including: 12-18-2021;         ?Advised patient, providing education and rationale, to check cbg before meals and at bedtime, when you have symptoms of  low or high blood sugar, and before and after exercise and record        ?call provider for findings outside established parameters;       ?Review of patient status, including review of consultants reports, relevant laboratory and other test results, and medications completed;       ?Screening for signs and symptoms of depression related to chronic disease state;        ?Assessed social determinant of health barriers;       ?Eye exam on 08-21-2021- no changes in vision, got good report  ? ?Current URI 10-03-2021  (Status: Goal Met.) Long Term Goal 11-28-2021: Closing this goal. The patient denies any current issues with  respiratory problems. States seasonal allergies and sneezing at times but no acute findings noted.  ?Evaluation of current treatment plan related to  Current URI ,  self-management and patient's adherence to plan as

## 2021-11-28 NOTE — Patient Instructions (Signed)
Visit Information ? ?Thank you for taking time to visit with me today. Please don't hesitate to contact me if I can be of assistance to you before our next scheduled telephone appointment. ? ?Following are the goals we discussed today:  ?RNCM Clinical Goal(s):  ?Patient will verbalize basic understanding of HTN, HLD, DMII, and Current URI disease process and self health management plan as evidenced by keeping appointments, calling the office for changes in conditions, questions, or concerns, and working with the CCM team to optimize health and well being ?demonstrate understanding of rationale for each prescribed medication as evidenced by compliance with medications and calling for medications refills before running out of medications    ?attend all scheduled medical appointments:12-18-2021 at 0940 am  as evidenced by keeping appointments and calling for schedule change needs        ?demonstrate improved and ongoing health management independence as evidenced by stable conditions, lab work on a regular basis, routine office visits, calling the office for changes or questions.         ?demonstrate ongoing self health care management ability for effective management of chronic conditions as evidenced by working with the CCM team through collaboration with Consulting civil engineer, provider, and care team.  ?  ?Interventions: ?1:1 collaboration with primary care provider regarding development and update of comprehensive plan of care as evidenced by provider attestation and co-signature ?Inter-disciplinary care team collaboration (see longitudinal plan of care) ?Evaluation of current treatment plan related to  self management and patient's adherence to plan as established by provider ?  ?  ?Diabetes:  (Status: Goal on Track (progressing): YES.) Long Term Goal  ?  ?     ?Lab Results  ?Component Value Date  ?  HGBA1C 6.6 (H) 11/20/2021  ?Assessed patient's understanding of A1c goal: <7%> 11-28-2021: The patient is at goal and knows  the goal of keeping A1C below 7 ?Provided education to patient about basic DM disease process. 11-28-2021: Has a good understanding of DM and how to effectively manage.; ?Reviewed medications with patient and discussed importance of medication adherence. 11-28-2021: Is compliant with medications. Denies any new medication needs at this time        ?Reviewed prescribed diet with patient heart healthy/ADA diet. 11-28-2021: Is compliant with heart healthy/ADA diet  ; ?Counseled on importance of regular laboratory monitoring as prescribed. 11-28-2021: Has regular lab work done        ?Discussed plans with patient for ongoing care management follow up and provided patient with direct contact information for care management team;      ?Provided patient with written educational materials related to hypo and hyperglycemia and importance of correct treatment. 11-28-2021: Knows how to effectively treat hypo or hyperglycemia. Knows the sx and sx.       ?Reviewed scheduled/upcoming provider appointments including: 12-18-2021;         ?Advised patient, providing education and rationale, to check cbg before meals and at bedtime, when you have symptoms of low or high blood sugar, and before and after exercise and record        ?call provider for findings outside established parameters;       ?Review of patient status, including review of consultants reports, relevant laboratory and other test results, and medications completed;       ?Screening for signs and symptoms of depression related to chronic disease state;        ?Assessed social determinant of health barriers;       ?Eye  exam on 08-21-2021- no changes in vision, got good report  ?  ?Current URI 10-03-2021  (Status: Goal Met.) Long Term Goal 11-28-2021: Closing this goal. The patient denies any current issues with respiratory problems. States seasonal allergies and sneezing at times but no acute findings noted.  ?Evaluation of current treatment plan related to  Current URI ,   self-management and patient's adherence to plan as established by provider. ?Discussed plans with patient for ongoing care management follow up and provided patient with direct contact information for care management team ?Advised patient to call the office for worsening sx and sx of respiratory infection getting worse. The patient is waiting to see if she is COVID + due to being exposed by her granddaughter. The patient states the last 2 days she had not been drinking like she should but today that she is drinking plenty of liquids. The patient states that she is feeling better. States the medications helped her get some good sleep and she feels rested. ; ?Provided education to patient re: worsening sx and sx of infections, to seek emergent help for increased shortness or breath, monitor for chills, fever or other symptoms warranting provider recommendations. Ask the patient to call the office for questions and concerns ; ?Reviewed medications with patient and discussed compliance with medications; ?Reviewed scheduled/upcoming provider appointments including 11-20-2021 at [place] am; ?Discussed plans with patient for ongoing care management follow up and provided patient with direct contact information for care management team; ?Advised patient to discuss new concerns about URI, or questions with provider; ?  ?Hyperlipidemia:  (Status: Goal on Track (progressing): YES.) Long Term Goal  ?     ?Lab Results  ?Component Value Date  ?  CHOL 122 11/20/2021  ?  HDL 51 11/20/2021  ?  [place] 54 11/20/2021  ?  TRIG 88 11/20/2021  ?  ?  ?Medication review performed; medication list updated in electronic medical record. 11-28-2021: The patient takes Crestor 10 mg QD. The patient is compliant with medications ?Provider established cholesterol goals reviewed. 11-28-2021: The patient is at goal; ?Counseled on importance of regular laboratory monitoring as prescribed. 11-28-2021: The patient has regular lab work ?Provided HLD  educational materials; ?Reviewed role and benefits of statin for ASCVD risk reduction; ?Discussed strategies to manage statin-induced myalgias; ?Reviewed importance of limiting foods high in cholesterol; ?Reviewed exercise goals and target of 150 minutes per week; ?  ?Hypertension: (Status: Goal on Track (progressing): YES.) Long Term Goal  ?Last practice recorded BP readings:  ?   ?BP Readings from Last 3 Encounters:  ?11/20/21 (!) 144/76  ?10/25/21 (!) 126/56  ?10/02/21 132/79  ?Most recent eGFR/CrCl:  ?     ?Lab Results  ?Component Value Date  ?  EGFR 57 (L) 11/20/2021  ?  No components found for: CRCL ?  ?Evaluation of current treatment plan related to hypertension self management and patient's adherence to plan as established by provider. 11-28-2021: The patient denies any issues with HTN or heart health. The patient states that she does have to see hematology in July for some low blood counts but other than that feels she is doing well. She denies any further back pain. Had injections 2 weeks ago and she states that today she has no pain at all;   ?Provided education to patient re: stroke prevention, s/s of heart attack and stroke; ?Reviewed prescribed diet heart healthy/ADA diet. 11-28-2021: No issues or concerns related to dietary restrictions ?Reviewed medications with patient and discussed importance of compliance. 11-28-2021:  The patient is compliant with medications;  ?Counseled on the importance of exercise goals with target of 150 minutes per week ?Discussed plans with patient for ongoing care management follow up and provided patient with direct contact information for care management team; ?Advised patient, providing education and rationale, to monitor blood pressure daily and record, calling PCP for findings outside established parameters;  ?Advised patient to discuss blood pressure changes  with provider; ?Provided education on prescribed diet heart healthy/ADA diet ;  ?Discussed complications of  poorly controlled blood pressure such as heart disease, stroke, circulatory complications, vision complications, kidney impairment, sexual dysfunction;  ?  ?Patient Goals/Self-Care Activities: ?Take medications as prescr

## 2021-12-09 DIAGNOSIS — Z7984 Long term (current) use of oral hypoglycemic drugs: Secondary | ICD-10-CM

## 2021-12-09 DIAGNOSIS — I10 Essential (primary) hypertension: Secondary | ICD-10-CM

## 2021-12-09 DIAGNOSIS — E785 Hyperlipidemia, unspecified: Secondary | ICD-10-CM | POA: Diagnosis not present

## 2021-12-09 DIAGNOSIS — E1159 Type 2 diabetes mellitus with other circulatory complications: Secondary | ICD-10-CM

## 2021-12-18 ENCOUNTER — Ambulatory Visit (INDEPENDENT_AMBULATORY_CARE_PROVIDER_SITE_OTHER): Payer: No Typology Code available for payment source | Admitting: Family Medicine

## 2021-12-18 ENCOUNTER — Encounter: Payer: Self-pay | Admitting: Family Medicine

## 2021-12-18 VITALS — BP 126/56 | HR 50 | Temp 98.3°F | Wt 164.2 lb

## 2021-12-18 DIAGNOSIS — I129 Hypertensive chronic kidney disease with stage 1 through stage 4 chronic kidney disease, or unspecified chronic kidney disease: Secondary | ICD-10-CM

## 2021-12-18 DIAGNOSIS — N1831 Chronic kidney disease, stage 3a: Secondary | ICD-10-CM

## 2021-12-18 DIAGNOSIS — I10 Essential (primary) hypertension: Secondary | ICD-10-CM | POA: Diagnosis not present

## 2021-12-18 NOTE — Progress Notes (Signed)
? ?BP (!) 126/56 (BP Location: Left Arm, Cuff Size: Normal)   Pulse (!) 50   Temp 98.3 ?F (36.8 ?C)   Wt 164 lb 3.2 oz (74.5 kg)   LMP  (LMP Unknown)   SpO2 99%   BMI 31.03 kg/m?   ? ?Subjective:  ? ? Patient ID: Teresa [name], female    DOB: October 27, 1945, 76 y.o.   MRN: [id number] ? ?HPI: ?Teresa [name] is a 76 y.o. female ? ?Chief Complaint  ?Patient presents with  ? Hypertension  ? ?HYPERTENSION ?Hypertension status: better  ?Satisfied with current treatment? yes ?Duration of hypertension: chronic ?BP monitoring frequency:  a few times a week 120s-130s ?BP medication side effects:  no ?Medication compliance: excellent compliance ?Previous BP meds:lisinopril, chlorthalidone ?Aspirin: yes ?Recurrent headaches: no ?Visual changes: no ?Palpitations: no ?Dyspnea: no ?Chest pain: no ?Lower extremity edema: no ?Dizzy/lightheaded: no ? ?Relevant past medical, surgical, family and social history reviewed and updated as indicated. Interim medical history since our last visit reviewed. ?Allergies and medications reviewed and updated. ? ?Review of Systems  ?Constitutional: Negative.   ?Respiratory: Negative.    ?Cardiovascular: Negative.   ?Gastrointestinal: Negative.   ?Musculoskeletal: Negative.   ?Psychiatric/Behavioral: Negative.    ? ?Per HPI unless specifically indicated above ? ?   ?Objective:  ?  ?BP (!) 126/56 (BP Location: Left Arm, Cuff Size: Normal)   Pulse (!) 50   Temp 98.3 ?F (36.8 ?C)   Wt 164 lb 3.2 oz (74.5 kg)   LMP  (LMP Unknown)   SpO2 99%   BMI 31.03 kg/m?   ?Wt Readings from Last 3 Encounters:  ?12/18/21 164 lb 3.2 oz (74.5 kg)  ?11/20/21 164 lb 3.2 oz (74.5 kg)  ?10/25/21 163 lb 6.4 oz (74.1 kg)  ?  ?Physical Exam ?Vitals and nursing note reviewed.  ?Constitutional:   ?   General: She is not in acute distress. ?   Appearance: Normal appearance. She is not ill-appearing, toxic-appearing or diaphoretic.  ?HENT:  ?   Head: Normocephalic and atraumatic.  ?   Right Ear: External ear  normal.  ?   Left Ear: External ear normal.  ?   Nose: Nose normal.  ?   Mouth/Throat:  ?   Mouth: Mucous membranes are moist.  ?   Pharynx: Oropharynx is clear.  ?Eyes:  ?   General: No scleral icterus.    ?   Right eye: No discharge.     ?   Left eye: No discharge.  ?   Extraocular Movements: Extraocular movements intact.  ?   Conjunctiva/sclera: Conjunctivae normal.  ?   Pupils: Pupils are equal, round, and reactive to light.  ?Cardiovascular:  ?   Rate and Rhythm: Normal rate and regular rhythm.  ?   Pulses: Normal pulses.  ?   Heart sounds: Normal heart sounds. No murmur heard. ?  No friction rub. No gallop.  ?Pulmonary:  ?   Effort: Pulmonary effort is normal. No respiratory distress.  ?   Breath sounds: Normal breath sounds. No stridor. No wheezing, rhonchi or rales.  ?Chest:  ?   Chest wall: No tenderness.  ?Musculoskeletal:     ?   General: Normal range of motion.  ?   Cervical back: Normal range of motion and neck supple.  ?Skin: ?   General: Skin is warm and dry.  ?   Capillary Refill: Capillary refill takes less than 2 seconds.  ?   Coloration: Skin is not jaundiced or  pale.  ?   Findings: No bruising, erythema, lesion or rash.  ?Neurological:  ?   General: No focal deficit present.  ?   Mental Status: She is alert and oriented to person, place, and time. Mental status is at baseline.  ?Psychiatric:     ?   Mood and Affect: Mood normal.     ?   Behavior: Behavior normal.     ?   Thought Content: Thought content normal.     ?   Judgment: Judgment normal.  ? ? ?Results for orders placed or performed in visit on 11/20/21  ?CBC with Differential/Platelet  ?Result Value Ref Range  ? WBC 7.1 3.4 - 10.8 x10E3/uL  ? RBC 3.32 (L) 3.77 - 5.28 x10E6/uL  ? Hemoglobin 9.8 (L) 11.1 - 15.9 g/dL  ? Hematocrit 30.7 (L) 34.0 - 46.6 %  ? MCV 93 79 - 97 fL  ? MCH 29.5 26.6 - 33.0 pg  ? MCHC 31.9 31.5 - 35.7 g/dL  ? RDW 12.5 11.7 - 15.4 %  ? Platelets 234 150 - 450 x10E3/uL  ? Neutrophils 62 Not Estab. %  ? Lymphs 29 Not  Estab. %  ? Monocytes 8 Not Estab. %  ? Eos 1 Not Estab. %  ? Basos 0 Not Estab. %  ? Neutrophils Absolute 4.3 1.4 - 7.0 x10E3/uL  ? Lymphocytes Absolute 2.0 0.7 - 3.1 x10E3/uL  ? Monocytes Absolute 0.6 0.1 - 0.9 x10E3/uL  ? EOS (ABSOLUTE) 0.1 0.0 - 0.4 x10E3/uL  ? Basophils Absolute 0.0 0.0 - 0.2 x10E3/uL  ? Immature Granulocytes 0 Not Estab. %  ? Immature Grans (Abs) 0.0 0.0 - 0.1 x10E3/uL  ?Comprehensive metabolic panel  ?Result Value Ref Range  ? Glucose 122 (H) 70 - 99 mg/dL  ? BUN 21 8 - 27 mg/dL  ? Creatinine, Ser 1.02 (H) 0.57 - 1.00 mg/dL  ? eGFR 57 (L) >59 mL/min/1.73  ? BUN/Creatinine Ratio 21 12 - 28  ? Sodium 146 (H) 134 - 144 mmol/L  ? Potassium 4.5 3.5 - 5.2 mmol/L  ? Chloride 108 (H) 96 - 106 mmol/L  ? CO2 22 20 - 29 mmol/L  ? Calcium 9.9 8.7 - 10.3 mg/dL  ? Total Protein 6.8 6.0 - 8.5 g/dL  ? Albumin 4.4 3.7 - 4.7 g/dL  ? Globulin, Total 2.4 1.5 - 4.5 g/dL  ? Albumin/Globulin Ratio 1.8 1.2 - 2.2  ? Bilirubin Total <0.2 0.0 - 1.2 mg/dL  ? Alkaline Phosphatase 75 44 - 121 IU/L  ? AST 17 0 - 40 IU/L  ? ALT 13 0 - 32 IU/L  ?Lipid Panel w/o Chol/HDL Ratio  ?Result Value Ref Range  ? Cholesterol, Total 122 100 - 199 mg/dL  ? Triglycerides 88 0 - 149 mg/dL  ? HDL 51 >39 mg/dL  ? VLDL Cholesterol Cal 17 5 - 40 mg/dL  ? LDL Chol Calc (NIH) 54 0 - 99 mg/dL  ?TSH  ?Result Value Ref Range  ? TSH 1.440 0.450 - 4.500 uIU/mL  ?B12  ?Result Value Ref Range  ? Vitamin B-12 749 232 - 1,245 pg/mL  ?Bayer DCA Hb A1c Waived  ?Result Value Ref Range  ? HB A1C (BAYER DCA - WAIVED) 6.6 (H) 4.8 - 5.6 %  ? ?   ?Assessment & Plan:  ? ?Problem List Items Addressed This Visit   ? ?  ? Genitourinary  ? Hypertensive CKD (chronic kidney disease) - Primary  ?  Under good control on current regimen. Continue  current regimen. Continue to monitor. Call with any concerns. Refills given. Labs drawn today. ? ? ?  ?  ? Relevant Orders  ? Basic metabolic panel  ?  ? ?Follow up plan: ?Return in about 5 months (around 05/20/2022), or after  10/11 for physical. ? ? ? ? ? ?

## 2021-12-18 NOTE — Assessment & Plan Note (Signed)
Under good control on current regimen. Continue current regimen. Continue to monitor. Call with any concerns. Refills given. Labs drawn today.   

## 2021-12-19 ENCOUNTER — Other Ambulatory Visit: Payer: Self-pay | Admitting: Family Medicine

## 2021-12-19 DIAGNOSIS — E875 Hyperkalemia: Secondary | ICD-10-CM

## 2021-12-19 LAB — BASIC METABOLIC PANEL
BUN/Creatinine Ratio: 30 — ABNORMAL HIGH (ref 12–28)
BUN: 37 mg/dL — ABNORMAL HIGH (ref 8–27)
CO2: 21 mmol/L (ref 20–29)
Calcium: 9.7 mg/dL (ref 8.7–10.3)
Chloride: 110 mmol/L — ABNORMAL HIGH (ref 96–106)
Creatinine, Ser: 1.23 mg/dL — ABNORMAL HIGH (ref 0.57–1.00)
Glucose: 106 mg/dL — ABNORMAL HIGH (ref 70–99)
Potassium: 5.3 mmol/L — ABNORMAL HIGH (ref 3.5–5.2)
Sodium: 144 mmol/L (ref 134–144)
eGFR: 46 mL/min/{1.73_m2} — ABNORMAL LOW (ref >59)

## 2021-12-25 ENCOUNTER — Other Ambulatory Visit: Payer: No Typology Code available for payment source

## 2021-12-25 DIAGNOSIS — E875 Hyperkalemia: Secondary | ICD-10-CM

## 2021-12-26 LAB — BASIC METABOLIC PANEL
BUN/Creatinine Ratio: 24 (ref 12–28)
BUN: 27 mg/dL (ref 8–27)
CO2: 21 mmol/L (ref 20–29)
Calcium: 9.7 mg/dL (ref 8.7–10.3)
Chloride: 108 mmol/L — ABNORMAL HIGH (ref 96–106)
Creatinine, Ser: 1.14 mg/dL — ABNORMAL HIGH (ref 0.57–1.00)
Glucose: 106 mg/dL — ABNORMAL HIGH (ref 70–99)
Potassium: 5.1 mmol/L (ref 3.5–5.2)
Sodium: 144 mmol/L (ref 134–144)
eGFR: 50 mL/min/{1.73_m2} — ABNORMAL LOW (ref >59)

## 2021-12-27 ENCOUNTER — Other Ambulatory Visit: Payer: Self-pay

## 2021-12-27 MED ORDER — LEVOTHYROXINE SODIUM 50 MCG PO TABS
50.0000 ug | ORAL_TABLET | Freq: Every day | ORAL | 3 refills | Status: DC
Start: 2021-12-27 — End: 2022-12-05

## 2021-12-27 NOTE — Telephone Encounter (Signed)
Rx refilled 4/25

## 2022-01-14 IMAGING — MG MM DIGITAL SCREENING BILAT W/ TOMO AND CAD
8 series · 8 of 24 positions shown · non-contrast
Comparison: Previous exam(s).

CLINICAL DATA: Screening.

EXAM:
DIGITAL SCREENING BILATERAL MAMMOGRAM WITH TOMOSYNTHESIS AND CAD
TECHNIQUE: Bilateral screening digital craniocaudal and mediolateral oblique
mammograms were obtained. Bilateral screening digital breast
tomosynthesis was performed. The images were evaluated with
computer-aided detection.

[R CC synth-2D]
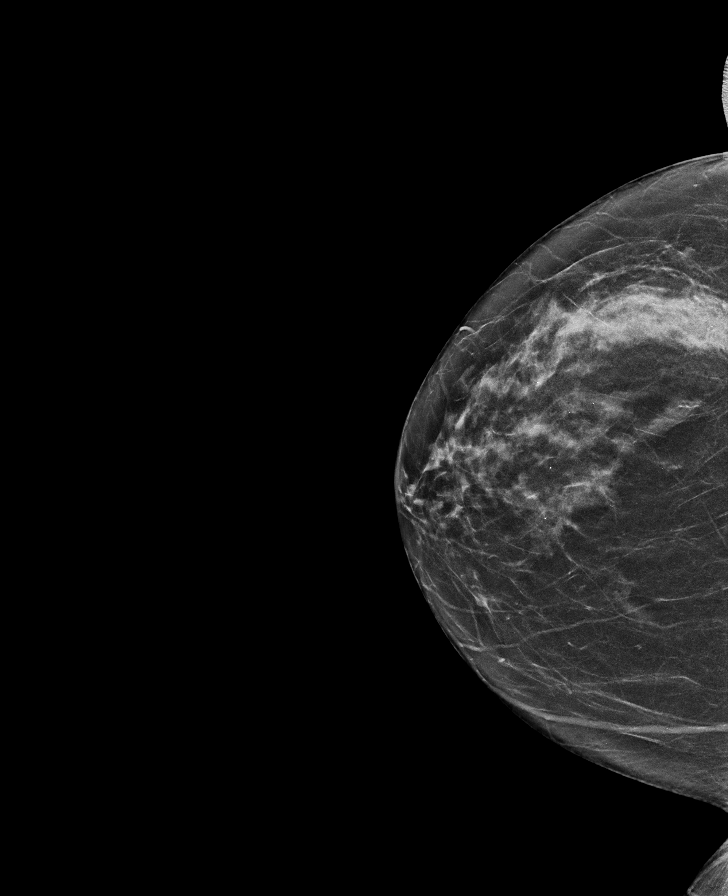

[R MLO synth-2D]
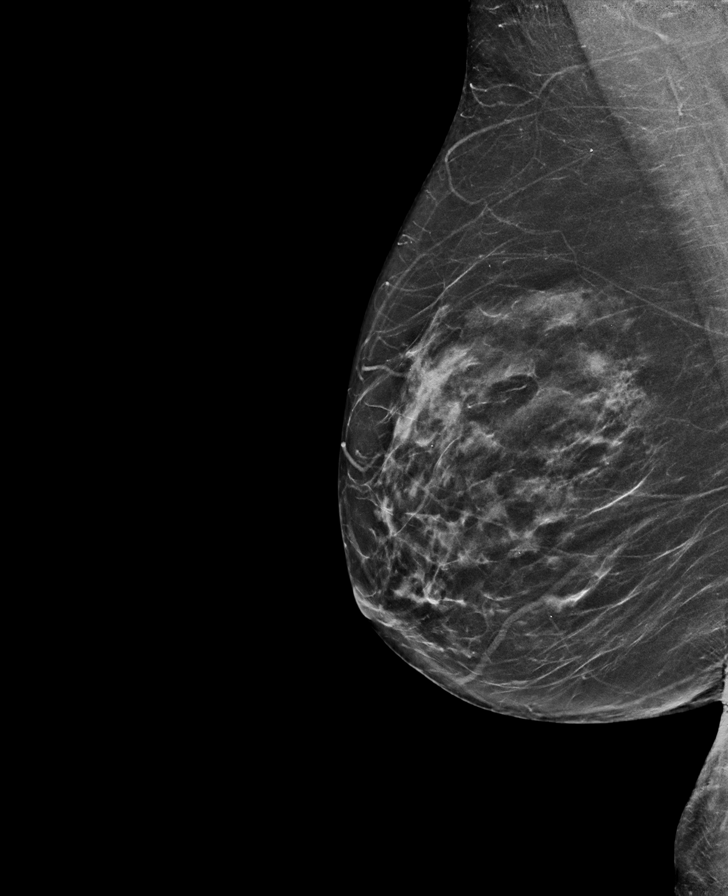

[L CC synth-2D]
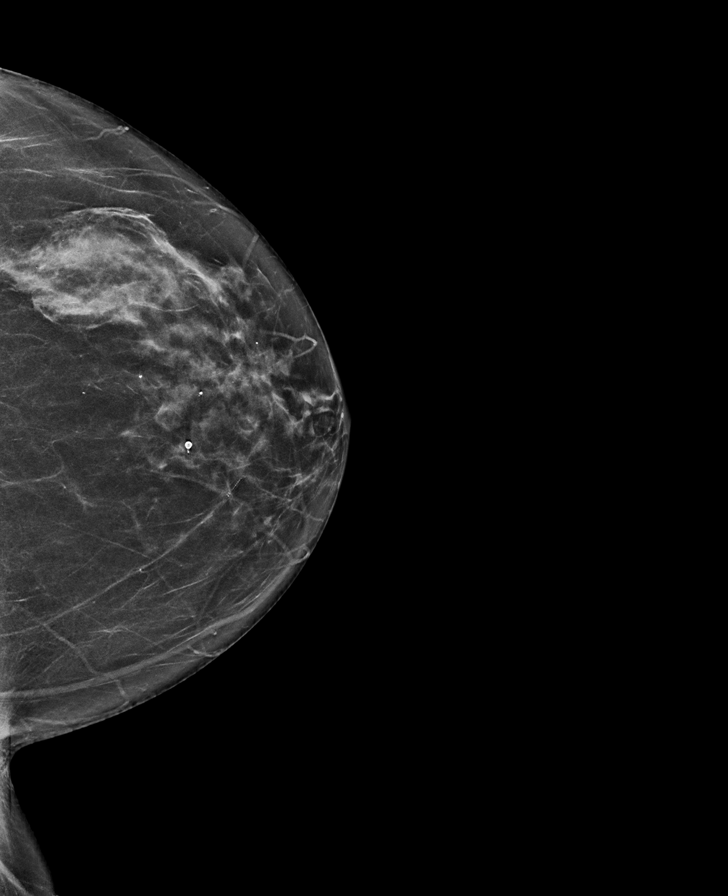

[L MLO synth-2D]
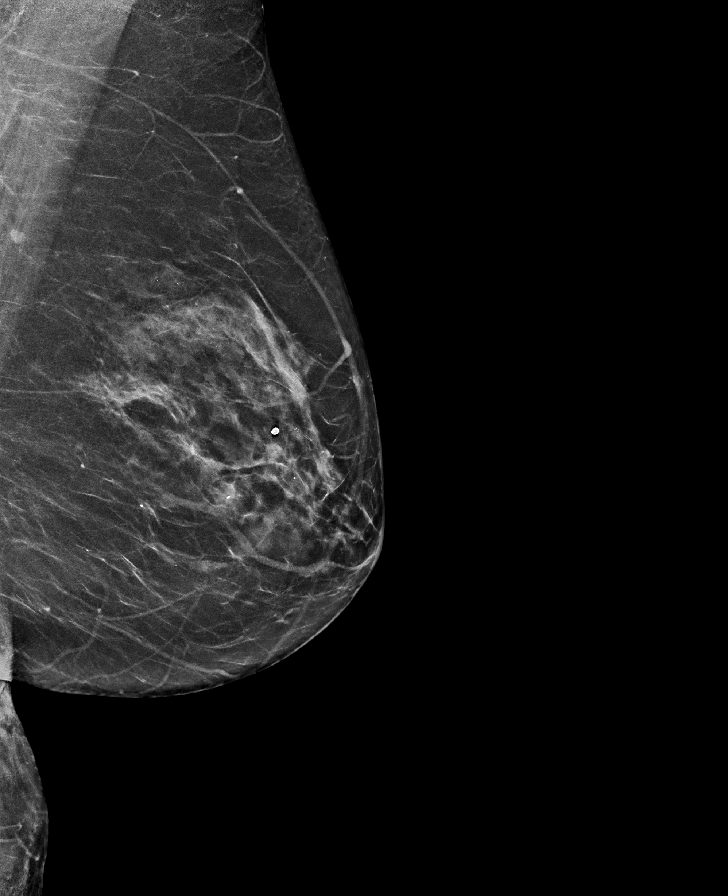

[R MLO tomo · tomo slice 33/64.0]
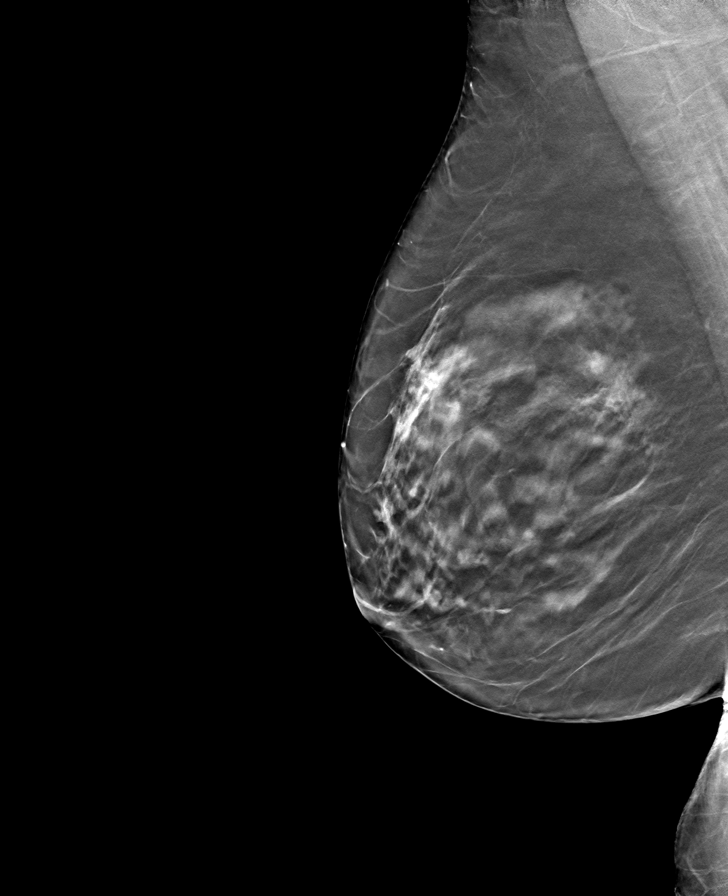

[L MLO tomo · tomo slice 33/65.0]
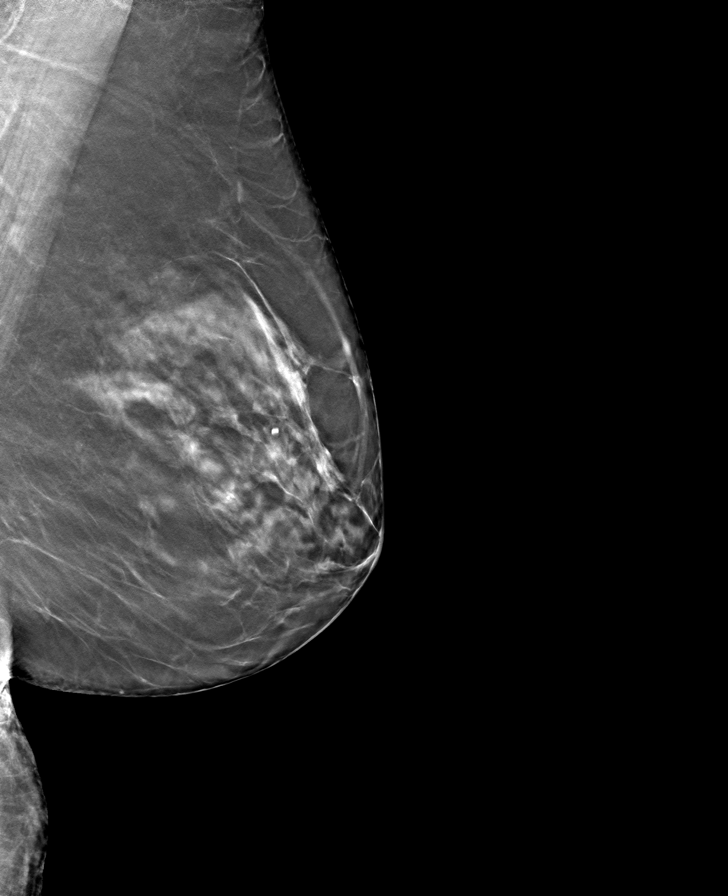

[R CC tomo · tomo slice 31/60.0]
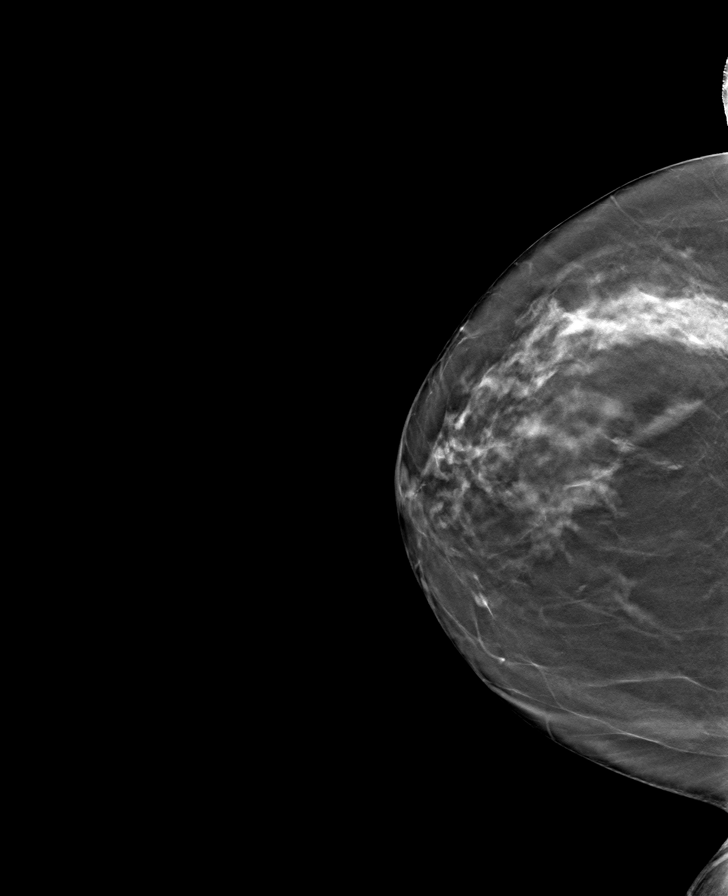

[L CC tomo · tomo slice 31/61.0]
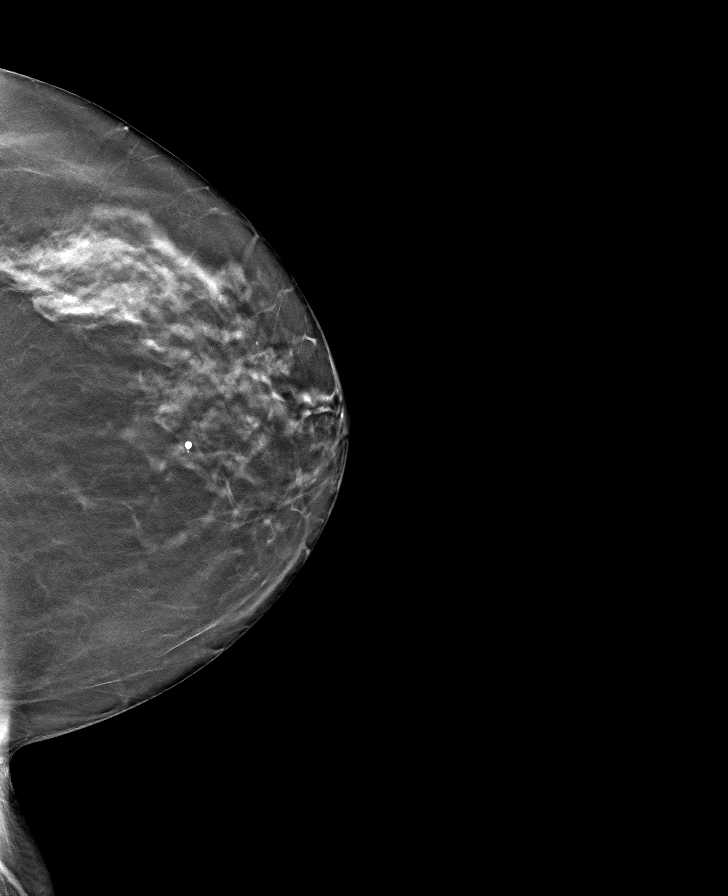

[8 of 24 positions shown; findings below may reference images not displayed]

ACR Breast Density Category c: The breast tissue is heterogeneously
dense, which may obscure small masses.
FINDINGS: There are no findings suspicious for malignancy.
IMPRESSION: No mammographic evidence of malignancy. A result letter of this
screening mammogram will be mailed directly to the patient.

RECOMMENDATION:
Screening mammogram in one year. (Code:Q3-W-BC3)

BI-RADS CATEGORY  1: Negative.

## 2022-01-30 ENCOUNTER — Ambulatory Visit (INDEPENDENT_AMBULATORY_CARE_PROVIDER_SITE_OTHER): Payer: No Typology Code available for payment source

## 2022-01-30 ENCOUNTER — Telehealth: Payer: No Typology Code available for payment source

## 2022-01-30 DIAGNOSIS — E1122 Type 2 diabetes mellitus with diabetic chronic kidney disease: Secondary | ICD-10-CM

## 2022-01-30 DIAGNOSIS — I10 Essential (primary) hypertension: Secondary | ICD-10-CM

## 2022-01-30 DIAGNOSIS — E782 Mixed hyperlipidemia: Secondary | ICD-10-CM

## 2022-01-30 NOTE — Patient Instructions (Signed)
Visit Information  Thank you for taking time to visit with me today. Please don't hesitate to contact me if I can be of assistance to you before our next scheduled telephone appointment.  Following are the goals we discussed today:  Diabetes:  (Status: Goal Met.) Long Term Goal  01-30-2022: Goals met and care plan is being closed         Lab Results  Component Value Date    HGBA1C 6.6 (H) 11/20/2021  Assessed patient's understanding of A1c goal: <7%> 01-30-2022: The patient is at goal and knows the goal of keeping A1C below 7 Provided education to patient about basic DM disease process. 01-30-2022: Has a good understanding of DM and how to effectively manage.; Reviewed medications with patient and discussed importance of medication adherence. 01-30-2022: Is compliant with medications. Denies any new medication needs at this time        Reviewed prescribed diet with patient heart healthy/ADA diet. 01-30-2022: Is compliant with heart healthy/ADA diet. Discussed trying "Body armor" Lite drinks that are good for hydration  ; Counseled on importance of regular laboratory monitoring as prescribed. 01-30-2022: Has regular lab work done        Discussed plans with patient for ongoing care management follow up and provided patient with direct contact information for care management team;      Provided patient with written educational materials related to hypo and hyperglycemia and importance of correct treatment. 01-28-2022: Knows how to effectively treat hypo or hyperglycemia. Knows the sx and sx.       Reviewed scheduled/upcoming provider appointments including: 05-24-2022;         Advised patient, providing education and rationale, to check cbg before meals and at bedtime, when you have symptoms of low or high blood sugar, and before and after exercise and record        call provider for findings outside established parameters;       Review of patient status, including review of consultants reports, relevant  laboratory and other test results, and medications completed;       Screening for signs and symptoms of depression related to chronic disease state;        Assessed social determinant of health barriers;       Eye exam on 08-21-2021- no changes in vision, got good report    Current URI 10-03-2021  (Status: Goal Met.) Long Term Goal 11-28-2021: Closing this goal. The patient denies any current issues with respiratory problems. States seasonal allergies and sneezing at times but no acute findings noted.  Evaluation of current treatment plan related to  Current URI ,  self-management and patient's adherence to plan as established by provider. Discussed plans with patient for ongoing care management follow up and provided patient with direct contact information for care management team Advised patient to call the office for worsening sx and sx of respiratory infection getting worse. The patient is waiting to see if she is COVID + due to being exposed by her granddaughter. The patient states the last 2 days she had not been drinking like she should but today that she is drinking plenty of liquids. The patient states that she is feeling better. States the medications helped her get some good sleep and she feels rested. ; Provided education to patient re: worsening sx and sx of infections, to seek emergent help for increased shortness or breath, monitor for chills, fever or other symptoms warranting provider recommendations. Ask the patient to call the office for questions  and concerns ; Reviewed medications with patient and discussed compliance with medications; Reviewed scheduled/upcoming provider appointments including 11-20-2021 at [place] am; Discussed plans with patient for ongoing care management follow up and provided patient with direct contact information for care management team; Advised patient to discuss new concerns about URI, or questions with provider;   Hyperlipidemia:  (Status: Goal Met.) Long  Term Goal 01-30-2022: Goals met and care plan is being closed       Lab Results  Component Value Date    CHOL 122 11/20/2021    HDL 51 11/20/2021    LDLCALC 54 11/20/2021    TRIG 88 11/20/2021      Medication review performed; medication list updated in electronic medical record. 01-30-2022: The patient takes Crestor 10 mg QD. The patient is compliant with medications Provider established cholesterol goals reviewed. 01-30-2022: The patient is at goal; Counseled on importance of regular laboratory monitoring as prescribed. 01-30-2022: The patient has regular lab work Provided HLD educational materials; Reviewed role and benefits of statin for ASCVD risk reduction; Discussed strategies to manage statin-induced myalgias; Reviewed importance of limiting foods high in cholesterol; Reviewed exercise goals and target of 150 minutes per week;   Hypertension: (Status: Goal Met.) Long Term Goal  01-30-2022: Goals met and care plan is being closed  Last practice recorded BP readings:     BP Readings from Last 3 Encounters:  12/18/21 (!) 126/56  11/20/21 (!) 144/76  10/25/21 (!) 126/56  Most recent eGFR/CrCl:       Lab Results  Component Value Date    EGFR 50 (L) 12/25/2021    No components found for: CRCL   Evaluation of current treatment plan related to hypertension self management and patient's adherence to plan as established by provider. 11-28-2021: The patient denies any issues with HTN or heart health. The patient states that she does have to see hematology in July for some low blood counts but other than that feels she is doing well. She denies any further back pain. Had injections 2 weeks ago and she states that today she has no pain at all. 01-30-2022: The patient blood pressures are more stable. The patient denies any issues with HTN or heart health;   Provided education to patient re: stroke prevention, s/s of heart attack and stroke; Reviewed prescribed diet heart healthy/ADA diet.  01-30-2022: No issues or concerns related to dietary restrictions Reviewed medications with patient and discussed importance of compliance. 01-30-2022: The patient is compliant with medications. Tolerating the changes in medications without any big drops in blood pressures;  Counseled on the importance of exercise goals with target of 150 minutes per week Discussed plans with patient for ongoing care management follow up and provided patient with direct contact information for care management team; Advised patient, providing education and rationale, to monitor blood pressure daily and record, calling PCP for findings outside established parameters;  Advised patient to discuss blood pressure changes  with provider; Provided education on prescribed diet heart healthy/ADA diet ;  Discussed complications of poorly controlled blood pressure such as heart disease, stroke, circulatory complications, vision complications, kidney impairment, sexual dysfunction;    Patient Goals/Self-Care Activities: Take medications as prescribed   Attend all scheduled provider appointments Call pharmacy for medication refills 3-7 days in advance of running out of medications Attend church or other social activities Perform all self care activities independently  Perform IADL's (shopping, preparing meals, housekeeping, managing finances) independently Call provider office for new concerns or questions  Work with  the social worker to address care coordination needs and will continue to work with the clinical team to address health care and disease management related needs call the Suicide and Crisis Lifeline: 988 call the [place] National Suicide Prevention Lifeline: 717-155-9616 or TTY: 916-877-9217 TTY (774)414-6206) to talk to a trained counselor call 1-800-273-TALK (toll free, 24 hour hotline) if experiencing a Mental Health or [place]  keep appointment with eye doctor- Had vision check in January of  2023 check blood sugar at prescribed times: before meals and at bedtime, when you have symptoms of low or high blood sugar, and before and after exercise check feet daily for cuts, sores or redness enter blood sugar readings and medication or insulin into daily log take the blood sugar log to all doctor visits trim toenails straight across drink 6 to 8 glasses of water each day eat fish at least once per week fill half of plate with vegetables join a weight loss program limit fast food meals to no more than 1 per week manage portion size prepare main meal at home 3 to 5 days each week read food labels for fat, fiber, carbohydrates and portion size reduce red meat to 2 to 3 times a week keep feet up while sitting wash and dry feet carefully every day wear comfortable, cotton socks wear comfortable, well-fitting shoes check blood pressure weekly choose a place to take my blood pressure (home, clinic or office, retail store) write blood pressure results in a log or diary learn about high blood pressure keep a blood pressure log take blood pressure log to all doctor appointments call doctor for signs and symptoms of high blood pressure develop an action plan for high blood pressure keep all doctor appointments take medications for blood pressure exactly as prescribed report new symptoms to your doctor eat more whole grains, fruits and vegetables, lean meats and healthy fats - call for medicine refill 2 or 3 days before it runs out - take all medications exactly as prescribed - call doctor with any symptoms you believe are related to your medicine - call doctor when you experience any new symptoms - go to all doctor appointments as scheduled - adhere to prescribed diet: heart healthy/ADA    No further follow up needed  Please call the care guide team at [phone] if you need to cancel or reschedule your appointment.   If you are experiencing a Mental Health or [place] or need someone to talk to, please call the Suicide and Crisis Lifeline: 988 call the [place] National Suicide Prevention Lifeline: (770)323-1157 or TTY: 562-376-7727 TTY 916-446-3345) to talk to a trained counselor call 1-800-273-TALK (toll free, 24 hour hotline)   Patient verbalizes understanding of instructions and care plan provided today and agrees to view in [place]. Active MyChart status and patient understanding of how to access instructions and care plan via MyChart confirmed with patient.     Noreene Larsson RN, MSN, [facility] Mobile: [phone]

## 2022-01-30 NOTE — Chronic Care Management (AMB) (Signed)
Chronic Care Management   CCM RN Visit Note  01/30/2022 Name: Teresa [name] MRN: [id number] DOB: March 04, 1946  Subjective: Teresa [name] is a 76 y.o. year old female who is a primary care patient of Felicidad, Sugarman, DO. The care management team was consulted for assistance with disease management and care coordination needs.    Engaged with patient by telephone for follow up visit in response to provider referral for case management and/or care coordination services.   Consent to Services:  The patient was given information about Chronic Care Management services, agreed to services, and gave verbal consent prior to initiation of services.  Please see initial visit note for detailed documentation.   Patient agreed to services and verbal consent obtained.   Assessment: Review of patient past medical history, allergies, medications, health status, including review of consultants reports, laboratory and other test data, was performed as part of comprehensive evaluation and provision of chronic care management services.   SDOH (Social Determinants of Health) assessments and interventions performed:    CCM Care Plan  Allergies  Allergen Reactions   Amlodipine Other (See Comments)    Ankle swelling   Atorvastatin     cramps   Evista [Raloxifene] Nausea Only    Outpatient Encounter Medications as of 01/30/2022  Medication Sig   ASPIRIN 81 PO Take by mouth daily.   blood glucose meter kit and supplies Dispense based on patient and insurance preference. Use up to four times daily as directed. (FOR ICD-9 250.00, 250.01).   Blood Glucose Monitoring Suppl (ONETOUCH VERIO) w/Device KIT 1 each by Does not apply route daily.   chlorthalidone (HYGROTON) 25 MG tablet Take 1 tablet (25 mg total) by mouth daily.   clobetasol ointment (TEMOVATE) 0.05 % APPLY  OINTMENT TOPICALLY TWICE DAILY   clopidogrel (PLAVIX) 75 MG tablet Take 1 tablet (75 mg total) by mouth daily.   folic acid (FOLVITE)  224 MCG tablet Take 400 mcg by mouth daily.   glucose blood (ONETOUCH VERIO) test strip Use as instructed   glucose blood test strip 1 each by Other route 2 (two) times daily. Use as instructed, Dx: E11.22   levothyroxine (SYNTHROID) 50 MCG tablet Take 1 tablet (50 mcg total) by mouth daily.   lisinopril (ZESTRIL) 40 MG tablet Take 1.5 tablets (60 mg total) by mouth daily.   metFORMIN (GLUCOPHAGE-XR) 500 MG 24 hr tablet Take 2 tablets (1,000 mg total) by mouth 2 (two) times daily.   OneTouch Delica Lancets 82N MISC 1 each by Does not apply route daily.   rosuvastatin (CRESTOR) 10 MG tablet Take 1 tablet (10 mg total) by mouth daily.   vitamin B-12 (CYANOCOBALAMIN) 1000 MCG tablet Take 1,000 mcg by mouth daily.   No facility-administered encounter medications on file as of 01/30/2022.    Patient Active Problem List   Diagnosis Date Noted   Pulmonary HTN ([place]) 05/22/2021   Senile purpura ([place]) 05/22/2021   History of colonic polyps 05/18/2020   Bradycardia 02/11/2018   Lumbar radiculopathy 09/27/2017   Advanced care planning/counseling discussion 01/22/2017   Type 2 diabetes mellitus with stage 3 chronic kidney disease, without long-term current use of insulin ([place]) 07/04/2015   CVA (cerebral infarction) 04/03/2015   Obesity 00/37/0488   Lichen sclerosus 89/16/9450   Hypothyroidism 04/03/2015   Osteopenia 04/03/2015   Hyperlipidemia 04/03/2015   B12 deficiency 04/03/2015   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   CKD (chronic kidney disease), stage III ([place]) 04/03/2015   Anemia 03/10/2014  Conditions to be addressed/monitored:HTN, HLD, and DMII  Care Plan : RNCM: General Plan of Care (Adult) for Chronic Disease Management and Care Coordination Needs  Updates made by Vanita Ingles, RN since 01/30/2022 12:00 AM  Completed 01/30/2022   Problem: RNCM: Development of Plan of Care for Chronic Disease Management (DM, HTN, HLD, and URI) Resolved 01/30/2022  Priority: High      Long-Range Goal: RNCM: Effective management of plan of care for Chronic Disease Management (HTN, HLD, DM, URI) Completed 01/30/2022  Start Date: 10/03/2021  Expected End Date: 10/03/2022  Priority: High  Note:   Current Barriers: 01-30-2022: Goals met and care plan is being closed. Patient has the [facility] number to call for new changes or concerns Knowledge Deficits related to plan of care for management of HTN, HLD, DMII, and Current URI  Chronic Disease Management support and education needs related to HTN, HLD, DMII, and Current URI  RNCM Clinical Goal(s):  Patient will verbalize basic understanding of HTN, HLD, DMII, and Current URI disease process and self health management plan as evidenced by keeping appointments, calling the office for changes in conditions, questions, or concerns, and working with the CCM team to optimize health and well being demonstrate understanding of rationale for each prescribed medication as evidenced by compliance with medications and calling for medications refills before running out of medications    attend all scheduled medical appointments with pcp and specialist, as evidenced by keeping appointments and calling for schedule change needs        demonstrate improved and ongoing health management independence as evidenced by stable conditions, lab work on a regular basis, routine office visits, calling the office for changes or questions.         demonstrate ongoing self health care management ability for effective management of chronic conditions as evidenced by working with the CCM team through collaboration with Consulting civil engineer, provider, and care team.   Interventions: 1:1 collaboration with primary care provider regarding development and update of comprehensive plan of care as evidenced by provider attestation and co-signature Inter-disciplinary care team collaboration (see longitudinal plan of care) Evaluation of current treatment plan related to  self  management and patient's adherence to plan as established by provider   Diabetes:  (Status: Goal Met.) Long Term Goal  01-30-2022: Goals met and care plan is being closed   Lab Results  Component Value Date   HGBA1C 6.6 (H) 11/20/2021  Assessed patient's understanding of A1c goal: <7%> 01-30-2022: The patient is at goal and knows the goal of keeping A1C below 7 Provided education to patient about basic DM disease process. 01-30-2022: Has a good understanding of DM and how to effectively manage.; Reviewed medications with patient and discussed importance of medication adherence. 01-30-2022: Is compliant with medications. Denies any new medication needs at this time        Reviewed prescribed diet with patient heart healthy/ADA diet. 01-30-2022: Is compliant with heart healthy/ADA diet. Discussed trying "Body armor" Lite drinks that are good for hydration  ; Counseled on importance of regular laboratory monitoring as prescribed. 01-30-2022: Has regular lab work done        Discussed plans with patient for ongoing care management follow up and provided patient with direct contact information for care management team;      Provided patient with written educational materials related to hypo and hyperglycemia and importance of correct treatment. 01-28-2022: Knows how to effectively treat hypo or hyperglycemia. Knows the sx and sx.  Reviewed scheduled/upcoming provider appointments including: 05-24-2022;         Advised patient, providing education and rationale, to check cbg before meals and at bedtime, when you have symptoms of low or high blood sugar, and before and after exercise and record        call provider for findings outside established parameters;       Review of patient status, including review of consultants reports, relevant laboratory and other test results, and medications completed;       Screening for signs and symptoms of depression related to chronic disease state;        Assessed  social determinant of health barriers;       Eye exam on 08-21-2021- no changes in vision, got good report   Current URI 10-03-2021  (Status: Goal Met.) Long Term Goal 11-28-2021: Closing this goal. The patient denies any current issues with respiratory problems. States seasonal allergies and sneezing at times but no acute findings noted.  Evaluation of current treatment plan related to  Current URI ,  self-management and patient's adherence to plan as established by provider. Discussed plans with patient for ongoing care management follow up and provided patient with direct contact information for care management team Advised patient to call the office for worsening sx and sx of respiratory infection getting worse. The patient is waiting to see if she is COVID + due to being exposed by her granddaughter. The patient states the last 2 days she had not been drinking like she should but today that she is drinking plenty of liquids. The patient states that she is feeling better. States the medications helped her get some good sleep and she feels rested. ; Provided education to patient re: worsening sx and sx of infections, to seek emergent help for increased shortness or breath, monitor for chills, fever or other symptoms warranting provider recommendations. Ask the patient to call the office for questions and concerns ; Reviewed medications with patient and discussed compliance with medications; Reviewed scheduled/upcoming provider appointments including 11-20-2021 at [place] [facility]; Discussed plans with patient for ongoing care management follow up and provided patient with direct contact information for care management team; Advised patient to discuss new concerns about URI, or questions with provider;  Hyperlipidemia:  (Status: Goal Met.) Long Term Goal 01-30-2022: Goals met and care plan is being closed  Lab Results  Component Value Date   CHOL 122 11/20/2021   HDL 51 11/20/2021   LDLCALC 54 11/20/2021    TRIG 88 11/20/2021     Medication review performed; medication list updated in electronic medical record. 01-30-2022: The patient takes Crestor 10 mg QD. The patient is compliant with medications Provider established cholesterol goals reviewed. 01-30-2022: The patient is at goal; Counseled on importance of regular laboratory monitoring as prescribed. 01-30-2022: The patient has regular lab work Provided HLD educational materials; Reviewed role and benefits of statin for ASCVD risk reduction; Discussed strategies to manage statin-induced myalgias; Reviewed importance of limiting foods high in cholesterol; Reviewed exercise goals and target of 150 minutes per week;  Hypertension: (Status: Goal Met.) Long Term Goal  01-30-2022: Goals met and care plan is being closed  Last practice recorded BP readings:  BP Readings from Last 3 Encounters:  12/18/21 (!) 126/56  11/20/21 (!) 144/76  10/25/21 (!) 126/56  Most recent eGFR/CrCl:  Lab Results  Component Value Date   EGFR 50 (L) 12/25/2021    No components found for: CRCL  Evaluation of current treatment plan related  to hypertension self management and patient's adherence to plan as established by provider. 11-28-2021: The patient denies any issues with HTN or heart health. The patient states that she does have to see hematology in July for some low blood counts but other than that feels she is doing well. She denies any further back pain. Had injections 2 weeks ago and she states that today she has no pain at all. 01-30-2022: The patient blood pressures are more stable. The patient denies any issues with HTN or heart health;   Provided education to patient re: stroke prevention, s/s of heart attack and stroke; Reviewed prescribed diet heart healthy/ADA diet. 01-30-2022: No issues or concerns related to dietary restrictions Reviewed medications with patient and discussed importance of compliance. 01-30-2022: The patient is compliant with medications.  Tolerating the changes in medications without any big drops in blood pressures;  Counseled on the importance of exercise goals with target of 150 minutes per week Discussed plans with patient for ongoing care management follow up and provided patient with direct contact information for care management team; Advised patient, providing education and rationale, to monitor blood pressure daily and record, calling PCP for findings outside established parameters;  Advised patient to discuss blood pressure changes  with provider; Provided education on prescribed diet heart healthy/ADA diet ;  Discussed complications of poorly controlled blood pressure such as heart disease, stroke, circulatory complications, vision complications, kidney impairment, sexual dysfunction;   Patient Goals/Self-Care Activities: Take medications as prescribed   Attend all scheduled provider appointments Call pharmacy for medication refills 3-7 days in advance of running out of medications Attend church or other social activities Perform all self care activities independently  Perform IADL's (shopping, preparing meals, housekeeping, managing finances) independently Call provider office for new concerns or questions  Work with the social worker to address care coordination needs and will continue to work with the clinical team to address health care and disease management related needs call the Suicide and Crisis Lifeline: 988 call the [place] National Suicide Prevention Lifeline: 747 779 8827 or TTY: 7377388694 TTY 727-440-1251) to talk to a trained counselor call 1-800-273-TALK (toll free, 24 hour hotline) if experiencing a Mental Health or [place]  keep appointment with eye doctor- Had vision check in January of 2023 check blood sugar at prescribed times: before meals and at bedtime, when you have symptoms of low or high blood sugar, and before and after exercise check feet daily for cuts, sores or  redness enter blood sugar readings and medication or insulin into daily log take the blood sugar log to all doctor visits trim toenails straight across drink 6 to 8 glasses of water each day eat fish at least once per week fill half of plate with vegetables join a weight loss program limit fast food meals to no more than 1 per week manage portion size prepare main meal at home 3 to 5 days each week read food labels for fat, fiber, carbohydrates and portion size reduce red meat to 2 to 3 times a week keep feet up while sitting wash and dry feet carefully every day wear comfortable, cotton socks wear comfortable, well-fitting shoes check blood pressure weekly choose a place to take my blood pressure (home, clinic or office, retail store) write blood pressure results in a log or diary learn about high blood pressure keep a blood pressure log take blood pressure log to all doctor appointments call doctor for signs and symptoms of high blood pressure develop an action plan  for high blood pressure keep all doctor appointments take medications for blood pressure exactly as prescribed report new symptoms to your doctor eat more whole grains, fruits and vegetables, lean meats and healthy fats - call for medicine refill 2 or 3 days before it runs out - take all medications exactly as prescribed - call doctor with any symptoms you believe are related to your medicine - call doctor when you experience any new symptoms - go to all doctor appointments as scheduled - adhere to prescribed diet: heart healthy/ADA       Plan:No further follow up required: The patient has met the goals of care and care plan has been closed. The patient knows to call the [facility] for new concerns or needs. Has RNCM number for new concerns or needs.  Noreene Larsson RN, MSN, [facility] Mobile: [phone]

## 2022-02-08 ENCOUNTER — Other Ambulatory Visit: Payer: Self-pay | Admitting: Family Medicine

## 2022-02-08 DIAGNOSIS — I10 Essential (primary) hypertension: Secondary | ICD-10-CM | POA: Diagnosis not present

## 2022-02-08 DIAGNOSIS — E1159 Type 2 diabetes mellitus with other circulatory complications: Secondary | ICD-10-CM | POA: Diagnosis not present

## 2022-02-08 DIAGNOSIS — E785 Hyperlipidemia, unspecified: Secondary | ICD-10-CM | POA: Diagnosis not present

## 2022-02-08 DIAGNOSIS — Z7984 Long term (current) use of oral hypoglycemic drugs: Secondary | ICD-10-CM

## 2022-02-08 NOTE — Telephone Encounter (Signed)
Automated request, she received a kit 4 months ago. Requested Prescriptions  Pending Prescriptions Disp Refills  . Blood Glucose Monitoring Suppl (ONETOUCH VERIO REFLECT) w/Device KIT [Pharmacy Med Name: ONE TCH REFL KIT VERIO] 1 kit 0    Sig: USE DAILY.     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 ([place])   [place], Megan P, DO   2 months ago Type 2 diabetes mellitus with stage 3a chronic kidney disease, without long-term current use of insulin ([place])   [place], Megan P, DO   3 months ago Lumbar radiculopathy   [place], [place] T, NP   4 months ago Upper respiratory tract infection, unspecified type   [employer], Megan P, DO   8 months ago Routine general medical examination at a health care facility   [place], Barb Merino, DO      Future Appointments            In 3 weeks Sindy Guadeloupe, MD [facility] [place]   In 1 month  [facility], [place]   In 3 months Wynetta Emery, Barb Merino, DO [employer]

## 2022-02-28 ENCOUNTER — Ambulatory Visit: Payer: Self-pay | Admitting: *Deleted

## 2022-02-28 NOTE — Telephone Encounter (Signed)
Reason for Disposition  [2] Systolic BP  >= 831 OR Diastolic >= 80 AND [5] taking BP medications  Answer Assessment - Initial Assessment Questions 1. BLOOD PRESSURE: "What is the blood pressure?" "Did you take at least two measurements 5 minutes apart?"    This morning 174/72 Had before medicine. Pulse 47.   My pulse stays low.  2 hours  156/74 10:30 AM.     2. ONSET: "When did you take your blood pressure?" 15 minutes ago 151/72.   177/87/  pulse 51.      I had a tooth pulled a week ago and ended up with a dry socket.   I'm in a lot of pain that may be driving up my BP. 3. HOW: "How did you take your blood pressure?" (e.g., automatic home BP monitor, visiting nurse)     Wrist monitor  4. HISTORY: "Do you have a history of high blood pressure?"     Been in 140's and she added 1/2 pill but it's not helping. Today I took an extra half a pill today and it's still high. 5. MEDICINES: "Are you taking any medicines for blood pressure?" "Have you missed any doses recently?"     Yes   I've not missed any doses.   This morning I took an extra half a pill since my BP was elevated.   6. OTHER SYMPTOMS: "Do you have any symptoms?" (e.g., blurred vision, chest pain, difficulty breathing, headache, weakness)     I'm having headache where they pulled the tooth on that side.  I'm taking Tylenol.   7. PREGNANCY: "Is there any chance you are pregnant?" "When was your last menstrual period?"     N/A  Protocols used: Blood Pressure - High-A-AH

## 2022-02-28 NOTE — Telephone Encounter (Signed)
  Chief Complaint: Elevated BP after Dr. Wynetta Emery added 1/2 pill to her BP medication. Symptoms: Has a headache but a week ago had a tooth pulled and ended up with dry socket and is in a lot of pain on the same side as the tooth was pulled she has the headache. Frequency: For a week now elevated BP, headache and dealing with dry socket pain Pertinent Negatives: Patient denies missing any BP medication doses Disposition: '[]'$ ED /'[]'$ Urgent Care (no appt availability in office) / '[x]'$ Appointment(In office/virtual)/ '[]'$  [place] [facility]/ '[]'$ Home Care/ '[]'$ Refused Recommended Disposition /'[]'$ [place] [facility]/ '[]'$  Follow-up with PCP Additional Notes: Appt made with Erin Mecum, PA-C for 03/01/2022 at 2:15.

## 2022-03-01 ENCOUNTER — Ambulatory Visit: Payer: No Typology Code available for payment source | Admitting: Physician Assistant

## 2022-03-01 ENCOUNTER — Inpatient Hospital Stay: Payer: No Typology Code available for payment source | Attending: Oncology

## 2022-03-01 ENCOUNTER — Encounter: Payer: Self-pay | Admitting: Physician Assistant

## 2022-03-01 ENCOUNTER — Ambulatory Visit (INDEPENDENT_AMBULATORY_CARE_PROVIDER_SITE_OTHER): Payer: No Typology Code available for payment source | Admitting: Physician Assistant

## 2022-03-01 VITALS — BP 152/76 | HR 49 | Temp 98.2°F | Ht 60.98 in | Wt 166.9 lb

## 2022-03-01 DIAGNOSIS — I10 Essential (primary) hypertension: Secondary | ICD-10-CM

## 2022-03-01 DIAGNOSIS — D649 Anemia, unspecified: Secondary | ICD-10-CM | POA: Insufficient documentation

## 2022-03-01 DIAGNOSIS — N1831 Chronic kidney disease, stage 3a: Secondary | ICD-10-CM

## 2022-03-01 DIAGNOSIS — I129 Hypertensive chronic kidney disease with stage 1 through stage 4 chronic kidney disease, or unspecified chronic kidney disease: Secondary | ICD-10-CM | POA: Diagnosis not present

## 2022-03-01 LAB — CBC
HCT: 32.4 % — ABNORMAL LOW (ref 36.0–46.0)
Hemoglobin: 10.4 g/dL — ABNORMAL LOW (ref 12.0–15.0)
MCH: 30.5 pg (ref 26.0–34.0)
MCHC: 32.1 g/dL (ref 30.0–36.0)
MCV: 95 fL (ref 80.0–100.0)
Platelets: 260 10*3/uL (ref 150–400)
RBC: 3.41 MIL/uL — ABNORMAL LOW (ref 3.87–5.11)
RDW: 12.4 % (ref 11.5–15.5)
WBC: 8.5 10*3/uL (ref 4.0–10.5)
nRBC: 0 % (ref 0.0–0.2)

## 2022-03-01 LAB — IRON AND TIBC
Iron: 50 ug/dL (ref 28–170)
Saturation Ratios: 15 % (ref 10.4–31.8)
TIBC: 325 ug/dL (ref 250–450)
UIBC: 275 ug/dL

## 2022-03-01 LAB — VITAMIN B12: Vitamin B-12: 1361 pg/mL — ABNORMAL HIGH (ref 180–914)

## 2022-03-01 LAB — FERRITIN: Ferritin: 35 ng/mL (ref 11–307)

## 2022-03-01 MED ORDER — LISINOPRIL 40 MG PO TABS: 80.0000 mg | ORAL_TABLET | Freq: Every day | ORAL | 1 refills | Status: DC

## 2022-03-01 NOTE — Progress Notes (Signed)
Established Patient Office Visit  Name: Teresa [name]   MRN: [id number]    DOB: [birth year]   Date:03/01/2022  Today's Provider: Talitha Givens, MHS, PA-C Introduced myself to the patient as a PA-C and provided education on APPs in clinical practice.         Subjective  Chief Complaint  Chief Complaint  Patient presents with   Hypertension    In April her lisinopril was increased from 40 mg to 60 mg.    Headache    Left side and left ear have been painful since she had a tooth pulled 2 weeks ago. Patient is currently taking Amoxicillin tid along with Tylenol and Aleve together for the pain as needed for the pain    HPI  Reports elevated BP for the past week  Recent stress includes having tooth pulled and getting dry socket.   Hypertension: - Medications: Lisinopril 60 mg PO QD, Chlorthalidone 25 mg PO QD - Compliance: excellent - Checking BP at home: States she is taking BP regularly. She has taken it multiple times per day the past 2-3 days with readings in the 170s  Reports that before this she was taking it daily and had readings in the 140s She states she tried taking an extra half of Lisinopril one day which provided some decrease to BP but not to goal range Recent stressors include having a tooth pulled and she got dry socket - has been packed and she is taking Amoxicillin TID for this.states her throat feels better and the left side of her face is tender and hurting She also just took her husband to the [place] and was told he needs bypass  She is taking Aleve and Tylenol for pain.    Patient Active Problem List   Diagnosis Date Noted   Pulmonary HTN ([place]) 05/22/2021   Senile purpura ([place]) 05/22/2021   History of colonic polyps 05/18/2020   Bradycardia 02/11/2018   Lumbar radiculopathy 09/27/2017   Advanced care planning/counseling discussion 01/22/2017   Type 2 diabetes mellitus with stage 3 chronic kidney disease, without long-term current use of insulin  ([place]) 07/04/2015   CVA (cerebral infarction) 04/03/2015   Obesity 05/28/5101   Lichen sclerosus 58/52/7782   Hypothyroidism 04/03/2015   Osteopenia 04/03/2015   Hyperlipidemia 04/03/2015   B12 deficiency 04/03/2015   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   CKD (chronic kidney disease), stage III ([place]) 04/03/2015   Anemia 03/10/2014    Past Surgical History:  Procedure Laterality Date   carpel tunnel     COLONOSCOPY WITH PROPOFOL N/A 08/30/2020   Procedure: COLONOSCOPY WITH PROPOFOL;  Surgeon: Virgel Manifold, MD;  Location: [facility] ENDOSCOPY;  Service: Gastroenterology;  Laterality: N/A;   ROTATOR CUFF REPAIR Left    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      Family History  Problem Relation Age of Onset   Hypertension Mother    Thyroid disease Mother    Glaucoma Mother    Coronary artery disease Mother    Stroke Father    Diabetes Brother    Hypertension Maternal Grandfather    Breast cancer Neg Hx     Social History   Tobacco Use   Smoking status: Former    Packs/day: 2.00    Types: Cigarettes    Quit date: 01/1965    Years since quitting: 57.1   Smokeless tobacco: Never  Substance Use Topics   Alcohol use: No    Alcohol/week: 0.0  standard drinks of alcohol     Current Outpatient Medications:    ASPIRIN 81 PO, Take by mouth daily., Disp: , Rfl:    blood glucose meter kit and supplies, Dispense based on patient and insurance preference. Use up to four times daily as directed. (FOR ICD-9 250.00, 250.01)., Disp: 1 each, Rfl: 0   Blood Glucose Monitoring Suppl (ONETOUCH VERIO) w/Device KIT, 1 each by Does not apply route daily., Disp: 1 kit, Rfl: 0   chlorthalidone (HYGROTON) 25 MG tablet, Take 1 tablet (25 mg total) by mouth daily., Disp: 90 tablet, Rfl: 1   clobetasol ointment (TEMOVATE) 0.05 %, APPLY  OINTMENT TOPICALLY TWICE DAILY, Disp: 30 g, Rfl: 0   clopidogrel (PLAVIX) 75 MG tablet, Take 1 tablet (75 mg total) by mouth daily., Disp: 90 tablet, Rfl:  3   folic acid (FOLVITE) 397 MCG tablet, Take 400 mcg by mouth daily., Disp: , Rfl:    glucose blood (ONETOUCH VERIO) test strip, Use as instructed, Disp: 100 each, Rfl: 12   glucose blood test strip, 1 each by Other route 2 (two) times daily. Use as instructed, Dx: E11.22, Disp: 100 each, Rfl: 12   levothyroxine (SYNTHROID) 50 MCG tablet, Take 1 tablet (50 mcg total) by mouth daily., Disp: 90 tablet, Rfl: 3   metFORMIN (GLUCOPHAGE-XR) 500 MG 24 hr tablet, Take 2 tablets (1,000 mg total) by mouth 2 (two) times daily., Disp: 360 tablet, Rfl: 1   OneTouch Delica Lancets 67H MISC, 1 each by Does not apply route daily., Disp: 100 each, Rfl: 3   rosuvastatin (CRESTOR) 10 MG tablet, Take 1 tablet (10 mg total) by mouth daily., Disp: 90 tablet, Rfl: 1   vitamin B-12 (CYANOCOBALAMIN) 1000 MCG tablet, Take 1,000 mcg by mouth daily., Disp: , Rfl:    lisinopril (ZESTRIL) 40 MG tablet, Take 2 tablets (80 mg total) by mouth daily., Disp: 135 tablet, Rfl: 1  Allergies  Allergen Reactions   Amlodipine Other (See Comments)    Ankle swelling   Atorvastatin     cramps   Evista [Raloxifene] Nausea Only    I personally reviewed active problem list, medication list, allergies, notes from last encounter, notes from last 2 encounters, lab results with the patient/caregiver today.   Review of Systems  Constitutional:  Negative for fever.  HENT:         Dental pain and left sided facial pain  Eyes:  Negative for blurred vision and double vision.  Respiratory:  Negative for shortness of breath and wheezing.   Cardiovascular:  Negative for chest pain, palpitations and leg swelling.  Musculoskeletal:  Negative for falls.  Neurological:  Positive for headaches (on left side of face). Negative for dizziness.      Objective  Vitals:   03/01/22 1359  BP: (!) 173/76  Pulse: (!) 49  Temp: 98.2 F (36.8 C)  TempSrc: Oral  SpO2: 99%  Weight: 166 lb 14.4 oz (75.7 kg)  Height: 5' 0.98" (1.549 m)    Body  mass index is 31.55 kg/m.  Physical Exam Vitals reviewed.  Constitutional:      General: She is awake.     Appearance: Normal appearance. She is well-developed and well-groomed. She is obese.  HENT:     Head: Normocephalic and atraumatic.  Cardiovascular:     Rate and Rhythm: Regular rhythm. Bradycardia present.     Pulses: Normal pulses.          Radial pulses are 2+ on the right side and 2+  on the left side.     Heart sounds: Normal heart sounds. No murmur heard.    No friction rub. No gallop.  Pulmonary:     Effort: Pulmonary effort is normal. No respiratory distress.     Breath sounds: Normal breath sounds. No stridor. No wheezing, rhonchi or rales.  Musculoskeletal:     Right lower leg: No edema.     Left lower leg: No edema.  Skin:    General: Skin is warm.  Neurological:     Mental Status: She is alert.  Psychiatric:        Mood and Affect: Mood normal.        Speech: Speech normal.        Behavior: Behavior normal. Behavior is cooperative.      Recent Results (from the past 2160 hour(s))  Basic metabolic panel     Status: Abnormal   Collection Time: 12/18/21  9:34 AM  Result Value Ref Range   Glucose 106 (H) 70 - 99 mg/dL   BUN 37 (H) 8 - 27 mg/dL   Creatinine, Ser 1.23 (H) 0.57 - 1.00 mg/dL   eGFR 46 (L) >59 mL/min/1.73   BUN/Creatinine Ratio 30 (H) 12 - 28   Sodium 144 134 - 144 mmol/L   Potassium 5.3 (H) 3.5 - 5.2 mmol/L   Chloride 110 (H) 96 - 106 mmol/L   CO2 21 20 - 29 mmol/L   Calcium 9.7 8.7 - 10.3 mg/dL  Basic metabolic panel     Status: Abnormal   Collection Time: 12/25/21  9:55 AM  Result Value Ref Range   Glucose 106 (H) 70 - 99 mg/dL   BUN 27 8 - 27 mg/dL   Creatinine, Ser 1.14 (H) 0.57 - 1.00 mg/dL   eGFR 50 (L) >59 mL/min/1.73   BUN/Creatinine Ratio 24 12 - 28   Sodium 144 134 - 144 mmol/L   Potassium 5.1 3.5 - 5.2 mmol/L   Chloride 108 (H) 96 - 106 mmol/L   CO2 21 20 - 29 mmol/L   Calcium 9.7 8.7 - 10.3 mg/dL  Iron and TIBC      Status: None   Collection Time: 03/01/22 12:48 PM  Result Value Ref Range   Iron 50 28 - 170 ug/dL   TIBC 325 250 - 450 ug/dL   Saturation Ratios 15 10.4 - 31.8 %   UIBC 275 ug/dL    Comment: Performed at [facility], [address] [place], [place] [address]  Ferritin     Status: None   Collection Time: 03/01/22 12:48 PM  Result Value Ref Range   Ferritin 35 11 - 307 ng/mL    Comment: Performed at [facility], [address] [place], [place] [address]  CBC     Status: Abnormal   Collection Time: 03/01/22 12:48 PM  Result Value Ref Range   WBC 8.5 4.0 - 10.5 K/uL   RBC 3.41 (L) 3.87 - 5.11 MIL/uL   Hemoglobin 10.4 (L) 12.0 - 15.0 g/dL   HCT 32.4 (L) 36.0 - 46.0 %   MCV 95.0 80.0 - 100.0 fL   MCH 30.5 26.0 - 34.0 pg   MCHC 32.1 30.0 - 36.0 g/dL   RDW 12.4 11.5 - 15.5 %   Platelets 260 150 - 400 K/uL   nRBC 0.0 0.0 - 0.2 %    Comment: Performed at [facility], [address] [place], [place] [address]     PHQ2/9:    03/01/2022  2:02 PM 12/18/2021    9:31 AM 03/23/2021    9:57 AM 03/19/2021    9:49 AM 05/23/2020    8:00 AM  Depression screen PHQ 2/9  Decreased Interest 0 2 0 0 0  Down, Depressed, Hopeless 0 0 0 0 0  PHQ - 2 Score 0 2 0 0 0  Altered sleeping 0 1   0  Tired, decreased energy 2 1   0  Change in appetite 0 2   0  Feeling bad or failure about yourself  0 0   0  Trouble concentrating 0 2   0  Moving slowly or fidgety/restless 0 0   0  Suicidal thoughts 0 0   0  PHQ-9 Score 2 8   0  Difficult doing work/chores Not difficult at all Not difficult at all   Not difficult at all      Fall Risk:    03/01/2022    2:02 PM 12/18/2021    9:31 AM 03/19/2021    9:48 AM 05/23/2020    8:00 AM 03/10/2020    1:08 PM  Fall Risk   Falls in the past year? 0 0 1 0 0  Comment   tripped over some toys    Number falls in past yr: 0 0 0 0   Injury with Fall? 0 0 0 0   Risk for fall due to :  No Fall Risks Medication side effect No Fall Risks  Medication side effect  Follow up Falls evaluation completed Falls evaluation completed Falls evaluation completed;Education provided;Falls prevention discussed Falls evaluation completed Falls evaluation completed;Education provided;Falls prevention discussed      Functional Status Survey:      Assessment & Plan  Problem List Items Addressed This Visit       Genitourinary   Hypertensive CKD (chronic kidney disease)    Chronic, historic condition She is currently taking Lisinopril 60 mg PO QD and Chlorthalidone 25 mg PO QD - does not report concern for side effects Review of previous notes shows she had increase to 60 mg in April and appeared to be doing well until about 3 weeks ago. States she started noticing BP in 140s at home and now is having results in the 170s after her tooth extraction and news regarding husband Believe that some of elevations may be stress related but will increase Lisinopril to 80 mg PO QD today. Recommend she continue to take daily BP readings at home and bring records back in 3 weeks for monitoring Reviewed return and ED precautions Will check CMP, CBC and TSH to rule out other etiology.  Follow up in 3 weeks.       Other Visit Diagnoses     Elevated blood pressure reading with diagnosis of hypertension    -  Primary Acute exacerbation of chronic hypertensive CKD She is currently taking Lisinopril 60 mg PO QD and Chlorthalidone 25 mg PO QD - does not report concern for side effects Review of previous notes shows she had increase to 60 mg in April and appeared to be doing well until about 3 weeks ago. States she started noticing BP in 140s at home and now is having results in the 170s after her tooth extraction and news regarding husband Believe that some of elevations may be stress related but will increase Lisinopril to 80 mg PO QD today. Recommend she continue to take daily BP readings at home and bring records back in 3 weeks for monitoring Reviewed  return  and ED precautions Will check CMP, CBC and TSH to rule out other etiology.  Follow up in 3 weeks.    Relevant Medications   lisinopril (ZESTRIL) 40 MG tablet   Other Relevant Orders   TSH   Comp Met (CMET)   CBC w/Diff        Return in about 3 weeks (around 03/22/2022) for HTN.   I, Daja Shuping E Adams Hinch, PA-C, have reviewed all documentation for this visit. The documentation on 03/01/22 for the exam, diagnosis, procedures, and orders are all accurate and complete.   Talitha Givens, MHS, PA-C [facility]

## 2022-03-01 NOTE — Patient Instructions (Signed)
Please take two Lisinopril tablets each day (this will equal 80 mg per day)  Continue taking your other medications as directed  We will keep you updated on the results of your labs once they are available  Please let us know if you have any questions or concerns  Please follow up with [place] in 3 weeks or sooner if you are still having blood pressure readings higher than 150s/80s  Continue taking your blood pressure daily and write it down. Bring those results with you to follow up  If you see readings over 180s/100s please call the office answering service If you have these readings with headaches, chest pain, vision changes, trouble breathing; please go to the ED.

## 2022-03-01 NOTE — Assessment & Plan Note (Signed)
Chronic, historic condition She is currently taking Lisinopril 60 mg PO QD and Chlorthalidone 25 mg PO QD - does not report concern for side effects Review of previous notes shows she had increase to 60 mg in April and appeared to be doing well until about 3 weeks ago. States she started noticing BP in 140s at home and now is having results in the 170s after her tooth extraction and news regarding husband Believe that some of elevations may be stress related but will increase Lisinopril to 80 mg PO QD today. Recommend she continue to take daily BP readings at home and bring records back in 3 weeks for monitoring Reviewed return and ED precautions Will check CMP, CBC and TSH to rule out other etiology.  Follow up in 3 weeks.

## 2022-03-02 LAB — CBC WITH DIFFERENTIAL/PLATELET
Basophils Absolute: 0 10*3/uL (ref 0.0–0.2)
Basos: 1 %
EOS (ABSOLUTE): 0.1 10*3/uL (ref 0.0–0.4)
Eos: 1 %
Hematocrit: 31 % — ABNORMAL LOW (ref 34.0–46.6)
Hemoglobin: 10.4 g/dL — ABNORMAL LOW (ref 11.1–15.9)
Immature Grans (Abs): 0 10*3/uL (ref 0.0–0.1)
Immature Granulocytes: 0 %
Lymphocytes Absolute: 3.4 10*3/uL — ABNORMAL HIGH (ref 0.7–3.1)
Lymphs: 40 %
MCH: 30.4 pg (ref 26.6–33.0)
MCHC: 33.5 g/dL (ref 31.5–35.7)
MCV: 91 fL (ref 79–97)
Monocytes Absolute: 0.7 10*3/uL (ref 0.1–0.9)
Monocytes: 8 %
Neutrophils Absolute: 4.1 10*3/uL (ref 1.4–7.0)
Neutrophils: 50 %
Platelets: 275 10*3/uL (ref 150–450)
RBC: 3.42 x10E6/uL — ABNORMAL LOW (ref 3.77–5.28)
RDW: 12.4 % (ref 11.7–15.4)
WBC: 8.3 10*3/uL (ref 3.4–10.8)

## 2022-03-02 LAB — TSH: TSH: 2.22 u[IU]/mL (ref 0.450–4.500)

## 2022-03-02 LAB — COMPREHENSIVE METABOLIC PANEL
ALT: 13 IU/L (ref 0–32)
AST: 15 IU/L (ref 0–40)
Albumin/Globulin Ratio: 2 (ref 1.2–2.2)
Albumin: 4.7 g/dL (ref 3.8–4.8)
Alkaline Phosphatase: 74 IU/L (ref 44–121)
BUN/Creatinine Ratio: 19 (ref 12–28)
BUN: 25 mg/dL (ref 8–27)
Bilirubin Total: <0.2 mg/dL (ref 0.0–1.2)
CO2: 19 mmol/L — ABNORMAL LOW (ref 20–29)
Calcium: 9.7 mg/dL (ref 8.7–10.3)
Chloride: 106 mmol/L (ref 96–106)
Creatinine, Ser: 1.31 mg/dL — ABNORMAL HIGH (ref 0.57–1.00)
Globulin, Total: 2.4 g/dL (ref 1.5–4.5)
Glucose: 85 mg/dL (ref 70–99)
Potassium: 5.4 mmol/L — ABNORMAL HIGH (ref 3.5–5.2)
Sodium: 141 mmol/L (ref 134–144)
Total Protein: 7.1 g/dL (ref 6.0–8.5)
eGFR: 42 mL/min/{1.73_m2} — ABNORMAL LOW (ref >59)

## 2022-03-05 ENCOUNTER — Encounter: Payer: Self-pay | Admitting: Oncology

## 2022-03-05 ENCOUNTER — Inpatient Hospital Stay: Payer: No Typology Code available for payment source | Admitting: Oncology

## 2022-03-08 ENCOUNTER — Encounter: Payer: Self-pay | Admitting: Oncology

## 2022-03-08 ENCOUNTER — Inpatient Hospital Stay (HOSPITAL_BASED_OUTPATIENT_CLINIC_OR_DEPARTMENT_OTHER): Payer: No Typology Code available for payment source | Admitting: Oncology

## 2022-03-08 VITALS — BP 159/56 | HR 54 | Temp 97.5°F | Resp 18 | Wt 165.0 lb

## 2022-03-08 DIAGNOSIS — D649 Anemia, unspecified: Secondary | ICD-10-CM

## 2022-03-08 NOTE — Progress Notes (Signed)
Hematology/Oncology Consult note [facility]  [phone] [phone]  Patient Care Team: Dorcas Carrow, DO as PCP - General (Family Medicine) Gabriel Cirri, NP as PCP - Family Medicine (Nurse Practitioner) Debbe Odea, MD as PCP - Cardiology (Cardiology) Merri Ray, MD as Referring Physician (Physical Medicine and Rehabilitation) Creig Hines, MD as Consulting Physician (Hematology and Oncology)   Name of the patient: Teresa [name]  [id number]  November 13, 1945   Date of visit: 03/08/22  Diagnosis- normocytic anemia likely secondary to chronic disease    Chief complaint/ Reason for visit- routine f/u of anemia  Heme/Onc history:  Patient is a 76 year old female with a past medical history significant for hypertension hyperlipidemia hypothyroidism who was seen by Dr. Maximino Greenland for symptoms of constipation.  As a part of her work-up she had her CBC checked which showed a low hemoglobin of 10.7 and hence she has been referred to [place].  Looking back at her prior CBCs patient has had a normal white count and a platelet count but hemoglobin has been between 10-11 since 2017 and has remained stable around that range.  There has been no improvement nor decline in her hemoglobin.  Ferritin levels were normal at 77 on 03/12/2021 with normal iron studies.  TSH in April 2022 was normal.    Interval history-patient reports feeling well.  Energy levels are stable.  Denies any new complaints at this time  ECOG PS- 1 Pain scale- 0   Review of systems- Review of Systems  Constitutional:  Negative for chills, fever, malaise/fatigue and weight loss.  HENT:  Negative for congestion, ear discharge and nosebleeds.   Eyes:  Negative for blurred vision.  Respiratory:  Negative for cough, hemoptysis, sputum production, shortness of breath and wheezing.   Cardiovascular:  Negative for chest pain, palpitations, orthopnea and claudication.   Gastrointestinal:  Negative for abdominal pain, blood in stool, constipation, diarrhea, heartburn, melena, nausea and vomiting.  Genitourinary:  Negative for dysuria, flank pain, frequency, hematuria and urgency.  Musculoskeletal:  Negative for back pain, joint pain and myalgias.  Skin:  Negative for rash.  Neurological:  Negative for dizziness, tingling, focal weakness, seizures, weakness and headaches.  Endo/Heme/Allergies:  Does not bruise/bleed easily.  Psychiatric/Behavioral:  Negative for depression and suicidal ideas. The patient does not have insomnia.       Allergies  Allergen Reactions   Amlodipine Other (See Comments)    Ankle swelling   Atorvastatin     cramps   Evista [Raloxifene] Nausea Only     Past Medical History:  Diagnosis Date   Diabetes mellitus without complication (HCC)    Heart murmur    Hyperlipidemia    Hypertension    Hypothyroidism    Lichen sclerosus    Obesity    Osteopenia    Stroke [facility])    Thyroid disease      Past Surgical History:  Procedure Laterality Date   carpel tunnel     COLONOSCOPY WITH PROPOFOL N/A 08/30/2020   Procedure: COLONOSCOPY WITH PROPOFOL;  Surgeon: Pasty Spillers, MD;  Location: [facility] ENDOSCOPY;  Service: Gastroenterology;  Laterality: N/A;   ROTATOR CUFF REPAIR Left    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      Social History   Socioeconomic History   Marital status: Married    Spouse name: Not on file   Number of children: Not on file   Years of education: Not on file   Highest education level: High school graduate  Occupational History   Occupation: retired  Tobacco Use   Smoking status: Former    Packs/day: 2.00    Types: Cigarettes    Quit date: 01/1965    Years since quitting: 4.1   Smokeless tobacco: Never  Vaping Use   Vaping Use: Never used  Substance and Sexual Activity   Alcohol use: No    Alcohol/week: 0.0 standard drinks of alcohol   Drug use: No   Sexual activity: Never  Other Topics  Concern   Not on file  Social History Narrative   Not on file   Social Determinants of Health   Financial Resource Strain: Low Risk  (03/23/2021)   Overall Financial Resource Strain (CARDIA)    Difficulty of Paying Living Expenses: Not hard at all  Food Insecurity: No Food Insecurity (03/23/2021)   Hunger Vital Sign    Worried About Running Out of Food in the Last Year: Never true    [place] in the Last Year: Never true  Transportation Needs: No Transportation Needs (03/23/2021)   PRAPARE - Hydrologist (Medical): No    Lack of Transportation (Non-Medical): No  Physical Activity: Inactive (03/19/2021)   Exercise Vital Sign    Days of Exercise per Week: 0 days    Minutes of Exercise per Session: 0 min  Stress: No Stress Concern Present (03/23/2021)   [place]    Feeling of Stress : Not at all  Social Connections: [place] (03/23/2021)   Social Connection and Isolation Panel [NHANES]    Frequency of Communication with Friends and Family: More than three times a week    Frequency of Social Gatherings with Friends and Family: More than three times a week    Attends Religious Services: More than 4 times per year    Active Member of [employer] or Organizations: Yes    Attends Music therapist: More than 4 times per year    Marital Status: Married  Human resources officer Violence: Not At Risk (03/23/2021)   Humiliation, Afraid, Rape, and Kick questionnaire    Fear of Current or Ex-Partner: No    Emotionally Abused: No    Physically Abused: No    Sexually Abused: No    Family History  Problem Relation Age of Onset   Hypertension Mother    Thyroid disease Mother    Glaucoma Mother    Coronary artery disease Mother    Stroke Father    Diabetes Brother    Hypertension Maternal Grandfather    Breast cancer Neg Hx      Current Outpatient Medications:    ASPIRIN 81 PO,  Take by mouth daily., Disp: , Rfl:    blood glucose meter kit and supplies, Dispense based on patient and insurance preference. Use up to four times daily as directed. (FOR ICD-9 250.00, 250.01)., Disp: 1 each, Rfl: 0   Blood Glucose Monitoring Suppl (ONETOUCH VERIO) w/Device KIT, 1 each by Does not apply route daily., Disp: 1 kit, Rfl: 0   chlorthalidone (HYGROTON) 25 MG tablet, Take 1 tablet (25 mg total) by mouth daily., Disp: 90 tablet, Rfl: 1   clobetasol ointment (TEMOVATE) 0.05 %, APPLY  OINTMENT TOPICALLY TWICE DAILY, Disp: 30 g, Rfl: 0   clopidogrel (PLAVIX) 75 MG tablet, Take 1 tablet (75 mg total) by mouth daily., Disp: 90 tablet, Rfl: 3   folic acid (FOLVITE) 017 MCG tablet, Take 400 mcg by mouth daily., Disp: ,  Rfl:    glucose blood (ONETOUCH VERIO) test strip, Use as instructed, Disp: 100 each, Rfl: 12   glucose blood test strip, 1 each by Other route 2 (two) times daily. Use as instructed, Dx: E11.22, Disp: 100 each, Rfl: 12   levothyroxine (SYNTHROID) 50 MCG tablet, Take 1 tablet (50 mcg total) by mouth daily., Disp: 90 tablet, Rfl: 3   lisinopril (ZESTRIL) 40 MG tablet, Take 2 tablets (80 mg total) by mouth daily., Disp: 135 tablet, Rfl: 1   metFORMIN (GLUCOPHAGE-XR) 500 MG 24 hr tablet, Take 2 tablets (1,000 mg total) by mouth 2 (two) times daily., Disp: 360 tablet, Rfl: 1   OneTouch Delica Lancets 57W MISC, 1 each by Does not apply route daily., Disp: 100 each, Rfl: 3   rosuvastatin (CRESTOR) 10 MG tablet, Take 1 tablet (10 mg total) by mouth daily., Disp: 90 tablet, Rfl: 1   vitamin B-12 (CYANOCOBALAMIN) 1000 MCG tablet, Take 1,000 mcg by mouth daily. (Patient not taking: Reported on 03/08/2022), Disp: , Rfl:   Physical exam:  Vitals:   03/08/22 0849  BP: (!) 159/56  Pulse: (!) 54  Resp: 18  Temp: (!) 97.5 F (36.4 C)  SpO2: 100%  Weight: 165 lb (74.8 kg)   Physical Exam Constitutional:      General: She is not in acute distress. Cardiovascular:     Rate and Rhythm:  Normal rate and regular rhythm.     Heart sounds: Normal heart sounds.  Pulmonary:     Effort: Pulmonary effort is normal.  Skin:    General: Skin is warm and dry.  Neurological:     Mental Status: She is alert and oriented to person, place, and time.         Latest Ref Rng & Units 03/01/2022    2:53 PM  CMP  Glucose 70 - 99 mg/dL 85   BUN 8 - 27 mg/dL 25   Creatinine 0.57 - 1.00 mg/dL 1.31   Sodium 134 - 144 mmol/L 141   Potassium 3.5 - 5.2 mmol/L 5.4   Chloride 96 - 106 mmol/L 106   CO2 20 - 29 mmol/L 19   Calcium 8.7 - 10.3 mg/dL 9.7   Total Protein 6.0 - 8.5 g/dL 7.1   Total Bilirubin 0.0 - 1.2 mg/dL <0.2   Alkaline Phos 44 - 121 IU/L 74   AST 0 - 40 IU/L 15   ALT 0 - 32 IU/L 13       Latest Ref Rng & Units 03/01/2022    2:53 PM  CBC  WBC 3.4 - 10.8 x10E3/uL 8.3   Hemoglobin 11.1 - 15.9 g/dL 10.4   Hematocrit 34.0 - 46.6 % 31.0   Platelets 150 - 450 x10E3/uL 275      Assessment and plan- Patient is a 76 y.o. female who is here for routine follow-up of normocytic anemia.  Patient's baseline hemoglobin runs between 10-11. Presently is 10.4 which is at her baseline.  She has previously had a complete anemia work-up but did not show any reversible etiology.  Her most recent labs from July 2023 showed a normal B12.  Iron studies at ferritin level of 35 and iron saturation of 15%.  We did discuss this if she would be interested in getting IV iron given that her iron saturation is less than 20% and ferritin levels are borderline low.  However overall patient feels well and does not wish to proceed with IV iron at this time especially given that her hemoglobin  is at her usual baseline.  I will therefore repeat CBC ferritin iron studies in 6 months and see her thereafter    Visit Diagnosis 1. Normocytic anemia      Dr. Randa Evens, MD, MPH [facility] at [facility] [phone] 03/08/2022 1:03 PM

## 2022-03-20 ENCOUNTER — Ambulatory Visit: Payer: Self-pay | Admitting: *Deleted

## 2022-03-20 ENCOUNTER — Ambulatory Visit (INDEPENDENT_AMBULATORY_CARE_PROVIDER_SITE_OTHER): Payer: No Typology Code available for payment source | Admitting: *Deleted

## 2022-03-20 DIAGNOSIS — B029 Zoster without complications: Secondary | ICD-10-CM | POA: Diagnosis not present

## 2022-03-20 DIAGNOSIS — Z0000 Encounter for general adult medical examination without abnormal findings: Secondary | ICD-10-CM | POA: Diagnosis not present

## 2022-03-20 NOTE — Telephone Encounter (Signed)
Message from Sharene Skeans sent at 03/20/2022 10:06 AM EDT  Summary: shingles   Pt has Shingles Under right breast and right side / started Wednesday of last week /pt has been with her husband all week at the hospital dealing with his health/ pt asked if she could get a medication for it until her appt on Friday / please advise           Call History   Type Contact Phone/Fax User  03/20/2022 10:05 AM EDT Phone (Incoming) Stevan Born E (Self) [phone] Jerilynn Mages) Alanda Slim   Reason for Disposition  [1] Shingles rash AND [2] onset > 72 hours ago (3 days)    Pt thinks it's Shingles but has not been diagnosed with it.  Answer Assessment - Initial Assessment Questions 1. APPEARANCE of RASH: "Describe the rash."      Shingles under right breast and around my side.   I think. 2. LOCATION: "Where is the rash located?"      Under right breast and around right side.  Not been diagnosed with Shingles.    She diagnosed herself. My husband had open heart surgery at the [facility].   I just brought him home from the hospital. 3. ONSET: "When did the rash start?"      Last Wed.     I had a tooth pulled and got dry socket but it's ok now after antibiotics.   4. ITCHING: "Does the rash itch?" If Yes, ask: "How bad is the itch?"  (Scale 1-10; or mild, moderate, severe)     No itching 5. PAIN: "Does the rash hurt?" If Yes, ask: "How bad is the pain?"  (Scale 0-10; or none, mild, moderate, severe)    - NONE (0): no pain    - MILD (1-3): doesn't interfere with normal activities     - MODERATE (4-7): interferes with normal activities or awakens from sleep     - SEVERE (8-10): excruciating pain, unable to do any normal activities     No burning     I can rub it but it doesn't hurt.    My bones hurt in shoulder blade, my neck, my back.   It only hurts on right side.    6. OTHER SYMPTOMS: "Do you have any other symptoms?" (e.g., fever)     No 7. PREGNANCY: "Is there any chance you are pregnant?"  "When was your last menstrual period?"     N/A  Protocols used: Shingles (Zoster)-A-AH

## 2022-03-20 NOTE — Progress Notes (Signed)
Subjective:   Teresa [name] is a 76 y.o. female who presents for Medicare Annual (Subsequent) preventive examination. I connected with  [name] on 03/20/22 by a telephone enabled telemedicine application and verified that I am speaking with the correct person using two identifiers.   I discussed the limitations of evaluation and management by telemedicine. The patient expressed understanding and agreed to proceed.  Patient location: home  Provider location: Tele-health-home     Review of Systems     Cardiac Risk Factors include: advanced age (>36mn, >>65women);diabetes mellitus;sedentary lifestyle     Objective:    Today's Vitals   03/20/22 0942  PainSc: 2    There is no height or weight on file to calculate BMI.     03/20/2022    9:43 AM 03/08/2022    8:41 AM 09/03/2021    1:58 PM 05/02/2021    1:32 PM 04/17/2021   10:53 AM 03/19/2021    9:48 AM 08/30/2020    9:50 AM  Advanced Directives  Does Patient Have a Medical Advance Directive? _0  No No  Would patient like information on creating a medical advance directive? No - Patient declined No - Patient declined No - Patient declined No - Patient declined No - Patient declined      Current Medications (verified) Outpatient Encounter Medications as of 03/20/2022  Medication Sig   ASPIRIN 81 PO Take by mouth daily.   blood glucose meter kit and supplies Dispense based on patient and insurance preference. Use up to four times daily as directed. (FOR ICD-9 250.00, 250.01).   Blood Glucose Monitoring Suppl (ONETOUCH VERIO) w/Device KIT 1 each by Does not apply route daily.   chlorthalidone (HYGROTON) 25 MG tablet Take 1 tablet (25 mg total) by mouth daily.   clobetasol ointment (TEMOVATE) 0.05 % APPLY  OINTMENT TOPICALLY TWICE DAILY   clopidogrel (PLAVIX) 75 MG tablet Take 1 tablet (75 mg total) by mouth daily.   folic acid (FOLVITE) 8021MCG tablet Take 400 mcg by mouth daily.   glucose blood (ONETOUCH  VERIO) test strip Use as instructed   glucose blood test strip 1 each by Other route 2 (two) times daily. Use as instructed, Dx: E11.22   levothyroxine (SYNTHROID) 50 MCG tablet Take 1 tablet (50 mcg total) by mouth daily.   lisinopril (ZESTRIL) 40 MG tablet Take 2 tablets (80 mg total) by mouth daily.   metFORMIN (GLUCOPHAGE-XR) 500 MG 24 hr tablet Take 2 tablets (1,000 mg total) by mouth 2 (two) times daily.   OneTouch Delica Lancets 311NMISC 1 each by Does not apply route daily.   rosuvastatin (CRESTOR) 10 MG tablet Take 1 tablet (10 mg total) by mouth daily.   vitamin B-12 (CYANOCOBALAMIN) 1000 MCG tablet Take 1,000 mcg by mouth daily.   No facility-administered encounter medications on file as of 03/20/2022.    Allergies (verified) Amlodipine, Atorvastatin, and Evista [raloxifene]   History: Past Medical History:  Diagnosis Date   Diabetes mellitus without complication ([facility]    Heart murmur    Hyperlipidemia    Hypertension    Hypothyroidism    Lichen sclerosus    Obesity    Osteopenia    Stroke ([facility]    Thyroid disease    Past Surgical History:  Procedure Laterality Date   carpel tunnel     COLONOSCOPY WITH PROPOFOL N/A 08/30/2020   Procedure: COLONOSCOPY WITH PROPOFOL;  Surgeon: TVirgel Manifold MD;  Location: [facility] ENDOSCOPY;  Service: Gastroenterology;  Laterality: N/A;   ROTATOR CUFF REPAIR Left    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     Family History  Problem Relation Age of Onset   Hypertension Mother    Thyroid disease Mother    Glaucoma Mother    Coronary artery disease Mother    Stroke Father    Diabetes Brother    Hypertension Maternal Grandfather    Breast cancer Neg Hx    Social History   Socioeconomic History   Marital status: Married    Spouse name: Not on file   Number of children: Not on file   Years of education: Not on file   Highest education level: High school graduate  Occupational History   Occupation: retired  Tobacco Use   Smoking  status: Former    Packs/day: 2.00    Types: Cigarettes    Quit date: 01/1965    Years since quitting: 57.2   Smokeless tobacco: Never  Vaping Use   Vaping Use: Never used  Substance and Sexual Activity   Alcohol use: No    Alcohol/week: 0.0 standard drinks of alcohol   Drug use: No   Sexual activity: Never  Other Topics Concern   Not on file  Social History Narrative   Not on file   Social Determinants of Health   Financial Resource Strain: Low Risk  (03/20/2022)   Overall Financial Resource Strain (CARDIA)    Difficulty of Paying Living Expenses: Not hard at all  Food Insecurity: No Food Insecurity (03/20/2022)   Hunger Vital Sign    Worried About Running Out of Food in the Last Year: Never true    [place] in the Last Year: Never true  Transportation Needs: No Transportation Needs (03/20/2022)   PRAPARE - Hydrologist (Medical): No    Lack of Transportation (Non-Medical): No  Physical Activity: Inactive (03/20/2022)   Exercise Vital Sign    Days of Exercise per Week: 0 days    Minutes of Exercise per Session: 0 min  Stress: No Stress Concern Present (03/20/2022)   [place]    Feeling of Stress : Only a little  Social Connections: Socially Integrated (03/20/2022)   Social Connection and Isolation Panel [NHANES]    Frequency of Communication with Friends and Family: More than three times a week    Frequency of Social Gatherings with Friends and Family: Twice a week    Attends Religious Services: More than 4 times per year    Active Member of [employer] or Organizations: Yes    Attends Music therapist: More than 4 times per year    Marital Status: Married    Tobacco Counseling Counseling given: Not Answered   Clinical Intake:  Pre-visit preparation completed: Yes  Pain : 0-10 Pain Score: 2  Pain Type: Neuropathic pain Pain Location: Shoulder Pain Descriptors /  Indicators: Aching, Burning Pain Relieving Factors: tylenol  Pain Relieving Factors: tylenol  Nutritional Risks: None Diabetes: Yes CBG done?: No Did pt. bring in CBG monitor from home?: No  How often do you need to have someone help you when you read instructions, pamphlets, or other written materials from your doctor or pharmacy?: 1 - Never  Diabetic?  Yes  Nutrition Risk Assessment:  Has the patient had any N/V/D within the last 2 months?  No  Does the patient have any non-healing wounds?  No  Has the patient had  any unintentional weight loss or weight gain?  No   Diabetes:  Is the patient diabetic?  Yes  If diabetic, was a CBG obtained today?  No  Did the patient bring in their glucometer from home?  No  How often do you monitor your CBG's? 1 x a day.   Financial Strains and Diabetes Management:  Are you having any financial strains with the device, your supplies or your medication? No .  Does the patient want to be seen by Chronic Care Management for management of their diabetes?  No  Would the patient like to be referred to a Nutritionist or for Diabetic Management?  No   Diabetic Exams:  Diabetic Eye Exam: Pt has been advised about the importance in completing this exam. Diabetic Foot Exam:  Pt has been advised about the importance in completing this exam.      Information entered by :: Leroy Kennedy LPN   Activities of Daily Living    03/20/2022    9:49 AM  In your present state of health, do you have any difficulty performing the following activities:  Hearing? 0  Vision? 0  Difficulty concentrating or making decisions? 0  Walking or climbing stairs? 0  Dressing or bathing? 0  Doing errands, shopping? 0  Preparing Food and eating ? N  Using the Toilet? N  In the past six months, have you accidently leaked urine? N  Do you have problems with loss of bowel control? N  Managing your Medications? N  Managing your Finances? N  Housekeeping or managing your  Housekeeping? N    Patient Care Team: [name], DO as PCP - General (Family Medicine) Kathrine Haddock, NP as PCP - Family Medicine (Nurse Practitioner) Kate Sable, MD as PCP - Cardiology (Cardiology) Sharlet Salina, MD as Referring Physician (Physical Medicine and Rehabilitation) Sindy Guadeloupe, MD as Consulting Physician (Hematology and Oncology)  Indicate any recent Medical Services you may have received from other than [facility] providers in the past year (date may be approximate).     Assessment:   This is a routine wellness examination for [place].  Hearing/Vision screen Hearing Screening - Comments:: No trouble hearing Vision Screening - Comments:: Up to date [facility]  Dietary issues and exercise activities discussed: Current Exercise Habits: The patient does not participate in regular exercise at present   Goals Addressed             This Visit's Progress    Weight (lb) < 200 lb (90.7 kg)         Depression Screen    03/20/2022    9:48 AM 03/01/2022    2:02 PM 12/18/2021    9:31 AM 03/23/2021    9:57 AM 03/19/2021    9:49 AM 05/23/2020    8:00 AM 03/10/2020    1:08 PM  PHQ 2/9 Scores  PHQ - 2 Score 0 0 2 0 0 0 1  PHQ- 9 Score _0 0     Fall Risk    03/20/2022    9:43 AM 03/01/2022    2:02 PM 12/18/2021    9:31 AM 03/19/2021    9:48 AM 05/23/2020    8:00 AM  Fall Risk   Falls in the past year? 0 0 0 1 0  Comment    tripped over some toys   Number falls in past yr: 0 0 0 0 0  Injury with Fall? 0 0 0 0 0  Risk for  fall due to :   No Fall Risks Medication side effect No Fall Risks  Follow up Falls evaluation completed;Education provided;Falls prevention discussed Falls evaluation completed Falls evaluation completed Falls evaluation completed;Education provided;Falls prevention discussed Falls evaluation completed    FALL RISK PREVENTION PERTAINING TO THE HOME:  Any stairs in or around the home? Yes  If so, are there any without handrails?  No  Home free of loose throw rugs in walkways, pet beds, electrical cords, etc? Yes  Adequate lighting in your home to reduce risk of falls? Yes   ASSISTIVE DEVICES UTILIZED TO PREVENT FALLS:  Life alert? No  Use of a cane, walker or w/c? No  Grab bars in the bathroom? Yes  Shower chair or bench in shower? Yes  Elevated toilet seat or a handicapped toilet? Yes   TIMED UP AND GO:  Was the test performed? No .    Cognitive Function:        03/20/2022    9:45 AM 03/19/2021    9:52 AM 03/10/2020    1:12 PM 02/17/2019    9:56 AM 02/04/2018    4:07 PM  6CIT Screen  What Year? 0 points 0 points 0 points 0 points 0 points  What month? 0 points 0 points 0 points 0 points 0 points  What time? 0 points 3 points 0 points 0 points 0 points  Count back from 20 0 points 0 points 0 points 0 points 0 points  Months in reverse 2 points 0 points 0 points 0 points 0 points  Repeat phrase 2 points 10 points 4 points 0 points 0 points  Total Score 4 points 13 points 4 points 0 points 0 points    Immunizations Immunization History  Administered Date(s) Administered   Fluad Quad(high Dose 65+) 05/20/2019, 05/02/2020, 05/22/2021   Influenza, High Dose Seasonal PF 07/10/2016, 04/18/2017, 05/26/2018   Influenza,inj,Quad PF,6+ Mos 07/04/2015   Influenza-Unspecified 07/04/2015   Moderna Sars-Covid-2 Vaccination 10/06/2019, 11/03/2019, 06/07/2020   Pneumococcal Conjugate-13 04/13/2014   Pneumococcal Polysaccharide-23 10/14/2014   Td 03/25/2003, 11/28/2020   Tdap 10/05/2010   Zoster, Live 09/10/2006    TDAP status: Due, Education has been provided regarding the importance of this vaccine. Advised may receive this vaccine at local pharmacy or Health Dept. Aware to provide a copy of the vaccination record if obtained from local pharmacy or Health Dept. Verbalized acceptance and understanding.  Flu Vaccine status: Up to date  Pneumococcal vaccine status: Up to date  Covid-19 vaccine status:  Information provided on how to obtain vaccines.   Qualifies for Shingles Vaccine? Yes   Zostavax completed Yes   Shingrix Completed?: No.    Education has been provided regarding the importance of this vaccine. Patient has been advised to call insurance company to determine out of pocket expense if they have not yet received this vaccine. Advised may also receive vaccine at local pharmacy or Health Dept. Verbalized acceptance and understanding.  Screening Tests Health Maintenance  Topic Date Due   INFLUENZA VACCINE  03/12/2022   COVID-19 Vaccine (4 - Booster for Moderna series) 04/05/2022 (Originally 08/02/2020)   Zoster Vaccines- Shingrix (1 of 2) 06/20/2022 (Originally 07/08/1965)   FOOT EXAM  05/22/2022   HEMOGLOBIN A1C  05/22/2022   OPHTHALMOLOGY EXAM  08/21/2022   COLONOSCOPY (Pts 45-50yr Insurance coverage will need to be confirmed)  08/30/2025   TETANUS/TDAP  11/29/2030   Pneumonia Vaccine 76 Years old  Completed   DEXA SCAN  Completed   Hepatitis C Screening  Completed   HPV VACCINES  Aged Out    Health Maintenance  Health Maintenance Due  Topic Date Due   INFLUENZA VACCINE  03/12/2022    Colorectal cancer screening: No longer required.   Mammogram status: Completed  . Repeat every year  Bone Density status: Ordered  . Pt provided with contact info and advised to call to schedule appt.  Lung Cancer Screening: (Low Dose CT Chest recommended if Age 60-80 years, 30 pack-year currently smoking OR have quit w/in 15years.) does not qualify.   Lung Cancer Screening Referral:   Additional Screening:  Hepatitis C Screening: does not qualify; Completed 2017  Vision Screening: Recommended annual ophthalmology exams for early detection of glaucoma and other disorders of the eye. Is the patient up to date with their annual eye exam?  Yes  Who is the provider or what is the name of the office in which the patient attends annual eye exams? [facility] If pt is not  established with a provider, would they like to be referred to a provider to establish care? No .   Dental Screening: Recommended annual dental exams for proper oral hygiene  Community Resource Referral / Chronic Care Management: CRR required this visit?  No   CCM required this visit?  No      Plan:     I have personally reviewed and noted the following in the patient's chart:   Medical and social history Use of alcohol, tobacco or illicit drugs  Current medications and supplements including opioid prescriptions.  Functional ability and status Nutritional status Physical activity Advanced directives List of other physicians Hospitalizations, surgeries, and ER visits in previous 12 months Vitals Screenings to include cognitive, depression, and falls Referrals and appointments  In addition, I have reviewed and discussed with patient certain preventive protocols, quality metrics, and best practice recommendations. A written personalized care plan for preventive services as well as general preventive health recommendations were provided to patient.     Leroy Kennedy, LPN   02/14/1949   Nurse Notes:  Patient states she thinks she has shingles, not very painful has had rash since 03-13-2022, She has an appointment 8-11 for a follow up.  Will call office today

## 2022-03-20 NOTE — Telephone Encounter (Signed)
  Chief Complaint: She thinks she has shingles.   Never had it before.   "I diagnosed myself". Symptoms: Bones in right shoulder blade and back hurts.   Has a rash that does not itch or burn under right breast and going around her side but not all the way to her back. Frequency: Rash and pain started last Wed.   (Been at [facility] hospital with husband who had bypass surgery) Pertinent Negatives: Patient denies burning, itching or having shingles before. Disposition: '[]'$ ED /'[x]'$ Urgent Care (no appt availability in office) / '[]'$ Appointment(In office/virtual)/ '[]'$  [facility]/ '[]'$ Home Care/ '[]'$ Refused Recommended Disposition /'[]'$ [facility]/ '[]'$  Follow-up with PCP Additional Notes: Has an appt. On 03/22/2022 Fri with Dr. Wynetta Emery however there are no openings until Sept.   I checked to see about getting her in earlier.   She has decided to go to the [facility] [facility] today.

## 2022-03-20 NOTE — Patient Instructions (Signed)
Ms. Teresa [name] , Thank you for taking time to come for your Medicare Wellness Visit. I appreciate your ongoing commitment to your health goals. Please review the following plan we discussed and let me know if I can assist you in the future.   Screening recommendations/referrals: Colonoscopy: no longer required Mammogram: up to date Bone Density: ordered Recommended yearly ophthalmology/optometry visit for glaucoma screening and checkup Recommended yearly dental visit for hygiene and checkup  Vaccinations: Influenza vaccine: up to date Pneumococcal vaccine: up to date Tdap vaccine: Education provided Shingles vaccine: Education provided    Advanced directives: Education provided  Conditions/risks identified:   Next appointment: 03-22-2022 @ 11:00  [facility] 76 Years and Older, Female Preventive care refers to lifestyle choices and visits with your health care provider that can promote health and wellness. What does preventive care include? A yearly physical exam. This is also called an annual well check. Dental exams once or twice a year. Routine eye exams. Ask your health care provider how often you should have your eyes checked. Personal lifestyle choices, including: Daily care of your teeth and gums. Regular physical activity. Eating a healthy diet. Avoiding tobacco and drug use. Limiting alcohol use. Practicing safe sex. Taking low-dose aspirin every day. Taking vitamin and mineral supplements as recommended by your health care provider. What happens during an annual well check? The services and screenings done by your health care provider during your annual well check will depend on your age, overall health, lifestyle risk factors, and family history of disease. Counseling  Your health care provider may ask you questions about your: Alcohol use. Tobacco use. Drug use. Emotional well-being. Home and relationship well-being. Sexual activity. Eating  habits. History of falls. Memory and ability to understand (cognition). Work and work Statistician. Reproductive health. Screening  You may have the following tests or measurements: Height, weight, and BMI. Blood pressure. Lipid and cholesterol levels. These may be checked every 5 years, or more frequently if you are over 68 years old. Skin check. Lung cancer screening. You may have this screening every year starting at age 76 if you have a 30-pack-year history of smoking and currently smoke or have quit within the past 15 years. Fecal occult blood test (FOBT) of the stool. You may have this test every year starting at age 76. Flexible sigmoidoscopy or colonoscopy. You may have a sigmoidoscopy every 5 years or a colonoscopy every 10 years starting at age 76. Hepatitis C blood test. Hepatitis B blood test. Sexually transmitted disease (STD) testing. Diabetes screening. This is done by checking your blood sugar (glucose) after you have not eaten for a while (fasting). You may have this done every 1-3 years. Bone density scan. This is done to screen for osteoporosis. You may have this done starting at age 76. Mammogram. This may be done every 1-2 years. Talk to your health care provider about how often you should have regular mammograms. Talk with your health care provider about your test results, treatment options, and if necessary, the need for more tests. Vaccines  Your health care provider may recommend certain vaccines, such as: Influenza vaccine. This is recommended every year. Tetanus, diphtheria, and acellular pertussis (Tdap, Td) vaccine. You may need a Td booster every 10 years. Zoster vaccine. You may need this after age 25. Pneumococcal 13-valent conjugate (PCV13) vaccine. One dose is recommended after age 10. Pneumococcal polysaccharide (PPSV23) vaccine. One dose is recommended after age 76. Talk to your health care provider about which  screenings and vaccines you need and how  often you need them. This information is not intended to replace advice given to you by your health care provider. Make sure you discuss any questions you have with your health care provider. Document Released: 08/25/2015 Document Revised: 04/17/2016 Document Reviewed: 05/30/2015 Elsevier Interactive Patient Education  2017 [employer] Prevention in the Home Falls can cause injuries. They can happen to people of all ages. There are many things you can do to make your home safe and to help prevent falls. What can I do on the outside of my home? Regularly fix the edges of walkways and driveways and fix any cracks. Remove anything that might make you trip as you walk through a door, such as a raised step or threshold. Trim any bushes or trees on the path to your home. Use bright outdoor lighting. Clear any walking paths of anything that might make someone trip, such as rocks or tools. Regularly check to see if handrails are loose or broken. Make sure that both sides of any steps have handrails. Any raised decks and porches should have guardrails on the edges. Have any leaves, snow, or ice cleared regularly. Use sand or salt on walking paths during winter. Clean up any spills in your garage right away. This includes oil or grease spills. What can I do in the bathroom? Use night lights. Install grab bars by the toilet and in the tub and shower. Do not use towel bars as grab bars. Use non-skid mats or decals in the tub or shower. If you need to sit down in the shower, use a plastic, non-slip stool. Keep the floor dry. Clean up any water that spills on the floor as soon as it happens. Remove soap buildup in the tub or shower regularly. Attach bath mats securely with double-sided non-slip rug tape. Do not have throw rugs and other things on the floor that can make you trip. What can I do in the bedroom? Use night lights. Make sure that you have a light by your bed that is easy to  reach. Do not use any sheets or blankets that are too big for your bed. They should not hang down onto the floor. Have a firm chair that has side arms. You can use this for support while you get dressed. Do not have throw rugs and other things on the floor that can make you trip. What can I do in the kitchen? Clean up any spills right away. Avoid walking on wet floors. Keep items that you use a lot in easy-to-reach places. If you need to reach something above you, use a strong step stool that has a grab bar. Keep electrical cords out of the way. Do not use floor polish or wax that makes floors slippery. If you must use wax, use non-skid floor wax. Do not have throw rugs and other things on the floor that can make you trip. What can I do with my stairs? Do not leave any items on the stairs. Make sure that there are handrails on both sides of the stairs and use them. Fix handrails that are broken or loose. Make sure that handrails are as long as the stairways. Check any carpeting to make sure that it is firmly attached to the stairs. Fix any carpet that is loose or worn. Avoid having throw rugs at the top or bottom of the stairs. If you do have throw rugs, attach them to the floor with carpet  tape. Make sure that you have a light switch at the top of the stairs and the bottom of the stairs. If you do not have them, ask someone to add them for you. What else can I do to help prevent falls? Wear shoes that: Do not have high heels. Have rubber bottoms. Are comfortable and fit you well. Are closed at the toe. Do not wear sandals. If you use a stepladder: Make sure that it is fully opened. Do not climb a closed stepladder. Make sure that both sides of the stepladder are locked into place. Ask someone to hold it for you, if possible. Clearly mark and make sure that you can see: Any grab bars or handrails. First and last steps. Where the edge of each step is. Use tools that help you move  around (mobility aids) if they are needed. These include: Canes. Walkers. Scooters. Crutches. Turn on the lights when you go into a dark area. Replace any light bulbs as soon as they burn out. Set up your furniture so you have a clear path. Avoid moving your furniture around. If any of your floors are uneven, fix them. If there are any pets around you, be aware of where they are. Review your medicines with your doctor. Some medicines can make you feel dizzy. This can increase your chance of falling. Ask your doctor what other things that you can do to help prevent falls. This information is not intended to replace advice given to you by your health care provider. Make sure you discuss any questions you have with your health care provider. Document Released: 05/25/2009 Document Revised: 01/04/2016 Document Reviewed: 09/02/2014 Elsevier Interactive Patient Education  2017 [employer].

## 2022-03-22 ENCOUNTER — Ambulatory Visit: Payer: Medicare HMO

## 2022-03-22 ENCOUNTER — Ambulatory Visit (INDEPENDENT_AMBULATORY_CARE_PROVIDER_SITE_OTHER): Payer: No Typology Code available for payment source | Admitting: Family Medicine

## 2022-03-22 ENCOUNTER — Encounter: Payer: Self-pay | Admitting: Family Medicine

## 2022-03-22 VITALS — BP 153/84 | HR 56 | Temp 98.2°F | Ht 60.98 in | Wt 162.5 lb

## 2022-03-22 DIAGNOSIS — N1831 Chronic kidney disease, stage 3a: Secondary | ICD-10-CM

## 2022-03-22 DIAGNOSIS — B029 Zoster without complications: Secondary | ICD-10-CM

## 2022-03-22 DIAGNOSIS — I129 Hypertensive chronic kidney disease with stage 1 through stage 4 chronic kidney disease, or unspecified chronic kidney disease: Secondary | ICD-10-CM

## 2022-03-22 MED ORDER — TRAMADOL HCL 50 MG PO TABS: 50.0000 mg | ORAL_TABLET | Freq: Three times a day (TID) | ORAL | 0 refills | Status: AC | PRN

## 2022-03-22 MED ORDER — NORTRIPTYLINE HCL 25 MG PO CAPS: 25.0000 mg | ORAL_CAPSULE | Freq: Every day | ORAL | 1 refills | Status: DC

## 2022-03-22 NOTE — Progress Notes (Signed)
BP (!) 153/84   Pulse (!) 56   Temp 98.2 F (36.8 C) (Oral)   Ht 5' 0.98" (1.549 m)   Wt 162 lb 8 oz (73.7 kg)   LMP  (LMP Unknown)   SpO2 98%   BMI 30.72 kg/m    Subjective:    Patient ID: Teresa [name], female    DOB: [birth year], 76 y.o.   MRN: [id number]  HPI: Teresa [name] is a 76 y.o. female  Chief Complaint  Patient presents with   Hypertension   Herpes Zoster    Under right breast. Patient did not want the pain med for the shingles when she was seen at [facility] but believes that she wants something to day.    HYPERTENSION Hypertension status: uncontrolled  Satisfied with current treatment? yes Duration of hypertension: chronic BP monitoring frequency:  a few times a week BP medication side effects:  no Medication compliance: excellent compliance Previous BP meds:lisinopril, chlorthalidone Aspirin: yes Recurrent headaches: no Visual changes: no Palpitations: no Dyspnea: no Chest pain: no Lower extremity edema: no Dizzy/lightheaded: no  RASH Duration:   4 days   Location: under R breast  Itching: no Burning: yes Redness: yes Oozing: yes Scaling: yes Blisters: yes Painful: yes Fevers: no Change in detergents/soaps/personal care products: no Recent illness: no Recent travel:no History of same: no Context: stable Alleviating factors: nothing Treatments attempted: valacyclovir, prednisone Shortness of breath: no  Throat/tongue swelling: no Myalgias/arthralgias: no   Relevant past medical, surgical, family and social history reviewed and updated as indicated. Interim medical history since our last visit reviewed. Allergies and medications reviewed and updated.  Review of Systems  Constitutional: Negative.   Respiratory: Negative.    Cardiovascular: Negative.   Gastrointestinal: Negative.   Musculoskeletal: Negative.   Skin:  Positive for rash. Negative for color change, pallor and wound.  Neurological: Negative.   Psychiatric/Behavioral:  Negative.      Per HPI unless specifically indicated above     Objective:    BP (!) 153/84   Pulse (!) 56   Temp 98.2 F (36.8 C) (Oral)   Ht 5' 0.98" (1.549 m)   Wt 162 lb 8 oz (73.7 kg)   LMP  (LMP Unknown)   SpO2 98%   BMI 30.72 kg/m   Wt Readings from Last 3 Encounters:  03/22/22 162 lb 8 oz (73.7 kg)  03/08/22 165 lb (74.8 kg)  03/01/22 166 lb 14.4 oz (75.7 kg)    Physical Exam Vitals and nursing note reviewed.  Constitutional:      General: She is not in acute distress.    Appearance: Normal appearance. She is not ill-appearing, toxic-appearing or diaphoretic.  HENT:     Head: Normocephalic and atraumatic.     Right Ear: External ear normal.     Left Ear: External ear normal.     Nose: Nose normal.     Mouth/Throat:     Mouth: Mucous membranes are moist.     Pharynx: Oropharynx is clear.  Eyes:     General: No scleral icterus.       Right eye: No discharge.        Left eye: No discharge.     Extraocular Movements: Extraocular movements intact.     Conjunctiva/sclera: Conjunctivae normal.     Pupils: Pupils are equal, round, and reactive to light.  Cardiovascular:     Rate and Rhythm: Normal rate and regular rhythm.     Pulses: Normal pulses.  Heart sounds: Normal heart sounds. No murmur heard.    No friction rub. No gallop.  Pulmonary:     Effort: Pulmonary effort is normal. No respiratory distress.     Breath sounds: Normal breath sounds. No stridor. No wheezing, rhonchi or rales.  Chest:     Chest wall: No tenderness.  Musculoskeletal:        General: Normal range of motion.     Cervical back: Normal range of motion and neck supple.  Skin:    General: Skin is warm and dry.     Capillary Refill: Capillary refill takes less than 2 seconds.     Coloration: Skin is not jaundiced or pale.     Findings: Rash (under R breast) present. No bruising, erythema or lesion.  Neurological:     General: No focal deficit present.     Mental Status: She is  alert and oriented to person, place, and time. Mental status is at baseline.  Psychiatric:        Mood and Affect: Mood normal.        Behavior: Behavior normal.        Thought Content: Thought content normal.        Judgment: Judgment normal.     Results for orders placed or performed in visit on 03/01/22  TSH  Result Value Ref Range   TSH 2.220 0.450 - 4.500 uIU/mL  Comp Met (CMET)  Result Value Ref Range   Glucose 85 70 - 99 mg/dL   BUN 25 8 - 27 mg/dL   Creatinine, Ser 5.83 (H) 0.57 - 1.00 mg/dL   eGFR 42 (L) >64 VO/MWV/0.04   BUN/Creatinine Ratio 19 12 - 28   Sodium 141 134 - 144 mmol/L   Potassium 5.4 (H) 3.5 - 5.2 mmol/L   Chloride 106 96 - 106 mmol/L   CO2 19 (L) 20 - 29 mmol/L   Calcium 9.7 8.7 - 10.3 mg/dL   Total Protein 7.1 6.0 - 8.5 g/dL   Albumin 4.7 3.8 - 4.8 g/dL   Globulin, Total 2.4 1.5 - 4.5 g/dL   Albumin/Globulin Ratio 2.0 1.2 - 2.2   Bilirubin Total <0.2 0.0 - 1.2 mg/dL   Alkaline Phosphatase 74 44 - 121 IU/L   AST 15 0 - 40 IU/L   ALT 13 0 - 32 IU/L  CBC w/Diff  Result Value Ref Range   WBC 8.3 3.4 - 10.8 x10E3/uL   RBC 3.42 (L) 3.77 - 5.28 x10E6/uL   Hemoglobin 10.4 (L) 11.1 - 15.9 g/dL   Hematocrit 32.9 (L) 71.0 - 46.6 %   MCV 91 79 - 97 fL   MCH 30.4 26.6 - 33.0 pg   MCHC 33.5 31.5 - 35.7 g/dL   RDW 24.6 83.8 - 83.0 %   Platelets 275 150 - 450 x10E3/uL   Neutrophils 50 Not Estab. %   Lymphs 40 Not Estab. %   Monocytes 8 Not Estab. %   Eos 1 Not Estab. %   Basos 1 Not Estab. %   Neutrophils Absolute 4.1 1.4 - 7.0 x10E3/uL   Lymphocytes Absolute 3.4 (H) 0.7 - 3.1 x10E3/uL   Monocytes Absolute 0.7 0.1 - 0.9 x10E3/uL   EOS (ABSOLUTE) 0.1 0.0 - 0.4 x10E3/uL   Basophils Absolute 0.0 0.0 - 0.2 x10E3/uL   Immature Granulocytes 0 Not Estab. %   Immature Grans (Abs) 0.0 0.0 - 0.1 x10E3/uL      Assessment & Plan:   Problem List Items Addressed This Visit  Genitourinary   Hypertensive CKD (chronic kidney disease)    Still running  high. Will continue to monitor. Call with any concerns. Recheck 1-2 weeks.       Other Visit Diagnoses     Herpes zoster without complication    -  Primary   Having significant pain- will add nortritpyline and tramadol and recheck 1-2 weeks. Call with any concerns.    Relevant Medications   valACYclovir (VALTREX) 1000 MG tablet        Follow up plan: Return 1-2 weeks.

## 2022-03-22 NOTE — Assessment & Plan Note (Signed)
Still running high. Will continue to monitor. Call with any concerns. Recheck 1-2 weeks.

## 2022-04-03 ENCOUNTER — Encounter: Payer: Self-pay | Admitting: Family Medicine

## 2022-04-05 ENCOUNTER — Encounter: Payer: Self-pay | Admitting: Family Medicine

## 2022-04-05 ENCOUNTER — Ambulatory Visit (INDEPENDENT_AMBULATORY_CARE_PROVIDER_SITE_OTHER): Payer: No Typology Code available for payment source | Admitting: Family Medicine

## 2022-04-05 VITALS — BP 137/64 | HR 70 | Temp 97.7°F | Wt 160.9 lb

## 2022-04-05 DIAGNOSIS — N1831 Chronic kidney disease, stage 3a: Secondary | ICD-10-CM | POA: Diagnosis not present

## 2022-04-05 DIAGNOSIS — B0229 Other postherpetic nervous system involvement: Secondary | ICD-10-CM | POA: Diagnosis not present

## 2022-04-05 DIAGNOSIS — I129 Hypertensive chronic kidney disease with stage 1 through stage 4 chronic kidney disease, or unspecified chronic kidney disease: Secondary | ICD-10-CM

## 2022-04-05 MED ORDER — ONDANSETRON HCL 4 MG PO TABS: 4.0000 mg | ORAL_TABLET | Freq: Three times a day (TID) | ORAL | 0 refills | Status: DC | PRN

## 2022-04-05 MED ORDER — NORTRIPTYLINE HCL 10 MG PO CAPS: 10.0000 mg | ORAL_CAPSULE | Freq: Every day | ORAL | 1 refills | Status: DC

## 2022-04-05 NOTE — Progress Notes (Signed)
BP 137/64 (BP Location: Left Arm, Cuff Size: Normal)   Pulse 70   Temp 97.7 F (36.5 C) (Oral)   Wt 160 lb 14.4 oz (73 kg)   LMP  (LMP Unknown)   SpO2 98%   BMI 30.42 kg/m    Subjective:    Patient ID: Teresa [name], female    DOB: 11/11/1945, 76 y.o.   MRN: [id number]  HPI: Teresa [name] is a 77 y.o. female  Chief Complaint  Patient presents with   Hypertension   Herpes Zoster   HYPERTENSION Hypertension status: controlled  Satisfied with current treatment? yes Duration of hypertension: chronic BP monitoring frequency:  not checking BP medication side effects:  no Medication compliance: excellent compliance Previous BP meds:chlorthalidone, lisinopril Aspirin: yes Recurrent headaches: no Visual changes: no Palpitations: no Dyspnea: no Chest pain: no Lower extremity edema: no Dizzy/lightheaded: no  Still having pain with her shingles. Pain is still about an 8/10. Still not dried up all the way but getting much better. Was not able to tolerate nortriptyline due to fatigue. Tried a tramadol last night and was very tired with it. No other concerns or complaints at this time.   Relevant past medical, surgical, family and social history reviewed and updated as indicated. Interim medical history since our last visit reviewed. Allergies and medications reviewed and updated.  Review of Systems  Constitutional: Negative.   Respiratory: Negative.    Cardiovascular: Negative.   Gastrointestinal: Negative.   Musculoskeletal: Negative.   Skin:  Positive for rash. Negative for color change, pallor and wound.  Neurological: Negative.   Psychiatric/Behavioral: Negative.      Per HPI unless specifically indicated above     Objective:    BP 137/64 (BP Location: Left Arm, Cuff Size: Normal)   Pulse 70   Temp 97.7 F (36.5 C) (Oral)   Wt 160 lb 14.4 oz (73 kg)   LMP  (LMP Unknown)   SpO2 98%   BMI 30.42 kg/m   Wt Readings from Last 3 Encounters:  04/05/22  160 lb 14.4 oz (73 kg)  03/22/22 162 lb 8 oz (73.7 kg)  03/08/22 165 lb (74.8 kg)    Physical Exam Vitals and nursing note reviewed.  Constitutional:      General: She is not in acute distress.    Appearance: Normal appearance. She is normal weight. She is not ill-appearing, toxic-appearing or diaphoretic.  HENT:     Head: Normocephalic and atraumatic.     Right Ear: External ear normal.     Left Ear: External ear normal.     Nose: Nose normal.     Mouth/Throat:     Mouth: Mucous membranes are moist.     Pharynx: Oropharynx is clear.  Eyes:     General: No scleral icterus.       Right eye: No discharge.        Left eye: No discharge.     Extraocular Movements: Extraocular movements intact.     Conjunctiva/sclera: Conjunctivae normal.     Pupils: Pupils are equal, round, and reactive to light.  Cardiovascular:     Rate and Rhythm: Normal rate and regular rhythm.     Pulses: Normal pulses.     Heart sounds: Normal heart sounds. No murmur heard.    No friction rub. No gallop.  Pulmonary:     Effort: Pulmonary effort is normal. No respiratory distress.     Breath sounds: Normal breath sounds. No stridor. No wheezing, rhonchi or  rales.  Chest:     Chest wall: No tenderness.  Musculoskeletal:        General: Normal range of motion.     Cervical back: Normal range of motion and neck supple.  Skin:    General: Skin is warm and dry.     Capillary Refill: Capillary refill takes less than 2 seconds.     Coloration: Skin is not jaundiced or pale.     Findings: Rash present. No bruising, erythema or lesion.  Neurological:     General: No focal deficit present.     Mental Status: She is alert and oriented to person, place, and time. Mental status is at baseline.  Psychiatric:        Mood and Affect: Mood normal.        Behavior: Behavior normal.        Thought Content: Thought content normal.        Judgment: Judgment normal.     Results for orders placed or performed in visit  on 03/01/22  TSH  Result Value Ref Range   TSH 2.220 0.450 - 4.500 uIU/mL  Comp Met (CMET)  Result Value Ref Range   Glucose 85 70 - 99 mg/dL   BUN 25 8 - 27 mg/dL   Creatinine, Ser 1.31 (H) 0.57 - 1.00 mg/dL   eGFR 42 (L) >59 mL/min/1.73   BUN/Creatinine Ratio 19 12 - 28   Sodium 141 134 - 144 mmol/L   Potassium 5.4 (H) 3.5 - 5.2 mmol/L   Chloride 106 96 - 106 mmol/L   CO2 19 (L) 20 - 29 mmol/L   Calcium 9.7 8.7 - 10.3 mg/dL   Total Protein 7.1 6.0 - 8.5 g/dL   Albumin 4.7 3.8 - 4.8 g/dL   Globulin, Total 2.4 1.5 - 4.5 g/dL   Albumin/Globulin Ratio 2.0 1.2 - 2.2   Bilirubin Total <0.2 0.0 - 1.2 mg/dL   Alkaline Phosphatase 74 44 - 121 IU/L   AST 15 0 - 40 IU/L   ALT 13 0 - 32 IU/L  CBC w/Diff  Result Value Ref Range   WBC 8.3 3.4 - 10.8 x10E3/uL   RBC 3.42 (L) 3.77 - 5.28 x10E6/uL   Hemoglobin 10.4 (L) 11.1 - 15.9 g/dL   Hematocrit 31.0 (L) 34.0 - 46.6 %   MCV 91 79 - 97 fL   MCH 30.4 26.6 - 33.0 pg   MCHC 33.5 31.5 - 35.7 g/dL   RDW 12.4 11.7 - 15.4 %   Platelets 275 150 - 450 x10E3/uL   Neutrophils 50 Not Estab. %   Lymphs 40 Not Estab. %   Monocytes 8 Not Estab. %   Eos 1 Not Estab. %   Basos 1 Not Estab. %   Neutrophils Absolute 4.1 1.4 - 7.0 x10E3/uL   Lymphocytes Absolute 3.4 (H) 0.7 - 3.1 x10E3/uL   Monocytes Absolute 0.7 0.1 - 0.9 x10E3/uL   EOS (ABSOLUTE) 0.1 0.0 - 0.4 x10E3/uL   Basophils Absolute 0.0 0.0 - 0.2 x10E3/uL   Immature Granulocytes 0 Not Estab. %   Immature Grans (Abs) 0.0 0.0 - 0.1 x10E3/uL      Assessment & Plan:   Problem List Items Addressed This Visit       Genitourinary   Hypertensive CKD (chronic kidney disease)    Under good control on current regimen. Continue current regimen. Continue to monitor. Call with any concerns.        Other Visit Diagnoses     Postherpetic neuralgia    -  Primary   Stopped her nortriptyline due to fatigue. Will cut down to $Remov'10mg'[id number]$  and recheck 1-2 weeks. Continue PRN tramadol        Follow up  plan: Return 1-2 weeks.

## 2022-04-05 NOTE — Assessment & Plan Note (Signed)
Under good control on current regimen. Continue current regimen. Continue to monitor. Call with any concerns.   

## 2022-04-19 ENCOUNTER — Ambulatory Visit: Payer: No Typology Code available for payment source | Admitting: Family Medicine

## 2022-04-19 ENCOUNTER — Encounter: Payer: Self-pay | Admitting: Family Medicine

## 2022-04-19 ENCOUNTER — Ambulatory Visit (INDEPENDENT_AMBULATORY_CARE_PROVIDER_SITE_OTHER): Payer: No Typology Code available for payment source | Admitting: Family Medicine

## 2022-04-19 VITALS — BP 143/75 | HR 84 | Temp 98.7°F | Wt 160.5 lb

## 2022-04-19 DIAGNOSIS — B0229 Other postherpetic nervous system involvement: Secondary | ICD-10-CM

## 2022-04-19 NOTE — Progress Notes (Signed)
BP (!) 143/75   Pulse 84   Temp 98.7 F (37.1 C) (Oral)   Wt 160 lb 8 oz (72.8 kg)   LMP  (LMP Unknown)   SpO2 98%   BMI 30.34 kg/m    Subjective:    Patient ID: Leanord [name], female    DOB: 1945-09-03, 76 y.o.   MRN: [id number]  HPI: Teresa [name] is a 76 y.o. female  Chief Complaint  Patient presents with   Herpes Zoster   Tolerating her nortriptyline. Feeling better. Feeling more like herself. Rash has dried up but still feeling prickly and tender. No other concerns or complaints.   Relevant past medical, surgical, family and social history reviewed and updated as indicated. Interim medical history since our last visit reviewed. Allergies and medications reviewed and updated.  Review of Systems  Constitutional: Negative.   Respiratory: Negative.    Cardiovascular: Negative.   Gastrointestinal: Negative.   Musculoskeletal: Negative.   Neurological: Negative.   Psychiatric/Behavioral: Negative.      Per HPI unless specifically indicated above     Objective:    BP (!) 143/75   Pulse 84   Temp 98.7 F (37.1 C) (Oral)   Wt 160 lb 8 oz (72.8 kg)   LMP  (LMP Unknown)   SpO2 98%   BMI 30.34 kg/m   Wt Readings from Last 3 Encounters:  04/19/22 160 lb 8 oz (72.8 kg)  04/05/22 160 lb 14.4 oz (73 kg)  03/22/22 162 lb 8 oz (73.7 kg)    Physical Exam Vitals and nursing note reviewed.  Constitutional:      General: She is not in acute distress.    Appearance: Normal appearance. She is normal weight. She is not ill-appearing, toxic-appearing or diaphoretic.  HENT:     Head: Normocephalic and atraumatic.     Right Ear: External ear normal.     Left Ear: External ear normal.     Nose: Nose normal.     Mouth/Throat:     Mouth: Mucous membranes are moist.     Pharynx: Oropharynx is clear.  Eyes:     General: No scleral icterus.       Right eye: No discharge.        Left eye: No discharge.     Extraocular Movements: Extraocular movements intact.      Conjunctiva/sclera: Conjunctivae normal.     Pupils: Pupils are equal, round, and reactive to light.  Cardiovascular:     Rate and Rhythm: Normal rate and regular rhythm.     Pulses: Normal pulses.     Heart sounds: Normal heart sounds. No murmur heard.    No friction rub. No gallop.  Pulmonary:     Effort: Pulmonary effort is normal. No respiratory distress.     Breath sounds: Normal breath sounds. No stridor. No wheezing, rhonchi or rales.  Chest:     Chest wall: No tenderness.  Musculoskeletal:        General: Normal range of motion.     Cervical back: Normal range of motion and neck supple.  Skin:    General: Skin is warm and dry.     Capillary Refill: Capillary refill takes less than 2 seconds.     Coloration: Skin is not jaundiced or pale.     Findings: Erythema (under R breast, no vesicles) present. No bruising, lesion or rash.  Neurological:     General: No focal deficit present.     Mental Status: She is  alert and oriented to person, place, and time. Mental status is at baseline.  Psychiatric:        Mood and Affect: Mood normal.        Behavior: Behavior normal.        Thought Content: Thought content normal.        Judgment: Judgment normal.     Results for orders placed or performed in visit on 03/01/22  TSH  Result Value Ref Range   TSH 2.220 0.450 - 4.500 uIU/mL  Comp Met (CMET)  Result Value Ref Range   Glucose 85 70 - 99 mg/dL   BUN 25 8 - 27 mg/dL   Creatinine, Ser 1.31 (H) 0.57 - 1.00 mg/dL   eGFR 42 (L) >59 mL/min/1.73   BUN/Creatinine Ratio 19 12 - 28   Sodium 141 134 - 144 mmol/L   Potassium 5.4 (H) 3.5 - 5.2 mmol/L   Chloride 106 96 - 106 mmol/L   CO2 19 (L) 20 - 29 mmol/L   Calcium 9.7 8.7 - 10.3 mg/dL   Total Protein 7.1 6.0 - 8.5 g/dL   Albumin 4.7 3.8 - 4.8 g/dL   Globulin, Total 2.4 1.5 - 4.5 g/dL   Albumin/Globulin Ratio 2.0 1.2 - 2.2   Bilirubin Total <0.2 0.0 - 1.2 mg/dL   Alkaline Phosphatase 74 44 - 121 IU/L   AST 15 0 - 40 IU/L    ALT 13 0 - 32 IU/L  CBC w/Diff  Result Value Ref Range   WBC 8.3 3.4 - 10.8 x10E3/uL   RBC 3.42 (L) 3.77 - 5.28 x10E6/uL   Hemoglobin 10.4 (L) 11.1 - 15.9 g/dL   Hematocrit 31.0 (L) 34.0 - 46.6 %   MCV 91 79 - 97 fL   MCH 30.4 26.6 - 33.0 pg   MCHC 33.5 31.5 - 35.7 g/dL   RDW 12.4 11.7 - 15.4 %   Platelets 275 150 - 450 x10E3/uL   Neutrophils 50 Not Estab. %   Lymphs 40 Not Estab. %   Monocytes 8 Not Estab. %   Eos 1 Not Estab. %   Basos 1 Not Estab. %   Neutrophils Absolute 4.1 1.4 - 7.0 x10E3/uL   Lymphocytes Absolute 3.4 (H) 0.7 - 3.1 x10E3/uL   Monocytes Absolute 0.7 0.1 - 0.9 x10E3/uL   EOS (ABSOLUTE) 0.1 0.0 - 0.4 x10E3/uL   Basophils Absolute 0.0 0.0 - 0.2 x10E3/uL   Immature Granulocytes 0 Not Estab. %   Immature Grans (Abs) 0.0 0.0 - 0.1 x10E3/uL      Assessment & Plan:   Problem List Items Addressed This Visit       Nervous and Auditory   Postherpetic neuralgia - Primary    Doing well with nortriptyline. Will check back in about a month at her physical. Call with any concerns.         Follow up plan: Return in about 5 weeks (around 05/23/2022) for for physcial.   15 minutes spent with patient today.

## 2022-04-19 NOTE — Assessment & Plan Note (Signed)
Doing well with nortriptyline. Will check back in about a month at her physical. Call with any concerns.

## 2022-05-06 ENCOUNTER — Other Ambulatory Visit: Payer: Self-pay | Admitting: Family Medicine

## 2022-05-06 NOTE — Telephone Encounter (Signed)
Requested Prescriptions  Pending Prescriptions Disp Refills  . metFORMIN (GLUCOPHAGE-XR) 500 MG 24 hr tablet [Pharmacy Med Name: METFORMIN ER TAB $Remo'500MG'WbinW$  GP] 360 tablet 1    Sig: TAKE 2 TABLETS TWO TIMES A DAY     Endocrinology:  Diabetes - Biguanides Failed - 05/06/2022  8:08 AM      Failed - Cr in normal range and within 360 days    Creatinine, Ser  Date Value Ref Range Status  03/01/2022 1.31 (H) 0.57 - 1.00 mg/dL Final         Failed - eGFR in normal range and within 360 days    GFR calc Af Amer  Date Value Ref Range Status  05/23/2020 79 >59 mL/min/1.73 Final    Comment:    **[facility] currently reports eGFR in compliance with the current**   recommendations of the [employer]. [facility] will   update reporting as new guidelines are published from the NKF-ASN   Task force.    GFR, Estimated  Date Value Ref Range Status  04/17/2021 53 (L) >60 mL/min Final    Comment:    (NOTE) Calculated using the CKD-EPI Creatinine Equation (2021)    eGFR  Date Value Ref Range Status  03/01/2022 42 (L) >59 mL/min/1.73 Final         Failed - B12 Level in normal range and within 720 days    Vitamin B-12  Date Value Ref Range Status  03/01/2022 1,361 (H) 180 - 914 pg/mL Final    Comment:    (NOTE) This assay is not validated for testing neonatal or myeloproliferative syndrome specimens for Vitamin B12 levels. Performed at [facility], [place] [address], [place], [place] [address]          Passed - HBA1C is between 0 and 7.9 and within 180 days    HB A1C (BAYER DCA - WAIVED)  Date Value Ref Range Status  11/20/2021 6.6 (H) 4.8 - 5.6 % Final    Comment:             Prediabetes: 5.7 - 6.4          Diabetes: >6.4          Glycemic control for adults with diabetes: <7.0          Passed - Valid encounter within last 6 months    Recent Outpatient Visits          2 weeks ago Postherpetic neuralgia   [place], Megan P, DO   1 month ago  Postherpetic neuralgia   [facility], Megan P, DO   1 month ago Herpes zoster without complication   [employer], Megan P, DO   2 months ago Elevated blood pressure reading with diagnosis of hypertension   [facility] Mecum, Erin E, PA-C   4 months ago 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 ([place])   [place], [place], DO      Future Appointments            In 3 weeks [place], Megan P, DO [facility], [facility]           Passed - CBC within normal limits and completed in the last 12 months    WBC  Date Value Ref Range Status  03/01/2022 8.3 3.4 - 10.8 x10E3/uL Final  03/01/2022 8.5 4.0 - 10.5 K/uL Final   RBC  Date Value Ref Range Status  03/01/2022 3.42 (L) 3.77 - 5.28 x10E6/uL Final  03/01/2022 3.41 (L) 3.87 - 5.11 MIL/uL Final   Hemoglobin  Date Value Ref Range Status  03/01/2022 10.4 (L) 11.1 - 15.9 g/dL Final  03/01/2022 10.4 (L) 12.0 - 15.0 g/dL Final   HCT  Date Value Ref Range Status  03/01/2022 32.4 (L) 36.0 - 46.0 % Final   Hematocrit  Date Value Ref Range Status  03/01/2022 31.0 (L) 34.0 - 46.6 % Final   MCHC  Date Value Ref Range Status  03/01/2022 33.5 31.5 - 35.7 g/dL Final  03/01/2022 32.1 30.0 - 36.0 g/dL Final   [facility]  Date Value Ref Range Status  03/01/2022 30.4 26.6 - 33.0 pg Final  03/01/2022 30.5 26.0 - 34.0 pg Final   MCV  Date Value Ref Range Status  03/01/2022 91 79 - 97 fL Final  03/01/2022 95.0 80.0 - 100.0 fL Final   No results found for: "PLTCOUNTKUC", "LABPLAT", "POCPLA" RDW  Date Value Ref Range Status  03/01/2022 12.4 11.7 - 15.4 % Final  03/01/2022 12.4 11.5 - 15.5 % Final

## 2022-05-07 ENCOUNTER — Ambulatory Visit: Payer: Self-pay

## 2022-05-07 NOTE — Telephone Encounter (Signed)
appt

## 2022-05-07 NOTE — Telephone Encounter (Signed)
Patient states that she has had shingles for about 2 months under her arm and breast. Patient states she has been in to see Dr. Wynetta Emery twice for this. Patient states she would like some kind of cream or ointment to apply to help with the pain and burning. Please advise.     Chief Complaint: Shingles rash to right arm and under breast, has been seen for this in office. Asking for a cream to help pain and "dry feeling." Symptoms: 2 months ago, pain Frequency: 2 months ago Pertinent Negatives: Patient denies fever Disposition: '[]'$ ED /'[]'$ Urgent Care (no appt availability in office) / '[]'$ Appointment(In office/virtual)/ '[]'$  [facility]/ '[]'$ Home Care/ '[]'$ Refused Recommended Disposition /'[]'$ [facility]/ '[x]'$  Follow-up with PCP Additional Notes: Please advise pt.  Answer Assessment - Initial Assessment Questions 1. APPEARANCE of RASH: "Describe the rash."      Dry, feels like it's going to crack 2. LOCATION: "Where is the rash located?"      Below 3. ONSET: "When did the rash start?"      Breast and arm - right 4. ITCHING: "Does the rash itch?" If Yes, ask: "How bad is the itch?"  (Scale 1-10; or mild, moderate, severe)     None 5. PAIN: "Does the rash hurt?" If Yes, ask: "How bad is the pain?"  (Scale 0-10; or none, mild, moderate, severe)    - NONE (0): no pain    - MILD (1-3): doesn't interfere with normal activities     - MODERATE (4-7): interferes with normal activities or awakens from sleep     - SEVERE (8-10): excruciating pain, unable to do any normal activities     Severe 6. OTHER SYMPTOMS: "Do you have any other symptoms?" (e.g., fever)     No 7. PREGNANCY: "Is there any chance you are pregnant?" "When was your last menstrual period?"     No  Protocols used: Shingles (Zoster)-A-AH

## 2022-05-08 NOTE — Telephone Encounter (Signed)
Pt scheduled  

## 2022-05-09 ENCOUNTER — Ambulatory Visit (INDEPENDENT_AMBULATORY_CARE_PROVIDER_SITE_OTHER): Payer: No Typology Code available for payment source | Admitting: Family Medicine

## 2022-05-09 ENCOUNTER — Encounter: Payer: Self-pay | Admitting: Family Medicine

## 2022-05-09 VITALS — BP 126/80 | HR 72 | Temp 97.7°F | Wt 162.0 lb

## 2022-05-09 DIAGNOSIS — Z23 Encounter for immunization: Secondary | ICD-10-CM | POA: Diagnosis not present

## 2022-05-09 DIAGNOSIS — B0229 Other postherpetic nervous system involvement: Secondary | ICD-10-CM

## 2022-05-09 MED ORDER — NORTRIPTYLINE HCL 10 MG PO CAPS: 10.0000 mg | ORAL_CAPSULE | Freq: Every day | ORAL | 1 refills | Status: DC

## 2022-05-09 NOTE — Progress Notes (Signed)
BP 126/80   Pulse 72   Temp 97.7 F (36.5 C)   Wt 162 lb (73.5 kg)   LMP  (LMP Unknown)   SpO2 99%   BMI 30.63 kg/m    Subjective:    Patient ID: Teresa [name], female    DOB: July 19, 1946, 76 y.o.   MRN: [id number]  HPI: Teresa [name] is a 76 y.o. female  Chief Complaint  Patient presents with   Pain    Patient states she is still having pain at the site where she had shingles. Pain is worse under the right breast.    Her shingles pain has returned and is burning and very uncomfortable. She stopped her nortriptyline about a week ago because she didn't realize she should continue it. She is otherwise feeling well.   Relevant past medical, surgical, family and social history reviewed and updated as indicated. Interim medical history since our last visit reviewed. Allergies and medications reviewed and updated.  Review of Systems  Constitutional: Negative.   Respiratory: Negative.    Cardiovascular: Negative.   Gastrointestinal: Negative.   Musculoskeletal: Negative.   Skin: Negative.   Neurological: Negative.   Psychiatric/Behavioral: Negative.      Per HPI unless specifically indicated above     Objective:    BP 126/80   Pulse 72   Temp 97.7 F (36.5 C)   Wt 162 lb (73.5 kg)   LMP  (LMP Unknown)   SpO2 99%   BMI 30.63 kg/m   Wt Readings from Last 3 Encounters:  05/09/22 162 lb (73.5 kg)  04/19/22 160 lb 8 oz (72.8 kg)  04/05/22 160 lb 14.4 oz (73 kg)    Physical Exam Vitals and nursing note reviewed.  Constitutional:      General: She is not in acute distress.    Appearance: Normal appearance. She is normal weight. She is not ill-appearing, toxic-appearing or diaphoretic.  HENT:     Head: Normocephalic and atraumatic.     Right Ear: External ear normal.     Left Ear: External ear normal.     Nose: Nose normal.     Mouth/Throat:     Mouth: Mucous membranes are moist.     Pharynx: Oropharynx is clear.  Eyes:     General: No scleral  icterus.       Right eye: No discharge.        Left eye: No discharge.     Extraocular Movements: Extraocular movements intact.     Conjunctiva/sclera: Conjunctivae normal.     Pupils: Pupils are equal, round, and reactive to light.  Cardiovascular:     Rate and Rhythm: Normal rate and regular rhythm.     Pulses: Normal pulses.     Heart sounds: Normal heart sounds. No murmur heard.    No friction rub. No gallop.  Pulmonary:     Effort: Pulmonary effort is normal. No respiratory distress.     Breath sounds: Normal breath sounds. No stridor. No wheezing, rhonchi or rales.  Chest:     Chest wall: No tenderness.  Musculoskeletal:        General: Normal range of motion.     Cervical back: Normal range of motion and neck supple.  Skin:    General: Skin is warm and dry.     Capillary Refill: Capillary refill takes less than 2 seconds.     Coloration: Skin is not jaundiced or pale.     Findings: No bruising, erythema, lesion or  rash.  Neurological:     General: No focal deficit present.     Mental Status: She is alert and oriented to person, place, and time. Mental status is at baseline.  Psychiatric:        Mood and Affect: Mood normal.        Behavior: Behavior normal.        Thought Content: Thought content normal.        Judgment: Judgment normal.     Results for orders placed or performed in visit on 03/01/22  TSH  Result Value Ref Range   TSH 2.220 0.450 - 4.500 uIU/mL  Comp Met (CMET)  Result Value Ref Range   Glucose 85 70 - 99 mg/dL   BUN 25 8 - 27 mg/dL   Creatinine, Ser 1.31 (H) 0.57 - 1.00 mg/dL   eGFR 42 (L) >59 mL/min/1.73   BUN/Creatinine Ratio 19 12 - 28   Sodium 141 134 - 144 mmol/L   Potassium 5.4 (H) 3.5 - 5.2 mmol/L   Chloride 106 96 - 106 mmol/L   CO2 19 (L) 20 - 29 mmol/L   Calcium 9.7 8.7 - 10.3 mg/dL   Total Protein 7.1 6.0 - 8.5 g/dL   Albumin 4.7 3.8 - 4.8 g/dL   Globulin, Total 2.4 1.5 - 4.5 g/dL   Albumin/Globulin Ratio 2.0 1.2 - 2.2    Bilirubin Total <0.2 0.0 - 1.2 mg/dL   Alkaline Phosphatase 74 44 - 121 IU/L   AST 15 0 - 40 IU/L   ALT 13 0 - 32 IU/L  CBC w/Diff  Result Value Ref Range   WBC 8.3 3.4 - 10.8 x10E3/uL   RBC 3.42 (L) 3.77 - 5.28 x10E6/uL   Hemoglobin 10.4 (L) 11.1 - 15.9 g/dL   Hematocrit 31.0 (L) 34.0 - 46.6 %   MCV 91 79 - 97 fL   MCH 30.4 26.6 - 33.0 pg   MCHC 33.5 31.5 - 35.7 g/dL   RDW 12.4 11.7 - 15.4 %   Platelets 275 150 - 450 x10E3/uL   Neutrophils 50 Not Estab. %   Lymphs 40 Not Estab. %   Monocytes 8 Not Estab. %   Eos 1 Not Estab. %   Basos 1 Not Estab. %   Neutrophils Absolute 4.1 1.4 - 7.0 x10E3/uL   Lymphocytes Absolute 3.4 (H) 0.7 - 3.1 x10E3/uL   Monocytes Absolute 0.7 0.1 - 0.9 x10E3/uL   EOS (ABSOLUTE) 0.1 0.0 - 0.4 x10E3/uL   Basophils Absolute 0.0 0.0 - 0.2 x10E3/uL   Immature Granulocytes 0 Not Estab. %   Immature Grans (Abs) 0.0 0.0 - 0.1 x10E3/uL      Assessment & Plan:   Problem List Items Addressed This Visit       Nervous and Auditory   Postherpetic neuralgia - Primary    Stopped her nortriptyline and started with pain again. Will restart it. Call with any concerns. Follow up as scheduled.       Other Visit Diagnoses     Needs flu shot       Relevant Orders   Flu Vaccine QUAD High Dose(Fluad) (Completed)        Follow up plan: Return as scheduled.

## 2022-05-09 NOTE — Assessment & Plan Note (Signed)
Stopped her nortriptyline and started with pain again. Will restart it. Call with any concerns. Follow up as scheduled.

## 2022-05-23 ENCOUNTER — Other Ambulatory Visit: Payer: Self-pay | Admitting: Family Medicine

## 2022-05-23 DIAGNOSIS — I10 Essential (primary) hypertension: Secondary | ICD-10-CM

## 2022-05-23 NOTE — Telephone Encounter (Signed)
Requested Prescriptions  Pending Prescriptions Disp Refills  . lisinopril (ZESTRIL) 40 MG tablet [Pharmacy Med Name: LISINOPRIL TAB '40MG'$ ] 135 tablet 1    Sig: TAKE 1 AND 1/2 TABLETS(60  MG TOTAL) DAILY     Cardiovascular:  ACE Inhibitors Failed - 05/23/2022  2:10 AM      Failed - Cr in normal range and within 180 days    Creatinine, Ser  Date Value Ref Range Status  03/01/2022 1.31 (H) 0.57 - 1.00 mg/dL Final         Failed - K in normal range and within 180 days    Potassium  Date Value Ref Range Status  03/01/2022 5.4 (H) 3.5 - 5.2 mmol/L Final         Passed - Patient is not pregnant      Passed - Last BP in normal range    BP Readings from Last 1 Encounters:  05/09/22 126/80         Passed - Valid encounter within last 6 months    Recent Outpatient Visits          2 weeks ago Postherpetic neuralgia   [place], Megan P, DO   1 month ago Postherpetic neuralgia   [employer] [facility], [place], DO   1 month ago Postherpetic neuralgia   [employer], [place], DO   2 months ago Herpes zoster without complication   [employer], Megan P, DO   2 months ago Elevated blood pressure reading with diagnosis of hypertension   [facility] Mecum, Dani [place], PA-C      Future Appointments            In 1 week Wynetta Emery, Barb [place], DO [facility], [facility]

## 2022-05-24 ENCOUNTER — Encounter: Payer: No Typology Code available for payment source | Admitting: Family Medicine

## 2022-05-30 ENCOUNTER — Encounter: Payer: Self-pay | Admitting: Family Medicine

## 2022-05-30 ENCOUNTER — Ambulatory Visit (INDEPENDENT_AMBULATORY_CARE_PROVIDER_SITE_OTHER): Payer: No Typology Code available for payment source | Admitting: Family Medicine

## 2022-05-30 VITALS — BP 98/62 | HR 67 | Temp 97.8°F | Ht 61.0 in | Wt 160.3 lb

## 2022-05-30 DIAGNOSIS — B0229 Other postherpetic nervous system involvement: Secondary | ICD-10-CM

## 2022-05-30 DIAGNOSIS — Z13820 Encounter for screening for osteoporosis: Secondary | ICD-10-CM | POA: Diagnosis not present

## 2022-05-30 DIAGNOSIS — E782 Mixed hyperlipidemia: Secondary | ICD-10-CM

## 2022-05-30 DIAGNOSIS — E1122 Type 2 diabetes mellitus with diabetic chronic kidney disease: Secondary | ICD-10-CM

## 2022-05-30 DIAGNOSIS — N1831 Chronic kidney disease, stage 3a: Secondary | ICD-10-CM

## 2022-05-30 DIAGNOSIS — E538 Deficiency of other specified B group vitamins: Secondary | ICD-10-CM

## 2022-05-30 DIAGNOSIS — E039 Hypothyroidism, unspecified: Secondary | ICD-10-CM | POA: Diagnosis not present

## 2022-05-30 DIAGNOSIS — I129 Hypertensive chronic kidney disease with stage 1 through stage 4 chronic kidney disease, or unspecified chronic kidney disease: Secondary | ICD-10-CM

## 2022-05-30 DIAGNOSIS — Z1231 Encounter for screening mammogram for malignant neoplasm of breast: Secondary | ICD-10-CM

## 2022-05-30 DIAGNOSIS — D692 Other nonthrombocytopenic purpura: Secondary | ICD-10-CM | POA: Diagnosis not present

## 2022-05-30 DIAGNOSIS — Z0000 Encounter for general adult medical examination without abnormal findings: Secondary | ICD-10-CM

## 2022-05-30 LAB — URINALYSIS, ROUTINE W REFLEX MICROSCOPIC
Bilirubin, UA: NEGATIVE
Glucose, UA: NEGATIVE
Ketones, UA: NEGATIVE
Leukocytes,UA: NEGATIVE
Nitrite, UA: NEGATIVE
RBC, UA: NEGATIVE
Specific Gravity, UA: 1.025 (ref 1.005–1.030)
Urobilinogen, Ur: 1 mg/dL (ref 0.2–1.0)
pH, UA: 5.5 (ref 5.0–7.5)

## 2022-05-30 LAB — MICROALBUMIN, URINE WAIVED
Creatinine, Urine Waived: 100 mg/dL (ref 10–300)
Microalb, Ur Waived: 80 mg/L — ABNORMAL HIGH (ref 0–19)

## 2022-05-30 LAB — MICROSCOPIC EXAMINATION
Bacteria, UA: NONE SEEN
Epithelial Cells (non renal): NONE SEEN /hpf (ref 0–10)

## 2022-05-30 LAB — BAYER DCA HB A1C WAIVED: HB A1C (BAYER DCA - WAIVED): 6.8 % — ABNORMAL HIGH (ref 4.8–5.6)

## 2022-05-30 MED ORDER — LISINOPRIL 30 MG PO TABS: 30.0000 mg | ORAL_TABLET | Freq: Every day | ORAL | 1 refills | Status: DC

## 2022-05-30 MED ORDER — ROSUVASTATIN CALCIUM 10 MG PO TABS: 10.0000 mg | ORAL_TABLET | Freq: Every day | ORAL | 1 refills | Status: DC

## 2022-05-30 MED ORDER — CHLORTHALIDONE 25 MG PO TABS: 25.0000 mg | ORAL_TABLET | Freq: Every day | ORAL | 1 refills | Status: DC

## 2022-05-30 NOTE — Assessment & Plan Note (Signed)
Rechecking labs today. Await results. Treat as needed.  °

## 2022-05-30 NOTE — Assessment & Plan Note (Signed)
Doing well with A1c of 6.8. Continue current regimen. Continue to monitor. Call with any concerns.

## 2022-05-30 NOTE — Assessment & Plan Note (Signed)
Under good control on current regimen. Continue current regimen. Continue to monitor. Call with any concerns. Refills given. Labs drawn today.   

## 2022-05-30 NOTE — Assessment & Plan Note (Signed)
Reassured patient. Continue to monitor.  

## 2022-05-30 NOTE — Progress Notes (Signed)
BP 98/62   Pulse 67   Temp 97.8 F (36.6 C)   Ht _0  (1.549 m)   Wt 160 lb 4.8 oz (72.7 kg)   LMP  (LMP Unknown)   SpO2 100%   BMI 30.29 kg/m    Subjective:    Patient ID: Teresa [name], female    DOB: September 05, 1945, 76 y.o.   MRN: [id number]  HPI: Teresa [name] is a 76 y.o. female presenting on 05/30/2022 for comprehensive medical examination. Current medical complaints include:  HYPERTENSION / HYPERLIPIDEMIA Satisfied with current treatment? yes Duration of hypertension: chronic BP monitoring frequency: a few times a week BP range: low BP medication side effects: yes- dizziness Past BP meds: lisinopril, chlorthalidone Duration of hyperlipidemia: chronic Cholesterol medication side effects: no Cholesterol supplements: none Past cholesterol medications: crestor Medication compliance: excellent compliance Aspirin: yes Recent stressors: no Recurrent headaches: no Visual changes: no Palpitations: no Dyspnea: no Chest pain: no Lower extremity edema: no Dizzy/lightheaded: no  HYPOTHYROIDISM Thyroid control status:controlled Satisfied with current treatment? yes Medication side effects: no Medication compliance: excellent compliance Etiology of hypothyroidism:  Recent dose adjustment:no Fatigue: no Cold intolerance: no Heat intolerance: no Weight gain: no Weight loss: no Constipation: no Diarrhea/loose stools: no Palpitations: no Lower extremity edema: no Anxiety/depressed mood: no  DIABETES Hypoglycemic episodes:no Polydipsia/polyuria: no Visual disturbance: no Chest pain: no Paresthesias: no Glucose Monitoring: no  Accucheck frequency: Not Checking Taking Insulin?: no Blood Pressure Monitoring: a few times a week Retinal Examination: Up to Date Foot Exam: Up to Date Diabetic Education: Completed Pneumovax: Up to Date Influenza: Up to Date Aspirin: yes   She currently lives with: husband Menopausal Symptoms: no  Depression Screen  done today and results listed below:     03/22/2022   11:22 AM 03/20/2022    9:48 AM 03/01/2022    2:02 PM 12/18/2021    9:31 AM 03/23/2021    9:57 AM  Depression screen PHQ 2/9  Decreased Interest 0 0 0 2 0  Down, Depressed, Hopeless 0 0 0 0 0  PHQ - 2 Score 0 0 0 2 0  Altered sleeping 1 0 0 1   Tired, decreased energy _1 Change in appetite 0 1 0 2   Feeling bad or failure about yourself  0 0 0 0   Trouble concentrating 0 0 0 2   Moving slowly or fidgety/restless 0 0 0 0   Suicidal thoughts 0 0 0 0   PHQ-9 Score _2 Difficult doing work/chores Not difficult at all Not difficult at all Not difficult at all Not difficult at all     Past Medical History:  Past Medical History:  Diagnosis Date   Diabetes mellitus without complication ([place])    Heart murmur    Hyperlipidemia    Hypertension    Hypothyroidism    Lichen sclerosus    Obesity    Osteopenia    Stroke [facility])    Thyroid disease     Surgical History:  Past Surgical History:  Procedure Laterality Date   carpel tunnel     COLONOSCOPY WITH PROPOFOL N/A 08/30/2020   Procedure: COLONOSCOPY WITH PROPOFOL;  Surgeon: Virgel Manifold, MD;  Location: [facility] ENDOSCOPY;  Service: Gastroenterology;  Laterality: N/A;   ROTATOR CUFF REPAIR Left    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      Medications:  Current Outpatient Medications on File Prior to Visit  Medication Sig  ASPIRIN 81 PO Take by mouth daily.   blood glucose meter kit and supplies Dispense based on patient and insurance preference. Use up to four times daily as directed. (FOR ICD-9 250.00, 250.01).   Blood Glucose Monitoring Suppl (ONETOUCH VERIO) w/Device KIT 1 each by Does not apply route daily.   clobetasol ointment (TEMOVATE) 0.05 % APPLY  OINTMENT TOPICALLY TWICE DAILY   clopidogrel (PLAVIX) 75 MG tablet Take 1 tablet (75 mg total) by mouth daily.   folic acid (FOLVITE) 416 MCG tablet Take 400 mcg by mouth daily.   glucose blood (ONETOUCH VERIO) test  strip Use as instructed   glucose blood test strip 1 each by Other route 2 (two) times daily. Use as instructed, Dx: E11.22   levothyroxine (SYNTHROID) 50 MCG tablet Take 1 tablet (50 mcg total) by mouth daily.   metFORMIN (GLUCOPHAGE-XR) 500 MG 24 hr tablet TAKE 2 TABLETS TWO TIMES A DAY   nortriptyline (PAMELOR) 10 MG capsule Take 1-2 capsules (10-20 mg total) by mouth at bedtime.   ondansetron (ZOFRAN) 4 MG tablet Take 1 tablet (4 mg total) by mouth every 8 (eight) hours as needed for nausea or vomiting.   OneTouch Delica Lancets 38G MISC 1 each by Does not apply route daily.   vitamin B-12 (CYANOCOBALAMIN) 1000 MCG tablet Take 1,000 mcg by mouth daily.   valACYclovir (VALTREX) 1000 MG tablet Take 1,000 mg by mouth 3 (three) times daily. (Patient not taking: Reported on 05/30/2022)   No current facility-administered medications on file prior to visit.    Allergies:  Allergies  Allergen Reactions   Amlodipine Other (See Comments)    Ankle swelling   Atorvastatin     cramps   Evista [Raloxifene] Nausea Only    Social History:  Social History   Socioeconomic History   Marital status: Married    Spouse name: Not on file   Number of children: Not on file   Years of education: Not on file   Highest education level: High school graduate  Occupational History   Occupation: retired  Tobacco Use   Smoking status: Former    Packs/day: 2.00    Types: Cigarettes    Quit date: 01/1965    Years since quitting: 57.4   Smokeless tobacco: Never  Vaping Use   Vaping Use: Never used  Substance and Sexual Activity   Alcohol use: No    Alcohol/week: 0.0 standard drinks of alcohol   Drug use: No   Sexual activity: Never  Other Topics Concern   Not on file  Social History Narrative   Not on file   Social Determinants of Health   Financial Resource Strain: Low Risk  (03/20/2022)   Overall Financial Resource Strain (CARDIA)    Difficulty of Paying Living Expenses: Not hard at all   Food Insecurity: No Food Insecurity (03/20/2022)   Hunger Vital Sign    Worried About Running Out of Food in the Last Year: Never true    [place] in the Last Year: Never true  Transportation Needs: No Transportation Needs (03/20/2022)   PRAPARE - Hydrologist (Medical): No    Lack of Transportation (Non-Medical): No  Physical Activity: Inactive (03/20/2022)   Exercise Vital Sign    Days of Exercise per Week: 0 days    Minutes of Exercise per Session: 0 min  Stress: No Stress Concern Present (03/20/2022)   [place]  of Stress : Only a little  Social Connections: Socially Integrated (03/20/2022)   Social Connection and Isolation Panel [NHANES]    Frequency of Communication with Friends and Family: More than three times a week    Frequency of Social Gatherings with Friends and Family: Twice a week    Attends Religious Services: More than 4 times per year    Active Member of [employer] or Organizations: Yes    Attends Music therapist: More than 4 times per year    Marital Status: Married  Human resources officer Violence: Not At Risk (03/20/2022)   Humiliation, Afraid, Rape, and Kick questionnaire    Fear of Current or Ex-Partner: No    Emotionally Abused: No    Physically Abused: No    Sexually Abused: No   Social History   Tobacco Use  Smoking Status Former   Packs/day: 2.00   Types: Cigarettes   Quit date: 01/1965   Years since quitting: 57.4  Smokeless Tobacco Never   Social History   Substance and Sexual Activity  Alcohol Use No   Alcohol/week: 0.0 standard drinks of alcohol    Family History:  Family History  Problem Relation Age of Onset   Hypertension Mother    Thyroid disease Mother    Glaucoma Mother    Coronary artery disease Mother    Stroke Father    Diabetes Brother    Hypertension Maternal Grandfather    Breast cancer Neg Hx     Past medical  history, surgical history, medications, allergies, family history and social history reviewed with patient today and changes made to appropriate areas of the chart.   Review of Systems  Constitutional: Negative.   HENT: Negative.    Eyes: Negative.   Respiratory: Negative.    Cardiovascular: Negative.   Gastrointestinal:  Positive for diarrhea. Negative for abdominal pain, blood in stool, constipation, heartburn, melena, nausea and vomiting.  Genitourinary: Negative.   Musculoskeletal: Negative.   Skin: Negative.   Neurological: Negative.   Endo/Heme/Allergies:  Negative for environmental allergies and polydipsia. Bruises/bleeds easily.  Psychiatric/Behavioral: Negative.     All other ROS negative except what is listed above and in the HPI.      Objective:    BP 98/62   Pulse 67   Temp 97.8 F (36.6 C)   Ht _0  (1.549 m)   Wt 160 lb 4.8 oz (72.7 kg)   LMP  (LMP Unknown)   SpO2 100%   BMI 30.29 kg/m   Wt Readings from Last 3 Encounters:  05/30/22 160 lb 4.8 oz (72.7 kg)  05/09/22 162 lb (73.5 kg)  04/19/22 160 lb 8 oz (72.8 kg)    Physical Exam Vitals and nursing note reviewed.  Constitutional:      General: She is not in acute distress.    Appearance: Normal appearance. She is normal weight. She is not ill-appearing, toxic-appearing or diaphoretic.  HENT:     Head: Normocephalic and atraumatic.     Right Ear: Tympanic membrane, ear canal and external ear normal. There is no impacted cerumen.     Left Ear: Tympanic membrane, ear canal and external ear normal. There is no impacted cerumen.     Nose: Nose normal. No congestion or rhinorrhea.     Mouth/Throat:     Mouth: Mucous membranes are moist.     Pharynx: Oropharynx is clear. No oropharyngeal exudate or posterior oropharyngeal erythema.  Eyes:     General: No scleral icterus.  Right eye: No discharge.        Left eye: No discharge.     Extraocular Movements: Extraocular movements intact.      Conjunctiva/sclera: Conjunctivae normal.     Pupils: Pupils are equal, round, and reactive to light.  Neck:     Vascular: No carotid bruit.  Cardiovascular:     Rate and Rhythm: Normal rate and regular rhythm.     Pulses: Normal pulses.     Heart sounds: No murmur heard.    No friction rub. No gallop.  Pulmonary:     Effort: Pulmonary effort is normal. No respiratory distress.     Breath sounds: Normal breath sounds. No stridor. No wheezing, rhonchi or rales.  Chest:     Chest wall: No tenderness.  Abdominal:     General: Abdomen is flat. Bowel sounds are normal. There is no distension.     Palpations: Abdomen is soft. There is no mass.     Tenderness: There is no abdominal tenderness. There is no right CVA tenderness, left CVA tenderness, guarding or rebound.     Hernia: No hernia is present.  Genitourinary:    Comments: Breast and pelvic exams deferred with shared decision making Musculoskeletal:        General: No swelling, tenderness, deformity or signs of injury.     Cervical back: Normal range of motion and neck supple. No rigidity. No muscular tenderness.     Right lower leg: No edema.     Left lower leg: No edema.  Lymphadenopathy:     Cervical: No cervical adenopathy.  Skin:    General: Skin is warm and dry.     Capillary Refill: Capillary refill takes less than 2 seconds.     Coloration: Skin is not jaundiced or pale.     Findings: No bruising, erythema, lesion or rash.  Neurological:     General: No focal deficit present.     Mental Status: She is alert and oriented to person, place, and time. Mental status is at baseline.     Cranial Nerves: No cranial nerve deficit.     Sensory: No sensory deficit.     Motor: No weakness.     Coordination: Coordination normal.     Gait: Gait normal.     Deep Tendon Reflexes: Reflexes normal.  Psychiatric:        Mood and Affect: Mood normal.        Behavior: Behavior normal.        Thought Content: Thought content normal.         Judgment: Judgment normal.     Results for orders placed or performed in visit on 05/30/22  Microscopic Examination   BLD  Result Value Ref Range   WBC, UA 0-5 0 - 5 /hpf   RBC, Urine 0-2 0 - 2 /hpf   Epithelial Cells (non renal) None seen 0 - 10 /hpf   Bacteria, UA None seen None seen/Few  Urinalysis, Routine w reflex microscopic  Result Value Ref Range   Specific Gravity, UA 1.025 1.005 - 1.030   pH, UA 5.5 5.0 - 7.5   Color, UA Yellow Yellow   Appearance Ur Clear Clear   Leukocytes,UA Negative Negative   Protein,UA 1+ (A) Negative/Trace   Glucose, UA Negative Negative   Ketones, UA Negative Negative   RBC, UA Negative Negative   Bilirubin, UA Negative Negative   Urobilinogen, Ur 1.0 0.2 - 1.0 mg/dL   Nitrite, UA Negative Negative  Microscopic Examination See below:   Bayer DCA Hb A1c Waived  Result Value Ref Range   HB A1C (BAYER DCA - WAIVED) 6.8 (H) 4.8 - 5.6 %  Microalbumin, Urine Waived  Result Value Ref Range   Microalb, Ur Waived 80 (H) 0 - 19 mg/L   Creatinine, Urine Waived 100 10 - 300 mg/dL   Microalb/Creat Ratio 30-300 (H) <30 mg/g      Assessment & Plan:   Problem List Items Addressed This Visit       Cardiovascular and Mediastinum   Senile purpura ([place])    Reassured patient. Continue to monitor.       Relevant Medications   lisinopril (ZESTRIL) 30 MG tablet   chlorthalidone (HYGROTON) 25 MG tablet   rosuvastatin (CRESTOR) 10 MG tablet   Other Relevant Orders   CBC with Differential/Platelet   Comprehensive metabolic panel     Endocrine   Hypothyroidism    Rechecking labs today. Await results. Treat as needed.       Relevant Orders   CBC with Differential/Platelet   Comprehensive metabolic panel   TSH   Type 2 diabetes mellitus with stage 3 chronic kidney disease, without long-term current use of insulin ([place])    Doing well with A1c of 6.8. Continue current regimen. Continue to monitor. Call with any concerns.       Relevant  Medications   lisinopril (ZESTRIL) 30 MG tablet   rosuvastatin (CRESTOR) 10 MG tablet   Other Relevant Orders   CBC with Differential/Platelet   Comprehensive metabolic panel   Urinalysis, Routine w reflex microscopic (Completed)   Bayer DCA Hb A1c Waived (Completed)   Microalbumin, Urine Waived (Completed)     Nervous and Auditory   Postherpetic neuralgia    Under good control on current regimen. Continue current regimen. Continue to monitor. Call with any concerns. Refills given.          Genitourinary   Hypertensive CKD (chronic kidney disease)    BP running low- will cut lisinopril to 6m and recheck 1 month. Call with any concerns.       Relevant Medications   lisinopril (ZESTRIL) 30 MG tablet   Other Relevant Orders   CBC with Differential/Platelet   Comprehensive metabolic panel   [place]D (chronic [place] disease), stage III [place]    Rechecking labs today. Await results. Treat as needed.       Relevant Orders   CBC with Differential/Platelet   Comprehensive metabolic panel     Other   Hyperlipidemia    Under good control on current regimen. Continue current regimen. Continue to monitor. Call with any concerns. Refills given. Labs drawn today.       Relevant Medications   lisinopril (ZESTRIL) 30 MG tablet   chlorthalidone (HYGROTON) 25 MG tablet   rosuvastatin (CRESTOR) 10 MG tablet   Other Relevant Orders   CBC with Differential/Platelet   Comprehensive metabolic panel   Lipid Panel w/o Chol/HDL Ratio   B12 deficiency    Rechecking labs today. Await results. Treat as needed.       Relevant Orders   CBC with Differential/Platelet   Comprehensive metabolic panel   BG83  Other Visit Diagnoses     Routine general medical examination at a health care facility    -  Primary   Vaccines up to date. Screening labs checked today. Mammo and DEXA ordered. Pap and colonoscopy N/A. Continue diet and exercise. Call with any concerns.    Encounter for screening  mammogram for malignant neoplasm of breast       Mammo ordered today.   Relevant Orders   MM 3D SCREEN BREAST BILATERAL   Screening for osteoporosis       DEXA ordered today.   Relevant Orders   DG Bone Density        Follow up plan: Return in about 4 weeks (around 06/27/2022).   LABORATORY TESTING:  - Pap smear: not applicable  IMMUNIZATIONS:   - Tdap: Tetanus vaccination status reviewed: last tetanus booster within 10 years. - Influenza: Up to date - Pneumovax: Up to date - Prevnar: Up to date - COVID: Up to date - HPV: Not applicable - Shingrix vaccine: Given elsewhere  SCREENING: -Mammogram: Ordered today  - Colonoscopy: Not applicable  - Bone Density: Ordered today   PATIENT COUNSELING:   Advised to take 1 mg of folate supplement per day if capable of pregnancy.   Sexuality: Discussed sexually transmitted diseases, partner selection, use of condoms, avoidance of unintended pregnancy  and contraceptive alternatives.   Advised to avoid cigarette smoking.  I discussed with the patient that most people either abstain from alcohol or drink within safe limits (<=14/week and <=4 drinks/occasion for males, <=7/weeks and <= 3 drinks/occasion for females) and that the risk for alcohol disorders and other health effects rises proportionally with the number of drinks per week and how often a drinker exceeds daily limits.  Discussed cessation/primary prevention of drug use and availability of treatment for abuse.   Diet: Encouraged to adjust caloric intake to maintain  or achieve ideal body weight, to reduce intake of dietary saturated fat and total fat, to limit sodium intake by avoiding high sodium foods and not adding table salt, and to maintain adequate dietary potassium and calcium preferably from fresh fruits, vegetables, and low-fat dairy products.    stressed the importance of regular exercise  Injury prevention: Discussed safety belts, safety helmets, smoke detector,  smoking near bedding or upholstery.   Dental health: Discussed importance of regular tooth brushing, flossing, and dental visits.    NEXT PREVENTATIVE PHYSICAL DUE IN 1 YEAR. Return in about 4 weeks (around 06/27/2022).

## 2022-05-30 NOTE — Assessment & Plan Note (Signed)
Under good control on current regimen. Continue current regimen. Continue to monitor. Call with any concerns. Refills given.   

## 2022-05-30 NOTE — Patient Instructions (Signed)
Please call to schedule your mammogram and/or bone density: [facility]  Address: [address], , [place] [address] Phone: [phone]  [facility] [place] [address] [place],  [place]  [address] Phone: [phone]   

## 2022-05-30 NOTE — Assessment & Plan Note (Signed)
BP running low- will cut lisinopril to '30mg'$  and recheck 1 month. Call with any concerns.

## 2022-05-31 LAB — COMPREHENSIVE METABOLIC PANEL
ALT: 15 IU/L (ref 0–32)
AST: 16 IU/L (ref 0–40)
Albumin/Globulin Ratio: 1.8 (ref 1.2–2.2)
Albumin: 4.4 g/dL (ref 3.8–4.8)
Alkaline Phosphatase: 78 IU/L (ref 44–121)
BUN/Creatinine Ratio: 22 (ref 12–28)
BUN: 33 mg/dL — ABNORMAL HIGH (ref 8–27)
Bilirubin Total: <0.2 mg/dL (ref 0.0–1.2)
CO2: 22 mmol/L (ref 20–29)
Calcium: 9.7 mg/dL (ref 8.7–10.3)
Chloride: 109 mmol/L — ABNORMAL HIGH (ref 96–106)
Creatinine, Ser: 1.49 mg/dL — ABNORMAL HIGH (ref 0.57–1.00)
Globulin, Total: 2.5 g/dL (ref 1.5–4.5)
Glucose: 101 mg/dL — ABNORMAL HIGH (ref 70–99)
Potassium: 5 mmol/L (ref 3.5–5.2)
Sodium: 144 mmol/L (ref 134–144)
Total Protein: 6.9 g/dL (ref 6.0–8.5)
eGFR: 36 mL/min/{1.73_m2} — ABNORMAL LOW (ref >59)

## 2022-05-31 LAB — CBC WITH DIFFERENTIAL/PLATELET
Basophils Absolute: 0 10*3/uL (ref 0.0–0.2)
Basos: 0 %
EOS (ABSOLUTE): 0.1 10*3/uL (ref 0.0–0.4)
Eos: 1 %
Hematocrit: 30.6 % — ABNORMAL LOW (ref 34.0–46.6)
Hemoglobin: 10.2 g/dL — ABNORMAL LOW (ref 11.1–15.9)
Immature Grans (Abs): 0 10*3/uL (ref 0.0–0.1)
Immature Granulocytes: 0 %
Lymphocytes Absolute: 2.4 10*3/uL (ref 0.7–3.1)
Lymphs: 31 %
MCH: 31.5 pg (ref 26.6–33.0)
MCHC: 33.3 g/dL (ref 31.5–35.7)
MCV: 94 fL (ref 79–97)
Monocytes Absolute: 0.7 10*3/uL (ref 0.1–0.9)
Monocytes: 9 %
Neutrophils Absolute: 4.7 10*3/uL (ref 1.4–7.0)
Neutrophils: 59 %
Platelets: 283 10*3/uL (ref 150–450)
RBC: 3.24 x10E6/uL — ABNORMAL LOW (ref 3.77–5.28)
RDW: 13.4 % (ref 11.7–15.4)
WBC: 7.9 10*3/uL (ref 3.4–10.8)

## 2022-05-31 LAB — LIPID PANEL W/O CHOL/HDL RATIO
Cholesterol, Total: 125 mg/dL (ref 100–199)
HDL: 50 mg/dL (ref >39)
LDL Chol Calc (NIH): 57 mg/dL (ref 0–99)
Triglycerides: 93 mg/dL (ref 0–149)
VLDL Cholesterol Cal: 18 mg/dL (ref 5–40)

## 2022-05-31 LAB — TSH: TSH: 1.38 u[IU]/mL (ref 0.450–4.500)

## 2022-05-31 LAB — VITAMIN B12: Vitamin B-12: 351 pg/mL (ref 232–1245)

## 2022-06-27 ENCOUNTER — Ambulatory Visit (INDEPENDENT_AMBULATORY_CARE_PROVIDER_SITE_OTHER): Payer: No Typology Code available for payment source | Admitting: Family Medicine

## 2022-06-27 ENCOUNTER — Encounter: Payer: Self-pay | Admitting: Family Medicine

## 2022-06-27 VITALS — BP 119/69 | HR 58 | Temp 97.9°F | Ht 61.0 in | Wt 160.6 lb

## 2022-06-27 DIAGNOSIS — I129 Hypertensive chronic kidney disease with stage 1 through stage 4 chronic kidney disease, or unspecified chronic kidney disease: Secondary | ICD-10-CM

## 2022-06-27 DIAGNOSIS — N1831 Chronic kidney disease, stage 3a: Secondary | ICD-10-CM | POA: Diagnosis not present

## 2022-06-27 NOTE — Assessment & Plan Note (Signed)
Will cut her lisinopril to '30mg'$  and recheck 1 month. Call with any concerns.

## 2022-06-27 NOTE — Progress Notes (Signed)
BP 119/69   Pulse (!) 58   Temp 97.9 F (36.6 C) (Oral)   Ht _0  (1.549 m)   Wt 160 lb 9.6 oz (72.8 kg)   LMP  (LMP Unknown)   SpO2 99%   BMI 30.35 kg/m    Subjective:    Patient ID: Teresa [name], female    DOB: [birth year], 75 y.o.   MRN: [id number]  HPI: Teresa [name] is a 76 y.o. female  Chief Complaint  Patient presents with   Hypertension   HYPERTENSION- did not cut her lisinopril down. Has still been taking 40m to "use it up"  Hypertension status: better  Satisfied with current treatment?  unsure Duration of hypertension: chronic BP monitoring frequency:  not checking BP medication side effects:  no Medication compliance: fair compliance Previous BP meds: lisinopril, chlorthalidone Aspirin: yes Recurrent headaches: no Visual changes: no Palpitations: no Dyspnea: no Chest pain: no Lower extremity edema: no Dizzy/lightheaded: yes  Relevant past medical, surgical, family and social history reviewed and updated as indicated. Interim medical history since our last visit reviewed. Allergies and medications reviewed and updated.  Review of Systems  Constitutional: Negative.   Respiratory: Negative.    Cardiovascular: Negative.   Gastrointestinal: Negative.   Musculoskeletal: Negative.   Neurological:  Positive for dizziness and light-headedness. Negative for tremors, seizures, syncope, facial asymmetry, speech difficulty, weakness, numbness and headaches.  Psychiatric/Behavioral: Negative.      Per HPI unless specifically indicated above     Objective:    BP 119/69   Pulse (!) 58   Temp 97.9 F (36.6 C) (Oral)   Ht _1  (1.549 m)   Wt 160 lb 9.6 oz (72.8 kg)   LMP  (LMP Unknown)   SpO2 99%   BMI 30.35 kg/m   Wt Readings from Last 3 Encounters:  06/27/22 160 lb 9.6 oz (72.8 kg)  05/30/22 160 lb 4.8 oz (72.7 kg)  05/09/22 162 lb (73.5 kg)    Physical Exam Vitals and nursing note reviewed.  Constitutional:      General: She is not  in acute distress.    Appearance: Normal appearance. She is not ill-appearing, toxic-appearing or diaphoretic.  HENT:     Head: Normocephalic and atraumatic.     Right Ear: External ear normal.     Left Ear: External ear normal.     Nose: Nose normal.     Mouth/Throat:     Mouth: Mucous membranes are moist.     Pharynx: Oropharynx is clear.  Eyes:     General: No scleral icterus.       Right eye: No discharge.        Left eye: No discharge.     Extraocular Movements: Extraocular movements intact.     Conjunctiva/sclera: Conjunctivae normal.     Pupils: Pupils are equal, round, and reactive to light.  Cardiovascular:     Rate and Rhythm: Normal rate and regular rhythm.     Pulses: Normal pulses.     Heart sounds: Normal heart sounds. No murmur heard.    No friction rub. No gallop.  Pulmonary:     Effort: Pulmonary effort is normal. No respiratory distress.     Breath sounds: Normal breath sounds. No stridor. No wheezing, rhonchi or rales.  Chest:     Chest wall: No tenderness.  Musculoskeletal:        General: Normal range of motion.     Cervical back: Normal range of motion and neck  supple.  Skin:    General: Skin is warm and dry.     Capillary Refill: Capillary refill takes less than 2 seconds.     Coloration: Skin is not jaundiced or pale.     Findings: No bruising, erythema, lesion or rash.  Neurological:     General: No focal deficit present.     Mental Status: She is alert and oriented to person, place, and time. Mental status is at baseline.  Psychiatric:        Mood and Affect: Mood normal.        Behavior: Behavior normal.        Thought Content: Thought content normal.        Judgment: Judgment normal.     Results for orders placed or performed in visit on 05/30/22  Microscopic Examination   BLD  Result Value Ref Range   WBC, UA 0-5 0 - 5 /hpf   RBC, Urine 0-2 0 - 2 /hpf   Epithelial Cells (non renal) None seen 0 - 10 /hpf   Bacteria, UA None seen None  seen/Few  CBC with Differential/Platelet  Result Value Ref Range   WBC 7.9 3.4 - 10.8 x10E3/uL   RBC 3.24 (L) 3.77 - 5.28 x10E6/uL   Hemoglobin 10.2 (L) 11.1 - 15.9 g/dL   Hematocrit 30.6 (L) 34.0 - 46.6 %   MCV 94 79 - 97 fL   MCH 31.5 26.6 - 33.0 pg   MCHC 33.3 31.5 - 35.7 g/dL   RDW 13.4 11.7 - 15.4 %   Platelets 283 150 - 450 x10E3/uL   Neutrophils 59 Not Estab. %   Lymphs 31 Not Estab. %   Monocytes 9 Not Estab. %   Eos 1 Not Estab. %   Basos 0 Not Estab. %   Neutrophils Absolute 4.7 1.4 - 7.0 x10E3/uL   Lymphocytes Absolute 2.4 0.7 - 3.1 x10E3/uL   Monocytes Absolute 0.7 0.1 - 0.9 x10E3/uL   EOS (ABSOLUTE) 0.1 0.0 - 0.4 x10E3/uL   Basophils Absolute 0.0 0.0 - 0.2 x10E3/uL   Immature Granulocytes 0 Not Estab. %   Immature Grans (Abs) 0.0 0.0 - 0.1 x10E3/uL  Comprehensive metabolic panel  Result Value Ref Range   Glucose 101 (H) 70 - 99 mg/dL   BUN 33 (H) 8 - 27 mg/dL   Creatinine, Ser 1.49 (H) 0.57 - 1.00 mg/dL   eGFR 36 (L) >59 mL/min/1.73   BUN/Creatinine Ratio 22 12 - 28   Sodium 144 134 - 144 mmol/L   Potassium 5.0 3.5 - 5.2 mmol/L   Chloride 109 (H) 96 - 106 mmol/L   CO2 22 20 - 29 mmol/L   Calcium 9.7 8.7 - 10.3 mg/dL   Total Protein 6.9 6.0 - 8.5 g/dL   Albumin 4.4 3.8 - 4.8 g/dL   Globulin, Total 2.5 1.5 - 4.5 g/dL   Albumin/Globulin Ratio 1.8 1.2 - 2.2   Bilirubin Total <0.2 0.0 - 1.2 mg/dL   Alkaline Phosphatase 78 44 - 121 IU/L   AST 16 0 - 40 IU/L   ALT 15 0 - 32 IU/L  Lipid Panel w/o Chol/HDL Ratio  Result Value Ref Range   Cholesterol, Total 125 100 - 199 mg/dL   Triglycerides 93 0 - 149 mg/dL   HDL 50 >39 mg/dL   VLDL Cholesterol Cal 18 5 - 40 mg/dL   LDL Chol Calc (NIH) 57 0 - 99 mg/dL  Urinalysis, Routine w reflex microscopic  Result Value Ref Range  Specific Gravity, UA 1.025 1.005 - 1.030   pH, UA 5.5 5.0 - 7.5   Color, UA Yellow Yellow   Appearance Ur Clear Clear   Leukocytes,UA Negative Negative   Protein,UA 1+ (A) Negative/Trace    Glucose, UA Negative Negative   Ketones, UA Negative Negative   RBC, UA Negative Negative   Bilirubin, UA Negative Negative   Urobilinogen, Ur 1.0 0.2 - 1.0 mg/dL   Nitrite, UA Negative Negative   Microscopic Examination See below:   TSH  Result Value Ref Range   TSH 1.380 0.450 - 4.500 uIU/mL  Bayer DCA Hb A1c Waived  Result Value Ref Range   HB A1C (BAYER DCA - WAIVED) 6.8 (H) 4.8 - 5.6 %  Microalbumin, Urine Waived  Result Value Ref Range   Microalb, Ur Waived 80 (H) 0 - 19 mg/L   Creatinine, Urine Waived 100 10 - 300 mg/dL   Microalb/Creat Ratio 30-300 (H) <30 mg/g  B12  Result Value Ref Range   Vitamin B-12 351 232 - 1,245 pg/mL      Assessment & Plan:   Problem List Items Addressed This Visit       Genitourinary   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 Call with any concerns.         Follow up plan: Return in about 4 weeks (around 07/25/2022).

## 2022-07-25 ENCOUNTER — Ambulatory Visit (INDEPENDENT_AMBULATORY_CARE_PROVIDER_SITE_OTHER): Payer: No Typology Code available for payment source | Admitting: Family Medicine

## 2022-07-25 ENCOUNTER — Encounter: Payer: Self-pay | Admitting: Family Medicine

## 2022-07-25 VITALS — BP 123/73 | HR 52 | Temp 97.8°F | Ht 61.0 in | Wt 160.9 lb

## 2022-07-25 DIAGNOSIS — I129 Hypertensive chronic kidney disease with stage 1 through stage 4 chronic kidney disease, or unspecified chronic kidney disease: Secondary | ICD-10-CM | POA: Diagnosis not present

## 2022-07-25 DIAGNOSIS — N1831 Chronic kidney disease, stage 3a: Secondary | ICD-10-CM

## 2022-07-25 NOTE — Assessment & Plan Note (Signed)
Doing much better. Continue current regimen. Call with any concerns. Refills up to date. Call with any concerns.

## 2022-07-25 NOTE — Progress Notes (Signed)
BP 123/73   Pulse (!) 52   Temp 97.8 F (36.6 C) (Oral)   Ht _0  (1.549 m)   Wt 160 lb 14.4 oz (73 kg)   LMP  (LMP Unknown)   SpO2 100%   BMI 30.40 kg/m    Subjective:    Patient ID: Teresa [name], female    DOB: 13-Jun-1946, 76 y.o.   MRN: [id number]  HPI: ROSHELL BRIGHAM is a 76 y.o. female  Chief Complaint  Patient presents with   Hypertension   HYPERTENSION  Hypertension status: better  Satisfied with current treatment? yes Duration of hypertension: chronic BP monitoring frequency:  not checking BP medication side effects:  no Medication compliance: excellent compliance Previous BP meds: chlorthalidone, lisinopril Aspirin: no Recurrent headaches: no Visual changes: no Palpitations: no Dyspnea: no Chest pain: no Lower extremity edema: no Dizzy/lightheaded: no  Relevant past medical, surgical, family and social history reviewed and updated as indicated. Interim medical history since our last visit reviewed. Allergies and medications reviewed and updated.  Review of Systems  Constitutional: Negative.   Respiratory: Negative.    Cardiovascular: Negative.   Gastrointestinal: Negative.   Musculoskeletal: Negative.   Neurological: Negative.   Psychiatric/Behavioral: Negative.      Per HPI unless specifically indicated above     Objective:    BP 123/73   Pulse (!) 52   Temp 97.8 F (36.6 C) (Oral)   Ht _1  (1.549 m)   Wt 160 lb 14.4 oz (73 kg)   LMP  (LMP Unknown)   SpO2 100%   BMI 30.40 kg/m   Wt Readings from Last 3 Encounters:  07/25/22 160 lb 14.4 oz (73 kg)  06/27/22 160 lb 9.6 oz (72.8 kg)  05/30/22 160 lb 4.8 oz (72.7 kg)    Physical Exam Vitals and nursing note reviewed.  Constitutional:      General: She is not in acute distress.    Appearance: Normal appearance. She is not ill-appearing, toxic-appearing or diaphoretic.  HENT:     Head: Normocephalic and atraumatic.     Right Ear: External ear normal.     Left Ear:  External ear normal.     Nose: Nose normal.     Mouth/Throat:     Mouth: Mucous membranes are moist.     Pharynx: Oropharynx is clear.  Eyes:     General: No scleral icterus.       Right eye: No discharge.        Left eye: No discharge.     Extraocular Movements: Extraocular movements intact.     Conjunctiva/sclera: Conjunctivae normal.     Pupils: Pupils are equal, round, and reactive to light.  Cardiovascular:     Rate and Rhythm: Normal rate and regular rhythm.     Pulses: Normal pulses.     Heart sounds: Normal heart sounds. No murmur heard.    No friction rub. No gallop.  Pulmonary:     Effort: Pulmonary effort is normal. No respiratory distress.     Breath sounds: Normal breath sounds. No stridor. No wheezing, rhonchi or rales.  Chest:     Chest wall: No tenderness.  Musculoskeletal:        General: Normal range of motion.     Cervical back: Normal range of motion and neck supple.  Skin:    General: Skin is warm and dry.     Capillary Refill: Capillary refill takes less than 2 seconds.     Coloration: Skin  is not jaundiced or pale.     Findings: No bruising, erythema, lesion or rash.  Neurological:     General: No focal deficit present.     Mental Status: She is alert and oriented to person, place, and time. Mental status is at baseline.  Psychiatric:        Mood and Affect: Mood normal.        Behavior: Behavior normal.        Thought Content: Thought content normal.        Judgment: Judgment normal.     Results for orders placed or performed in visit on 05/30/22  Microscopic Examination   BLD  Result Value Ref Range   WBC, UA 0-5 0 - 5 /hpf   RBC, Urine 0-2 0 - 2 /hpf   Epithelial Cells (non renal) None seen 0 - 10 /hpf   Bacteria, UA None seen None seen/Few  CBC with Differential/Platelet  Result Value Ref Range   WBC 7.9 3.4 - 10.8 x10E3/uL   RBC 3.24 (L) 3.77 - 5.28 x10E6/uL   Hemoglobin 10.2 (L) 11.1 - 15.9 g/dL   Hematocrit 30.6 (L) 34.0 - 46.6 %    MCV 94 79 - 97 fL   MCH 31.5 26.6 - 33.0 pg   MCHC 33.3 31.5 - 35.7 g/dL   RDW 13.4 11.7 - 15.4 %   Platelets 283 150 - 450 x10E3/uL   Neutrophils 59 Not Estab. %   Lymphs 31 Not Estab. %   Monocytes 9 Not Estab. %   Eos 1 Not Estab. %   Basos 0 Not Estab. %   Neutrophils Absolute 4.7 1.4 - 7.0 x10E3/uL   Lymphocytes Absolute 2.4 0.7 - 3.1 x10E3/uL   Monocytes Absolute 0.7 0.1 - 0.9 x10E3/uL   EOS (ABSOLUTE) 0.1 0.0 - 0.4 x10E3/uL   Basophils Absolute 0.0 0.0 - 0.2 x10E3/uL   Immature Granulocytes 0 Not Estab. %   Immature Grans (Abs) 0.0 0.0 - 0.1 x10E3/uL  Comprehensive metabolic panel  Result Value Ref Range   Glucose 101 (H) 70 - 99 mg/dL   BUN 33 (H) 8 - 27 mg/dL   Creatinine, Ser 1.49 (H) 0.57 - 1.00 mg/dL   eGFR 36 (L) >59 mL/min/1.73   BUN/Creatinine Ratio 22 12 - 28   Sodium 144 134 - 144 mmol/L   Potassium 5.0 3.5 - 5.2 mmol/L   Chloride 109 (H) 96 - 106 mmol/L   CO2 22 20 - 29 mmol/L   Calcium 9.7 8.7 - 10.3 mg/dL   Total Protein 6.9 6.0 - 8.5 g/dL   Albumin 4.4 3.8 - 4.8 g/dL   Globulin, Total 2.5 1.5 - 4.5 g/dL   Albumin/Globulin Ratio 1.8 1.2 - 2.2   Bilirubin Total <0.2 0.0 - 1.2 mg/dL   Alkaline Phosphatase 78 44 - 121 IU/L   AST 16 0 - 40 IU/L   ALT 15 0 - 32 IU/L  Lipid Panel w/o Chol/HDL Ratio  Result Value Ref Range   Cholesterol, Total 125 100 - 199 mg/dL   Triglycerides 93 0 - 149 mg/dL   HDL 50 >39 mg/dL   VLDL Cholesterol Cal 18 5 - 40 mg/dL   LDL Chol Calc (NIH) 57 0 - 99 mg/dL  Urinalysis, Routine w reflex microscopic  Result Value Ref Range   Specific Gravity, UA 1.025 1.005 - 1.030   pH, UA 5.5 5.0 - 7.5   Color, UA Yellow Yellow   Appearance Ur Clear Clear   Leukocytes,UA Negative  Negative   Protein,UA 1+ (A) Negative/Trace   Glucose, UA Negative Negative   Ketones, UA Negative Negative   RBC, UA Negative Negative   Bilirubin, UA Negative Negative   Urobilinogen, Ur 1.0 0.2 - 1.0 mg/dL   Nitrite, UA Negative Negative   Microscopic  Examination See below:   TSH  Result Value Ref Range   TSH 1.380 0.450 - 4.500 uIU/mL  Bayer DCA Hb A1c Waived  Result Value Ref Range   HB A1C (BAYER DCA - WAIVED) 6.8 (H) 4.8 - 5.6 %  Microalbumin, Urine Waived  Result Value Ref Range   Microalb, Ur Waived 80 (H) 0 - 19 mg/L   Creatinine, Urine Waived 100 10 - 300 mg/dL   Microalb/Creat Ratio 30-300 (H) <30 mg/g  B12  Result Value Ref Range   Vitamin B-12 351 232 - 1,245 pg/mL      Assessment & Plan:   Problem List Items Addressed This Visit       Genitourinary   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 Continue current regimen. Call with any concerns. Refills up to date. Call with any concerns.       Relevant Orders   Basic metabolic panel     Follow up plan: Return in about 4 months (around 11/24/2022).

## 2022-07-26 ENCOUNTER — Other Ambulatory Visit: Payer: Self-pay | Admitting: Family Medicine

## 2022-07-26 LAB — BASIC METABOLIC PANEL
BUN/Creatinine Ratio: 18 (ref 12–28)
BUN: 25 mg/dL (ref 8–27)
CO2: 20 mmol/L (ref 20–29)
Calcium: 9.5 mg/dL (ref 8.7–10.3)
Chloride: 109 mmol/L — ABNORMAL HIGH (ref 96–106)
Creatinine, Ser: 1.36 mg/dL — ABNORMAL HIGH (ref 0.57–1.00)
Glucose: 126 mg/dL — ABNORMAL HIGH (ref 70–99)
Potassium: 4.7 mmol/L (ref 3.5–5.2)
Sodium: 143 mmol/L (ref 134–144)
eGFR: 40 mL/min/{1.73_m2} — ABNORMAL LOW (ref >59)

## 2022-07-26 MED ORDER — ONETOUCH VERIO VI STRP: ORAL_STRIP | 12 refills | Status: DC

## 2022-07-26 NOTE — Telephone Encounter (Signed)
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

## 2022-07-26 NOTE — Telephone Encounter (Signed)
Medication Refill - Medication: glucose blood (ONETOUCH VERIO) test strip   Has the patient contacted their pharmacy? Yes.    Preferred Pharmacy (with phone number or street name):  [facility] [place], [address] Phone: [phone]  Fax: [phone]     Has the patient been seen for an appointment in the last year OR does the patient have an upcoming appointment? Yes.    Agent: Please be advised that RX refills may take up to 3 business days. We ask that you follow-up with your pharmacy.

## 2022-08-10 DIAGNOSIS — S90424A Blister (nonthermal), right lesser toe(s), initial encounter: Secondary | ICD-10-CM | POA: Diagnosis not present

## 2022-08-10 DIAGNOSIS — L089 Local infection of the skin and subcutaneous tissue, unspecified: Secondary | ICD-10-CM | POA: Diagnosis not present

## 2022-08-22 ENCOUNTER — Ambulatory Visit
Admission: RE | Admit: 2022-08-22 | Discharge: 2022-08-22 | Disposition: A | Discharge status: Home / Self Care | Payer: No Typology Code available for payment source | Source: Ambulatory Visit | Attending: Family Medicine | Admitting: Family Medicine

## 2022-08-22 DIAGNOSIS — M85852 Other specified disorders of bone density and structure, left thigh: Secondary | ICD-10-CM | POA: Insufficient documentation

## 2022-08-22 DIAGNOSIS — Z13820 Encounter for screening for osteoporosis: Secondary | ICD-10-CM

## 2022-08-22 DIAGNOSIS — E039 Hypothyroidism, unspecified: Secondary | ICD-10-CM | POA: Insufficient documentation

## 2022-08-22 DIAGNOSIS — Z780 Asymptomatic menopausal state: Secondary | ICD-10-CM | POA: Diagnosis not present

## 2022-08-22 DIAGNOSIS — E119 Type 2 diabetes mellitus without complications: Secondary | ICD-10-CM | POA: Insufficient documentation

## 2022-08-22 DIAGNOSIS — Z1231 Encounter for screening mammogram for malignant neoplasm of breast: Secondary | ICD-10-CM | POA: Insufficient documentation

## 2022-08-26 DIAGNOSIS — E119 Type 2 diabetes mellitus without complications: Secondary | ICD-10-CM | POA: Diagnosis not present

## 2022-08-26 DIAGNOSIS — H2513 Age-related nuclear cataract, bilateral: Secondary | ICD-10-CM | POA: Diagnosis not present

## 2022-08-26 LAB — HM DIABETES EYE EXAM

## 2022-09-06 ENCOUNTER — Inpatient Hospital Stay: Payer: No Typology Code available for payment source | Attending: Oncology

## 2022-09-06 DIAGNOSIS — D649 Anemia, unspecified: Secondary | ICD-10-CM

## 2022-09-06 DIAGNOSIS — D638 Anemia in other chronic diseases classified elsewhere: Secondary | ICD-10-CM | POA: Diagnosis not present

## 2022-09-06 DIAGNOSIS — D508 Other iron deficiency anemias: Secondary | ICD-10-CM | POA: Diagnosis not present

## 2022-09-06 LAB — CBC WITH DIFFERENTIAL/PLATELET
Abs Immature Granulocytes: 0.09 10*3/uL — ABNORMAL HIGH (ref 0.00–0.07)
Basophils Absolute: 0 10*3/uL (ref 0.0–0.1)
Basophils Relative: 0 %
Eosinophils Absolute: 0.1 10*3/uL (ref 0.0–0.5)
Eosinophils Relative: 1 %
HCT: 30.9 % — ABNORMAL LOW (ref 36.0–46.0)
Hemoglobin: 9.9 g/dL — ABNORMAL LOW (ref 12.0–15.0)
Immature Granulocytes: 1 %
Lymphocytes Relative: 30 %
Lymphs Abs: 1.9 10*3/uL (ref 0.7–4.0)
MCH: 30.8 pg (ref 26.0–34.0)
MCHC: 32 g/dL (ref 30.0–36.0)
MCV: 96.3 fL (ref 80.0–100.0)
Monocytes Absolute: 0.8 10*3/uL (ref 0.1–1.0)
Monocytes Relative: 12 %
Neutro Abs: 3.5 10*3/uL (ref 1.7–7.7)
Neutrophils Relative %: 56 %
Platelets: 215 10*3/uL (ref 150–400)
RBC: 3.21 MIL/uL — ABNORMAL LOW (ref 3.87–5.11)
RDW: 12.1 % (ref 11.5–15.5)
WBC: 6.3 10*3/uL (ref 4.0–10.5)
nRBC: 0 % (ref 0.0–0.2)

## 2022-09-06 LAB — IRON AND TIBC
Iron: 41 ug/dL (ref 28–170)
Saturation Ratios: 12 % (ref 10.4–31.8)
TIBC: 330 ug/dL (ref 250–450)
UIBC: 289 ug/dL

## 2022-09-06 LAB — FERRITIN: Ferritin: 22 ng/mL (ref 11–307)

## 2022-09-09 ENCOUNTER — Encounter: Payer: Self-pay | Admitting: Oncology

## 2022-09-09 ENCOUNTER — Inpatient Hospital Stay (HOSPITAL_BASED_OUTPATIENT_CLINIC_OR_DEPARTMENT_OTHER): Payer: No Typology Code available for payment source | Admitting: Oncology

## 2022-09-09 ENCOUNTER — Other Ambulatory Visit: Payer: No Typology Code available for payment source

## 2022-09-09 VITALS — BP 143/57 | HR 54 | Resp 18 | Ht 61.0 in | Wt 161.0 lb

## 2022-09-09 DIAGNOSIS — D509 Iron deficiency anemia, unspecified: Secondary | ICD-10-CM | POA: Insufficient documentation

## 2022-09-09 DIAGNOSIS — D508 Other iron deficiency anemias: Secondary | ICD-10-CM | POA: Diagnosis not present

## 2022-09-09 DIAGNOSIS — D649 Anemia, unspecified: Secondary | ICD-10-CM | POA: Diagnosis not present

## 2022-09-09 NOTE — Progress Notes (Signed)
Hematology/Oncology Consult note [facility]  [phone] [phone]  Patient Care Team: [name], DO as PCP - General (Family Medicine) Kathrine Haddock, NP as PCP - Family Medicine (Nurse Practitioner) Kate Sable, MD as PCP - Cardiology (Cardiology) Sharlet Salina, MD as Referring Physician (Physical Medicine and Rehabilitation) Sindy Guadeloupe, MD as Consulting Physician (Hematology and Oncology)   Name of the patient: Teresa [name]  [id number]  07-22-1946   Date of visit: 09/09/22  Diagnosis- normocytic anemia likely secondary to chronic disease with a component of iron deficiency  Chief complaint/ Reason for visit-routine follow-up of anemia  Heme/Onc history: Patient is a 77 year old female with a past medical history significant for hypertension hyperlipidemia hypothyroidism who was seen by Dr. Bonna Gains for symptoms of constipation.  As a part of her work-up she had her CBC checked which showed a low hemoglobin of 10.7 and hence she has been referred to [place].  Looking back at her prior CBCs patient has had a normal white count and a platelet count but hemoglobin has been between 10-11 since 2017 and has remained stable around that range.  There has been no improvement nor decline in her hemoglobin.  Ferritin levels were normal at 77 on 03/12/2021 with normal iron studies.  TSH in April 2022 was normal.    Interval history-reports mild chronic fatigue but denies any new complaints at this time  ECOG PS- 1 Pain scale- 0   Review of systems- Review of Systems  Constitutional:  Negative for chills, fever, malaise/fatigue and weight loss.  HENT:  Negative for congestion, ear discharge and nosebleeds.   Eyes:  Negative for blurred vision.  Respiratory:  Negative for cough, hemoptysis, sputum production, shortness of breath and wheezing.   Cardiovascular:  Negative for chest pain, palpitations, orthopnea and  claudication.  Gastrointestinal:  Negative for abdominal pain, blood in stool, constipation, diarrhea, heartburn, melena, nausea and vomiting.  Genitourinary:  Negative for dysuria, flank pain, frequency, hematuria and urgency.  Musculoskeletal:  Negative for back pain, joint pain and myalgias.  Skin:  Negative for rash.  Neurological:  Negative for dizziness, tingling, focal weakness, seizures, weakness and headaches.  Endo/Heme/Allergies:  Does not bruise/bleed easily.  Psychiatric/Behavioral:  Negative for depression and suicidal ideas. The patient does not have insomnia.       Allergies  Allergen Reactions   Amlodipine Other (See Comments)    Ankle swelling   Atorvastatin     cramps   Evista [Raloxifene] Nausea Only     Past Medical History:  Diagnosis Date   Diabetes mellitus without complication (HCC)    Heart murmur    Hyperlipidemia    Hypertension    Hypothyroidism    Lichen sclerosus    Obesity    Osteopenia    Stroke [facility])    Thyroid disease      Past Surgical History:  Procedure Laterality Date   carpel tunnel     COLONOSCOPY WITH PROPOFOL N/A 08/30/2020   Procedure: COLONOSCOPY WITH PROPOFOL;  Surgeon: Virgel Manifold, MD;  Location: [facility] ENDOSCOPY;  Service: Gastroenterology;  Laterality: N/A;   ROTATOR CUFF REPAIR Left    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      Social History   Socioeconomic History   Marital status: Married    Spouse name: Not on file   Number of children: Not on file   Years of education: Not on file   Highest education level: High school graduate  Occupational History  Occupation: retired  Tobacco Use   Smoking status: Former    Packs/day: 2.00    Types: Cigarettes    Quit date: 01/1965    Years since quitting: 57.7   Smokeless tobacco: Never  Vaping Use   Vaping Use: Never used  Substance and Sexual Activity   Alcohol use: No    Alcohol/week: 0.0 standard drinks of alcohol   Drug use: No   Sexual activity: Never   Other Topics Concern   Not on file  Social History Narrative   Not on file   Social Determinants of Health   Financial Resource Strain: Low Risk  (03/20/2022)   Overall Financial Resource Strain (CARDIA)    Difficulty of Paying Living Expenses: Not hard at all  Food Insecurity: No Food Insecurity (03/20/2022)   Hunger Vital Sign    Worried About Running Out of Food in the Last Year: Never true    Ran Out of Food in the Last Year: Never true  Transportation Needs: No Transportation Needs (03/20/2022)   PRAPARE - Hydrologist (Medical): No    Lack of Transportation (Non-Medical): No  Physical Activity: Inactive (03/20/2022)   Exercise Vital Sign    Days of Exercise per Week: 0 days    Minutes of Exercise per Session: 0 min  Stress: No Stress Concern Present (03/20/2022)   [place]    Feeling of Stress : Only a little  Social Connections: Socially Integrated (03/20/2022)   Social Connection and Isolation Panel [NHANES]    Frequency of Communication with Friends and Family: More than three times a week    Frequency of Social Gatherings with Friends and Family: Twice a week    Attends Religious Services: More than 4 times per year    Active Member of [employer] or Organizations: Yes    Attends Music therapist: More than 4 times per year    Marital Status: Married  Human resources officer Violence: Not At Risk (03/20/2022)   Humiliation, Afraid, Rape, and Kick questionnaire    Fear of Current or Ex-Partner: No    Emotionally Abused: No    Physically Abused: No    Sexually Abused: No    Family History  Problem Relation Age of Onset   Hypertension Mother    Thyroid disease Mother    Glaucoma Mother    Coronary artery disease Mother    Stroke Father    Diabetes Brother    Hypertension Maternal Grandfather    Breast cancer Neg Hx      Current Outpatient Medications:    ASPIRIN 81 PO,  Take by mouth daily., Disp: , Rfl:    blood glucose meter kit and supplies, Dispense based on patient and insurance preference. Use up to four times daily as directed. (FOR ICD-9 250.00, 250.01)., Disp: 1 each, Rfl: 0   Blood Glucose Monitoring Suppl (ONETOUCH VERIO) w/Device KIT, 1 each by Does not apply route daily., Disp: 1 kit, Rfl: 0   chlorthalidone (HYGROTON) 25 MG tablet, Take 1 tablet (25 mg total) by mouth daily., Disp: 90 tablet, Rfl: 1   clobetasol ointment (TEMOVATE) 0.05 %, APPLY  OINTMENT TOPICALLY TWICE DAILY, Disp: 30 g, Rfl: 0   clopidogrel (PLAVIX) 75 MG tablet, Take 1 tablet (75 mg total) by mouth daily., Disp: 90 tablet, Rfl: 3   folic acid (FOLVITE) 496 MCG tablet, Take 400 mcg by mouth daily., Disp: , Rfl:    glucose blood (  ONETOUCH VERIO) test strip, Use as instructed, Disp: 100 each, Rfl: 12   glucose blood test strip, 1 each by Other route 2 (two) times daily. Use as instructed, Dx: E11.22, Disp: 100 each, Rfl: 12   levothyroxine (SYNTHROID) 50 MCG tablet, Take 1 tablet (50 mcg total) by mouth daily., Disp: 90 tablet, Rfl: 3   lisinopril (ZESTRIL) 30 MG tablet, Take 1 tablet (30 mg total) by mouth daily., Disp: 90 tablet, Rfl: 1   metFORMIN (GLUCOPHAGE-XR) 500 MG 24 hr tablet, TAKE 2 TABLETS TWO TIMES A DAY, Disp: 360 tablet, Rfl: 1   nortriptyline (PAMELOR) 10 MG capsule, Take 1-2 capsules (10-20 mg total) by mouth at bedtime., Disp: 180 capsule, Rfl: 1   ondansetron (ZOFRAN) 4 MG tablet, Take 1 tablet (4 mg total) by mouth every 8 (eight) hours as needed for nausea or vomiting., Disp: 20 tablet, Rfl: 0   OneTouch Delica Lancets 32K MISC, 1 each by Does not apply route daily., Disp: 100 each, Rfl: 3   rosuvastatin (CRESTOR) 10 MG tablet, Take 1 tablet (10 mg total) by mouth daily., Disp: 90 tablet, Rfl: 1   valACYclovir (VALTREX) 1000 MG tablet, Take 1,000 mg by mouth 3 (three) times daily., Disp: , Rfl:    vitamin B-12 (CYANOCOBALAMIN) 1000 MCG tablet, Take 1,000 mcg by  mouth daily., Disp: , Rfl:   Physical exam:  Vitals:   09/09/22 1132 09/09/22 1133  BP:  (!) 143/57  Pulse:  (!) 54  Resp:  18  SpO2:  100%  Weight: 161 lb (73 kg)   Height: '5\' 1"'$  (1.549 m)    Physical Exam Cardiovascular:     Rate and Rhythm: Normal rate and regular rhythm.     Heart sounds: Normal heart sounds.  Pulmonary:     Effort: Pulmonary effort is normal.     Breath sounds: Normal breath sounds.  Skin:    General: Skin is warm and dry.  Neurological:     Mental Status: She is alert and oriented to person, place, and time.         Latest Ref Rng & Units 07/25/2022    9:42 AM  CMP  Glucose 70 - 99 mg/dL 126   BUN 8 - 27 mg/dL 25   Creatinine 0.57 - 1.00 mg/dL 1.36   Sodium 134 - 144 mmol/L 143   Potassium 3.5 - 5.2 mmol/L 4.7   Chloride 96 - 106 mmol/L 109   CO2 20 - 29 mmol/L 20   Calcium 8.7 - 10.3 mg/dL 9.5       Latest Ref Rng & Units 09/06/2022   10:51 AM  CBC  WBC 4.0 - 10.5 K/uL 6.3   Hemoglobin 12.0 - 15.0 g/dL 9.9   Hematocrit 36.0 - 46.0 % 30.9   Platelets 150 - 400 K/uL 215     No images are attached to the encounter.  MM 3D SCREEN BREAST BILATERAL  Result Date: 08/23/2022 CLINICAL DATA:  Screening. EXAM: DIGITAL SCREENING BILATERAL MAMMOGRAM WITH TOMOSYNTHESIS AND CAD TECHNIQUE: Bilateral screening digital craniocaudal and mediolateral oblique mammograms were obtained. Bilateral screening digital breast tomosynthesis was performed. The images were evaluated with computer-aided detection. COMPARISON:  Previous exam(s). ACR Breast Density Category c: The breast tissue is heterogeneously dense, which may obscure small masses. FINDINGS: There are no findings suspicious for malignancy. IMPRESSION: No mammographic evidence of malignancy. A result letter of this screening mammogram will be mailed directly to the patient. RECOMMENDATION: Screening mammogram in one year. (Code:SM-B-01Y) BI-RADS CATEGORY  1: Negative. Electronically Signed   By: Lajean Manes M.D.   On: 08/23/2022 09:44   DG Bone Density  Result Date: 08/22/2022 EXAM: DUAL X-RAY ABSORPTIOMETRY (DXA) FOR BONE MINERAL DENSITY IMPRESSION: Your patient Amilee Janvier completed a BMD test on 08/22/2022 using the [place] (software version: 14.10) manufactured by [employer]. The following summarizes the results of our evaluation. Technologist:VLM PATIENT BIOGRAPHICAL: Name: Ferris, Tally Patient ID: [id number] Birth Date: [birth year] Height: 61.0 in. Gender: Female Exam Date: 08/22/2022 Weight: 160.9 lbs. Indications: Caucasian, Diabetic, Hypothyroid, Postmenopausal Fractures: Treatments: Synthroid DENSITOMETRY RESULTS: Site      Region     Measured Date Measured Age WHO Classification Young Adult T-score BMD         %Change vs. Previous Significant Change (*) AP Spine L1-L2 08/22/2022 76.1 Normal -0.6 1.105 g/cm2 -6.1% Yes AP Spine L1-L2 01/22/2016 69.5 Normal 0.0 1.177 g/cm2 - - DualFemur Neck Left 08/22/2022 76.1 Osteopenia -1.9 0.772 g/cm2 -9.1% Yes DualFemur Neck Left 01/22/2016 69.5 Osteopenia -1.4 0.849 g/cm2 - - DualFemur Total Mean 08/22/2022 76.1 Normal -1.0 0.885 g/cm2 -7.6% Yes DualFemur Total Mean 01/22/2016 69.5 Normal -0.4 0.958 g/cm2 - - ASSESSMENT: The BMD measured at Femur Neck Left is 0.772 g/cm2 with a T-score of -1.9. This patient is considered osteopenic according to [facility]) criteria. Compared with prior study, there has been significant decrease in the spine. Compared with prior study, there has been significant decrease in the total hip. L-3 & L-4 were excluded due to degenerative changes. The scan quality is good. World Pharmacologist [facility]) criteria for post-menopausal, Caucasian Women: Normal:                   T-score at or above -1 SD Osteopenia/low bone mass: T-score between -1 and -2.5 SD Osteoporosis:             T-score at or below -2.5 SD RECOMMENDATIONS: 1. All patients should optimize calcium and vitamin D  intake. 2. Consider FDA-approved medical therapies in postmenopausal women and men aged 28 years and older, based on the following: a. A hip or vertebral(clinical or morphometric) fracture b. T-score < -2.5 at the femoral neck or spine after appropriate evaluation to exclude secondary causes c. Low bone mass (T-score between -1.0 and -2.5 at the femoral neck or spine) and a 10-year probability of a hip fracture > 3% or a 10-year probability of a major osteoporosis-related fracture > 20% based on the [place]-adapted WHO algorithm 3. Clinician judgment and/or patient preferences may indicate treatment for people with 10-year fracture probabilities above or below these levels FOLLOW-UP: People with diagnosed cases of osteoporosis or at high risk for fracture should have regular bone mineral density tests. For patients eligible for Medicare, routine testing is allowed once every 2 years. The testing frequency can be increased to one year for patients who have rapidly progressing disease, those who are receiving or discontinuing medical therapy to restore bone mass, or have additional risk factors. I have reviewed this report, and agree with the above findings. [facility] Dear Jinny Blossom Annia Friendly, Your patient BRETTA FEES completed a FRAX assessment on 08/22/2022 using the [place] (analysis version: 14.10) manufactured by [employer]. The following summarizes the results of our evaluation. PATIENT BIOGRAPHICAL: Name: Jatara, Huettner Patient ID: [id number] Birth Date: 01-12-1946 Height:    61.0 in. Gender:     Female    [age]       Weight:    160.9 lbs. Ethnicity:  White                            Exam Date: 08/22/2022 FRAX* RESULTS:  (version: 3.5) 10-year Probability of Fracture1 Major Osteoporotic Fracture2 Hip Fracture 13.0% 3.3% Population: [place] (Caucasian) Risk Factors: None Based on Femur (Left) Neck BMD 1 -The 10-year probability of fracture may be lower than reported if  the patient has received treatment. 2 -Major Osteoporotic Fracture: Clinical Spine, Forearm, Hip or Shoulder *FRAX is a Materials engineer of the [employer] of [employer] for Metabolic Bone Disease, a [place] (WHO) [facility]. ASSESSMENT: The probability of a major osteoporotic fracture is 13.0% within the next ten years. The probability of a hip fracture is 3.3% within the next ten years. . Electronically Signed   By: Zerita Boers M.D.   On: 08/22/2022 11:42     Assessment and plan- Patient is a 77 y.o. female who is here for routine follow-up of anemia of chronic disease with a component of iron deficiency anemia  At baseline patient is hemoglobin runs between 10.5-11.5.  It is gradually drifted down and is presently at 9.9.  Ferritin levels are low at 22 with an iron saturation of 12%.  It would be reasonable to offer her IV iron to see if her iron studies as well as hemoglobin can improve.  Discussed risks and benefits of IV iron including all but not limited to possible risk of infusion reactions.  Patient understands and agrees to proceed as planned.  We will schedule her for Venofer x 5 infusions.  Will repeat CBC ferritin and iron studies in 3 months in 6 months and see her back in 6 months.  Will also check B12 levels in 3 months   Visit Diagnosis 1. Normocytic anemia   2. Other iron deficiency anemia      Dr. Randa Evens, MD, MPH [facility] at [facility] [phone] 09/09/2022 2:04 PM

## 2022-09-19 ENCOUNTER — Inpatient Hospital Stay: Payer: No Typology Code available for payment source | Attending: Oncology

## 2022-09-19 VITALS — BP 119/46 | HR 51 | Temp 96.1°F | Resp 18

## 2022-09-19 DIAGNOSIS — D508 Other iron deficiency anemias: Secondary | ICD-10-CM

## 2022-09-19 DIAGNOSIS — D509 Iron deficiency anemia, unspecified: Secondary | ICD-10-CM | POA: Diagnosis not present

## 2022-09-19 MED ORDER — SODIUM CHLORIDE 0.9 % IV SOLN
Freq: Once | INTRAVENOUS | Status: AC
  Filled 2022-09-19: qty 250

## 2022-09-19 MED ORDER — SODIUM CHLORIDE 0.9 % IV SOLN
200.0000 mg | INTRAVENOUS | Status: DC
  Administered 2022-09-19: 200 mg via INTRAVENOUS
  Filled 2022-09-19: qty 200

## 2022-09-19 NOTE — Patient Instructions (Signed)

## 2022-09-23 DIAGNOSIS — L600 Ingrowing nail: Secondary | ICD-10-CM | POA: Diagnosis not present

## 2022-09-23 DIAGNOSIS — L03031 Cellulitis of right toe: Secondary | ICD-10-CM | POA: Diagnosis not present

## 2022-09-23 DIAGNOSIS — M898X9 Other specified disorders of bone, unspecified site: Secondary | ICD-10-CM | POA: Diagnosis not present

## 2022-09-23 DIAGNOSIS — M2011 Hallux valgus (acquired), right foot: Secondary | ICD-10-CM | POA: Diagnosis not present

## 2022-09-23 DIAGNOSIS — E119 Type 2 diabetes mellitus without complications: Secondary | ICD-10-CM | POA: Diagnosis not present

## 2022-09-23 DIAGNOSIS — M2012 Hallux valgus (acquired), left foot: Secondary | ICD-10-CM | POA: Diagnosis not present

## 2022-09-24 ENCOUNTER — Inpatient Hospital Stay: Payer: No Typology Code available for payment source

## 2022-09-24 VITALS — BP 137/44 | HR 64 | Temp 97.7°F | Resp 20

## 2022-09-24 DIAGNOSIS — D508 Other iron deficiency anemias: Secondary | ICD-10-CM

## 2022-09-24 DIAGNOSIS — D509 Iron deficiency anemia, unspecified: Secondary | ICD-10-CM | POA: Diagnosis not present

## 2022-09-24 MED ORDER — SODIUM CHLORIDE 0.9 % IV SOLN
200.0000 mg | INTRAVENOUS | Status: DC
  Administered 2022-09-24: 200 mg via INTRAVENOUS
  Filled 2022-09-24: qty 200

## 2022-09-24 MED ORDER — SODIUM CHLORIDE 0.9 % IV SOLN
Freq: Once | INTRAVENOUS | Status: AC
  Filled 2022-09-24: qty 250

## 2022-09-24 NOTE — Patient Instructions (Signed)

## 2022-10-01 ENCOUNTER — Inpatient Hospital Stay: Payer: No Typology Code available for payment source

## 2022-10-01 VITALS — BP 143/58 | HR 65 | Temp 97.6°F | Resp 16

## 2022-10-01 DIAGNOSIS — D509 Iron deficiency anemia, unspecified: Secondary | ICD-10-CM | POA: Diagnosis not present

## 2022-10-01 DIAGNOSIS — D508 Other iron deficiency anemias: Secondary | ICD-10-CM

## 2022-10-01 MED ORDER — SODIUM CHLORIDE 0.9 % IV SOLN
Freq: Once | INTRAVENOUS | Status: AC
  Filled 2022-10-01: qty 250

## 2022-10-01 MED ORDER — SODIUM CHLORIDE 0.9 % IV SOLN
200.0000 mg | INTRAVENOUS | Status: DC
  Administered 2022-10-01: 200 mg via INTRAVENOUS
  Filled 2022-10-01: qty 200

## 2022-10-04 ENCOUNTER — Inpatient Hospital Stay: Payer: No Typology Code available for payment source

## 2022-10-04 VITALS — BP 142/65 | HR 60 | Temp 97.0°F | Resp 18

## 2022-10-04 DIAGNOSIS — D508 Other iron deficiency anemias: Secondary | ICD-10-CM

## 2022-10-04 DIAGNOSIS — D509 Iron deficiency anemia, unspecified: Secondary | ICD-10-CM | POA: Diagnosis not present

## 2022-10-04 MED ORDER — SODIUM CHLORIDE 0.9 % IV SOLN
Freq: Once | INTRAVENOUS | Status: AC
  Filled 2022-10-04: qty 250

## 2022-10-04 MED ORDER — SODIUM CHLORIDE 0.9 % IV SOLN
200.0000 mg | INTRAVENOUS | Status: DC
  Administered 2022-10-04: 200 mg via INTRAVENOUS
  Filled 2022-10-04: qty 200

## 2022-10-09 MED FILL — Iron Sucrose Inj 20 MG/ML (Fe Equiv): INTRAVENOUS | Qty: 10 | Status: AC

## 2022-10-10 ENCOUNTER — Inpatient Hospital Stay: Payer: No Typology Code available for payment source

## 2022-10-10 ENCOUNTER — Telehealth: Payer: Self-pay | Admitting: *Deleted

## 2022-10-10 NOTE — Telephone Encounter (Signed)
I called today to the patient because she had venofer infusion today and she did not come. She said she thought the appt. Was for tomorrow. I said that we can reschedule her and I will send a message to scheduler to get the last one done. She is ok with this and wants to be called with the appt

## 2022-10-21 ENCOUNTER — Inpatient Hospital Stay: Payer: No Typology Code available for payment source | Attending: Oncology

## 2022-10-21 VITALS — BP 158/57 | HR 64 | Temp 96.1°F | Resp 18

## 2022-10-21 DIAGNOSIS — D508 Other iron deficiency anemias: Secondary | ICD-10-CM

## 2022-10-21 DIAGNOSIS — D509 Iron deficiency anemia, unspecified: Secondary | ICD-10-CM | POA: Diagnosis not present

## 2022-10-21 MED ORDER — SODIUM CHLORIDE 0.9 % IV SOLN
200.0000 mg | INTRAVENOUS | Status: DC
  Administered 2022-10-21: 200 mg via INTRAVENOUS
  Filled 2022-10-21: qty 200

## 2022-10-21 MED ORDER — SODIUM CHLORIDE 0.9 % IV SOLN
Freq: Once | INTRAVENOUS | Status: AC
  Filled 2022-10-21: qty 250

## 2022-10-25 ENCOUNTER — Other Ambulatory Visit: Payer: Self-pay

## 2022-10-25 MED ORDER — ONETOUCH DELICA LANCETS 33G MISC: 1.0000 | Freq: Every day | 3 refills | Status: DC

## 2022-10-25 MED ORDER — ONETOUCH VERIO VI STRP: ORAL_STRIP | 12 refills | Status: DC

## 2022-10-28 ENCOUNTER — Other Ambulatory Visit: Payer: Self-pay

## 2022-10-28 DIAGNOSIS — E1122 Type 2 diabetes mellitus with diabetic chronic kidney disease: Secondary | ICD-10-CM

## 2022-10-28 MED ORDER — ONETOUCH DELICA LANCETS 33G MISC: 1.0000 | Freq: Every day | 3 refills | Status: DC

## 2022-11-06 ENCOUNTER — Other Ambulatory Visit: Payer: Self-pay

## 2022-11-06 DIAGNOSIS — I129 Hypertensive chronic kidney disease with stage 1 through stage 4 chronic kidney disease, or unspecified chronic kidney disease: Secondary | ICD-10-CM

## 2022-11-06 NOTE — Telephone Encounter (Signed)
Pt called back and states that she does have enough medication.

## 2022-11-06 NOTE — Telephone Encounter (Signed)
Called patient to clarify if patient has enough medication to last until her next scheduled appointment with Dr Wynetta Emery. Patient says she would give our office a call back to let our office know.  OK for [facility] to gather information if patients call back.

## 2022-11-06 NOTE — Telephone Encounter (Signed)
Does she have enough to get to her appointment? 

## 2022-11-18 NOTE — Telephone Encounter (Signed)
[facility] Browns[facility], checking on status of meds for pt. Please call back.

## 2022-11-21 ENCOUNTER — Other Ambulatory Visit: Payer: Self-pay | Admitting: Family Medicine

## 2022-11-21 DIAGNOSIS — I129 Hypertensive chronic kidney disease with stage 1 through stage 4 chronic kidney disease, or unspecified chronic kidney disease: Secondary | ICD-10-CM

## 2022-11-21 MED ORDER — METFORMIN HCL ER 500 MG PO TB24: ORAL_TABLET | ORAL | 0 refills | Status: DC

## 2022-11-21 NOTE — Telephone Encounter (Signed)
Requested Prescriptions  Pending Prescriptions Disp Refills   metFORMIN (GLUCOPHAGE-XR) 500 MG 24 hr tablet 360 tablet 0    Sig: TAKE 2 TABLETS TWO TIMES A DAY     Endocrinology:  Diabetes - Biguanides Failed - 11/21/2022  9:38 AM      Failed - Cr in normal range and within 360 days    Creatinine, Ser  Date Value Ref Range Status  07/25/2022 1.36 (H) 0.57 - 1.00 mg/dL Final         Failed - eGFR in normal range and within 360 days    GFR calc Af Amer  Date Value Ref Range Status  05/23/2020 79 >59 mL/min/1.73 Final    Comment:    **[facility] currently reports eGFR in compliance with the current**   recommendations of the [employer]. [facility] will   update reporting as new guidelines are published from the NKF-ASN   Task force.    GFR, Estimated  Date Value Ref Range Status  04/17/2021 53 (L) >60 mL/min Final    Comment:    (NOTE) Calculated using the CKD-EPI Creatinine Equation (2021)    eGFR  Date Value Ref Range Status  07/25/2022 40 (L) >59 mL/min/1.73 Final         Passed - HBA1C is between 0 and 7.9 and within 180 days    HB A1C (BAYER DCA - WAIVED)  Date Value Ref Range Status  05/30/2022 6.8 (H) 4.8 - 5.6 % Final    Comment:             Prediabetes: 5.7 - 6.4          Diabetes: >6.4          Glycemic control for adults with diabetes: <7.0          Passed - B12 Level in normal range and within 720 days    Vitamin B-12  Date Value Ref Range Status  05/30/2022 351 232 - 1,245 pg/mL Final         Passed - Valid encounter within last 6 months    Recent Outpatient Visits           3 months ago 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)   [place] [facility] [place], Megan P, DO   4 months ago 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)   [place] [facility] [place], Megan P, DO   5 months ago Routine general medical examination at a health care facility    [facility] [place], [place] P, DO   6 months ago Postherpetic neuralgia   [place] [facility] [place], Megan P, DO   7 months ago Postherpetic neuralgia   [place] [facility] [place], [place], DO       Future Appointments             In 5 days Laural Benes, Oralia Rud, DO  [facility], [facility]            Passed - CBC within normal limits and completed in the last 12 months    WBC  Date Value Ref Range Status  09/06/2022 6.3 4.0 - 10.5 K/uL Final   RBC  Date Value Ref Range Status  09/06/2022 3.21 (L) 3.87 - 5.11 MIL/uL Final   Hemoglobin  Date Value Ref Range Status  09/06/2022 9.9 (L) 12.0 - 15.0 g/dL Final  22/09/5425 06.2 (L) 11.1 -  15.9 g/dL Final   HCT  Date Value Ref Range Status  09/06/2022 30.9 (L) 36.0 - 46.0 % Final   Hematocrit  Date Value Ref Range Status  05/30/2022 30.6 (L) 34.0 - 46.6 % Final   MCHC  Date Value Ref Range Status  09/06/2022 32.0 30.0 - 36.0 g/dL Final   [facility]  Date Value Ref Range Status  09/06/2022 30.8 26.0 - 34.0 pg Final   MCV  Date Value Ref Range Status  09/06/2022 96.3 80.0 - 100.0 fL Final  05/30/2022 94 79 - 97 fL Final   No results found for: "PLTCOUNTKUC", "LABPLAT", "POCPLA" RDW  Date Value Ref Range Status  09/06/2022 12.1 11.5 - 15.5 % Final  05/30/2022 13.4 11.7 - 15.4 % Final

## 2022-11-21 NOTE — Telephone Encounter (Signed)
Medication Refill - Medication: lisinopril (ZESTRIL) 30 MG tablet   Has the patient contacted their pharmacy? Yes.   Cindy [facility] called to refill script. WYO#[id number]  Preferred Pharmacy (with phone number or street name):  [facility] - [place], [place] - [address] AT Portal to [facility] Phone: [phone]  Fax: [phone]     Has the patient been seen for an appointment in the last year OR does the patient have an upcoming appointment? No.  Agent: Please be advised that RX refills may take up to 3 business days. We ask that you follow-up with your pharmacy.

## 2022-11-21 NOTE — Telephone Encounter (Signed)
Medication Refill - Medication: metFORMIN (GLUCOPHAGE-XR) 500 MG 24 hr tablet   Has the patient contacted their pharmacy? Yes.   (Agent: If no, request that the patient contact the pharmacy for the refill. If patient does not wish to contact the pharmacy document the reason why and proceed with request.) (Agent: If yes, when and what did the pharmacy advise?)  Preferred Pharmacy (with phone number or street name):  [facility] - [place], [place] - [address] AT Portal to [facility] Sites Phone: [phone]  Fax: [phone]     Has the patient been seen for an appointment in the last year OR does the patient have an upcoming appointment? Yes.

## 2022-11-22 MED ORDER — LISINOPRIL 30 MG PO TABS: 30.0000 mg | ORAL_TABLET | Freq: Every day | ORAL | 0 refills | Status: DC

## 2022-11-22 NOTE — Telephone Encounter (Signed)
Requested Prescriptions  Pending Prescriptions Disp Refills   lisinopril (ZESTRIL) 30 MG tablet 90 tablet 0    Sig: Take 1 tablet (30 mg total) by mouth daily.     Cardiovascular:  ACE Inhibitors Failed - 11/21/2022  5:52 PM      Failed - Cr in normal range and within 180 days    Creatinine, Ser  Date Value Ref Range Status  07/25/2022 1.36 (H) 0.57 - 1.00 mg/dL Final         Failed - Last BP in normal range    BP Readings from Last 1 Encounters:  10/21/22 (!) 158/57         Passed - K in normal range and within 180 days    Potassium  Date Value Ref Range Status  07/25/2022 4.7 3.5 - 5.2 mmol/L Final         Passed - Patient is not pregnant      Passed - Valid encounter within last 6 months    Recent Outpatient Visits           4 months ago 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)   [place] [facility] [place], Megan P, DO   4 months ago 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

## 2022-11-26 ENCOUNTER — Ambulatory Visit (INDEPENDENT_AMBULATORY_CARE_PROVIDER_SITE_OTHER): Payer: No Typology Code available for payment source | Admitting: Family Medicine

## 2022-11-26 ENCOUNTER — Encounter: Payer: Self-pay | Admitting: Family Medicine

## 2022-11-26 ENCOUNTER — Ambulatory Visit: Payer: No Typology Code available for payment source | Attending: Family Medicine

## 2022-11-26 VITALS — BP 138/50 | HR 48 | Temp 98.0°F | Wt 159.1 lb

## 2022-11-26 DIAGNOSIS — N1831 Chronic kidney disease, stage 3a: Secondary | ICD-10-CM | POA: Diagnosis not present

## 2022-11-26 DIAGNOSIS — E039 Hypothyroidism, unspecified: Secondary | ICD-10-CM | POA: Diagnosis not present

## 2022-11-26 DIAGNOSIS — D692 Other nonthrombocytopenic purpura: Secondary | ICD-10-CM

## 2022-11-26 DIAGNOSIS — E538 Deficiency of other specified B group vitamins: Secondary | ICD-10-CM

## 2022-11-26 DIAGNOSIS — E782 Mixed hyperlipidemia: Secondary | ICD-10-CM | POA: Diagnosis not present

## 2022-11-26 DIAGNOSIS — E1122 Type 2 diabetes mellitus with diabetic chronic kidney disease: Secondary | ICD-10-CM | POA: Diagnosis not present

## 2022-11-26 DIAGNOSIS — D509 Iron deficiency anemia, unspecified: Secondary | ICD-10-CM

## 2022-11-26 DIAGNOSIS — R002 Palpitations: Secondary | ICD-10-CM | POA: Diagnosis not present

## 2022-11-26 DIAGNOSIS — I129 Hypertensive chronic kidney disease with stage 1 through stage 4 chronic kidney disease, or unspecified chronic kidney disease: Secondary | ICD-10-CM

## 2022-11-26 DIAGNOSIS — D649 Anemia, unspecified: Secondary | ICD-10-CM | POA: Diagnosis not present

## 2022-11-26 LAB — BAYER DCA HB A1C WAIVED: HB A1C (BAYER DCA - WAIVED): 6.7 % — ABNORMAL HIGH (ref 4.8–5.6)

## 2022-11-26 NOTE — Assessment & Plan Note (Signed)
Reassured patient. Continue to monitor.  

## 2022-11-26 NOTE — Assessment & Plan Note (Signed)
Rechecking labs today. Await results. Treat as needed.  °

## 2022-11-26 NOTE — Progress Notes (Signed)
Interpreted by me 11/26/22. Sinus bradycardia at 50bpm. No ST segment changes.

## 2022-11-26 NOTE — Assessment & Plan Note (Signed)
Stable with A1c of 6.7. Continue to monitor. Call with any concerns.

## 2022-11-26 NOTE — Progress Notes (Signed)
BP (!) 138/50   Pulse (!) 48   Temp 98 F (36.7 C) (Oral)   Wt 159 lb 1.6 oz (72.2 kg)   LMP  (LMP Unknown)   SpO2 99%   BMI 30.06 kg/m    Subjective:    Patient ID: Teresa [name], female    DOB: Jan 29, 1946, 77 y.o.   MRN: [id number]  HPI: Teresa [name] is a 77 y.o. female  Chief Complaint  Patient presents with   Diabetes   Hypertension   Hypothyroidism   Hyperlipidemia   PALPITATIONS Duration: about 3 weeks ago Symptom description: heart was racing Duration of episode: 15 minutes Frequency: once Activity when event occurred: rest Related to exertion: no Dyspnea: yes Chest pain: no Syncope: no Anxiety/stress: no Nausea/vomiting: no Diaphoresis: no Coronary artery disease: no Congestive heart failure: no Arrhythmia:no Thyroid disease: yes Caffeine intake: 1 cafienated beverage Status:  stable Treatments attempted:none  HYPERTENSION / HYPERLIPIDEMIA Satisfied with current treatment? yes Duration of hypertension: chronic BP monitoring frequency: not checking BP medication side effects: no BP meds: lisinopril, chlorthalidone Duration of hyperlipidemia: chronic Cholesterol medication side effects: no Cholesterol supplements: none Past cholesterol medications: crestor Medication compliance: excellent compliance Aspirin: yes Recent stressors: no Recurrent headaches: no Visual changes: no Palpitations: no Dyspnea: no Chest pain: no Lower extremity edema: no Dizzy/lightheaded: no  HYPOTHYROIDISM Thyroid control status:controlled Satisfied with current treatment? yes Medication side effects: no Medication compliance: excellent compliance Etiology of hypothyroidism:  Recent dose adjustment:no Fatigue: no Cold intolerance: no Heat intolerance: no Weight gain: no Weight loss: no Constipation: no Diarrhea/loose stools: no Palpitations: yes Lower extremity edema: no Anxiety/depressed mood: no  DIABETES Hypoglycemic  episodes:no Polydipsia/polyuria: no Visual disturbance: no Chest pain: no Paresthesias: no Glucose Monitoring: no  Accucheck frequency: Not Checking Taking Insulin?: no Blood Pressure Monitoring: not checking Retinal Examination: Up to Date Foot Exam: Up to Date Diabetic Education: Completed Pneumovax: Up to Date Influenza: Up to Date Aspirin: yes   Relevant past medical, surgical, family and social history reviewed and updated as indicated. Interim medical history since our last visit reviewed. Allergies and medications reviewed and updated.  Review of Systems  Constitutional: Negative.   HENT: Negative.    Respiratory: Negative.    Cardiovascular:  Positive for palpitations. Negative for chest pain and leg swelling.  Gastrointestinal: Negative.   Musculoskeletal: Negative.   Neurological: Negative.   Psychiatric/Behavioral: Negative.      Per HPI unless specifically indicated above     Objective:    BP (!) 138/50   Pulse (!) 48   Temp 98 F (36.7 C) (Oral)   Wt 159 lb 1.6 oz (72.2 kg)   LMP  (LMP Unknown)   SpO2 99%   BMI 30.06 kg/m   Wt Readings from Last 3 Encounters:  11/26/22 159 lb 1.6 oz (72.2 kg)  09/09/22 161 lb (73 kg)  07/25/22 160 lb 14.4 oz (73 kg)    Physical Exam Vitals and nursing note reviewed.  Constitutional:      General: She is not in acute distress.    Appearance: Normal appearance. She is obese. She is not ill-appearing, toxic-appearing or diaphoretic.  HENT:     Head: Normocephalic and atraumatic.     Right Ear: External ear normal.     Left Ear: External ear normal.     Nose: Nose normal.     Mouth/Throat:     Mouth: Mucous membranes are moist.     Pharynx: Oropharynx is clear.  Eyes:  General: No scleral icterus.       Right eye: No discharge.        Left eye: No discharge.     Extraocular Movements: Extraocular movements intact.     Conjunctiva/sclera: Conjunctivae normal.     Pupils: Pupils are equal, round, and  reactive to light.  Cardiovascular:     Rate and Rhythm: Normal rate and regular rhythm.     Pulses: Normal pulses.     Heart sounds: Normal heart sounds. No murmur heard.    No friction rub. No gallop.  Pulmonary:     Effort: Pulmonary effort is normal. No respiratory distress.     Breath sounds: Normal breath sounds. No stridor. No wheezing, rhonchi or rales.  Chest:     Chest wall: No tenderness.  Musculoskeletal:        General: Normal range of motion.     Cervical back: Normal range of motion and neck supple.  Skin:    General: Skin is warm and dry.     Capillary Refill: Capillary refill takes less than 2 seconds.     Coloration: Skin is not jaundiced or pale.     Findings: No bruising, erythema, lesion or rash.  Neurological:     General: No focal deficit present.     Mental Status: She is alert and oriented to person, place, and time. Mental status is at baseline.  Psychiatric:        Mood and Affect: Mood normal.        Behavior: Behavior normal.        Thought Content: Thought content normal.        Judgment: Judgment normal.     Results for orders placed or performed in visit on 11/26/22  Bayer DCA Hb A1c Waived  Result Value Ref Range   HB A1C (BAYER DCA - WAIVED) 6.7 (H) 4.8 - 5.6 %      Assessment & Plan:   Problem List Items Addressed This Visit       Cardiovascular and Mediastinum   Senile purpura    Reassured patient. Continue to monitor.         Endocrine   Hypothyroidism    Rechecking labs today. Await results. Treat as needed.       Relevant Orders   CBC with Differential/Platelet   Lipid Panel w/o Chol/HDL Ratio   Comprehensive metabolic panel   TSH   Type 2 diabetes mellitus with stage 3 chronic kidney disease, without long-term current use of insulin    Stable with A1c of 6.7. Continue to monitor. Call with any concerns.       Relevant Orders   Bayer DCA Hb A1c Waived (Completed)   CBC with Differential/Platelet   Lipid Panel w/o  Chol/HDL Ratio   Comprehensive metabolic panel     Genitourinary   Hypertensive CKD (chronic kidney disease)    Under good control on current regimen. Continue current regimen. Continue to monitor. Call with any concerns. Refills given. Labs drawn today.       Relevant Orders   CBC with Differential/Platelet   Lipid Panel w/o Chol/HDL Ratio   Comprehensive metabolic panel   CKD (chronic kidney disease), stage III    Rechecking labs today. Await results. Treat as needed.       Relevant Orders   CBC with Differential/Platelet   Lipid Panel w/o Chol/HDL Ratio   Comprehensive metabolic panel     Other   Hyperlipidemia    Under good control  on current regimen. Continue current regimen. Continue to monitor. Call with any concerns. Refills given. Labs drawn today.        Relevant Orders   CBC with Differential/Platelet   Lipid Panel w/o Chol/HDL Ratio   Comprehensive metabolic panel   Z61 deficiency    Rechecking labs today. Await results. Treat as needed.       Relevant Orders   CBC with Differential/Platelet   Lipid Panel w/o Chol/HDL Ratio   Comprehensive metabolic panel   W96   Anemia    Rechecking labs today. Await results. Treat as needed.       Relevant Orders   CBC with Differential/Platelet   Lipid Panel w/o Chol/HDL Ratio   Comprehensive metabolic panel   Iron Binding Cap (TIBC)([facility]/Sun[facility])   Ferritin   CBC with Differential/Platelet   Lipid Panel w/o Chol/HDL Ratio   Comprehensive metabolic panel   Iron deficiency anemia    Rechecking labs today. Await results. Treat as needed.       Relevant Orders   CBC with Differential/Platelet   Lipid Panel w/o Chol/HDL Ratio   Comprehensive metabolic panel   Iron Binding Cap (TIBC)([facility]/Sun[facility])   Ferritin   Other Visit Diagnoses     Palpitations    -  Primary   EKG shows bradycardia, but otherwise normal. Will get her set up with zio monitor. Await results.   Relevant Orders   EKG 12-Lead  (Completed)   LONG TERM MONITOR (3-14 DAYS)        Follow up plan: Return in about 6 weeks (around 01/07/2023).

## 2022-11-26 NOTE — Assessment & Plan Note (Signed)
Under good control on current regimen. Continue current regimen. Continue to monitor. Call with any concerns. Refills given. Labs drawn today.   

## 2022-11-27 LAB — COMPREHENSIVE METABOLIC PANEL
ALT: 16 IU/L (ref 0–32)
AST: 17 IU/L (ref 0–40)
Albumin/Globulin Ratio: 2 (ref 1.2–2.2)
Albumin: 4.5 g/dL (ref 3.8–4.8)
Alkaline Phosphatase: 89 IU/L (ref 44–121)
BUN/Creatinine Ratio: 22 (ref 12–28)
BUN: 27 mg/dL (ref 8–27)
Bilirubin Total: <0.2 mg/dL (ref 0.0–1.2)
CO2: 21 mmol/L (ref 20–29)
Calcium: 9.3 mg/dL (ref 8.7–10.3)
Chloride: 109 mmol/L — ABNORMAL HIGH (ref 96–106)
Creatinine, Ser: 1.22 mg/dL — ABNORMAL HIGH (ref 0.57–1.00)
Globulin, Total: 2.2 g/dL (ref 1.5–4.5)
Glucose: 72 mg/dL (ref 70–99)
Potassium: 4.8 mmol/L (ref 3.5–5.2)
Sodium: 145 mmol/L — ABNORMAL HIGH (ref 134–144)
Total Protein: 6.7 g/dL (ref 6.0–8.5)
eGFR: 46 mL/min/{1.73_m2} — ABNORMAL LOW (ref >59)

## 2022-11-27 LAB — LIPID PANEL W/O CHOL/HDL RATIO
Cholesterol, Total: 119 mg/dL (ref 100–199)
HDL: 50 mg/dL (ref >39)
LDL Chol Calc (NIH): 48 mg/dL (ref 0–99)
Triglycerides: 120 mg/dL (ref 0–149)
VLDL Cholesterol Cal: 21 mg/dL (ref 5–40)

## 2022-11-27 LAB — CBC WITH DIFFERENTIAL/PLATELET
Basophils Absolute: 0 10*3/uL (ref 0.0–0.2)
Basos: 0 %
EOS (ABSOLUTE): 0.1 10*3/uL (ref 0.0–0.4)
Eos: 1 %
Hematocrit: 31.2 % — ABNORMAL LOW (ref 34.0–46.6)
Hemoglobin: 10.5 g/dL — ABNORMAL LOW (ref 11.1–15.9)
Immature Grans (Abs): 0 10*3/uL (ref 0.0–0.1)
Immature Granulocytes: 0 %
Lymphocytes Absolute: 2.2 10*3/uL (ref 0.7–3.1)
Lymphs: 32 %
MCH: 31 pg (ref 26.6–33.0)
MCHC: 33.7 g/dL (ref 31.5–35.7)
MCV: 92 fL (ref 79–97)
Monocytes Absolute: 0.6 10*3/uL (ref 0.1–0.9)
Monocytes: 9 %
Neutrophils Absolute: 4 10*3/uL (ref 1.4–7.0)
Neutrophils: 58 %
Platelets: 210 10*3/uL (ref 150–450)
RBC: 3.39 x10E6/uL — ABNORMAL LOW (ref 3.77–5.28)
RDW: 13.5 % (ref 11.7–15.4)
WBC: 7 10*3/uL (ref 3.4–10.8)

## 2022-11-27 LAB — FERRITIN: Ferritin: 388 ng/mL — ABNORMAL HIGH (ref 15–150)

## 2022-11-27 LAB — IRON AND TIBC
Iron Saturation: 36 % (ref 15–55)
Iron: 81 ug/dL (ref 27–139)
Total Iron Binding Capacity: 224 ug/dL — ABNORMAL LOW (ref 250–450)
UIBC: 143 ug/dL (ref 118–369)

## 2022-11-27 LAB — TSH: TSH: 2.07 u[IU]/mL (ref 0.450–4.500)

## 2022-11-27 LAB — VITAMIN B12: Vitamin B-12: 154 pg/mL — ABNORMAL LOW (ref 232–1245)

## 2022-11-28 ENCOUNTER — Other Ambulatory Visit: Payer: Self-pay | Admitting: Family Medicine

## 2022-11-28 DIAGNOSIS — E538 Deficiency of other specified B group vitamins: Secondary | ICD-10-CM

## 2022-11-28 MED ORDER — CYANOCOBALAMIN 1000 MCG/ML IJ SOLN
1000.0000 ug | INTRAMUSCULAR | Status: AC
Start: 2022-12-28 — End: 2023-12-22

## 2022-11-28 MED ORDER — CYANOCOBALAMIN 1000 MCG/ML IJ SOLN
1000.0000 ug | INTRAMUSCULAR | Status: AC
Start: 2022-11-28 — End: 2022-12-26

## 2022-12-03 ENCOUNTER — Telehealth: Payer: Self-pay

## 2022-12-03 ENCOUNTER — Other Ambulatory Visit: Payer: Self-pay | Admitting: Family Medicine

## 2022-12-03 MED ORDER — CLOPIDOGREL BISULFATE 75 MG PO TABS: 75.0000 mg | ORAL_TABLET | Freq: Every day | ORAL | 0 refills | Status: DC

## 2022-12-03 NOTE — Telephone Encounter (Signed)
Medication Refill - Medication: clopidogrel (PLAVIX) 75 MG tablet  Has the patient contacted their pharmacy? Yes.   Pharmacy advised pt to reach out to PCP.   Preferred Pharmacy (with phone number or street name): [facility] [place], [place] - [facility] AT Portal to [facility]  Phone: [phone] Fax: [phone]  Has the patient been seen for an appointment in the last year OR does the patient have an upcoming appointment? Yes.    Agent: Please be advised that RX refills may take up to 3 business days. We ask that you follow-up with your pharmacy.

## 2022-12-03 NOTE — Telephone Encounter (Signed)
-----   Message from Dorcas Carrow, [place] sent at 11/26/2022 10:39 AM EDT ----- Make sure Presli got her zio monitor please

## 2022-12-03 NOTE — Telephone Encounter (Signed)
Requested Prescriptions  Pending Prescriptions Disp Refills   clopidogrel (PLAVIX) 75 MG tablet 90 tablet 0    Sig: Take 1 tablet (75 mg total) by mouth daily.     Hematology: Antiplatelets - clopidogrel Failed - 12/03/2022 11:20 AM      Failed - HCT in normal range and within 180 days    Hematocrit  Date Value Ref Range Status  11/26/2022 31.2 (L) 34.0 - 46.6 % Final         Failed - HGB in normal range and within 180 days    Hemoglobin  Date Value Ref Range Status  11/26/2022 10.5 (L) 11.1 - 15.9 g/dL Final         Failed - Cr in normal range and within 360 days    Creatinine, Ser  Date Value Ref Range Status  11/26/2022 1.22 (H) 0.57 - 1.00 mg/dL Final         Passed - PLT in normal range and within 180 days    Platelets  Date Value Ref Range Status  11/26/2022 210 150 - 450 x10E3/uL Final         Passed - Valid encounter within last 6 months    Recent Outpatient Visits           1 week ago Palpitations   [place] [facility] [place], Megan P, DO   4 months ago 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)   [place] [facility] [place], Megan P, DO   5 months ago 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)   [place] [facility] [place], Megan P, DO   6 months ago Routine general medical examination at a health care facility   [facility] [place], [place] P, DO   6 months ago Postherpetic neuralgia   [place] [facility] [place], Oralia Rud, DO       Future Appointments             In 1 month Laural Benes, Oralia Rud, DO [place] [facility], [facility]

## 2022-12-03 NOTE — Telephone Encounter (Signed)
Entered in error

## 2022-12-04 ENCOUNTER — Telehealth: Payer: Self-pay

## 2022-12-04 NOTE — Telephone Encounter (Signed)
Copied from CRM [id number]. Topic: General - Other >> Dec 04, 2022  4:56 PM [id number] J wrote: Reason for CRM: pt called in to follow up on conversation with CMA regarding taking the pill for B-12 instead of the injections. Made pt aware of providers response. Pt says that she hasn't take the oral b12 in atleast a year. Pt says that she is not currently taking oral.   Please advise pt further.

## 2022-12-05 ENCOUNTER — Other Ambulatory Visit: Payer: Self-pay | Admitting: Family Medicine

## 2022-12-05 NOTE — Telephone Encounter (Signed)
Spoke with patient to make her aware of Dr Henriette Combs recommendations. Patient verbalized understanding and has no further questions at this time.

## 2022-12-05 NOTE — Telephone Encounter (Signed)
OK to start oral pills and we'll recheck next time.

## 2022-12-05 NOTE — Telephone Encounter (Signed)
Requested Prescriptions  Pending Prescriptions Disp Refills   chlorthalidone (HYGROTON) 25 MG tablet [Pharmacy Med Name: CHLORTHALID  TAB ] 90 tablet 0    Sig: TAKE 1 TABLET DAILY     Cardiovascular: Diuretics - Thiazide Failed - 12/05/2022  2:21 AM      Failed - Cr in normal range and within 180 days    Creatinine, Ser  Date Value Ref Range Status  11/26/2022 1.22 (H) 0.57 - 1.00 mg/dL Final         Failed - Na in normal range and within 180 days    Sodium  Date Value Ref Range Status  11/26/2022 145 (H) 134 - 144 mmol/L Final         Passed - K in normal range and within 180 days    Potassium  Date Value Ref Range Status  11/26/2022 4.8 3.5 - 5.2 mmol/L Final         Passed - Last BP in normal range    BP Readings from Last 1 Encounters:  11/26/22 (!) 138/50         Passed - Valid encounter within last 6 months    Recent Outpatient Visits           1 week ago Palpitations   [facility] [facility] [place], Megan P, DO   4 months ago 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)   [place] [facility] [place], Megan P, DO   5 months ago 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)   [place] [facility] [place], Megan P, DO   6 months ago Routine general medical examination at a health care facility   [facility] [address], Megan P, DO   7 months ago Postherpetic neuralgia   [place] [place], [place], DO       Future Appointments             In 1 month Lader, Oralia Rud, DO [place] [facility], [facility]             SYNTHROID 50 MCG tablet [Pharmacy Med Name: SYNTHROID TAB 50MCG] 90 tablet 0    Sig: TAKE 1 TABLET DAILY     Endocrinology:  Hypothyroid Agents Passed - 12/05/2022  2:21 AM      Passed - TSH in normal range and within 360 days    TSH  Date Value Ref Range Status  11/26/2022 2.070  0.450 - 4.500 uIU/mL Final         Passed - Valid encounter within last 12 months    Recent Outpatient Visits           1 week ago Palpitations   [place] [facility] [place], Megan P, DO   4 months ago 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)   [place] [facility] [place], Megan P, DO   5 months ago 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)   [place] [facility] [place], Megan P, DO   6 months ago Routine general medical examination at a health care facility   [facility] [place], Megan P, DO   7 months ago Postherpetic neuralgia   [place] [facility] Dorcas Carrow, DO       Future Appointments  In 1 month Kohan, Barb Merino, DO [place], [facility]

## 2022-12-09 ENCOUNTER — Inpatient Hospital Stay: Payer: No Typology Code available for payment source | Attending: Oncology

## 2022-12-09 DIAGNOSIS — D649 Anemia, unspecified: Secondary | ICD-10-CM | POA: Diagnosis not present

## 2022-12-09 DIAGNOSIS — D509 Iron deficiency anemia, unspecified: Secondary | ICD-10-CM | POA: Insufficient documentation

## 2022-12-09 LAB — CBC WITH DIFFERENTIAL/PLATELET
Abs Immature Granulocytes: 0.02 10*3/uL (ref 0.00–0.07)
Basophils Absolute: 0 10*3/uL (ref 0.0–0.1)
Basophils Relative: 0 %
Eosinophils Absolute: 0.1 10*3/uL (ref 0.0–0.5)
Eosinophils Relative: 1 %
HCT: 33.2 % — ABNORMAL LOW (ref 36.0–46.0)
Hemoglobin: 10.7 g/dL — ABNORMAL LOW (ref 12.0–15.0)
Immature Granulocytes: 0 %
Lymphocytes Relative: 30 %
Lymphs Abs: 1.8 10*3/uL (ref 0.7–4.0)
MCH: 31 pg (ref 26.0–34.0)
MCHC: 32.2 g/dL (ref 30.0–36.0)
MCV: 96.2 fL (ref 80.0–100.0)
Monocytes Absolute: 0.5 10*3/uL (ref 0.1–1.0)
Monocytes Relative: 7 %
Neutro Abs: 3.7 10*3/uL (ref 1.7–7.7)
Neutrophils Relative %: 62 %
Platelets: 245 10*3/uL (ref 150–400)
RBC: 3.45 MIL/uL — ABNORMAL LOW (ref 3.87–5.11)
RDW: 13.3 % (ref 11.5–15.5)
WBC: 6.1 10*3/uL (ref 4.0–10.5)
nRBC: 0 % (ref 0.0–0.2)

## 2022-12-09 LAB — FERRITIN: Ferritin: 290 ng/mL (ref 11–307)

## 2022-12-09 LAB — IRON AND TIBC
Iron: 85 ug/dL (ref 28–170)
Saturation Ratios: 32 % — ABNORMAL HIGH (ref 10.4–31.8)
TIBC: 266 ug/dL (ref 250–450)
UIBC: 181 ug/dL

## 2022-12-09 LAB — VITAMIN B12: Vitamin B-12: 358 pg/mL (ref 180–914)

## 2022-12-10 ENCOUNTER — Telehealth: Payer: Self-pay

## 2022-12-10 NOTE — Telephone Encounter (Signed)
-----   Message from Megan P Demmer, DO sent at 11/26/2022 10:39 AM EDT ----- Make sure Kenly got her zio monitor please  

## 2022-12-10 NOTE — Telephone Encounter (Signed)
Spoke with patient and she says she did received her 1st monitor and it was defective and she returned it. They have sent her a replacement and she received it today and says she will put it on either tonight or tomorrow.

## 2022-12-11 DIAGNOSIS — R002 Palpitations: Secondary | ICD-10-CM | POA: Diagnosis not present

## 2022-12-25 ENCOUNTER — Other Ambulatory Visit: Payer: Self-pay | Admitting: Family Medicine

## 2022-12-25 MED ORDER — ROSUVASTATIN CALCIUM 10 MG PO TABS: 10.0000 mg | ORAL_TABLET | Freq: Every day | ORAL | 1 refills | Status: DC

## 2022-12-25 NOTE — Telephone Encounter (Signed)
Medication Refill - Medication: rosuvastatin (CRESTOR) 10 MG tablet [[id number]]   Has the patient contacted their pharmacy? Yes.    (Agent: If yes, when and what did the pharmacy advise?) Contact PCP out of refills   Preferred Pharmacy (with phone number or street name): [facility] [place], [place] - [facility] AT Portal to [facility] Sites   Has the patient been seen for an appointment in the last year OR does the patient have an upcoming appointment? Yes.    Agent: Please be advised that RX refills may take up to 3 business days. We ask that you follow-up with your pharmacy.

## 2022-12-25 NOTE — Telephone Encounter (Signed)
Labs in date  Requested Prescriptions  Pending Prescriptions Disp Refills   rosuvastatin (CRESTOR) 10 MG tablet 90 tablet 1    Sig: Take 1 tablet (10 mg total) by mouth daily.     Cardiovascular:  Antilipid - Statins 2 Failed - 12/25/2022 10:03 AM      Failed - Cr in normal range and within 360 days    Creatinine, Ser  Date Value Ref Range Status  11/26/2022 1.22 (H) 0.57 - 1.00 mg/dL Final         Failed - Lipid Panel in normal range within the last 12 months    Cholesterol, Total  Date Value Ref Range Status  11/26/2022 119 100 - 199 mg/dL Final   Cholesterol Piccolo, Waived  Date Value Ref Range Status  07/04/2015 147 <200 mg/dL Final    Comment:                            Desirable                <200                         Borderline High      200- 239                         High                     >239    LDL Chol Calc (NIH)  Date Value Ref Range Status  11/26/2022 48 0 - 99 mg/dL Final   HDL  Date Value Ref Range Status  11/26/2022 50 >39 mg/dL Final   Triglycerides  Date Value Ref Range Status  11/26/2022 120 0 - 149 mg/dL Final   Triglycerides Piccolo,Waived  Date Value Ref Range Status  07/04/2015 89 <150 mg/dL Final    Comment:                            Normal                   <150                         Borderline High     150 - 199                         High                200 - 499                         Very High                >499          Passed - Patient is not pregnant      Passed - Valid encounter within last 12 months    Recent Outpatient Visits           4 weeks ago Palpitations   [place] [facility] [place], Megan P, DO   5 months ago 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)   [place] [facility] [place], Megan P, DO   6 months ago Hypertensive kidney disease with stage  3a chronic kidney disease (HCC)   [place] [facility] [place], Megan P,  DO   6 months ago Routine general medical examination at a health care facility   [facility] [place], [place] P, DO   7 months ago Postherpetic neuralgia   [place] [facility] Dorcas Carrow, DO       Future Appointments             In 2 weeks Laural Benes, Oralia Rud, DO [facility], [facility]

## 2022-12-28 DIAGNOSIS — R002 Palpitations: Secondary | ICD-10-CM | POA: Diagnosis not present

## 2023-01-01 ENCOUNTER — Other Ambulatory Visit: Payer: Self-pay | Admitting: Family Medicine

## 2023-01-01 NOTE — Telephone Encounter (Signed)
Refilled 12/25/22 #90 with 1 refill. Requested Prescriptions  Refused Prescriptions Disp Refills   rosuvastatin (CRESTOR) 10 MG tablet 90 tablet 1    Sig: Take 1 tablet (10 mg total) by mouth daily.     Cardiovascular:  Antilipid - Statins 2 Failed - 01/01/2023 10:39 AM      Failed - Cr in normal range and within 360 days    Creatinine, Ser  Date Value Ref Range Status  11/26/2022 1.22 (H) 0.57 - 1.00 mg/dL Final         Failed - Lipid Panel in normal range within the last 12 months    Cholesterol, Total  Date Value Ref Range Status  11/26/2022 119 100 - 199 mg/dL Final   Cholesterol Piccolo, Waived  Date Value Ref Range Status  07/04/2015 147 <200 mg/dL Final    Comment:                            Desirable                <200                         Borderline High      200- 239                         High                     >239    LDL Chol Calc (NIH)  Date Value Ref Range Status  11/26/2022 48 0 - 99 mg/dL Final   HDL  Date Value Ref Range Status  11/26/2022 50 >39 mg/dL Final   Triglycerides  Date Value Ref Range Status  11/26/2022 120 0 - 149 mg/dL Final   Triglycerides Piccolo,Waived  Date Value Ref Range Status  07/04/2015 89 <150 mg/dL Final    Comment:                            Normal                   <150                         Borderline High     150 - 199                         High                200 - 499                         Very High                >499          Passed - Patient is not pregnant      Passed - Valid encounter within last 12 months    Recent Outpatient Visits           1 month ago Palpitations   [place] [facility] [place], Megan P, DO   5 months ago 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)   [place] [facility] [place], Megan P, DO   6 months ago Hypertensive kidney disease  with stage 3a chronic kidney disease (HCC)   [place] [facility] [place], Megan P, DO   7 months ago Routine general medical examination at a health care facility   [place] [facility] [place], [place] P, DO   7 months ago Postherpetic neuralgia   [place] [facility] Dorcas Carrow, DO       Future Appointments             In 1 week Laural Benes, Oralia Rud, DO [place] [facility], [facility]

## 2023-01-01 NOTE — Telephone Encounter (Signed)
Medication Refill - Medication: rosuvastatin (CRESTOR) 10 MG tablet   Has the patient contacted their pharmacy? Yes.      Preferred Pharmacy (with phone number or street name): [facility] [place], [place] - [facility] AT Portal to [facility]istered [facility] Sites  Phone: [phone] Fax: [phone]  Has the patient been seen for an appointment in the last year OR does the patient have an upcoming appointment? Yes.    Agent: Please be advised that RX refills may take up to 3 business days. We ask that you follow-up with your pharmacy.

## 2023-01-08 ENCOUNTER — Ambulatory Visit: Payer: No Typology Code available for payment source | Admitting: Family Medicine

## 2023-01-08 ENCOUNTER — Encounter: Payer: Self-pay | Admitting: Family Medicine

## 2023-01-08 VITALS — BP 126/70 | HR 48 | Temp 98.6°F | Ht 61.0 in | Wt 156.6 lb

## 2023-01-08 DIAGNOSIS — E538 Deficiency of other specified B group vitamins: Secondary | ICD-10-CM | POA: Diagnosis not present

## 2023-01-08 DIAGNOSIS — Z7984 Long term (current) use of oral hypoglycemic drugs: Secondary | ICD-10-CM | POA: Diagnosis not present

## 2023-01-08 DIAGNOSIS — N1831 Chronic kidney disease, stage 3a: Secondary | ICD-10-CM

## 2023-01-08 DIAGNOSIS — E1122 Type 2 diabetes mellitus with diabetic chronic kidney disease: Secondary | ICD-10-CM | POA: Diagnosis not present

## 2023-01-08 LAB — BAYER DCA HB A1C WAIVED: HB A1C (BAYER DCA - WAIVED): 6.5 % — ABNORMAL HIGH (ref 4.8–5.6)

## 2023-01-08 NOTE — Assessment & Plan Note (Signed)
Rechecking labs today. Await results. Treat as needed.  °

## 2023-01-08 NOTE — Assessment & Plan Note (Signed)
Doing well with A1c of 6.0. Continue current regimen. Continue to monitor. Call with any concerns.  

## 2023-01-08 NOTE — Progress Notes (Signed)
BP 126/70   Pulse (!) 48   Temp 98.6 F (37 C) (Oral)   Ht 5\' 1"  (1.549 m)   Wt 156 lb 9.6 oz (71 kg)   LMP  (LMP Unknown)   SpO2 98%   BMI 29.59 kg/m    Subjective:    Patient ID: Teresa [name], female    DOB: August 12, 1946, 77 y.o.   MRN: [id number]  HPI: Teresa [name] is a 77 y.o. female  Chief Complaint  Patient presents with   Diabetes   Hypertension   Hypothyroidism   DIABETES Hypoglycemic episodes:no Polydipsia/polyuria: no Visual disturbance: no Chest pain: no Paresthesias: no Glucose Monitoring: no  Accucheck frequency: Not Checking Taking Insulin?: no Blood Pressure Monitoring: not checking Retinal Examination: Up to Date Foot Exam: Up to Date Diabetic Education: Completed Pneumovax: Up to Date Influenza: Up to Date Aspirin: yes  ANEMIA Anemia status: better Etiology of anemia: B12 and iron deficiency Duration of anemia treatment: months Compliance with treatment: good compliance Iron supplementation side effects: no Severity of anemia: mild Fatigue: yes Decreased exercise tolerance: no  Dyspnea on exertion: no Palpitations: no Bleeding: no Pica: no   Relevant past medical, surgical, family and social history reviewed and updated as indicated. Interim medical history since our last visit reviewed. Allergies and medications reviewed and updated.  Review of Systems  Constitutional: Negative.   Respiratory: Negative.    Cardiovascular: Negative.   Gastrointestinal: Negative.   Musculoskeletal: Negative.   Neurological: Negative.   Psychiatric/Behavioral: Negative.      Per HPI unless specifically indicated above     Objective:    BP 126/70   Pulse (!) 48   Temp 98.6 F (37 C) (Oral)   Ht 5\' 1"  (1.549 m)   Wt 156 lb 9.6 oz (71 kg)   LMP  (LMP Unknown)   SpO2 98%   BMI 29.59 kg/m   Wt Readings from Last 3 Encounters:  01/08/23 156 lb 9.6 oz (71 kg)  11/26/22 159 lb 1.6 oz (72.2 kg)  09/09/22 161 lb (73 kg)     Physical Exam Vitals and nursing note reviewed.  Constitutional:      General: She is not in acute distress.    Appearance: Normal appearance. She is not ill-appearing, toxic-appearing or diaphoretic.  HENT:     Head: Normocephalic and atraumatic.     Right Ear: External ear normal.     Left Ear: External ear normal.     Nose: Nose normal.     Mouth/Throat:     Mouth: Mucous membranes are moist.     Pharynx: Oropharynx is clear.  Eyes:     General: No scleral icterus.       Right eye: No discharge.        Left eye: No discharge.     Extraocular Movements: Extraocular movements intact.     Conjunctiva/sclera: Conjunctivae normal.     Pupils: Pupils are equal, round, and reactive to light.  Cardiovascular:     Rate and Rhythm: Normal rate and regular rhythm.     Pulses: Normal pulses.     Heart sounds: Normal heart sounds. No murmur heard.    No friction rub. No gallop.  Pulmonary:     Effort: Pulmonary effort is normal. No respiratory distress.     Breath sounds: Normal breath sounds. No stridor. No wheezing, rhonchi or rales.  Chest:     Chest wall: No tenderness.  Musculoskeletal:  General: Normal range of motion.     Cervical back: Normal range of motion and neck supple.  Skin:    General: Skin is warm and dry.     Capillary Refill: Capillary refill takes less than 2 seconds.     Coloration: Skin is not jaundiced or pale.     Findings: No bruising, erythema, lesion or rash.  Neurological:     General: No focal deficit present.     Mental Status: She is alert and oriented to person, place, and time. Mental status is at baseline.  Psychiatric:        Mood and Affect: Mood normal.        Behavior: Behavior normal.        Thought Content: Thought content normal.        Judgment: Judgment normal.     Results for orders placed or performed in visit on 01/08/23  Bayer DCA Hb A1c Waived  Result Value Ref Range   HB A1C (BAYER DCA - WAIVED) 6.5 (H) 4.8 - 5.6 %       Assessment & Plan:   Problem List Items Addressed This Visit       Endocrine   Type 2 diabetes mellitus with stage 3 chronic kidney disease, without long-term current use of insulin (HCC) - Primary    Doing well with A1c of 6.0. Continue current regimen. Continue to monitor. Call with any concerns.       Relevant Orders   Bayer DCA Hb A1c Waived (Completed)     Other   B12 deficiency    Rechecking labs today. Await results. Treat as needed.       Relevant Orders   B12   CBC with Differential/Platelet     Follow up plan: Return in about 3 months (around 04/10/2023).

## 2023-01-09 LAB — CBC WITH DIFFERENTIAL/PLATELET
Basophils Absolute: 0 10*3/uL (ref 0.0–0.2)
Basos: 1 %
EOS (ABSOLUTE): 0.1 10*3/uL (ref 0.0–0.4)
Eos: 1 %
Hematocrit: 33.8 % — ABNORMAL LOW (ref 34.0–46.6)
Hemoglobin: 11.1 g/dL (ref 11.1–15.9)
Immature Grans (Abs): 0 10*3/uL (ref 0.0–0.1)
Immature Granulocytes: 0 %
Lymphocytes Absolute: 2.2 10*3/uL (ref 0.7–3.1)
Lymphs: 35 %
MCH: 31.5 pg (ref 26.6–33.0)
MCHC: 32.8 g/dL (ref 31.5–35.7)
MCV: 96 fL (ref 79–97)
Monocytes Absolute: 0.5 10*3/uL (ref 0.1–0.9)
Monocytes: 7 %
Neutrophils Absolute: 3.5 10*3/uL (ref 1.4–7.0)
Neutrophils: 56 %
Platelets: 204 10*3/uL (ref 150–450)
RBC: 3.52 x10E6/uL — ABNORMAL LOW (ref 3.77–5.28)
RDW: 13.1 % (ref 11.7–15.4)
WBC: 6.2 10*3/uL (ref 3.4–10.8)

## 2023-01-09 LAB — VITAMIN B12: Vitamin B-12: 1245 pg/mL (ref 232–1245)

## 2023-02-06 ENCOUNTER — Other Ambulatory Visit: Payer: Self-pay | Admitting: Family Medicine

## 2023-02-06 DIAGNOSIS — N1831 Hypertensive chronic kidney disease with stage 1 through stage 4 chronic kidney disease, or unspecified chronic kidney disease: Secondary | ICD-10-CM

## 2023-02-06 NOTE — Telephone Encounter (Signed)
Requested Prescriptions  Pending Prescriptions Disp Refills   lisinopril (ZESTRIL) 30 MG tablet [Pharmacy Med Name: LISINOPRIL TAB 30MG ] 90 tablet 0    Sig: TAKE 1 TABLET DAILY     Cardiovascular:  ACE Inhibitors Failed - 02/06/2023  2:15 AM      Failed - Cr in normal range and within 180 days    Creatinine, Ser  Date Value Ref Range Status  11/26/2022 1.22 (H) 0.57 - 1.00 mg/dL Final         Passed - K in normal range and within 180 days    Potassium  Date Value Ref Range Status  11/26/2022 4.8 3.5 - 5.2 mmol/L Final         Passed - Patient is not pregnant      Passed - Last BP in normal range    BP Readings from Last 1 Encounters:  01/08/23 126/70         Passed - Valid encounter within last 6 months    Recent Outpatient Visits           4 weeks ago Type 2 diabetes mellitus with stage 3a chronic kidney disease, without long-term current use of insulin (HCC)   [place] [facility] [place], [place], DO   2 months ago Palpitations   [place] [facility] [place], Megan P, DO   6 months ago 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)   [place] [facility] [place], Megan P, DO   7 months ago 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)   [place] [facility] [place], Megan P, DO   8 months ago Routine general medical examination at a health care facility   [facility] [place], [place] P, DO       Future Appointments             In 2 months Laural Benes, Megan P, DO  [facility]             metFORMIN (GLUCOPHAGE-XR) 500 MG 24 hr tablet [Pharmacy Med Name: METFORMIN ER TAB 500MG  GP] 360 tablet 0    Sig: TAKE 2 TABLETS TWICE A DAY     Endocrinology:  Diabetes - Biguanides Failed - 02/06/2023  2:15 AM      Failed - Cr in normal range and within 360 days    Creatinine, Ser  Date Value Ref Range Status   11/26/2022 1.22 (H) 0.57 - 1.00 mg/dL Final         Failed - eGFR in normal range and within 360 days    GFR calc Af Amer  Date Value Ref Range Status  05/23/2020 79 >59 mL/min/1.73 Final    Comment:    **[facility] currently reports eGFR in compliance with the current**   recommendations of the [employer]. [facility] will   update reporting as new guidelines are published from the NKF-ASN   Task force.    GFR, Estimated  Date Value Ref Range Status  04/17/2021 53 (L) >60 mL/min Final    Comment:    (NOTE) Calculated using the CKD-EPI Creatinine Equation (2021)    eGFR  Date Value Ref Range Status  11/26/2022 46 (L) >59 mL/min/1.73 Final         Passed - HBA1C is between 0 and 7.9 and within 180 days    HB A1C (BAYER DCA - WAIVED)  Date Value Ref Range Status  01/08/2023 6.5 (H) 4.8 - 5.6 % Final    Comment:             Prediabetes: 5.7 - 6.4          Diabetes: >6.4          Glycemic control for adults with diabetes: <7.0          Passed - B12 Level in normal range and within 720 days    Vitamin B-12  Date Value Ref Range Status  01/08/2023 1,245 232 - 1,245 pg/mL Final         Passed - Valid encounter within last 6 months    Recent Outpatient Visits           4 weeks ago Type 2 diabetes mellitus with stage 3a chronic kidney disease, without long-term current use of insulin (HCC)   [place] [facility] [place], Megan P, DO   2 months ago Palpitations   [place] [facility] [place], Megan P, DO   6 months ago 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)   [place] [facility] [place], Megan P, DO   7 months ago 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)   [place] [facility] [place], Megan P, DO   8 months ago Routine general medical examination at a health care facility   [facility] [place], [place] P, DO        Future Appointments             In 2 months Dorcas Carrow, DO [place] [facility]            Passed - CBC within normal limits and completed in the last 12 months    WBC  Date Value Ref Range Status  01/08/2023 6.2 3.4 - 10.8 x10E3/uL Final  12/09/2022 6.1 4.0 - 10.5 K/uL Final   RBC  Date Value Ref Range Status  01/08/2023 3.52 (L) 3.77 - 5.28 x10E6/uL Final  12/09/2022 3.45 (L) 3.87 - 5.11 MIL/uL Final   Hemoglobin  Date Value Ref Range Status  01/08/2023 11.1 11.1 - 15.9 g/dL Final   Hematocrit  Date Value Ref Range Status  01/08/2023 33.8 (L) 34.0 - 46.6 % Final   MCHC  Date Value Ref Range Status  01/08/2023 32.8 31.5 - 35.7 g/dL Final  29/56/2130 86.5 30.0 - 36.0 g/dL Final   [facility]  Date Value Ref Range Status  01/08/2023 31.5 26.6 - 33.0 pg Final  12/09/2022 31.0 26.0 - 34.0 pg Final   MCV  Date Value Ref Range Status  01/08/2023 96 79 - 97 fL Final   No results found for: "PLTCOUNTKUC", "LABPLAT", "POCPLA" RDW  Date Value Ref Range Status  01/08/2023 13.1 11.7 - 15.4 % Final          clopidogrel (PLAVIX) 75 MG tablet [Pharmacy Med Name: CLOPIDOGREL  TAB 75MG ] 90 tablet 0    Sig: TAKE 1 TABLET DAILY     Hematology: Antiplatelets - clopidogrel Failed - 02/06/2023  2:15 AM      Failed - HCT in normal range and within 180 days    Hematocrit  Date Value Ref Range Status  01/08/2023 33.8 (L) 34.0 - 46.6 % Final         Failed - Cr in normal range and within 360 days    Creatinine, Ser  Date Value Ref Range Status  11/26/2022 1.22 (  H) 0.57 - 1.00 mg/dL Final         Passed - HGB in normal range and within 180 days    Hemoglobin  Date Value Ref Range Status  01/08/2023 11.1 11.1 - 15.9 g/dL Final         Passed - PLT in normal range and within 180 days    Platelets  Date Value Ref Range Status  01/08/2023 204 150 - 450 x10E3/uL Final         Passed - Valid encounter within last 6 months    Recent  Outpatient Visits           4 weeks ago Type 2 diabetes mellitus with stage 3a chronic kidney disease, without long-term current use of insulin (HCC)   [place] [facility] [place], [place], DO   2 months ago Palpitations   [place] [facility] [place], Megan P, DO   6 months ago 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)   [place] [facility] [place], Megan P, DO   7 months ago 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)   [place] [facility] [place], Megan P, DO   8 months ago Routine general medical examination at a health care facility   [facility] Dorcas Carrow, DO       Future Appointments             In 2 months Laural Benes, Oralia Rud, DO [facility]

## 2023-02-26 ENCOUNTER — Other Ambulatory Visit: Payer: Self-pay | Admitting: Family Medicine

## 2023-02-26 NOTE — Telephone Encounter (Signed)
Requested Prescriptions  Pending Prescriptions Disp Refills   chlorthalidone (HYGROTON) 25 MG tablet [Pharmacy Med Name: CHLORTHALID  TAB 25MG ] 90 tablet 0    Sig: TAKE 1 TABLET DAILY     Cardiovascular: Diuretics - Thiazide Failed - 02/26/2023  8:10 AM      Failed - Cr in normal range and within 180 days    Creatinine, Ser  Date Value Ref Range Status  11/26/2022 1.22 (H) 0.57 - 1.00 mg/dL Final         Failed - Na in normal range and within 180 days    Sodium  Date Value Ref Range Status  11/26/2022 145 (H) 134 - 144 mmol/L Final         Passed - K in normal range and within 180 days    Potassium  Date Value Ref Range Status  11/26/2022 4.8 3.5 - 5.2 mmol/L Final         Passed - Last BP in normal range    BP Readings from Last 1 Encounters:  01/08/23 126/70         Passed - Valid encounter within last 6 months    Recent Outpatient Visits           1 month ago Type 2 diabetes mellitus with stage 3a chronic kidney disease, without long-term current use of insulin (HCC)   [place] [facility] [place], [place], DO   3 months ago Palpitations   [place] [facility] [place], Megan P, DO   7 months ago 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)   [place] [facility] [place], Megan P, DO   8 months ago 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)   [place] [facility] [place], Megan P, DO   9 months ago Routine general medical examination at a health care facility   [facility] [place], Oralia Rud, DO       Future Appointments             In 1 month Laural Benes, Oralia Rud, DO [place] [facility]             SYNTHROID 50 MCG tablet [Pharmacy Med Name: SYNTHROID TAB 50MCG] 90 tablet 0    Sig: TAKE 1 TABLET DAILY     Endocrinology:  Hypothyroid Agents Passed - 02/26/2023  8:10 AM      Passed - TSH in normal  range and within 360 days    TSH  Date Value Ref Range Status  11/26/2022 2.070 0.450 - 4.500 uIU/mL Final         Passed - Valid encounter within last 12 months    Recent Outpatient Visits           1 month ago Type 2 diabetes mellitus with stage 3a chronic kidney disease, without long-term current use of insulin (HCC)   [place] [facility] [place], [place], DO   3 months ago Palpitations   [place] [facility] [place], Megan P, DO   7 months ago 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)   [place] [facility] [place], Megan P, DO   8 months ago 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)   [place] [facility] [place], Megan P, DO   9 months ago Routine general medical examination at a health care facility  [place] [facility] [place], Oralia Rud, DO       Future Appointments             In 1 month Laural Benes, Oralia Rud, DO [place] [facility]

## 2023-03-10 ENCOUNTER — Encounter: Payer: Self-pay | Admitting: Oncology

## 2023-03-10 ENCOUNTER — Inpatient Hospital Stay: Payer: No Typology Code available for payment source | Attending: Oncology

## 2023-03-10 ENCOUNTER — Inpatient Hospital Stay (HOSPITAL_BASED_OUTPATIENT_CLINIC_OR_DEPARTMENT_OTHER): Payer: No Typology Code available for payment source | Admitting: Oncology

## 2023-03-10 VITALS — BP 140/60 | HR 59 | Temp 96.5°F | Resp 18 | Ht 61.0 in | Wt 157.6 lb

## 2023-03-10 DIAGNOSIS — D508 Other iron deficiency anemias: Secondary | ICD-10-CM

## 2023-03-10 DIAGNOSIS — Z87891 Personal history of nicotine dependence: Secondary | ICD-10-CM | POA: Insufficient documentation

## 2023-03-10 DIAGNOSIS — D509 Iron deficiency anemia, unspecified: Secondary | ICD-10-CM | POA: Diagnosis not present

## 2023-03-10 DIAGNOSIS — D638 Anemia in other chronic diseases classified elsewhere: Secondary | ICD-10-CM | POA: Diagnosis not present

## 2023-03-10 DIAGNOSIS — D649 Anemia, unspecified: Secondary | ICD-10-CM

## 2023-03-10 LAB — CBC WITH DIFFERENTIAL/PLATELET
Abs Immature Granulocytes: 0.02 10*3/uL (ref 0.00–0.07)
Basophils Absolute: 0 10*3/uL (ref 0.0–0.1)
Basophils Relative: 0 %
Eosinophils Absolute: 0.1 10*3/uL (ref 0.0–0.5)
Eosinophils Relative: 1 %
HCT: 32.6 % — ABNORMAL LOW (ref 36.0–46.0)
Hemoglobin: 10.5 g/dL — ABNORMAL LOW (ref 12.0–15.0)
Immature Granulocytes: 0 %
Lymphocytes Relative: 29 %
Lymphs Abs: 2 10*3/uL (ref 0.7–4.0)
MCH: 31.1 pg (ref 26.0–34.0)
MCHC: 32.2 g/dL (ref 30.0–36.0)
MCV: 96.4 fL (ref 80.0–100.0)
Monocytes Absolute: 0.5 10*3/uL (ref 0.1–1.0)
Monocytes Relative: 8 %
Neutro Abs: 4.3 10*3/uL (ref 1.7–7.7)
Neutrophils Relative %: 62 %
Platelets: 227 10*3/uL (ref 150–400)
RBC: 3.38 MIL/uL — ABNORMAL LOW (ref 3.87–5.11)
RDW: 12 % (ref 11.5–15.5)
WBC: 6.9 10*3/uL (ref 4.0–10.5)
nRBC: 0 % (ref 0.0–0.2)

## 2023-03-10 LAB — FERRITIN: Ferritin: 160 ng/mL (ref 11–307)

## 2023-03-10 LAB — IRON AND TIBC
Iron: 83 ug/dL (ref 28–170)
Saturation Ratios: 29 % (ref 10.4–31.8)
TIBC: 290 ug/dL (ref 250–450)
UIBC: 207 ug/dL

## 2023-03-10 NOTE — Progress Notes (Signed)
Hematology/Oncology Consult note [facility]  [phone] [phone]  Patient Care Team: Dorcas Carrow, DO as PCP - General (Family Medicine) Gabriel Cirri, NP (Inactive) as PCP - Family Medicine (Nurse Practitioner) Debbe Odea, MD as PCP - Cardiology (Cardiology) Merri Ray, MD as Referring Physician (Physical Medicine and Rehabilitation) Creig Hines, MD as Consulting Physician (Hematology and Oncology)   Name of the patient: Teresa [name]  [id number]  1946-01-14   Date of visit: 03/10/23  Diagnosis- normocytic anemia likely secondary to chronic disease with a component of iron deficiency   Chief complaint/ Reason for visit- routine f/u of anemia  Heme/Onc history:  Patient is a 77 year old female with a past medical history significant for hypertension hyperlipidemia hypothyroidism who was seen by Dr. Maximino Greenland for symptoms of constipation.  As a part of her work-up she had her CBC checked which showed a low hemoglobin of 10.7 and hence she has been referred to [place].  Looking back at her prior CBCs patient has had a normal white count and a platelet count but hemoglobin has been between 10-11 since 2017 and has remained stable around that range.  There has been no improvement nor decline in her hemoglobin.  Ferritin levels were normal at 77 on 03/12/2021 with normal iron studies.  TSH in April 2022 was normal.   Interval history-patient has baseline fatigue but is otherwise doing well for her age.  No recent hospitalizations.  She did not feel significantly better after receiving IV iron in February 2024  ECOG PS- 1 Pain scale- 0   Review of systems- Review of Systems  Constitutional:  Positive for malaise/fatigue. Negative for chills, fever and weight loss.  HENT:  Negative for congestion, ear discharge and nosebleeds.   Eyes:  Negative for blurred vision.  Respiratory:  Negative for cough, hemoptysis, sputum  production, shortness of breath and wheezing.   Cardiovascular:  Negative for chest pain, palpitations, orthopnea and claudication.  Gastrointestinal:  Negative for abdominal pain, blood in stool, constipation, diarrhea, heartburn, melena, nausea and vomiting.  Genitourinary:  Negative for dysuria, flank pain, frequency, hematuria and urgency.  Musculoskeletal:  Negative for back pain, joint pain and myalgias.  Skin:  Negative for rash.  Neurological:  Negative for dizziness, tingling, focal weakness, seizures, weakness and headaches.  Endo/Heme/Allergies:  Does not bruise/bleed easily.  Psychiatric/Behavioral:  Negative for depression and suicidal ideas. The patient does not have insomnia.       Allergies  Allergen Reactions   Amlodipine Other (See Comments)    Ankle swelling   Atorvastatin     cramps   Evista [Raloxifene] Nausea Only     Past Medical History:  Diagnosis Date   Diabetes mellitus without complication (HCC)    Heart murmur    Hyperlipidemia    Hypertension    Hypothyroidism    Lichen sclerosus    Obesity    Osteopenia    Stroke [facility])    Thyroid disease      Past Surgical History:  Procedure Laterality Date   carpel tunnel     COLONOSCOPY WITH PROPOFOL N/A 08/30/2020   Procedure: COLONOSCOPY WITH PROPOFOL;  Surgeon: Pasty Spillers, MD;  Location: [facility] ENDOSCOPY;  Service: Gastroenterology;  Laterality: N/A;   ROTATOR CUFF REPAIR Left    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      Social History   Socioeconomic History   Marital status: Married    Spouse name: Not on file   Number of  children: Not on file   Years of education: Not on file   Highest education level: High school graduate  Occupational History   Occupation: retired  Tobacco Use   Smoking status: Former    Current packs/day: 0.00    Types: Cigarettes    Quit date: 01/1965    Years since quitting: 58.2   Smokeless tobacco: Never  Vaping Use   Vaping status: Never Used  Substance  and Sexual Activity   Alcohol use: No    Alcohol/week: 0.0 standard drinks of alcohol   Drug use: No   Sexual activity: Never  Other Topics Concern   Not on file  Social History Narrative   Not on file   Social Determinants of Health   Financial Resource Strain: Low Risk  (03/20/2022)   Overall Financial Resource Strain (CARDIA)    Difficulty of Paying Living Expenses: Not hard at all  Food Insecurity: No Food Insecurity (03/20/2022)   Hunger Vital Sign    Worried About Running Out of Food in the Last Year: Never true    Ran Out of Food in the Last Year: Never true  Transportation Needs: No Transportation Needs (03/20/2022)   PRAPARE - Administrator, Civil Service (Medical): No    Lack of Transportation (Non-Medical): No  Physical Activity: Inactive (03/20/2022)   Exercise Vital Sign    Days of Exercise per Week: 0 days    Minutes of Exercise per Session: 0 min  Stress: No Stress Concern Present (03/20/2022)   [employer] of Occupational Health - Occupational Stress Questionnaire    Feeling of Stress : Only a little  Social Connections: Socially Integrated (03/20/2022)   Social Connection and Isolation Panel [NHANES]    Frequency of Communication with Friends and Family: More than three times a week    Frequency of Social Gatherings with Friends and Family: Twice a week    Attends Religious Services: More than 4 times per year    Active Member of [employer] or Organizations: Yes    Attends Engineer, structural: More than 4 times per year    Marital Status: Married  Catering manager Violence: Not At Risk (03/20/2022)   Humiliation, Afraid, Rape, and Kick questionnaire    Fear of Current or Ex-Partner: No    Emotionally Abused: No    Physically Abused: No    Sexually Abused: No    Family History  Problem Relation Age of Onset   Hypertension Mother    Thyroid disease Mother    Glaucoma Mother    Coronary artery disease Mother    Stroke Father    Diabetes  Brother    Hypertension Maternal Grandfather    Breast cancer Neg Hx      Current Outpatient Medications:    ASPIRIN 81 PO, Take by mouth daily., Disp: , Rfl:    blood glucose meter kit and supplies, Dispense based on patient and insurance preference. Use up to four times daily as directed. (FOR ICD-9 250.00, 250.01)., Disp: 1 each, Rfl: 0   Blood Glucose Monitoring Suppl (ONETOUCH VERIO) w/Device KIT, 1 each by Does not apply route daily., Disp: 1 kit, Rfl: 0   chlorthalidone (HYGROTON) 25 MG tablet, TAKE 1 TABLET DAILY, Disp: 90 tablet, Rfl: 0   clobetasol ointment (TEMOVATE) 0.05 %, APPLY  OINTMENT TOPICALLY TWICE DAILY, Disp: 30 g, Rfl: 0   clopidogrel (PLAVIX) 75 MG tablet, TAKE 1 TABLET DAILY, Disp: 90 tablet, Rfl: 0   folic acid (FOLVITE) 800  MCG tablet, Take 400 mcg by mouth daily., Disp: , Rfl:    glucose blood (ONETOUCH VERIO) test strip, Use as instructed, Disp: 100 each, Rfl: 12   glucose blood test strip, 1 each by Other route 2 (two) times daily. Use as instructed, Dx: E11.22, Disp: 100 each, Rfl: 12   lisinopril (ZESTRIL) 30 MG tablet, TAKE 1 TABLET DAILY, Disp: 90 tablet, Rfl: 0   metFORMIN (GLUCOPHAGE-XR) 500 MG 24 hr tablet, TAKE 2 TABLETS TWICE A DAY, Disp: 360 tablet, Rfl: 0   nortriptyline (PAMELOR) 10 MG capsule, Take 1-2 capsules (10-20 mg total) by mouth at bedtime., Disp: 180 capsule, Rfl: 1   ondansetron (ZOFRAN) 4 MG tablet, Take 1 tablet (4 mg total) by mouth every 8 (eight) hours as needed for nausea or vomiting., Disp: 20 tablet, Rfl: 0   OneTouch Delica Lancets 33G MISC, 1 each by Does not apply route daily., Disp: 100 each, Rfl: 3   rosuvastatin (CRESTOR) 10 MG tablet, Take 1 tablet (10 mg total) by mouth daily., Disp: 90 tablet, Rfl: 1   SYNTHROID 50 MCG tablet, TAKE 1 TABLET DAILY, Disp: 90 tablet, Rfl: 0   valACYclovir (VALTREX) 1000 MG tablet, Take 1,000 mg by mouth 3 (three) times daily., Disp: , Rfl:    vitamin B-12 (CYANOCOBALAMIN) 1000 MCG tablet, Take  1,000 mcg by mouth daily., Disp: , Rfl:   Current Facility-Administered Medications:    cyanocobalamin (VITAMIN B12) injection 1,000 mcg, 1,000 mcg, Intramuscular, Q30 days, [place], [place], [place]  Physical exam:  Vitals:   03/10/23 0930 03/10/23 0938 03/10/23 0939  BP: (!) 151/54 (!) 150/55 (!) 140/60  Pulse: 62 (!) 59   Resp: 18    Temp: (!) 96.5 F (35.8 C)    TempSrc: Tympanic    Weight: 157 lb 9.6 oz (71.5 kg)    Height: 5\' 1"  (1.549 m)     Physical Exam Cardiovascular:     Rate and Rhythm: Normal rate and regular rhythm.     Heart sounds: Normal heart sounds.  Pulmonary:     Effort: Pulmonary effort is normal.  Skin:    General: Skin is warm and dry.  Neurological:     Mental Status: She is alert and oriented to person, place, and time.         Latest Ref Rng & Units 11/26/2022    9:58 AM  CMP  Glucose 70 - 99 mg/dL 72   BUN 8 - 27 mg/dL 27   Creatinine 1.61 - 1.00 mg/dL 0.96   Sodium 045 - 409 mmol/L 145   Potassium 3.5 - 5.2 mmol/L 4.8   Chloride 96 - 106 mmol/L 109   CO2 20 - 29 mmol/L 21   Calcium 8.7 - 10.3 mg/dL 9.3   Total Protein 6.0 - 8.5 g/dL 6.7   Total Bilirubin 0.0 - 1.2 mg/dL <8.1   Alkaline Phos 44 - 121 IU/L 89   AST 0 - 40 IU/L 17   ALT 0 - 32 IU/L 16       Latest Ref Rng & Units 03/10/2023    9:23 AM  CBC  WBC 4.0 - 10.5 K/uL 6.9   Hemoglobin 12.0 - 15.0 g/dL 19.1   Hematocrit 47.8 - 46.0 % 32.6   Platelets 150 - 400 K/uL 227    Anemia of chronic  Assessment and plan- Patient is a 77 y.o. female here for routine follow-up of anemia likely anemia of chronic disease  She patient's hemoglobin has been between 10.5-11.5  at least for the last 7 years without any discernible etiology.  It did drop down to 9.9 and January 2024 for which she received IV iron.  Presently it is at her baseline at 10.5.  Ferritin levels are normal at 160 with an iron saturation of 29% and therefore she does not require any IV iron at this time.  CBC ferritin and  iron studies in 4 and 8 months and I will see her back in 8 months   Visit Diagnosis 1. Other iron deficiency anemia   2. Anemia of chronic disease      Dr. Owens Shark, MD, MPH [facility] [phone] 03/10/2023 1:33 PM

## 2023-03-12 ENCOUNTER — Encounter: Payer: Self-pay | Admitting: Family Medicine

## 2023-03-13 ENCOUNTER — Ambulatory Visit: Payer: Self-pay | Admitting: *Deleted

## 2023-03-13 ENCOUNTER — Ambulatory Visit (INDEPENDENT_AMBULATORY_CARE_PROVIDER_SITE_OTHER): Payer: No Typology Code available for payment source | Admitting: Family Medicine

## 2023-03-13 ENCOUNTER — Encounter: Payer: Self-pay | Admitting: Family Medicine

## 2023-03-13 VITALS — BP 136/70 | HR 47 | Temp 97.7°F | Wt 158.6 lb

## 2023-03-13 DIAGNOSIS — H00012 Hordeolum externum right lower eyelid: Secondary | ICD-10-CM | POA: Diagnosis not present

## 2023-03-13 DIAGNOSIS — L659 Nonscarring hair loss, unspecified: Secondary | ICD-10-CM

## 2023-03-13 MED ORDER — ERYTHROMYCIN 5 MG/GM OP OINT: 1.0000 | TOPICAL_OINTMENT | Freq: Three times a day (TID) | OPHTHALMIC | 0 refills | Status: DC

## 2023-03-13 NOTE — Telephone Encounter (Signed)
Has an appointment today @ 3:40 pm with provider.

## 2023-03-13 NOTE — Telephone Encounter (Signed)
Summary: Redness in Eyes Advice   Pt is calling to report that she has red bump in her right eye and both eyes are stuck together. No available appts until Monday March 17, 2023. Please advise       Chief Complaint: right eye redness, with crusty drainage in both eyes  Symptoms: right eye with red bump x 1 week 1/2. Will not go away. Wakes up every am with crusty drainage from both eyes . Sneezing reported. No pain  Frequency: 1 week 1/2 Pertinent Negatives: Patient denies fever no pain no significant swelling of eye , no loss of vision no blurred vision Disposition: [] ED /[] Urgent Care (no appt availability in office) / [x] Appointment(In office/virtual)/ []  [facility]/ [] Home Care/ [] Refused Recommended Disposition /[] [place] [facility]/ []  Follow-up with PCP Additional Notes:   Appt scheduled today by [facility].     Reason for Disposition  [1] Only 1 eye is red AND [2] present > 48 hours  Answer Assessment - Initial Assessment Questions 1. LOCATION: Location: "What's red, the eyeball or the outer eyelids?" (Note: when callers say the eye is red, they usually mean the sclera is red)       Redness out side of eyey 2. REDNESS OF SCLERA: "Is the redness in one or both eyes?" "When did the redness start?"      Redness right  eyes both eyes with dried up crusty drainage  3. ONSET: "When did the eye become red?" (e.g., hours, days)      1 week and 1/2  4. EYELIDS: "Are the eyelids red or swollen?" If Yes, ask: "How much?"      Right eye 5. VISION: "Is there any difficulty seeing clearly?"      na 6. ITCHING: "Does it feel itchy?" If so ask: "How bad is it" (e.g., Scale 1-10; or mild, moderate, severe)     Minimal  7. PAIN: "Is there any pain? If Yes, ask: "How bad is it?" (e.g., Scale 1-10; or mild, moderate, severe)     No pain  8. CONTACT LENS: "Do you wear contacts?"     no 9. CAUSE: "What do you think is causing the redness?"     Not sure  10. OTHER SYMPTOMS:  "Do you have any other symptoms?" (e.g., fever, runny nose, cough, vomiting)       Sneezing  Protocols used: Eye - Red Without Pus-A-AH

## 2023-03-13 NOTE — Progress Notes (Signed)
BP 136/70   Pulse (!) 47   Temp 97.7 F (36.5 C) (Oral)   Wt 158 lb 9.6 oz (71.9 kg)   LMP  (LMP Unknown)   SpO2 98%   BMI 29.97 kg/m    Subjective:    Patient ID: Teresa [name], female    DOB: 1946/02/12, 77 y.o.   MRN: [id number]  HPI: Teresa [name] is a 77 y.o. female  Chief Complaint  Patient presents with   Eye Drainage    Has a bump on eye for a week that hasn't gone away, Eye Drainage every morning after waking up   EYE BUMP Duration:  7-10 days Involved eye:  right Onset: sudden Severity: mild  Quality: itchy and sore Foreign body sensation:no Visual impairment: no Eye redness: yes Discharge: yes Crusting or matting of eyelids: no Swelling: no Photophobia: no Itching: no Tearing: no Headache: no Floaters: no URI symptoms: no Contact lens use: no Close contacts with similar problems: no Eye trauma: no Status: stable   Relevant past medical, surgical, family and social history reviewed and updated as indicated. Interim medical history since our last visit reviewed. Allergies and medications reviewed and updated.  Review of Systems  Constitutional: Negative.   Eyes:  Positive for redness. Negative for photophobia, pain, discharge, itching and visual disturbance.  Respiratory: Negative.    Cardiovascular: Negative.   Endocrine:       Hair loss  Musculoskeletal: Negative.   Skin: Negative.   Psychiatric/Behavioral: Negative.      Per HPI unless specifically indicated above     Objective:    BP 136/70   Pulse (!) 47   Temp 97.7 F (36.5 C) (Oral)   Wt 158 lb 9.6 oz (71.9 kg)   LMP  (LMP Unknown)   SpO2 98%   BMI 29.97 kg/m   Wt Readings from Last 3 Encounters:  03/13/23 158 lb 9.6 oz (71.9 kg)  03/10/23 157 lb 9.6 oz (71.5 kg)  01/08/23 156 lb 9.6 oz (71 kg)    Physical Exam Vitals and nursing note reviewed.  Constitutional:      General: She is not in acute distress.    Appearance: Normal appearance. She is not  ill-appearing, toxic-appearing or diaphoretic.  HENT:     Head: Normocephalic and atraumatic.     Right Ear: External ear normal.     Left Ear: External ear normal.     Nose: Nose normal.     Mouth/Throat:     Mouth: Mucous membranes are moist.     Pharynx: Oropharynx is clear.  Eyes:     General: No scleral icterus.       Right eye: Hordeolum present. No discharge.        Left eye: No discharge.     Extraocular Movements: Extraocular movements intact.     Conjunctiva/sclera: Conjunctivae normal.     Pupils: Pupils are equal, round, and reactive to light.  Cardiovascular:     Rate and Rhythm: Normal rate and regular rhythm.     Pulses: Normal pulses.     Heart sounds: Normal heart sounds. No murmur heard.    No friction rub. No gallop.  Pulmonary:     Effort: Pulmonary effort is normal. No respiratory distress.     Breath sounds: Normal breath sounds. No stridor. No wheezing, rhonchi or rales.  Chest:     Chest wall: No tenderness.  Musculoskeletal:        General: Normal range of motion.  Cervical back: Normal range of motion and neck supple.  Skin:    General: Skin is warm and dry.     Capillary Refill: Capillary refill takes less than 2 seconds.     Coloration: Skin is not jaundiced or pale.     Findings: No bruising, erythema, lesion or rash.  Neurological:     General: No focal deficit present.     Mental Status: She is alert and oriented to person, place, and time. Mental status is at baseline.  Psychiatric:        Mood and Affect: Mood normal.        Behavior: Behavior normal.        Thought Content: Thought content normal.        Judgment: Judgment normal.     Results for orders placed or performed in visit on 03/10/23  Iron and TIBC([facility])  Result Value Ref Range   Iron 83 28 - 170 ug/dL   TIBC 875 643 - 329 ug/dL   Saturation Ratios 29 10.4 - 31.8 %   UIBC 207 ug/dL  Ferritin  Result Value Ref Range   Ferritin 160 11 - 307 ng/mL  CBC with  Differential/Platelet  Result Value Ref Range   WBC 6.9 4.0 - 10.5 K/uL   RBC 3.38 (L) 3.87 - 5.11 MIL/uL   Hemoglobin 10.5 (L) 12.0 - 15.0 g/dL   HCT 51.8 (L) 84.1 - 66.0 %   MCV 96.4 80.0 - 100.0 fL   MCH 31.1 26.0 - 34.0 pg   MCHC 32.2 30.0 - 36.0 g/dL   RDW 63.0 16.0 - 10.9 %   Platelets 227 150 - 400 K/uL   nRBC 0.0 0.0 - 0.2 %   Neutrophils Relative % 62 %   Neutro Abs 4.3 1.7 - 7.7 K/uL   Lymphocytes Relative 29 %   Lymphs Abs 2.0 0.7 - 4.0 K/uL   Monocytes Relative 8 %   Monocytes Absolute 0.5 0.1 - 1.0 K/uL   Eosinophils Relative 1 %   Eosinophils Absolute 0.1 0.0 - 0.5 K/uL   Basophils Relative 0 %   Basophils Absolute 0.0 0.0 - 0.1 K/uL   Immature Granulocytes 0 %   Abs Immature Granulocytes 0.02 0.00 - 0.07 K/uL      Assessment & Plan:   Problem List Items Addressed This Visit   None Visit Diagnoses     Hordeolum externum of right lower eyelid    -  Primary   Will treat with erythromycin and warm compresses. Call if not getting better or getting worse.   Hair loss       Referral to dermatology placed today.   Relevant Orders   Ambulatory referral to Dermatology        Follow up plan: No follow-ups on file.

## 2023-03-17 ENCOUNTER — Telehealth: Payer: Self-pay | Admitting: Family Medicine

## 2023-03-17 NOTE — Telephone Encounter (Signed)
As long as she's getting better, OK to keep going with current treatment. Thanks!

## 2023-03-17 NOTE — Telephone Encounter (Unsigned)
Copied from CRM [id number]. Topic: General - Inquiry >> Mar 17, 2023 11:53 AM Marlow Baars wrote: Reason for CRM: The patient called in at the request of her provider. She states although her eye looks better the sty has not totally gone away yet and the provider wanted to know this. Please assist patient further

## 2023-03-17 NOTE — Telephone Encounter (Signed)
Called and spoke with pt informed her to continue treatment. Pt voiced understanding

## 2023-03-24 ENCOUNTER — Encounter: Payer: Self-pay | Admitting: Family Medicine

## 2023-03-24 ENCOUNTER — Ambulatory Visit (INDEPENDENT_AMBULATORY_CARE_PROVIDER_SITE_OTHER): Payer: No Typology Code available for payment source | Admitting: Family Medicine

## 2023-03-24 VITALS — BP 122/62 | HR 51 | Temp 98.0°F | Wt 157.6 lb

## 2023-03-24 DIAGNOSIS — Z7984 Long term (current) use of oral hypoglycemic drugs: Secondary | ICD-10-CM | POA: Diagnosis not present

## 2023-03-24 DIAGNOSIS — N1831 Chronic kidney disease, stage 3a: Secondary | ICD-10-CM | POA: Diagnosis not present

## 2023-03-24 DIAGNOSIS — Z0000 Encounter for general adult medical examination without abnormal findings: Secondary | ICD-10-CM | POA: Diagnosis not present

## 2023-03-24 DIAGNOSIS — E1122 Type 2 diabetes mellitus with diabetic chronic kidney disease: Secondary | ICD-10-CM

## 2023-03-24 LAB — BAYER DCA HB A1C WAIVED: HB A1C (BAYER DCA - WAIVED): 6.6 % — ABNORMAL HIGH (ref 4.8–5.6)

## 2023-03-24 NOTE — Assessment & Plan Note (Signed)
Doing great with A1c of 6.6. Continue current regimen. Call with any concerns. Recheck 3 months.

## 2023-03-24 NOTE — Progress Notes (Signed)
BP 122/62   Pulse (!) 51   Temp 98 F (36.7 C) (Oral)   Wt 157 lb 9.6 oz (71.5 kg)   LMP  (LMP Unknown)   SpO2 99%   BMI 29.78 kg/m    Subjective:    Patient ID: Teresa [name], female    DOB: Apr 04, 1946, 77 y.o.   MRN: [id number]  HPI: Teresa [name] is a 77 y.o. female presenting on 03/24/2023 for comprehensive medical examination. Current medical complaints include:  DIABETES Hypoglycemic episodes:no Polydipsia/polyuria: no Visual disturbance: no Chest pain: no Paresthesias: no Glucose Monitoring: no  Accucheck frequency: Not Checking Taking Insulin?: no Blood Pressure Monitoring: not checking Retinal Examination: Up to Date Foot Exam: Up to Date Diabetic Education: Completed Pneumovax: Up to Date Influenza: Not up to Date Aspirin: no  Menopausal Symptoms: no  Functional Status Survey: Is the patient deaf or have difficulty hearing?: Yes Does the patient have difficulty seeing, even when wearing glasses/contacts?: No Does the patient have difficulty concentrating, remembering, or making decisions?: Yes Does the patient have difficulty walking or climbing stairs?: Yes Does the patient have difficulty dressing or bathing?: No Does the patient have difficulty doing errands alone such as visiting a doctor's office or shopping?: No     03/24/2023    1:28 PM 05/30/2022   10:25 AM 03/22/2022   11:22 AM 03/20/2022    9:43 AM 03/01/2022    2:02 PM  Fall Risk   Falls in the past year? 0 0 0 0 0  Number falls in past yr: 0 0 0 0 0  Injury with Fall? 0 0 0 0 0  Risk for fall due to :  No Fall Risks No Fall Risks    Follow up Falls evaluation completed Falls evaluation completed Falls evaluation completed Falls evaluation completed;Education provided;Falls prevention discussed Falls evaluation completed    Depression Screen    03/24/2023    1:28 PM 03/22/2022   11:22 AM 03/20/2022    9:48 AM 03/01/2022    2:02 PM 12/18/2021    9:31 AM  Depression screen PHQ 2/9   Decreased Interest 0 0 0 0 2  Down, Depressed, Hopeless 0 0 0 0 0  PHQ - 2 Score 0 0 0 0 2  Altered sleeping  1 0 0 1  Tired, decreased energy  1 2 2 1   Change in appetite  0 1 0 2  Feeling bad or failure about yourself   0 0 0 0  Trouble concentrating  0 0 0 2  Moving slowly or fidgety/restless  0 0 0 0  Suicidal thoughts  0 0 0 0  PHQ-9 Score  2 3 2 8   Difficult doing work/chores  Not difficult at all Not difficult at all Not difficult at all Not difficult at all    Advanced Directives Does patient have a HCPOA?    no If yes, name and contact information:  Does patient have a living will or MOST form?  no  Past Medical History:  Past Medical History:  Diagnosis Date   Diabetes mellitus without complication (HCC)    Heart murmur    Hyperlipidemia    Hypertension    Hypothyroidism    Lichen sclerosus    Obesity    Osteopenia    Stroke [facility])    Thyroid disease     Surgical History:  Past Surgical History:  Procedure Laterality Date   carpel tunnel     COLONOSCOPY WITH PROPOFOL N/A 08/30/2020  Procedure: COLONOSCOPY WITH PROPOFOL;  Surgeon: Pasty Spillers, MD;  Location: [facility] ENDOSCOPY;  Service: Gastroenterology;  Laterality: N/A;   ROTATOR CUFF REPAIR Left    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      Medications:  Current Outpatient Medications on File Prior to Visit  Medication Sig   ASPIRIN 81 PO Take by mouth daily.   blood glucose meter kit and supplies Dispense based on patient and insurance preference. Use up to four times daily as directed. (FOR ICD-9 250.00, 250.01).   Blood Glucose Monitoring Suppl (ONETOUCH VERIO) w/Device KIT 1 each by Does not apply route daily.   chlorthalidone (HYGROTON) 25 MG tablet TAKE 1 TABLET DAILY   clobetasol ointment (TEMOVATE) 0.05 % APPLY  OINTMENT TOPICALLY TWICE DAILY   clopidogrel (PLAVIX) 75 MG tablet TAKE 1 TABLET DAILY   erythromycin ophthalmic ointment Place 1 Application into the left eye 3 (three) times daily.    folic acid (FOLVITE) 800 MCG tablet Take 400 mcg by mouth daily.   glucose blood (ONETOUCH VERIO) test strip Use as instructed   glucose blood test strip 1 each by Other route 2 (two) times daily. Use as instructed, Dx: E11.22   lisinopril (ZESTRIL) 30 MG tablet TAKE 1 TABLET DAILY   metFORMIN (GLUCOPHAGE-XR) 500 MG 24 hr tablet TAKE 2 TABLETS TWICE A DAY   nortriptyline (PAMELOR) 10 MG capsule Take 1-2 capsules (10-20 mg total) by mouth at bedtime.   ondansetron (ZOFRAN) 4 MG tablet Take 1 tablet (4 mg total) by mouth every 8 (eight) hours as needed for nausea or vomiting.   OneTouch Delica Lancets 33G MISC 1 each by Does not apply route daily.   rosuvastatin (CRESTOR) 10 MG tablet Take 1 tablet (10 mg total) by mouth daily.   SYNTHROID 50 MCG tablet TAKE 1 TABLET DAILY   valACYclovir (VALTREX) 1000 MG tablet Take 1,000 mg by mouth 3 (three) times daily.   vitamin B-12 (CYANOCOBALAMIN) 1000 MCG tablet Take 1,000 mcg by mouth daily.   Current Facility-Administered Medications on File Prior to Visit  Medication   cyanocobalamin (VITAMIN B12) injection 1,000 mcg    Allergies:  Allergies  Allergen Reactions   Amlodipine Other (See Comments)    Ankle swelling   Atorvastatin     cramps   Evista [Raloxifene] Nausea Only    Social History:  Social History   Socioeconomic History   Marital status: Married    Spouse name: Not on file   Number of children: Not on file   Years of education: Not on file   Highest education level: High school graduate  Occupational History   Occupation: retired  Tobacco Use   Smoking status: Former    Current packs/day: 0.00    Types: Cigarettes    Quit date: 01/1965    Years since quitting: 58.2   Smokeless tobacco: Never  Vaping Use   Vaping status: Never Used  Substance and Sexual Activity   Alcohol use: No    Alcohol/week: 0.0 standard drinks of alcohol   Drug use: No   Sexual activity: Never  Other Topics Concern   Not on file  Social  History Narrative   Not on file   Social Determinants of Health   Financial Resource Strain: Low Risk  (03/20/2022)   Overall Financial Resource Strain (CARDIA)    Difficulty of Paying Living Expenses: Not hard at all  Food Insecurity: No Food Insecurity (03/20/2022)   Hunger Vital Sign    Worried About Running Out of  Food in the Last Year: Never true    Ran Out of Food in the Last Year: Never true  Transportation Needs: No Transportation Needs (03/20/2022)   PRAPARE - Administrator, Civil Service (Medical): No    Lack of Transportation (Non-Medical): No  Physical Activity: Inactive (03/20/2022)   Exercise Vital Sign    Days of Exercise per Week: 0 days    Minutes of Exercise per Session: 0 min  Stress: No Stress Concern Present (03/20/2022)   [employer] of Occupational Health - Occupational Stress Questionnaire    Feeling of Stress : Only a little  Social Connections: Socially Integrated (03/20/2022)   Social Connection and Isolation Panel [NHANES]    Frequency of Communication with Friends and Family: More than three times a week    Frequency of Social Gatherings with Friends and Family: Twice a week    Attends Religious Services: More than 4 times per year    Active Member of [employer] or Organizations: Yes    Attends Engineer, structural: More than 4 times per year    Marital Status: Married  Catering manager Violence: Not At Risk (03/20/2022)   Humiliation, Afraid, Rape, and Kick questionnaire    Fear of Current or Ex-Partner: No    Emotionally Abused: No    Physically Abused: No    Sexually Abused: No   Social History   Tobacco Use  Smoking Status Former   Current packs/day: 0.00   Types: Cigarettes   Quit date: 01/1965   Years since quitting: 58.2  Smokeless Tobacco Never   Social History   Substance and Sexual Activity  Alcohol Use No   Alcohol/week: 0.0 standard drinks of alcohol    Family History:  Family History  Problem Relation Age of  Onset   Hypertension Mother    Thyroid disease Mother    Glaucoma Mother    Coronary artery disease Mother    Stroke Father    Diabetes Brother    Hypertension Maternal Grandfather    Breast cancer Neg Hx     Past medical history, surgical history, medications, allergies, family history and social history reviewed with patient today and changes made to appropriate areas of the chart.   Review of Systems  Constitutional: Negative.   HENT: Negative.    Eyes: Negative.   Respiratory: Negative.    Cardiovascular: Negative.   Gastrointestinal: Negative.   Genitourinary: Negative.   Musculoskeletal: Negative.   Skin: Negative.   Neurological: Negative.   Endo/Heme/Allergies: Negative.   Psychiatric/Behavioral: Negative.      All other ROS negative except what is listed above and in the HPI.      Objective:    BP 122/62   Pulse (!) 51   Temp 98 F (36.7 C) (Oral)   Wt 157 lb 9.6 oz (71.5 kg)   LMP  (LMP Unknown)   SpO2 99%   BMI 29.78 kg/m   Wt Readings from Last 3 Encounters:  03/24/23 157 lb 9.6 oz (71.5 kg)  03/13/23 158 lb 9.6 oz (71.9 kg)  03/10/23 157 lb 9.6 oz (71.5 kg)    No results found.  Physical Exam Vitals and nursing note reviewed.  Constitutional:      General: She is not in acute distress.    Appearance: Normal appearance. She is not ill-appearing, toxic-appearing or diaphoretic.  HENT:     Head: Normocephalic and atraumatic.     Right Ear: External ear normal.     Left Ear:  External ear normal.     Nose: Nose normal.     Mouth/Throat:     Mouth: Mucous membranes are moist.     Pharynx: Oropharynx is clear.  Eyes:     General: No scleral icterus.       Right eye: No discharge.        Left eye: No discharge.     Extraocular Movements: Extraocular movements intact.     Conjunctiva/sclera: Conjunctivae normal.     Pupils: Pupils are equal, round, and reactive to light.  Cardiovascular:     Rate and Rhythm: Normal rate and regular rhythm.      Pulses: Normal pulses.     Heart sounds: Normal heart sounds. No murmur heard.    No friction rub. No gallop.  Pulmonary:     Effort: Pulmonary effort is normal. No respiratory distress.     Breath sounds: Normal breath sounds. No stridor. No wheezing, rhonchi or rales.  Chest:     Chest wall: No tenderness.  Musculoskeletal:        General: Normal range of motion.     Cervical back: Normal range of motion and neck supple.  Skin:    General: Skin is warm and dry.     Capillary Refill: Capillary refill takes less than 2 seconds.     Coloration: Skin is not jaundiced or pale.     Findings: No bruising, erythema, lesion or rash.  Neurological:     General: No focal deficit present.     Mental Status: She is alert and oriented to person, place, and time. Mental status is at baseline.  Psychiatric:        Mood and Affect: Mood normal.        Behavior: Behavior normal.        Thought Content: Thought content normal.        Judgment: Judgment normal.        03/24/2023    1:31 PM 03/20/2022    9:45 AM 03/19/2021    9:52 AM 03/10/2020    1:12 PM 02/17/2019    9:56 AM  6CIT Screen  What Year? 0 points 0 points 0 points 0 points 0 points  What month? 0 points 0 points 0 points 0 points 0 points  What time? 0 points 0 points 3 points 0 points 0 points  Count back from 20 0 points 0 points 0 points 0 points 0 points  Months in reverse 2 points 2 points 0 points 0 points 0 points  Repeat phrase 2 points 2 points 10 points 4 points 0 points  Total Score 4 points 4 points 13 points 4 points 0 points    Results for orders placed or performed in visit on 03/10/23  Iron and TIBC([facility])  Result Value Ref Range   Iron 83 28 - 170 ug/dL   TIBC 161 096 - 045 ug/dL   Saturation Ratios 29 10.4 - 31.8 %   UIBC 207 ug/dL  Ferritin  Result Value Ref Range   Ferritin 160 11 - 307 ng/mL  CBC with Differential/Platelet  Result Value Ref Range   WBC 6.9 4.0 - 10.5 K/uL   RBC 3.38 (L)  3.87 - 5.11 MIL/uL   Hemoglobin 10.5 (L) 12.0 - 15.0 g/dL   HCT 40.9 (L) 81.1 - 91.4 %   MCV 96.4 80.0 - 100.0 fL   MCH 31.1 26.0 - 34.0 pg   MCHC 32.2 30.0 - 36.0 g/dL   RDW 78.2 95.6 -  15.5 %   Platelets 227 150 - 400 K/uL   nRBC 0.0 0.0 - 0.2 %   Neutrophils Relative % 62 %   Neutro Abs 4.3 1.7 - 7.7 K/uL   Lymphocytes Relative 29 %   Lymphs Abs 2.0 0.7 - 4.0 K/uL   Monocytes Relative 8 %   Monocytes Absolute 0.5 0.1 - 1.0 K/uL   Eosinophils Relative 1 %   Eosinophils Absolute 0.1 0.0 - 0.5 K/uL   Basophils Relative 0 %   Basophils Absolute 0.0 0.0 - 0.1 K/uL   Immature Granulocytes 0 %   Abs Immature Granulocytes 0.02 0.00 - 0.07 K/uL      Assessment & Plan:   Problem List Items Addressed This Visit       Endocrine   Type 2 diabetes mellitus with stage 3 chronic kidney disease, without long-term current use of insulin (HCC)    Doing great with A1c of 6.6. Continue current regimen. Call with any concerns. Recheck 3 months.       Relevant Orders   Bayer DCA Hb A1c Waived   Other Visit Diagnoses     Encounter for Medicare annual wellness exam    -  Primary   Preventative care discussed today as below.        Preventative Services:  Health Risk Assessment and Personalized Prevention Plan: Done today Bone Mass Measurements: up to date Breast Cancer Screening: N/A CVD Screening: up to date Cervical Cancer Screening: N/A Colon Cancer Screening: N/A Depression Screening: Done today Diabetes Screening: Done today Glaucoma Screening: See your eye doctor Hepatitis B vaccine: N/A Hepatitis C screening: Up to date HIV Screening: up to date Flu Vaccine: get next time Lung cancer Screening: N/A Obesity Screening: Done today Pneumonia Vaccines (2): up to date STI Screening: N/A  Follow up plan: Return in about 3 months (around 06/24/2023) for physical.   LABORATORY TESTING:  - Pap smear: not applicable  IMMUNIZATIONS:   - Tdap: Tetanus vaccination status  reviewed: last tetanus booster within 10 years. - Influenza: Postponed to flu season - Pneumovax: Up to date - Prevnar: Up to date - Zostavax vaccine: Not applicable  SCREENING: -Mammogram: Not applicable  - Colonoscopy: Not applicable  - Bone Density: Up to date   PATIENT COUNSELING:   Advised to take 1 mg of folate supplement per day if capable of pregnancy.   Sexuality: Discussed sexually transmitted diseases, partner selection, use of condoms, avoidance of unintended pregnancy  and contraceptive alternatives.   Advised to avoid cigarette smoking.  I discussed with the patient that most people either abstain from alcohol or drink within safe limits (<=14/week and <=4 drinks/occasion for males, <=7/weeks and <= 3 drinks/occasion for females) and that the risk for alcohol disorders and other health effects rises proportionally with the number of drinks per week and how often a drinker exceeds daily limits.  Discussed cessation/primary prevention of drug use and availability of treatment for abuse.   Diet: Encouraged to adjust caloric intake to maintain  or achieve ideal body weight, to reduce intake of dietary saturated fat and total fat, to limit sodium intake by avoiding high sodium foods and not adding table salt, and to maintain adequate dietary potassium and calcium preferably from fresh fruits, vegetables, and low-fat dairy products.    stressed the importance of regular exercise  Injury prevention: Discussed safety belts, safety helmets, smoke detector, smoking near bedding or upholstery.   Dental health: Discussed importance of regular tooth brushing, flossing, and dental visits.  NEXT PREVENTATIVE PHYSICAL DUE IN 1 YEAR. Return in about 3 months (around 06/24/2023) for physical.

## 2023-03-24 NOTE — Patient Instructions (Signed)
Preventative Services:  Health Risk Assessment and Personalized Prevention Plan: Done today Bone Mass Measurements: up to date Breast Cancer Screening: N/A CVD Screening: up to date Cervical Cancer Screening: N/A Colon Cancer Screening: N/A Depression Screening: Done today Diabetes Screening: Done today Glaucoma Screening: See your eye doctor Hepatitis B vaccine: N/A Hepatitis C screening: Up to date HIV Screening: up to date Flu Vaccine: get next time Lung cancer Screening: N/A Obesity Screening: Done today Pneumonia Vaccines (2): up to date STI Screening: N/A

## 2023-04-11 ENCOUNTER — Ambulatory Visit: Payer: No Typology Code available for payment source | Admitting: Family Medicine

## 2023-04-22 ENCOUNTER — Ambulatory Visit: Payer: No Typology Code available for payment source | Admitting: Family Medicine

## 2023-04-22 ENCOUNTER — Ambulatory Visit (INDEPENDENT_AMBULATORY_CARE_PROVIDER_SITE_OTHER): Payer: No Typology Code available for payment source | Admitting: Emergency Medicine

## 2023-04-22 VITALS — BP 118/60 | HR 58 | Ht 61.0 in | Wt 158.4 lb

## 2023-04-22 DIAGNOSIS — Z0000 Encounter for general adult medical examination without abnormal findings: Secondary | ICD-10-CM

## 2023-04-22 DIAGNOSIS — Z1231 Encounter for screening mammogram for malignant neoplasm of breast: Secondary | ICD-10-CM

## 2023-04-22 NOTE — Patient Instructions (Addendum)
Teresa [name] , Thank you for taking time to come for your Medicare Wellness Visit. I appreciate your ongoing commitment to your health goals. Please review the following plan we discussed and let me know if I can assist you in the future.   Referrals/Orders/Follow-Ups/Clinician Recommendations: I have placed an order for your mammogram. It is due after 08/23/23. Call [facility] [place] @ [phone] to schedule. Get your flu shot in the fall. Get a diabetic foot exam at your next OV with Dr. Laural Benes on 06/20/23.  This is a list of the screening recommended for you and due dates:  Health Maintenance  Topic Date Due   Zoster (Shingles) Vaccine (1 of 2) 07/08/1996   COVID-19 Vaccine (4 - 2023-24 season) 04/13/2023   Flu Shot  11/10/2023*   Yearly kidney health urinalysis for diabetes  05/31/2023   Complete foot exam   05/31/2023   Mammogram  08/23/2023   Eye exam for diabetics  08/27/2023   Hemoglobin A1C  09/24/2023   Yearly kidney function blood test for diabetes  11/26/2023   Medicare Annual Wellness Visit  04/21/2024   Colon Cancer Screening  08/30/2025   DEXA scan (bone density measurement)  08/23/2027   DTaP/Tdap/Td vaccine (6 - Td or Tdap) 11/29/2030   Pneumonia Vaccine  Completed   Hepatitis C Screening  Completed   HPV Vaccine  Aged Out  *Topic was postponed. The date shown is not the original due date.    Advanced directives: (ACP Link)Information on Advanced Care Planning can be found at [facility] Directives Advance Health Care Directives ([email or web address]   Next Medicare Annual Wellness Visit scheduled for next year: Yes, 04/27/24 @ 12:45pm

## 2023-04-22 NOTE — Progress Notes (Signed)
Subjective:   Teresa [name] is a 77 y.o. female who presents for Medicare Annual (Subsequent) preventive examination.  Visit Complete: In person  Review of Systems     Cardiac Risk Factors include: advanced age (>64men, >23 women);diabetes mellitus;dyslipidemia;hypertension     Objective:    Today's Vitals   04/22/23 1250  BP: (!) 144/60  Pulse: (!) 58  SpO2: 99%  Weight: 158 lb 6.4 oz (71.8 kg)  Height: 5\' 1"  (1.549 m)   Body mass index is 29.93 kg/m.     04/22/2023    1:02 PM 03/10/2023    9:34 AM 03/20/2022    9:43 AM 03/08/2022    8:41 AM 09/03/2021    1:58 PM 05/02/2021    1:32 PM 04/17/2021   10:53 AM  Advanced Directives  Does Patient Have a Medical Advance Directive? No Yes No No No No No  Would patient like information on creating a medical advance directive? Yes (MAU/Ambulatory/Procedural Areas - Information given)  No - Patient declined No - Patient declined No - Patient declined No - Patient declined No - Patient declined    Current Medications (verified) Outpatient Encounter Medications as of 04/22/2023  Medication Sig   ASPIRIN 81 PO Take by mouth daily.   blood glucose meter kit and supplies Dispense based on patient and insurance preference. Use up to four times daily as directed. (FOR ICD-9 250.00, 250.01).   Blood Glucose Monitoring Suppl (ONETOUCH VERIO) w/Device KIT 1 each by Does not apply route daily.   chlorthalidone (HYGROTON) 25 MG tablet TAKE 1 TABLET DAILY   clobetasol ointment (TEMOVATE) 0.05 % APPLY  OINTMENT TOPICALLY TWICE DAILY   clopidogrel (PLAVIX) 75 MG tablet TAKE 1 TABLET DAILY   folic acid (FOLVITE) 800 MCG tablet Take 400 mcg by mouth daily.   glucose blood (ONETOUCH VERIO) test strip Use as instructed   glucose blood test strip 1 each by Other route 2 (two) times daily. Use as instructed, Dx: E11.22   lisinopril (ZESTRIL) 30 MG tablet TAKE 1 TABLET DAILY   metFORMIN (GLUCOPHAGE-XR) 500 MG 24 hr tablet TAKE 2 TABLETS TWICE A DAY    OneTouch Delica Lancets 33G MISC 1 each by Does not apply route daily.   rosuvastatin (CRESTOR) 10 MG tablet Take 1 tablet (10 mg total) by mouth daily.   SYNTHROID 50 MCG tablet TAKE 1 TABLET DAILY   vitamin B-12 (CYANOCOBALAMIN) 1000 MCG tablet Take 1,000 mcg by mouth daily.   erythromycin ophthalmic ointment Place 1 Application into the left eye 3 (three) times daily. (Patient not taking: Reported on 04/22/2023)   nortriptyline (PAMELOR) 10 MG capsule Take 1-2 capsules (10-20 mg total) by mouth at bedtime. (Patient not taking: Reported on 04/22/2023)   ondansetron (ZOFRAN) 4 MG tablet Take 1 tablet (4 mg total) by mouth every 8 (eight) hours as needed for nausea or vomiting. (Patient not taking: Reported on 04/22/2023)   valACYclovir (VALTREX) 1000 MG tablet Take 1,000 mg by mouth 3 (three) times daily. (Patient not taking: Reported on 04/22/2023)   Facility-Administered Encounter Medications as of 04/22/2023  Medication   cyanocobalamin (VITAMIN B12) injection 1,000 mcg    Allergies (verified) Amlodipine, Atorvastatin, and Evista [raloxifene]   History: Past Medical History:  Diagnosis Date   Diabetes mellitus without complication (HCC)    Heart murmur    Hyperlipidemia    Hypertension    Hypothyroidism    Lichen sclerosus    Obesity    Osteopenia    Stroke (HCC)  Thyroid disease    Past Surgical History:  Procedure Laterality Date   carpel tunnel     COLONOSCOPY WITH PROPOFOL N/A 08/30/2020   Procedure: COLONOSCOPY WITH PROPOFOL;  Surgeon: Pasty Spillers, MD;  Location: [facility] ENDOSCOPY;  Service: Gastroenterology;  Laterality: N/A;   ROTATOR CUFF REPAIR Left    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     Family History  Problem Relation Age of Onset   Hypertension Mother    Thyroid disease Mother    Glaucoma Mother    Coronary artery disease Mother    Stroke Father    Diabetes Brother    Hypertension Maternal Grandfather    Breast cancer Neg Hx    Social History    Socioeconomic History   Marital status: Married    Spouse name: Molly Maduro   Number of children: 1   Years of education: Not on file   Highest education level: High school graduate  Occupational History   Occupation: retired  Tobacco Use   Smoking status: Former    Current packs/day: 0.00    Average packs/day: 0.3 packs/day for 2.4 years (0.6 ttl pk-yrs)    Types: Cigarettes    Start date: 1964    Quit date: 01/1965    Years since quitting: 58.3   Smokeless tobacco: Never  Vaping Use   Vaping status: Never Used  Substance and Sexual Activity   Alcohol use: No    Alcohol/week: 0.0 standard drinks of alcohol   Drug use: No   Sexual activity: Never  Other Topics Concern   Not on file  Social History Narrative   Not on file   Social Determinants of Health   Financial Resource Strain: Low Risk  (04/22/2023)   Overall Financial Resource Strain (CARDIA)    Difficulty of Paying Living Expenses: Not hard at all  Food Insecurity: No Food Insecurity (04/22/2023)   Hunger Vital Sign    Worried About Running Out of Food in the Last Year: Never true    Ran Out of Food in the Last Year: Never true  Transportation Needs: No Transportation Needs (04/22/2023)   PRAPARE - Administrator, Civil Service (Medical): No    Lack of Transportation (Non-Medical): No  Physical Activity: Inactive (04/22/2023)   Exercise Vital Sign    Days of Exercise per Week: 0 days    Minutes of Exercise per Session: 0 min  Stress: No Stress Concern Present (04/22/2023)   [employer] of Occupational Health - Occupational Stress Questionnaire    Feeling of Stress : Not at all  Social Connections: Moderately Integrated (04/22/2023)   Social Connection and Isolation Panel [NHANES]    Frequency of Communication with Friends and Family: More than three times a week    Frequency of Social Gatherings with Friends and Family: Three times a week    Attends Religious Services: More than 4 times per year     Active Member of Clubs or Organizations: No    Attends Banker Meetings: Never    Marital Status: Married    Tobacco Counseling Counseling given: Not Answered   Clinical Intake:  Pre-visit preparation completed: Yes  Pain : No/denies pain     BMI - recorded: 29.93 Nutritional Status: BMI 25 -29 Overweight Nutritional Risks: None Diabetes: Yes CBG done?: No (FBS 125 per patient) Did pt. bring in CBG monitor from home?: No  How often do you need to have someone help you when you read instructions, pamphlets, or other written  materials from your doctor or pharmacy?: 1 - Never  Interpreter Needed?: No  Information entered by :: Tora Kindred, CMA   Activities of Daily Living    04/22/2023   12:53 PM 03/24/2023    1:27 PM  In your present state of health, do you have any difficulty performing the following activities:  Hearing? 1 1  Vision? 0 0  Difficulty concentrating or making decisions? 0 1  Walking or climbing stairs? 1 1  Comment gets tired climbing steps   Dressing or bathing? 0 0  Doing errands, shopping? 0 0  Preparing Food and eating ? N   Using the Toilet? N   In the past six months, have you accidently leaked urine? N   Do you have problems with loss of bowel control? N   Managing your Medications? N   Managing your Finances? N   Housekeeping or managing your Housekeeping? N     Patient Care Team: Dorcas Carrow, DO as PCP - General (Family Medicine) Gabriel Cirri, NP (Inactive) as PCP - Family Medicine (Nurse Practitioner) Debbe Odea, MD as PCP - Cardiology (Cardiology) Merri Ray, MD as Referring Physician (Physical Medicine and Rehabilitation) Creig Hines, MD as Consulting Physician (Hematology and Oncology)  Indicate any recent Medical Services you may have received from other than [facility] providers in the past year (date may be approximate).     Assessment:   This is a routine wellness examination for  [place].  Hearing/Vision screen Hearing Screening - Comments:: Slight hearing loss-declined referral Vision Screening - Comments:: Gets routine eye exams   Goals Addressed             This Visit's Progress    Patient Stated       Drink more water and lose 20lbs      Depression Screen    04/22/2023   12:59 PM 03/24/2023    1:28 PM 03/24/2023    1:05 PM 03/22/2022   11:22 AM 03/20/2022    9:48 AM 03/01/2022    2:02 PM 12/18/2021    9:31 AM  PHQ 2/9 Scores  PHQ - 2 Score 0 0  0 0 0 2  PHQ- 9 Score 0   2 3 2 8   Exception Documentation   Patient refusal        Fall Risk    04/22/2023    1:04 PM 03/24/2023    1:28 PM 05/30/2022   10:25 AM 03/22/2022   11:22 AM 03/20/2022    9:43 AM  Fall Risk   Falls in the past year? 0 0 0 0 0  Number falls in past yr: 0 0 0 0 0  Injury with Fall? 0 0 0 0 0  Risk for fall due to : No Fall Risks  No Fall Risks No Fall Risks   Follow up Falls prevention discussed Falls evaluation completed Falls evaluation completed Falls evaluation completed Falls evaluation completed;Education provided;Falls prevention discussed    MEDICARE RISK AT HOME: Medicare Risk at Home Any stairs in or around the home?: Yes If so, are there any without handrails?: No Home free of loose throw rugs in walkways, pet beds, electrical cords, etc?: Yes Adequate lighting in your home to reduce risk of falls?: Yes Life alert?: No Use of a cane, walker or w/c?: No Grab bars in the bathroom?: Yes Shower chair or bench in shower?: Yes Elevated toilet seat or a handicapped toilet?: Yes  TIMED UP AND GO:  Was the test performed?  Yes  Length of time to ambulate 10 feet: 10 sec Gait steady and fast without use of assistive device    Cognitive Function:        04/22/2023    1:05 PM 03/24/2023    1:31 PM 03/20/2022    9:45 AM 03/19/2021    9:52 AM 03/10/2020    1:12 PM  6CIT Screen  What Year? 0 points 0 points 0 points 0 points 0 points  What month? 0 points 0 points 0  points 0 points 0 points  What time? 0 points 0 points 0 points 3 points 0 points  Count back from 20 0 points 0 points 0 points 0 points 0 points  Months in reverse 0 points 2 points 2 points 0 points 0 points  Repeat phrase 4 points 2 points 2 points 10 points 4 points  Total Score 4 points 4 points 4 points 13 points 4 points    Immunizations Immunization History  Administered Date(s) Administered   Fluad Quad(high Dose 65+) 05/20/2019, 05/02/2020, 05/22/2021, 05/09/2022   Influenza, High Dose Seasonal PF 07/10/2016, 04/18/2017, 05/26/2018   Influenza,inj,Quad PF,6+ Mos 07/04/2015   Influenza-Unspecified 07/04/2015   Moderna Sars-Covid-2 Vaccination 10/06/2019, 11/03/2019, 06/07/2020   Pneumococcal Conjugate-13 04/13/2014   Pneumococcal Polysaccharide-23 10/14/2014   Td 03/25/2003, 11/28/2020   Td (Adult), 2 Lf Tetanus Toxid, Preservative Free 03/25/2003, 11/28/2020   Tdap 10/05/2010   Zoster, Live 09/10/2006    TDAP status: Up to date  Flu Vaccine status: Due, Education has been provided regarding the importance of this vaccine. Advised may receive this vaccine at local pharmacy or Health Dept. Aware to provide a copy of the vaccination record if obtained from local pharmacy or Health Dept. Verbalized acceptance and understanding.  Pneumococcal vaccine status: Up to date  Covid-19 vaccine status: Declined, Education has been provided regarding the importance of this vaccine but patient still declined. Advised may receive this vaccine at local pharmacy or Health Dept.or vaccine clinic. Aware to provide a copy of the vaccination record if obtained from local pharmacy or Health Dept. Verbalized acceptance and understanding.  Qualifies for Shingles Vaccine? Yes   Zostavax completed No   Shingrix Completed?: No.    Education has been provided regarding the importance of this vaccine. Patient has been advised to call insurance company to determine out of pocket expense if they have  not yet received this vaccine. Advised may also receive vaccine at local pharmacy or Health Dept. Verbalized acceptance and understanding.  Screening Tests Health Maintenance  Topic Date Due   Zoster Vaccines- Shingrix (1 of 2) 07/08/1996   INFLUENZA VACCINE  03/13/2023   COVID-19 Vaccine (4 - 2023-24 season) 04/13/2023   Diabetic kidney evaluation - Urine ACR  05/31/2023   FOOT EXAM  05/31/2023   MAMMOGRAM  08/23/2023   OPHTHALMOLOGY EXAM  08/27/2023   HEMOGLOBIN A1C  09/24/2023   Diabetic kidney evaluation - eGFR measurement  11/26/2023   Medicare Annual Wellness (AWV)  04/21/2024   Colonoscopy  08/30/2025   DEXA SCAN  08/23/2027   DTaP/Tdap/Td (6 - Td or Tdap) 11/29/2030   Pneumonia Vaccine 31+ Years old  Completed   Hepatitis C Screening  Completed   HPV VACCINES  Aged Out    Health Maintenance  Health Maintenance Due  Topic Date Due   Zoster Vaccines- Shingrix (1 of 2) 07/08/1996   INFLUENZA VACCINE  03/13/2023   COVID-19 Vaccine (4 - 2023-24 season) 04/13/2023    Colorectal cancer screening: No longer required.  Mammogram status: Completed 08/22/22. Repeat every year  Bone Density status: Completed 08/22/22. Results reflect: Bone density results: OSTEOPENIA. Repeat every 5 years.  Lung Cancer Screening: (Low Dose CT Chest recommended if Age 66-80 years, 20 pack-year currently smoking OR have quit w/in 15years.) does not qualify.   Lung Cancer Screening Referral: n/a  Additional Screening:  Hepatitis C Screening: does not qualify; Completed 01/03/16  Vision Screening: Recommended annual ophthalmology exams for early detection of glaucoma and other disorders of the eye. Is the patient up to date with their annual eye exam?  Yes  Who is the provider or what is the name of the office in which the patient attends annual eye exams? [facility], Dr. Dellie Burns If pt is not established with a provider, would they like to be referred to a provider to establish care? No .    Dental Screening: Recommended annual dental exams for proper oral hygiene  Diabetic Foot Exam: Diabetic Foot Exam: Overdue, Pt has been advised about the importance in completing this exam. Pt is scheduled for diabetic foot exam on 06/20/23.  Community Resource Referral / Chronic Care Management: CRR required this visit?  No   CCM required this visit?  No     Plan:     I have personally reviewed and noted the following in the patient's chart:   Medical and social history Use of alcohol, tobacco or illicit drugs  Current medications and supplements including opioid prescriptions. Patient is not currently taking opioid prescriptions. Functional ability and status Nutritional status Physical activity Advanced directives List of other physicians Hospitalizations, surgeries, and ER visits in previous 12 months Vitals Screenings to include cognitive, depression, and falls Referrals and appointments  In addition, I have reviewed and discussed with patient certain preventive protocols, quality metrics, and best practice recommendations. A written personalized care plan for preventive services as well as general preventive health recommendations were provided to patient.     Tora Kindred, CMA   04/22/2023   After Visit Summary: My Chart  Nurse Notes:  6 CIT Score - 4 Order placed for Mammogram due after 08/22/22. Declined referral to DM & Nutrition Education. Declined referral to ENT for hearing loss. Will get flu shot in the fall. Declined Covid vaccine. Will need DM foot exam at 06/20/23 OV.

## 2023-04-22 NOTE — Addendum Note (Signed)
Addended by: Dorcas Carrow on: 04/22/2023 02:06 PM   Modules accepted: Level of Service

## 2023-04-30 ENCOUNTER — Other Ambulatory Visit: Payer: Self-pay | Admitting: Family Medicine

## 2023-04-30 DIAGNOSIS — N1831 Chronic kidney disease, stage 3a: Secondary | ICD-10-CM

## 2023-05-01 NOTE — Telephone Encounter (Signed)
Requested Prescriptions  Pending Prescriptions Disp Refills   lisinopril (ZESTRIL) 30 MG tablet [Pharmacy Med Name: LISINOPRIL TAB 30MG ] 90 tablet 0    Sig: TAKE 1 TABLET DAILY     Cardiovascular:  ACE Inhibitors Failed - 04/30/2023  2:14 AM      Failed - Cr in normal range and within 180 days    Creatinine, Ser  Date Value Ref Range Status  11/26/2022 1.22 (H) 0.57 - 1.00 mg/dL Final         Passed - K in normal range and within 180 days    Potassium  Date Value Ref Range Status  11/26/2022 4.8 3.5 - 5.2 mmol/L Final         Passed - Patient is not pregnant      Passed - Last BP in normal range    BP Readings from Last 1 Encounters:  04/22/23 118/60         Passed - Valid encounter within last 6 months    Recent Outpatient Visits           1 month ago Encounter for [employer] annual wellness exam   [facility] [place], Megan P, DO   1 month ago Hordeolum externum of right lower eyelid   [place] [facility] [place], Megan P, DO   3 months ago Type 2 diabetes mellitus with stage 3a chronic kidney disease, without long-term current use of insulin (HCC)   [place] [facility] [place], Megan P, DO   5 months ago Palpitations   [place] [facility] [place], Megan P, DO   9 months ago 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)   [place] [place], Megan P, DO       Future Appointments             In 1 month Spaziani, Megan P, DO [place] [facility]             metFORMIN (GLUCOPHAGE-XR) 500 MG 24 hr tablet [Pharmacy Med Name: METFORMIN ER TAB 500MG  GP] 360 tablet 0    Sig: TAKE 2 TABLETS TWICE A DAY     Endocrinology:  Diabetes - Biguanides Failed - 04/30/2023  2:14 AM      Failed - Cr in normal range and within 360 days    Creatinine, Ser  Date Value Ref Range Status  11/26/2022 1.22 (H) 0.57 - 1.00 mg/dL Final          Failed - eGFR in normal range and within 360 days    GFR calc Af Amer  Date Value Ref Range Status  05/23/2020 79 >59 mL/min/1.73 Final    Comment:    **[facility] currently reports eGFR in compliance with the current**   recommendations of the [employer]. [facility] will   update reporting as new guidelines are published from the NKF-ASN   Task force.    GFR, Estimated  Date Value Ref Range Status  04/17/2021 53 (L) >60 mL/min Final    Comment:    (NOTE) Calculated using the CKD-EPI Creatinine Equation (2021)    eGFR  Date Value Ref Range Status  11/26/2022 46 (L) >59 mL/min/1.73 Final         Passed - HBA1C is between 0 and 7.9 and within 180 days    HB A1C (BAYER DCA - WAIVED)  Date Value Ref Range Status  03/24/2023 6.6 (H) 4.8 - 5.6 %  Final    Comment:             Prediabetes: 5.7 - 6.4          Diabetes: >6.4          Glycemic control for adults with diabetes: <7.0          Passed - B12 Level in normal range and within 720 days    Vitamin B-12  Date Value Ref Range Status  01/08/2023 1,245 232 - 1,245 pg/mL Final         Passed - Valid encounter within last 6 months    Recent Outpatient Visits           1 month ago Encounter for Medicare annual wellness exam   [place] [facility] [place], Megan P, DO   1 month ago Hordeolum externum of right lower eyelid   [place] [facility] [place], Megan P, DO   3 months ago Type 2 diabetes mellitus with stage 3a chronic kidney disease, without long-term current use of insulin (HCC)   [place] [facility] [place], Megan P, DO   5 months ago Palpitations   [place] [facility] [place], Megan P, DO   9 months ago 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)   [place] [place], [place], DO       Future Appointments             In 1 month Focht, Megan P, DO [place] [facility]            Passed - CBC within normal limits and completed in the last 12 months    WBC  Date Value Ref Range Status  03/10/2023 6.9 4.0 - 10.5 K/uL Final   RBC  Date Value Ref Range Status  03/10/2023 3.38 (L) 3.87 - 5.11 MIL/uL Final   Hemoglobin  Date Value Ref Range Status  03/10/2023 10.5 (L) 12.0 - 15.0 g/dL Final  16/05/9603 54.0 11.1 - 15.9 g/dL Final   HCT  Date Value Ref Range Status  03/10/2023 32.6 (L) 36.0 - 46.0 % Final   Hematocrit  Date Value Ref Range Status  01/08/2023 33.8 (L) 34.0 - 46.6 % Final   MCHC  Date Value Ref Range Status  03/10/2023 32.2 30.0 - 36.0 g/dL Final   [facility]  Date Value Ref Range Status  03/10/2023 31.1 26.0 - 34.0 pg Final   MCV  Date Value Ref Range Status  03/10/2023 96.4 80.0 - 100.0 fL Final  01/08/2023 96 79 - 97 fL Final   No results found for: "PLTCOUNTKUC", "LABPLAT", "POCPLA" RDW  Date Value Ref Range Status  03/10/2023 12.0 11.5 - 15.5 % Final  01/08/2023 13.1 11.7 - 15.4 % Final          clopidogrel (PLAVIX) 75 MG tablet [Pharmacy Med Name: CLOPIDOGREL  TAB 75MG ] 90 tablet 0    Sig: TAKE 1 TABLET DAILY     Hematology: Antiplatelets - clopidogrel Failed - 04/30/2023  2:14 AM      Failed - HCT in normal range and within 180 days    HCT  Date Value Ref Range Status  03/10/2023 32.6 (L) 36.0 - 46.0 % Final   Hematocrit  Date Value Ref Range Status  01/08/2023 33.8 (L) 34.0 - 46.6 % Final         Failed - HGB in normal range and within 180 days  Hemoglobin  Date Value Ref Range Status  03/10/2023 10.5 (L) 12.0 - 15.0 g/dL Final  82/95/6213 08.6 11.1 - 15.9 g/dL Final         Failed - Cr in normal range and within 360 days    Creatinine, Ser  Date Value Ref Range Status  11/26/2022 1.22 (H) 0.57 - 1.00 mg/dL Final         Passed - PLT in normal range and within 180 days    Platelets  Date Value Ref Range Status  03/10/2023 227 150 - 400 K/uL Final  01/08/2023 204 150 - 450  x10E3/uL Final         Passed - Valid encounter within last 6 months    Recent Outpatient Visits           1 month ago Encounter for [employer] annual wellness exam   [place] [facility] [place], Megan P, DO   1 month ago Hordeolum externum of right lower eyelid   [place] [facility] [place], Megan P, DO   3 months ago Type 2 diabetes mellitus with stage 3a chronic kidney disease, without long-term current use of insulin (HCC)   [place] [facility] [place], [place], DO   5 months ago Palpitations   [place] [facility] [place], Megan P, DO   9 months ago 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)   [place] [facility] Dorcas Carrow, DO       Future Appointments             In 1 month Degraffen[place], Oralia Rud, DO [place] [facility]

## 2023-05-22 DIAGNOSIS — L659 Nonscarring hair loss, unspecified: Secondary | ICD-10-CM | POA: Diagnosis not present

## 2023-05-22 DIAGNOSIS — X32XXXA Exposure to sunlight, initial encounter: Secondary | ICD-10-CM | POA: Diagnosis not present

## 2023-05-22 DIAGNOSIS — L570 Actinic keratosis: Secondary | ICD-10-CM | POA: Diagnosis not present

## 2023-05-23 DIAGNOSIS — E039 Hypothyroidism, unspecified: Secondary | ICD-10-CM | POA: Diagnosis not present

## 2023-05-23 DIAGNOSIS — Z008 Encounter for other general examination: Secondary | ICD-10-CM | POA: Diagnosis not present

## 2023-05-23 DIAGNOSIS — I129 Hypertensive chronic kidney disease with stage 1 through stage 4 chronic kidney disease, or unspecified chronic kidney disease: Secondary | ICD-10-CM | POA: Diagnosis not present

## 2023-05-23 DIAGNOSIS — E663 Overweight: Secondary | ICD-10-CM | POA: Diagnosis not present

## 2023-05-23 DIAGNOSIS — N1831 Chronic kidney disease, stage 3a: Secondary | ICD-10-CM | POA: Diagnosis not present

## 2023-05-23 DIAGNOSIS — B0229 Other postherpetic nervous system involvement: Secondary | ICD-10-CM | POA: Diagnosis not present

## 2023-05-23 DIAGNOSIS — Z6829 Body mass index (BMI) 29.0-29.9, adult: Secondary | ICD-10-CM | POA: Diagnosis not present

## 2023-05-23 DIAGNOSIS — E785 Hyperlipidemia, unspecified: Secondary | ICD-10-CM | POA: Diagnosis not present

## 2023-05-23 DIAGNOSIS — M1990 Unspecified osteoarthritis, unspecified site: Secondary | ICD-10-CM | POA: Diagnosis not present

## 2023-05-27 ENCOUNTER — Other Ambulatory Visit: Payer: Self-pay | Admitting: Family Medicine

## 2023-05-27 NOTE — Telephone Encounter (Signed)
Requested Prescriptions  Pending Prescriptions Disp Refills   chlorthalidone (HYGROTON) 25 MG tablet [Pharmacy Med Name: CHLORTHALID  TAB 25MG ] 90 tablet 0    Sig: TAKE 1 TABLET DAILY     Cardiovascular: Diuretics - Thiazide Failed - 05/27/2023  2:05 AM      Failed - Cr in normal range and within 180 days    Creatinine, Ser  Date Value Ref Range Status  11/26/2022 1.22 (H) 0.57 - 1.00 mg/dL Final         Failed - K in normal range and within 180 days    Potassium  Date Value Ref Range Status  11/26/2022 4.8 3.5 - 5.2 mmol/L Final         Failed - Na in normal range and within 180 days    Sodium  Date Value Ref Range Status  11/26/2022 145 (H) 134 - 144 mmol/L Final         Passed - Last BP in normal range    BP Readings from Last 1 Encounters:  04/22/23 118/60         Passed - Valid encounter within last 6 months    Recent Outpatient Visits           2 months ago Encounter for [employer] annual wellness exam   [place] [facility] [place], Megan P, DO   2 months ago Hordeolum externum of right lower eyelid   [place] [facility] [place], Megan P, DO   4 months ago Type 2 diabetes mellitus with stage 3a chronic kidney disease, without long-term current use of insulin (HCC)   [place] [facility] [place], [place], DO   6 months ago Palpitations   [place] [facility] [place], Megan P, DO   10 months ago 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)   [place] [facility] [place], Oralia Rud, DO       Future Appointments             In 3 weeks Laural Benes, Oralia Rud, DO [facility]             SYNTHROID 50 MCG tablet [Pharmacy Med Name: SYNTHROID TAB 50MCG] 90 tablet 0    Sig: TAKE 1 TABLET DAILY     Endocrinology:  Hypothyroid Agents Passed - 05/27/2023  2:05 AM      Passed - TSH in normal range and within 360 days    TSH  Date Value  Ref Range Status  11/26/2022 2.070 0.450 - 4.500 uIU/mL Final         Passed - Valid encounter within last 12 months    Recent Outpatient Visits           2 months ago Encounter for [employer] annual wellness exam   [place] [facility] [place], Megan P, DO   2 months ago Hordeolum externum of right lower eyelid   [place] [facility] [place], Megan P, DO   4 months ago Type 2 diabetes mellitus with stage 3a chronic kidney disease, without long-term current use of insulin (HCC)   [place] [facility] [place], [place], DO   6 months ago Palpitations   [place] [facility] [place], Megan P, DO   10 months ago 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 [facility])   [place] [facility] [place], Megan P, DO  Future Appointments             In 3 weeks Laural Benes, Oralia Rud, DO [facility]

## 2023-06-20 ENCOUNTER — Encounter: Payer: Self-pay | Admitting: Family Medicine

## 2023-06-20 ENCOUNTER — Ambulatory Visit (INDEPENDENT_AMBULATORY_CARE_PROVIDER_SITE_OTHER): Payer: No Typology Code available for payment source | Admitting: Family Medicine

## 2023-06-20 VITALS — BP 138/62 | HR 45 | Ht 61.0 in | Wt 156.2 lb

## 2023-06-20 DIAGNOSIS — Z7984 Long term (current) use of oral hypoglycemic drugs: Secondary | ICD-10-CM | POA: Diagnosis not present

## 2023-06-20 DIAGNOSIS — R5382 Chronic fatigue, unspecified: Secondary | ICD-10-CM | POA: Diagnosis not present

## 2023-06-20 DIAGNOSIS — I129 Hypertensive chronic kidney disease with stage 1 through stage 4 chronic kidney disease, or unspecified chronic kidney disease: Secondary | ICD-10-CM

## 2023-06-20 DIAGNOSIS — I4729 Other ventricular tachycardia: Secondary | ICD-10-CM | POA: Diagnosis not present

## 2023-06-20 DIAGNOSIS — I4719 Other supraventricular tachycardia: Secondary | ICD-10-CM | POA: Insufficient documentation

## 2023-06-20 DIAGNOSIS — E1122 Type 2 diabetes mellitus with diabetic chronic kidney disease: Secondary | ICD-10-CM

## 2023-06-20 DIAGNOSIS — I272 Pulmonary hypertension, unspecified: Secondary | ICD-10-CM

## 2023-06-20 DIAGNOSIS — N1831 Hypertensive chronic kidney disease with stage 1 through stage 4 chronic kidney disease, or unspecified chronic kidney disease: Secondary | ICD-10-CM

## 2023-06-20 DIAGNOSIS — Z23 Encounter for immunization: Secondary | ICD-10-CM | POA: Diagnosis not present

## 2023-06-20 DIAGNOSIS — M79605 Pain in left leg: Secondary | ICD-10-CM

## 2023-06-20 DIAGNOSIS — R001 Bradycardia, unspecified: Secondary | ICD-10-CM | POA: Diagnosis not present

## 2023-06-20 LAB — BAYER DCA HB A1C WAIVED: HB A1C (BAYER DCA - WAIVED): 6.7 % — ABNORMAL HIGH (ref 4.8–5.6)

## 2023-06-20 LAB — MICROALBUMIN, URINE WAIVED
Creatinine, Urine Waived: 100 mg/dL (ref 10–300)
Microalb, Ur Waived: 80 mg/L — ABNORMAL HIGH (ref 0–19)

## 2023-06-20 MED ORDER — NORTRIPTYLINE HCL 10 MG PO CAPS: 10.0000 mg | ORAL_CAPSULE | Freq: Every day | ORAL | 1 refills | Status: DC

## 2023-06-20 MED ORDER — ROSUVASTATIN CALCIUM 10 MG PO TABS: 10.0000 mg | ORAL_TABLET | Freq: Every day | ORAL | 1 refills | Status: DC

## 2023-06-20 MED ORDER — LISINOPRIL 30 MG PO TABS: 30.0000 mg | ORAL_TABLET | Freq: Every day | ORAL | 0 refills | Status: DC

## 2023-06-20 MED ORDER — LEVOTHYROXINE SODIUM 50 MCG PO TABS: 50.0000 ug | ORAL_TABLET | Freq: Every day | ORAL | 0 refills | Status: DC

## 2023-06-20 MED ORDER — CLOPIDOGREL BISULFATE 75 MG PO TABS: 75.0000 mg | ORAL_TABLET | Freq: Every day | ORAL | 0 refills | Status: DC

## 2023-06-20 MED ORDER — METFORMIN HCL ER 500 MG PO TB24: ORAL_TABLET | ORAL | 0 refills | Status: DC

## 2023-06-20 MED ORDER — CHLORTHALIDONE 25 MG PO TABS: 25.0000 mg | ORAL_TABLET | Freq: Every day | ORAL | 0 refills | Status: DC

## 2023-06-20 NOTE — Progress Notes (Signed)
BP 138/62   Pulse (!) 45   Ht 5\' 1"  (1.549 m)   Wt 156 lb 3.2 oz (70.9 kg)   LMP  (LMP Unknown)   SpO2 98%   BMI 29.51 kg/m    Subjective:    Patient ID: Teresa [name], female    DOB: 15-Jan-1946, 77 y.o.   MRN: [id number]  HPI: Teresa [name] is a 77 y.o. female  Chief Complaint  Patient presents with   Diabetes   Medication Problem    Patient says she would like to discuss her Plavix prescription, as she is noticing more bruising and says it has been quite a bit. Patient wants to discuss as she is taking Aspirin as well.    Leg Problem    Patient says she is noticing some cramping in her legs at night.    DIABETES Hypoglycemic episodes:no Polydipsia/polyuria: no Visual disturbance: no Chest pain: no Paresthesias: no Glucose Monitoring: yes  Accucheck frequency: Daily  Fasting glucose: 98-130 Taking Insulin?: no Blood Pressure Monitoring: not checking Retinal Examination: Up to Date Foot Exam: Up to Date Diabetic Education: Completed Pneumovax: Up to Date Influenza: Up to Date Aspirin: no  FATIGUE Duration:  chronic Severity: moderate  Onset: gradual Context when symptoms started:  unknown Symptoms improve with rest: no  Depressive symptoms: no Stress/anxiety: no Insomnia: no  Snoring: no Observed apnea by bed partner: no Daytime hypersomnolence:yes Wakes feeling refreshed: no History of sleep study: no Dysnea on exertion:  yes Orthopnea/PND: yes Chest pain: no Chronic cough: no Lower extremity edema: no Arthralgias:no Myalgias: no Weakness: no Rash: no  Relevant past medical, surgical, family and social history reviewed and updated as indicated. Interim medical history since our last visit reviewed. Allergies and medications reviewed and updated.  Review of Systems  Constitutional: Negative.   Respiratory: Negative.    Cardiovascular: Negative.   Musculoskeletal:  Positive for myalgias. Negative for arthralgias, back pain, gait  problem, joint swelling, neck pain and neck stiffness.  Skin: Negative.   Neurological: Negative.   Psychiatric/Behavioral: Negative.      Per HPI unless specifically indicated above     Objective:    BP 138/62   Pulse (!) 45   Ht 5\' 1"  (1.549 m)   Wt 156 lb 3.2 oz (70.9 kg)   LMP  (LMP Unknown)   SpO2 98%   BMI 29.51 kg/m   Wt Readings from Last 3 Encounters:  06/20/23 156 lb 3.2 oz (70.9 kg)  04/22/23 158 lb 6.4 oz (71.8 kg)  03/24/23 157 lb 9.6 oz (71.5 kg)    Physical Exam Vitals and nursing note reviewed.  Constitutional:      General: She is not in acute distress.    Appearance: Normal appearance. She is not ill-appearing, toxic-appearing or diaphoretic.  HENT:     Head: Normocephalic and atraumatic.     Right Ear: External ear normal.     Left Ear: External ear normal.     Nose: Nose normal.     Mouth/Throat:     Mouth: Mucous membranes are moist.     Pharynx: Oropharynx is clear.  Eyes:     General: No scleral icterus.       Right eye: No discharge.        Left eye: No discharge.     Extraocular Movements: Extraocular movements intact.     Conjunctiva/sclera: Conjunctivae normal.     Pupils: Pupils are equal, round, and reactive to light.  Cardiovascular:  Rate and Rhythm: Normal rate and regular rhythm.     Pulses: Normal pulses.     Heart sounds: Normal heart sounds. No murmur heard.    No friction rub. No gallop.  Pulmonary:     Effort: Pulmonary effort is normal. No respiratory distress.     Breath sounds: Normal breath sounds. No stridor. No wheezing, rhonchi or rales.  Chest:     Chest wall: No tenderness.  Musculoskeletal:        General: Normal range of motion.     Cervical back: Normal range of motion and neck supple.  Skin:    General: Skin is warm and dry.     Capillary Refill: Capillary refill takes less than 2 seconds.     Coloration: Skin is not jaundiced or pale.     Findings: No bruising, erythema, lesion or rash.  Neurological:      General: No focal deficit present.     Mental Status: She is alert and oriented to person, place, and time. Mental status is at baseline.  Psychiatric:        Mood and Affect: Mood normal.        Behavior: Behavior normal.        Thought Content: Thought content normal.        Judgment: Judgment normal.     Results for orders placed or performed in visit on 06/20/23  Bayer DCA Hb A1c Waived  Result Value Ref Range   HB A1C (BAYER DCA - WAIVED) 6.7 (H) 4.8 - 5.6 %  Microalbumin, Urine Waived  Result Value Ref Range   Microalb, Ur Waived 80 (H) 0 - 19 mg/L   Creatinine, Urine Waived 100 10 - 300 mg/dL   Microalb/Creat Ratio 30-300 (H) <30 mg/g      Assessment & Plan:   Problem List Items Addressed This Visit       Cardiovascular and Mediastinum   Pulmonary HTN (HCC)    Will get her back into cardiology. Await their input. Call with any concerns.       Relevant Medications   chlorthalidone (HYGROTON) 25 MG tablet   lisinopril (ZESTRIL) 30 MG tablet   rosuvastatin (CRESTOR) 10 MG tablet   Other Relevant Orders   Ambulatory referral to Cardiology   NSVT (nonsustained ventricular tachycardia) (HCC)    Will get her back into cardiology. Await their input. Call with any concerns.       Relevant Medications   chlorthalidone (HYGROTON) 25 MG tablet   lisinopril (ZESTRIL) 30 MG tablet   rosuvastatin (CRESTOR) 10 MG tablet   Other Relevant Orders   Ambulatory referral to Cardiology     Endocrine   Type 2 diabetes mellitus with stage 3 chronic kidney disease, without long-term current use of insulin (HCC) - Primary    Stable with A1c of 6.6. Continue current regimen. Continue to monitor. Call with any concerns.       Relevant Medications   lisinopril (ZESTRIL) 30 MG tablet   metFORMIN (GLUCOPHAGE-XR) 500 MG 24 hr tablet   rosuvastatin (CRESTOR) 10 MG tablet   Other Relevant Orders   Bayer DCA Hb A1c Waived (Completed)   Microalbumin, Urine Waived (Completed)      Genitourinary   Hypertensive CKD (chronic kidney disease)    Under good control on current regimen. Continue current regimen. Continue to monitor. Call with any concerns. Refills given.        Relevant Medications   lisinopril (ZESTRIL) 30 MG tablet  Other   Bradycardia    Will get her back into cardiology. Await their input. Call with any concerns.       Relevant Orders   Ambulatory referral to Cardiology   Other Visit Diagnoses     Chronic fatigue       Will get her back into cardiology. Await their input. Call with any concerns.   Relevant Orders   Ambulatory referral to Cardiology   Left leg pain       Increase hydration. Call if not improving and we'll look into it further.   Needs flu shot       Flu shot given today.   Relevant Orders   Flu Vaccine Trivalent High Dose (Fluad) (Completed)        Follow up plan: Return in about 3 months (around 09/20/2023) for physical.

## 2023-06-20 NOTE — Assessment & Plan Note (Signed)
Will get her back into cardiology. Await their input. Call with any concerns.

## 2023-06-20 NOTE — Assessment & Plan Note (Signed)
Under good control on current regimen. Continue current regimen. Continue to monitor. Call with any concerns. Refills given.   

## 2023-06-20 NOTE — Assessment & Plan Note (Signed)
Stable with A1c of 6.6. Continue current regimen. Continue to monitor. Call with any concerns.

## 2023-06-30 ENCOUNTER — Ambulatory Visit: Payer: Self-pay

## 2023-06-30 NOTE — Telephone Encounter (Signed)
  Chief Complaint: Questions regarding nortriptyline 10 mg Symptoms:  Frequency:  Pertinent Negatives: Patient denies  Disposition: [] ED /[] Urgent Care (no appt availability in office) / [] Appointment(In office/virtual)/ []  [place] [facility]/ [] Home Care/ [] Refused Recommended Disposition /[] [place] [facility]/ []  Follow-up with PCP Additional Notes: Returned pt's call. Pt states that she does not think that nortriptyline is her medication and was prescribed in error. Pt states she has not taken this in the past.  Medication is on her med list.  Pt will not take this medication until she hears back from Dr. Laural Benes.     Summary: medication questions   Patient has some question about medications she was just given.     Reason for Disposition  [1] Caller has URGENT medicine question about med that PCP or specialist prescribed AND [2] triager unable to answer question  Answer Assessment - Initial Assessment Questions 1. NAME of MEDICINE: "What medicine(s) are you calling about?"     Nortriptyline 10 mg 2. QUESTION: "What is your question?" (e.g., double dose of medicine, side effect)     When/why was this prescribed 3. PRESCRIBER: "Who prescribed the medicine?" Reason: if prescribed by specialist, call should be referred to that group.     Dr. Laural Benes 06/20/2023  Protocols used: Medication Question Call-A-AH

## 2023-07-01 NOTE — Telephone Encounter (Signed)
Routing to provider to advise. Is the patient supposed to be on this medication?

## 2023-07-04 ENCOUNTER — Telehealth: Payer: Self-pay | Admitting: Family Medicine

## 2023-07-04 NOTE — Telephone Encounter (Signed)
Copied from CRM [id number]. Topic: General - Inquiry >> Jul 04, 2023  8:44 AM Teresa [name] wrote: Patient called to get an update on the message that was sent to pcp on 11/19 about nortriptyline 10mg  that she was given. Patient states that she was not aware that she was starting a new medication. Please advise.

## 2023-07-04 NOTE — Telephone Encounter (Signed)
Placed comment on another note

## 2023-07-04 NOTE — Telephone Encounter (Signed)
Spoke with patient and made her aware of Jolene's recommendations. Patient states she has not been taking the medication nor is she having any pain since the shingles outbreak. Patient verbalized she would like to the prescription to be removed.

## 2023-07-07 ENCOUNTER — Inpatient Hospital Stay: Payer: No Typology Code available for payment source | Attending: Oncology

## 2023-07-07 DIAGNOSIS — D508 Other iron deficiency anemias: Secondary | ICD-10-CM

## 2023-07-07 DIAGNOSIS — D509 Iron deficiency anemia, unspecified: Secondary | ICD-10-CM | POA: Insufficient documentation

## 2023-07-07 DIAGNOSIS — D649 Anemia, unspecified: Secondary | ICD-10-CM | POA: Insufficient documentation

## 2023-07-07 LAB — IRON AND TIBC
Iron: 77 ug/dL (ref 28–170)
Saturation Ratios: 29 % (ref 10.4–31.8)
TIBC: 269 ug/dL (ref 250–450)
UIBC: 192 ug/dL

## 2023-07-07 LAB — CBC
HCT: 31.7 % — ABNORMAL LOW (ref 36.0–46.0)
Hemoglobin: 10.3 g/dL — ABNORMAL LOW (ref 12.0–15.0)
MCH: 31.4 pg (ref 26.0–34.0)
MCHC: 32.5 g/dL (ref 30.0–36.0)
MCV: 96.6 fL (ref 80.0–100.0)
Platelets: 214 10*3/uL (ref 150–400)
RBC: 3.28 MIL/uL — ABNORMAL LOW (ref 3.87–5.11)
RDW: 12.3 % (ref 11.5–15.5)
WBC: 6.1 10*3/uL (ref 4.0–10.5)
nRBC: 0 % (ref 0.0–0.2)

## 2023-07-07 LAB — FERRITIN: Ferritin: 150 ng/mL (ref 11–307)

## 2023-07-15 ENCOUNTER — Other Ambulatory Visit: Payer: Self-pay | Admitting: Family Medicine

## 2023-07-15 NOTE — Telephone Encounter (Signed)
Requested medication (s) are due for refill today - expired Rx  Requested medication (s) are on the active medication list -yes  Future visit scheduled -yes  Last refill: 10/05/21 30g  Notes to clinic: non delegated Rx  Requested Prescriptions  Pending Prescriptions Disp Refills   clobetasol ointment (TEMOVATE) 0.05 % [Pharmacy Med Name: Clobetasol Propionate 0.05 % External Ointment] 30 g 0    Sig: APPLY  OINTMENT TOPICALLY TWICE DAILY     Not Delegated - Dermatology:  Corticosteroids Failed - 07/15/2023  9:50 AM      Failed - This refill cannot be delegated      Passed - Valid encounter within last 12 months    Recent Outpatient Visits           3 weeks ago Type 2 diabetes mellitus with stage 3a chronic kidney disease, without long-term current use of insulin (HCC)   [place] [facility] [place], Megan P, DO   3 months ago Encounter for [employer] annual wellness exam   [place] [facility] [place], Megan P, DO   4 months ago Hordeolum externum of right lower eyelid   [place] [facility] [place], Megan P, DO   6 months ago Type 2 diabetes mellitus with stage 3a chronic kidney disease, without long-term current use of insulin (HCC)   [place] [facility] [place], [place], DO   7 months ago Palpitations   [place] [facility] [place], [place], DO       Future Appointments             In 1 month Agbor-Etang, Arlys John, MD [facility] [place]   In 2 months Dorcas Carrow, DO [facility]               Requested Prescriptions  Pending Prescriptions Disp Refills   clobetasol ointment (TEMOVATE) 0.05 % [Pharmacy Med Name: Clobetasol Propionate 0.05 % External Ointment] 30 g 0    Sig: APPLY  OINTMENT TOPICALLY TWICE DAILY     Not Delegated - Dermatology:  Corticosteroids Failed - 07/15/2023  9:50 AM      Failed - This refill cannot be delegated       Passed - Valid encounter within last 12 months    Recent Outpatient Visits           3 weeks ago Type 2 diabetes mellitus with stage 3a chronic kidney disease, without long-term current use of insulin (HCC)   [place] [facility] [place], Megan P, DO   3 months ago Encounter for [employer] annual wellness exam   [place] [facility] [place], Megan P, DO   4 months ago Hordeolum externum of right lower eyelid   [place] [facility] [place], Megan P, DO   6 months ago Type 2 diabetes mellitus with stage 3a chronic kidney disease, without long-term current use of insulin (HCC)   [place] [facility] [place], [place], DO   7 months ago Palpitations   [place] [facility] [place], [place], DO       Future Appointments             In 1 month Agbor-Etang, Arlys John, MD [facility] [place]   In 2 months Dorcas Carrow, DO [place] [facility]

## 2023-07-28 ENCOUNTER — Encounter: Payer: Self-pay | Admitting: Family Medicine

## 2023-07-28 ENCOUNTER — Ambulatory Visit (INDEPENDENT_AMBULATORY_CARE_PROVIDER_SITE_OTHER): Payer: No Typology Code available for payment source | Admitting: Family Medicine

## 2023-07-28 VITALS — BP 126/76 | HR 61 | Temp 98.2°F | Wt 153.2 lb

## 2023-07-28 DIAGNOSIS — R059 Cough, unspecified: Secondary | ICD-10-CM | POA: Diagnosis not present

## 2023-07-28 DIAGNOSIS — R52 Pain, unspecified: Secondary | ICD-10-CM | POA: Diagnosis not present

## 2023-07-28 DIAGNOSIS — J189 Pneumonia, unspecified organism: Secondary | ICD-10-CM | POA: Diagnosis not present

## 2023-07-28 DIAGNOSIS — R509 Fever, unspecified: Secondary | ICD-10-CM | POA: Diagnosis not present

## 2023-07-28 MED ORDER — AZITHROMYCIN 250 MG PO TABS: ORAL_TABLET | ORAL | 0 refills | Status: AC

## 2023-07-28 NOTE — Progress Notes (Signed)
BP 126/76   Pulse 61   Temp 98.2 F (36.8 C) (Oral)   Wt 153 lb 3.2 oz (69.5 kg)   LMP  (LMP Unknown)   SpO2 98%   BMI 28.95 kg/m    Subjective:    Patient ID: Teresa [name], female    DOB: 1945/12/30, 77 y.o.   MRN: [id number]  HPI: Teresa [name] is a 77 y.o. female  Chief Complaint  Patient presents with   Cough    Patient says she became symptomatic Thursday. Patient says she runny nose, fever, aching all over. Patient says has tried over the counter DayQuil/Nyquil and 12-hr Cough Relief Syrup. Patient says the worse of it is getting better.   UPPER RESPIRATORY TRACT INFECTION Duration: 4 days Worst symptom: cough Fever: yes Cough: yes Shortness of breath: no Wheezing: yes Chest pain: no Chest tightness: no Chest congestion: yes Nasal congestion: no Runny nose: no Post nasal drip: no Sneezing: no Sore throat: no Swollen glands: no Sinus pressure: no Headache: yes Face pain: no Toothache: no Ear pain: no  Ear pressure: no  Eyes red/itching:no Eye drainage/crusting: no  Vomiting: no Rash: no Fatigue: yes Sick contacts: yes Strep contacts: no  Context: better Recurrent sinusitis: no Relief with OTC cold/cough medications: no  Treatments attempted: nyquil, dayquil   Relevant past medical, surgical, family and social history reviewed and updated as indicated. Interim medical history since our last visit reviewed. Allergies and medications reviewed and updated.  Review of Systems  Constitutional:  Positive for fatigue. Negative for activity change, appetite change, chills, diaphoresis, fever and unexpected weight change.  HENT: Negative.    Respiratory:  Positive for cough, chest tightness and wheezing. Negative for apnea, choking, shortness of breath and stridor.   Cardiovascular: Negative.   Gastrointestinal: Negative.   Musculoskeletal: Negative.   Psychiatric/Behavioral: Negative.      Per HPI unless specifically indicated above      Objective:    BP 126/76   Pulse 61   Temp 98.2 F (36.8 C) (Oral)   Wt 153 lb 3.2 oz (69.5 kg)   LMP  (LMP Unknown)   SpO2 98%   BMI 28.95 kg/m   Wt Readings from Last 3 Encounters:  07/28/23 153 lb 3.2 oz (69.5 kg)  06/20/23 156 lb 3.2 oz (70.9 kg)  04/22/23 158 lb 6.4 oz (71.8 kg)    Physical Exam Vitals and nursing note reviewed.  Constitutional:      General: She is not in acute distress.    Appearance: Normal appearance. She is not ill-appearing, toxic-appearing or diaphoretic.  HENT:     Head: Normocephalic and atraumatic.     Right Ear: Tympanic membrane, ear canal and external ear normal. There is no impacted cerumen.     Left Ear: Tympanic membrane, ear canal and external ear normal. There is no impacted cerumen.     Nose: Rhinorrhea present. No congestion.     Mouth/Throat:     Mouth: Mucous membranes are moist.     Pharynx: Oropharynx is clear. No oropharyngeal exudate or posterior oropharyngeal erythema.  Eyes:     General: No scleral icterus.       Right eye: No discharge.        Left eye: No discharge.     Extraocular Movements: Extraocular movements intact.     Conjunctiva/sclera: Conjunctivae normal.     Pupils: Pupils are equal, round, and reactive to light.  Cardiovascular:     Rate and Rhythm: Normal  rate and regular rhythm.     Pulses: Normal pulses.     Heart sounds: Normal heart sounds. No murmur heard.    No friction rub. No gallop.  Pulmonary:     Effort: Pulmonary effort is normal. No respiratory distress.     Breath sounds: No stridor. Rhonchi (fine rhonchi throught) present. No wheezing or rales.  Chest:     Chest wall: No tenderness.  Musculoskeletal:        General: Normal range of motion.     Cervical back: Normal range of motion and neck supple.  Skin:    General: Skin is warm and dry.     Capillary Refill: Capillary refill takes less than 2 seconds.     Coloration: Skin is not jaundiced or pale.     Findings: No bruising,  erythema, lesion or rash.  Neurological:     General: No focal deficit present.     Mental Status: She is alert and oriented to person, place, and time. Mental status is at baseline.  Psychiatric:        Mood and Affect: Mood normal.        Behavior: Behavior normal.        Thought Content: Thought content normal.        Judgment: Judgment normal.     Results for orders placed or performed in visit on 07/07/23  Iron and TIBC   Collection Time: 07/07/23  1:22 PM  Result Value Ref Range   Iron 77 28 - 170 ug/dL   TIBC 161 096 - 045 ug/dL   Saturation Ratios 29 10.4 - 31.8 %   UIBC 192 ug/dL  Ferritin   Collection Time: 07/07/23  1:22 PM  Result Value Ref Range   Ferritin 150 11 - 307 ng/mL  CBC   Collection Time: 07/07/23  1:22 PM  Result Value Ref Range   WBC 6.1 4.0 - 10.5 K/uL   RBC 3.28 (L) 3.87 - 5.11 MIL/uL   Hemoglobin 10.3 (L) 12.0 - 15.0 g/dL   HCT 40.9 (L) 81.1 - 91.4 %   MCV 96.6 80.0 - 100.0 fL   MCH 31.4 26.0 - 34.0 pg   MCHC 32.5 30.0 - 36.0 g/dL   RDW 78.2 95.6 - 21.3 %   Platelets 214 150 - 400 K/uL   nRBC 0.0 0.0 - 0.2 %      Assessment & Plan:   Problem List Items Addressed This Visit   None Visit Diagnoses       Atypical pneumonia    -  Primary   Will treat with azithromycin. Call with any concerns. Continue to monitor.   Relevant Medications   azithromycin (ZITHROMAX) 250 MG tablet     Cough, unspecified type       Flu and strep negative. Await covid. Exam suggests atypical pneumonia.   Relevant Orders   Novel Coronavirus, NAA ([facility])   Influenza A & B (STAT)   Rapid Strep screen([facility])        Follow up plan: Return if symptoms worsen or fail to improve.

## 2023-07-30 LAB — NOVEL CORONAVIRUS, NAA: SARS-CoV-2, NAA: NOT DETECTED

## 2023-07-31 LAB — VERITOR FLU A/B WAIVED
Influenza A: NEGATIVE
Influenza B: NEGATIVE

## 2023-07-31 LAB — RAPID STREP SCREEN (MED CTR MEBANE ONLY): Strep Gp A Ag, IA W/Reflex: NEGATIVE

## 2023-07-31 LAB — CULTURE, GROUP A STREP

## 2023-08-25 ENCOUNTER — Ambulatory Visit
Admission: RE | Admit: 2023-08-25 | Discharge: 2023-08-25 | Disposition: A | Discharge status: Home / Self Care | Payer: No Typology Code available for payment source | Source: Ambulatory Visit | Attending: Family Medicine | Admitting: Family Medicine

## 2023-08-25 DIAGNOSIS — Z1231 Encounter for screening mammogram for malignant neoplasm of breast: Secondary | ICD-10-CM | POA: Insufficient documentation

## 2023-09-02 DIAGNOSIS — H2513 Age-related nuclear cataract, bilateral: Secondary | ICD-10-CM | POA: Diagnosis not present

## 2023-09-02 DIAGNOSIS — E119 Type 2 diabetes mellitus without complications: Secondary | ICD-10-CM | POA: Diagnosis not present

## 2023-09-02 LAB — HM DIABETES EYE EXAM

## 2023-09-04 ENCOUNTER — Ambulatory Visit: Payer: No Typology Code available for payment source | Attending: Cardiology | Admitting: Cardiology

## 2023-09-04 ENCOUNTER — Encounter: Payer: Self-pay | Admitting: Cardiology

## 2023-09-04 VITALS — BP 138/56 | HR 66 | Ht 62.0 in | Wt 156.2 lb

## 2023-09-04 DIAGNOSIS — R001 Bradycardia, unspecified: Secondary | ICD-10-CM | POA: Diagnosis not present

## 2023-09-04 DIAGNOSIS — N1831 Chronic kidney disease, stage 3a: Secondary | ICD-10-CM

## 2023-09-04 DIAGNOSIS — I639 Cerebral infarction, unspecified: Secondary | ICD-10-CM

## 2023-09-04 DIAGNOSIS — I4719 Other supraventricular tachycardia: Secondary | ICD-10-CM | POA: Diagnosis not present

## 2023-09-04 DIAGNOSIS — I10 Essential (primary) hypertension: Secondary | ICD-10-CM

## 2023-09-04 DIAGNOSIS — E1122 Type 2 diabetes mellitus with diabetic chronic kidney disease: Secondary | ICD-10-CM

## 2023-09-04 DIAGNOSIS — E039 Hypothyroidism, unspecified: Secondary | ICD-10-CM

## 2023-09-04 DIAGNOSIS — E782 Mixed hyperlipidemia: Secondary | ICD-10-CM | POA: Diagnosis not present

## 2023-09-04 DIAGNOSIS — I4729 Other ventricular tachycardia: Secondary | ICD-10-CM | POA: Diagnosis not present

## 2023-09-04 NOTE — Assessment & Plan Note (Signed)
On Synthroid. -Ensure thyroid levels are being regularly monitored by primary care physician.

## 2023-09-04 NOTE — Patient Instructions (Signed)
Medication Instructions:  The current medical regimen is effective;  continue present plan and medications.  *If you need a refill on your cardiac medications before your next appointment, please call your pharmacy*   Follow-Up: At [facility], you and your health needs are our priority.  As part of our continuing mission to provide you with exceptional heart care, we have created designated Provider Care Teams.  These Care Teams include your primary Cardiologist (physician) and Advanced Practice Providers (APPs -  Physician Assistants and Nurse Practitioners) who all work together to provide you with the care you need, when you need it.  We recommend signing up for the patient portal called "MyChart".  Sign up information is provided on this After Visit Summary.  MyChart is used to connect with patients for Virtual Visits (Telemedicine).  Patients are able to view lab/test results, encounter notes, upcoming appointments, etc.  Non-urgent messages can be sent to your provider as well.   To learn more about what you can do with MyChart, go to [email or web address].    Your next appointment:   As needed  Provider:   Bryan Lemma, MD

## 2023-09-04 NOTE — Progress Notes (Signed)
Cardiology Office Note:  .   Date:  09/04/2023  ID:  Teresa [name], DOB 11-14-1945, MRN [id number] PCP: Dorcas Carrow, DO  [facility] Providers Cardiologist:  None     Chief Complaint  Patient presents with   Bradycardia    Patient states that she does get tired and her heart rated has dropped below 40. Patient is doing ok on today. Meds reviewed. Patient states that she had a heart monitor twice, but nothing showed up in the results per patient.     Patient Profile: .     Teresa [name] is a 77 y.o. female with a PMH notable HTN, HLD, DM-2 and per report prior CVA  who presents here for cardiology evaluation of BRADYCARDIA at the request of Kristoff, Megan P, DO.  HTN, HLD, DM-2 and per report prior CVA  She has had cardiac monitors and August 2021 as well as May 2024 showing heart rate ranges from the high 30s to 180s with short bursts of PAT and sinus bradycardia.  But average blood heart rates are usually stable.    Teresa [name] was most recently seen by Dr. Laural Benes on 07/28/23 for Atypical PNA.  Her most recent monitor was back in May 2024-see below  Subjective  Discussed the use of AI scribe software for clinical note transcription with the patient, who gave verbal consent to proceed.  History of Present Illness   The patient, with a history of diabetes, hypertension, and a potential stroke several years ago, presents with concerns about a low resting heart rate. They report that their pulse has been consistently low, around 50 beats per minute, as observed by their primary care physician. However, they deny experiencing any associated symptoms such as dizziness, lightheadedness, or fatigue.  For their diabetes, they are on Metformin 500mg , two tablets twice a day. For hypertension, they take Lisinopril 30mg  and Chlorthalidone 25mg . They also take Rosuvastatin 10mg  for cholesterol management and Synthroid for thyroid function. The patient is also on Plavix,  a blood thinner, which was prescribed after a suspected stroke. They also take aspirin, leading to concerns about excessive bruising and bleeding.  The patient has noticed spontaneous skin bleeding and bruising, which they attribute to the combination of aspirin and Plavix. They express a desire to discontinue one or both of these medications due to these side effects.  The patient has worn a heart monitor twice, which showed their heart rate ranging from 37 to 193 beats per minute. They report no noticeable symptoms during these episodes of fast heart rate. They also report no recent symptoms suggestive of a stroke.  The patient admits to feeling a lack of motivation to start activities but denies any physical inability to perform tasks. They have been taking over-the-counter cold medications, which they believe may be contributing to a slightly elevated blood pressure reading during the current visit.  The patient's most recent cholesterol level was satisfactory, with an LDL of 48. An echocardiogram performed in September 2021 showed normal pump function with an ejection fraction of 55-60%, and no wall motion abnormalities. There was a slight dilation of the left atrium, likely due to their hypertension.  The patient checks their blood pressure and heart rate at home. They report that their heart rate is usually around 42 beats per minute upon waking up. They have not experienced any symptoms of dizziness, lightheadedness, or fainting spells.     Cardiovascular ROS: no chest pain or dyspnea on exertion positive for -  mild exertional fatigue, nothing significant. negative for - edema, irregular heartbeat, orthopnea, palpitations, paroxysmal nocturnal dyspnea, rapid heart rate, shortness of breath, or syncope or near significant TIA or emesis.  No claudication Mild exercise intolerance    Objective   Medications -- Not on AV Nodal Agent - Metformin 500 mg 2 tablets twice a day - Lisinopril 30  mg - Chlorthalidone 25 mg - Rosuvastatin 10 mg - Synthroid 88 mcg - Vitamins - Plavix 75 mg daily - Aspirin 81 mg daily -Folvite 800 mcg (1/2 tab daily)  Studies Reviewed: Marland Kitchen   EKG Interpretation Date/Time:  Thursday September 04 2023 09:15:42 EST Ventricular Rate:  66 PR Interval:  150 QRS Duration:  78 QT Interval:  414 QTC Calculation: 434 R Axis:   -24  Text Interpretation: Normal sinus rhythm Minimal voltage criteria for LVH, may be normal variant ( R in aVL ) with repolarization abnormality When compared with ECG of 26-Sep-2017 12:46, Sinus rhythm has replaced Junctional rhythm Confirmed by Bryan Lemma ([id number]) on 09/04/2023 9:52:14 AM    DIAGNOSTIC EKG: Heart rate 50 bpm (11/2022) Zio Patch Monitor: Heart rate range 37-193 bpm, bursts of fast heart rate, rare skipping beats (11/2022) Echocardiography: Normal pump function, ejection fraction 55-60%, no wall motion abnormality, left atrial dilation (04/2020)  Lab Results  Component Value Date   CHOL 119 11/26/2022   HDL 50 11/26/2022   LDLCALC 48 11/26/2022   TRIG 120 11/26/2022   Risk Assessment/Calculations:             Physical Exam:   VS:  BP (!) 138/56   Pulse 66   Ht 5\' 2"  (1.575 m)   Wt 156 lb 3.2 oz (70.9 kg)   LMP  (LMP Unknown)   SpO2 99%   BMI 28.57 kg/m    Wt Readings from Last 3 Encounters:  09/04/23 156 lb 3.2 oz (70.9 kg)  07/28/23 153 lb 3.2 oz (69.5 kg)  06/20/23 156 lb 3.2 oz (70.9 kg)    GEN: Well nourished, well developed in no acute distress; healthy appearing NECK: No JVD; No carotid bruits CARDIAC: Normal S1, S2; RRR, no murmurs, rubs, gallops RESPIRATORY:  Clear to auscultation without rales, wheezing or rhonchi ; nonlabored, good air movement. ABDOMEN: Soft, non-tender, non-distended EXTREMITIES:  No edema; No deformity      ASSESSMENT AND PLAN: .    Problem List Items Addressed This Visit       Cardiology Problems   Essential hypertension (Chronic)   On lisinopril 30mg   and chlorthalidone 25mg . Blood pressure slightly elevated today, possibly due to over-the-counter cold medication. -Continue current medications. -Monitor blood pressure at home.      Hyperlipidemia (Chronic)   On rosuvastatin 10mg . LDL 48 as of April 2024. -Continue current medication.      PAT (paroxysmal atrial tachycardia) (HCC) (Chronic)   Zio patch showed heart rate range of 37-193.  However the average rate was in the 60s.  Fast heart rates were with short bursts of PAT with the longest being 20 beats.  Asymptomatic.  Lowest heart rate ranges were at nighttime with sleeping and not associated with any symptoms.  Likely benign. -In light of her having bradycardia, would avoid beta-blockers or other AV nodal agents.       Relevant Orders   EKG 12-Lead (Completed)     Other   Bradycardia - Primary (Chronic)   Heart rate in the 30s-40s at rest, with no associated symptoms of dizziness, lightheadedness, or syncope. No medications that  could be contributing to bradycardia. EKG in April 2024 showed heart rate of 50. Holter monitor showed heart rate range of 37-193 with short bursts of tachycardia but nothing sustained. -Continue to monitor symptoms. -Check heart rate during the day with home blood pressure monitor. -Consider consultation with electrophysiologist if heart rate remains below 50 during the day or if symptoms develop.      Cerebral infarction (HCC) (Chronic)   On both aspirin and Plavix, with history of possible stroke several years ago. Experiencing easy bruising and skin bleeding. -Discuss with prescribing physician the possibility of discontinuing one of the anticoagulants.      Hypothyroidism (Chronic)   On Synthroid. -Ensure thyroid levels are being regularly monitored by primary care physician.       Type 2 diabetes mellitus with stage 3 chronic kidney disease, without long-term current use of insulin (HCC) (Chronic)   On metformin 500mg , two tabs twice a  day. -Continue current medication.      Follow-Up: Return for Followup when necessary.    Signed, Marykay Lex, MD, MS Bryan Lemma, M.D., M.S. Interventional Cardiologist  [facility]  Pager # [phone] Phone # [phone] [address] [place], [place] [address]

## 2023-09-04 NOTE — Assessment & Plan Note (Signed)
On both aspirin and Plavix, with history of possible stroke several years ago. Experiencing easy bruising and skin bleeding. -Discuss with prescribing physician the possibility of discontinuing one of the anticoagulants.

## 2023-09-04 NOTE — Assessment & Plan Note (Signed)
On lisinopril 30mg  and chlorthalidone 25mg . Blood pressure slightly elevated today, possibly due to over-the-counter cold medication. -Continue current medications. -Monitor blood pressure at home.

## 2023-09-04 NOTE — Assessment & Plan Note (Signed)
On metformin 500mg , two tabs twice a day. -Continue current medication.

## 2023-09-04 NOTE — Assessment & Plan Note (Signed)
Heart rate in the 30s-40s at rest, with no associated symptoms of dizziness, lightheadedness, or syncope. No medications that could be contributing to bradycardia. EKG in April 2024 showed heart rate of 50. Holter monitor showed heart rate range of 37-193 with short bursts of tachycardia but nothing sustained. -Continue to monitor symptoms. -Check heart rate during the day with home blood pressure monitor. -Consider consultation with electrophysiologist if heart rate remains below 50 during the day or if symptoms develop.

## 2023-09-04 NOTE — Assessment & Plan Note (Signed)
On rosuvastatin 10mg . LDL 48 as of April 2024. -Continue current medication.

## 2023-09-04 NOTE — Assessment & Plan Note (Addendum)
Zio patch showed heart rate range of 37-193.  However the average rate was in the 60s.  Fast heart rates were with short bursts of PAT with the longest being 20 beats.  Asymptomatic.  Lowest heart rate ranges were at nighttime with sleeping and not associated with any symptoms.  Likely benign. -In light of her having bradycardia, would avoid beta-blockers or other AV nodal agents.

## 2023-09-05 ENCOUNTER — Ambulatory Visit: Payer: No Typology Code available for payment source | Admitting: Cardiology

## 2023-09-18 DIAGNOSIS — L659 Nonscarring hair loss, unspecified: Secondary | ICD-10-CM | POA: Diagnosis not present

## 2023-09-22 ENCOUNTER — Telehealth: Payer: Self-pay | Admitting: Family Medicine

## 2023-09-22 ENCOUNTER — Encounter: Payer: Self-pay | Admitting: Family Medicine

## 2023-09-22 NOTE — Telephone Encounter (Signed)
 Copied from CRM [id number]. Topic: Appointment Scheduling - Scheduling Inquiry for Clinic >> Sep 22, 2023  8:55 AM Tiffany B wrote: Reason for CRM:  Patient would like to know if she can have labs done at PCP office.Patient states her Dermatologist gave her lab orders and does not want to go to the [employer] because it was to many people.

## 2023-09-22 NOTE — Telephone Encounter (Signed)
 Needed labs are CMP, Vit D-25, TSH, Free and T4, CBC with Diff, Platelets, Ferratin and antinuclear antibody (ANA) multiplex due to hair loss. She does state she would prefer to hold on completing currently after discussing with her that since some of these labs would be done with her upcoming physical they may not be covered outside of the physical which she prefers to avoid if able.

## 2023-10-03 ENCOUNTER — Ambulatory Visit (INDEPENDENT_AMBULATORY_CARE_PROVIDER_SITE_OTHER): Payer: No Typology Code available for payment source | Admitting: Family Medicine

## 2023-10-03 ENCOUNTER — Encounter: Payer: Self-pay | Admitting: Family Medicine

## 2023-10-03 ENCOUNTER — Ambulatory Visit: Payer: No Typology Code available for payment source | Admitting: Cardiology

## 2023-10-03 VITALS — BP 158/62 | Temp 97.6°F | Ht 62.0 in

## 2023-10-03 DIAGNOSIS — E039 Hypothyroidism, unspecified: Secondary | ICD-10-CM

## 2023-10-03 DIAGNOSIS — I129 Hypertensive chronic kidney disease with stage 1 through stage 4 chronic kidney disease, or unspecified chronic kidney disease: Secondary | ICD-10-CM | POA: Diagnosis not present

## 2023-10-03 DIAGNOSIS — Z0000 Encounter for general adult medical examination without abnormal findings: Secondary | ICD-10-CM | POA: Diagnosis not present

## 2023-10-03 DIAGNOSIS — L659 Nonscarring hair loss, unspecified: Secondary | ICD-10-CM

## 2023-10-03 DIAGNOSIS — I10 Essential (primary) hypertension: Secondary | ICD-10-CM | POA: Diagnosis not present

## 2023-10-03 DIAGNOSIS — D692 Other nonthrombocytopenic purpura: Secondary | ICD-10-CM

## 2023-10-03 DIAGNOSIS — E1122 Type 2 diabetes mellitus with diabetic chronic kidney disease: Secondary | ICD-10-CM | POA: Diagnosis not present

## 2023-10-03 DIAGNOSIS — E782 Mixed hyperlipidemia: Secondary | ICD-10-CM | POA: Diagnosis not present

## 2023-10-03 DIAGNOSIS — L900 Lichen sclerosus et atrophicus: Secondary | ICD-10-CM

## 2023-10-03 DIAGNOSIS — N1831 Chronic kidney disease, stage 3a: Secondary | ICD-10-CM | POA: Diagnosis not present

## 2023-10-03 DIAGNOSIS — E538 Deficiency of other specified B group vitamins: Secondary | ICD-10-CM

## 2023-10-03 LAB — BAYER DCA HB A1C WAIVED: HB A1C (BAYER DCA - WAIVED): 6 % — ABNORMAL HIGH (ref 4.8–5.6)

## 2023-10-03 LAB — MICROALBUMIN, URINE WAIVED
Creatinine, Urine Waived: 100 mg/dL (ref 10–300)
Microalb, Ur Waived: 50 mg/L — ABNORMAL HIGH (ref 0–19)

## 2023-10-03 MED ORDER — CLOBETASOL PROPIONATE 0.05 % EX OINT: TOPICAL_OINTMENT | Freq: Two times a day (BID) | CUTANEOUS | 3 refills | Status: AC

## 2023-10-03 MED ORDER — ROSUVASTATIN CALCIUM 10 MG PO TABS: 10.0000 mg | ORAL_TABLET | Freq: Every day | ORAL | 1 refills | Status: DC

## 2023-10-03 MED ORDER — LISINOPRIL 30 MG PO TABS: 30.0000 mg | ORAL_TABLET | Freq: Every day | ORAL | 1 refills | Status: DC

## 2023-10-03 MED ORDER — CHLORTHALIDONE 25 MG PO TABS: 25.0000 mg | ORAL_TABLET | Freq: Every day | ORAL | 1 refills | Status: DC

## 2023-10-03 MED ORDER — METFORMIN HCL ER 500 MG PO TB24: ORAL_TABLET | ORAL | 1 refills | Status: DC

## 2023-10-03 NOTE — Progress Notes (Signed)
 BP (!) 158/62 (BP Location: Left Arm, Patient Position: Sitting, Cuff Size: Normal)   Temp 97.6 F (36.4 C) (Oral)   Ht 5\' 2"  (1.575 m)   LMP  (LMP Unknown)   SpO2 99%   BMI 28.57 kg/m    Subjective:    Patient ID: Teresa [name], female    DOB: 02/17/1946, 78 y.o.   MRN: [id number]  HPI: Teresa [name] is a 78 y.o. female presenting on 10/03/2023 for comprehensive medical examination. Current medical complaints include:  DIABETES Hypoglycemic episodes:no Polydipsia/polyuria: no Visual disturbance: no Chest pain: no Paresthesias: no Glucose Monitoring: occasionally Taking Insulin?: no Blood Pressure Monitoring: not checking Retinal Examination: Up to Date Foot Exam: Up to Date Diabetic Education: Completed Pneumovax: Up to Date Influenza: Up to Date Aspirin: yes  HYPERTENSION / HYPERLIPIDEMIA Satisfied with current treatment? yes Duration of hypertension: chronic BP monitoring frequency: not checking BP medication side effects: no Past BP meds: lisinopril, chlorthalidone Duration of hyperlipidemia: chronic Cholesterol medication side effects: no Cholesterol supplements: none Past cholesterol medications: crestor Medication compliance: excellent compliance Aspirin: yes Recent stressors: no Recurrent headaches: no Visual changes: no Palpitations: no Dyspnea: no Chest pain: no Lower extremity edema: no Dizzy/lightheaded: no  HYPOTHYROIDISM Thyroid control status:controlled Satisfied with current treatment? yes Medication side effects: no Medication compliance: excellent compliance Recent dose adjustment:no Fatigue: no Cold intolerance: no Heat intolerance: no Weight gain: no Weight loss: no Constipation: no Diarrhea/loose stools: no Palpitations: no Lower extremity edema: no Anxiety/depressed mood: no  Menopausal Symptoms: no  Depression Screen done today and results listed below:     04/22/2023   12:59 PM 03/24/2023    1:28 PM 03/22/2022    11:22 AM 03/20/2022    9:48 AM 03/01/2022    2:02 PM  Depression screen PHQ 2/9  Decreased Interest 0 0 0 0 0  Down, Depressed, Hopeless 0 0 0 0 0  PHQ - 2 Score 0 0 0 0 0  Altered sleeping 0  1 0 0  Tired, decreased energy 0  1 2 2   Change in appetite 0  0 1 0  Feeling bad or failure about yourself  0  0 0 0  Trouble concentrating 0  0 0 0  Moving slowly or fidgety/restless 0  0 0 0  Suicidal thoughts 0  0 0 0  PHQ-9 Score 0  2 3 2   Difficult doing work/chores Not difficult at all  Not difficult at all Not difficult at all Not difficult at all     Past Medical History:  Past Medical History:  Diagnosis Date   Diabetes mellitus without complication (HCC)    Heart murmur    Hyperlipidemia    Hypertension    Hypothyroidism    Lichen sclerosus    Obesity    Osteopenia    Stroke [facility])    Thyroid disease     Surgical History:  Past Surgical History:  Procedure Laterality Date   carpel tunnel     COLONOSCOPY WITH PROPOFOL N/A 08/30/2020   Procedure: COLONOSCOPY WITH PROPOFOL;  Surgeon: Pasty Spillers, MD;  Location: [facility] ENDOSCOPY;  Service: Gastroenterology;  Laterality: N/A;   ROTATOR CUFF REPAIR Left    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      Medications:  Current Outpatient Medications on File Prior to Visit  Medication Sig   ASPIRIN 81 PO Take by mouth daily.   blood glucose meter kit and supplies Dispense based on patient and insurance preference. Use up to four  times daily as directed. (FOR ICD-9 250.00, 250.01).   Blood Glucose Monitoring Suppl (ONETOUCH VERIO) w/Device KIT 1 each by Does not apply route daily.   folic acid (FOLVITE) 800 MCG tablet Take 400 mcg by mouth daily.   glucose blood (ONETOUCH VERIO) test strip Use as instructed   glucose blood test strip 1 each by Other route 2 (two) times daily. Use as instructed, Dx: E11.22   levothyroxine (SYNTHROID) 50 MCG tablet Take 1 tablet (50 mcg total) by mouth daily.   ondansetron (ZOFRAN) 4 MG tablet Take 1  tablet (4 mg total) by mouth every 8 (eight) hours as needed for nausea or vomiting.   OneTouch Delica Lancets 33G MISC 1 each by Does not apply route daily.   valACYclovir (VALTREX) 1000 MG tablet Take 1,000 mg by mouth 3 (three) times daily.   vitamin B-12 (CYANOCOBALAMIN) 1000 MCG tablet Take 1,000 mcg by mouth daily.   Current Facility-Administered Medications on File Prior to Visit  Medication   cyanocobalamin (VITAMIN B12) injection 1,000 mcg    Allergies:  Allergies  Allergen Reactions   Amlodipine Other (See Comments)    Ankle swelling   Atorvastatin     cramps   Evista [Raloxifene] Nausea Only    Social History:  Social History   Socioeconomic History   Marital status: Married    Spouse name: Molly Maduro   Number of children: 1   Years of education: Not on file   Highest education level: High school graduate  Occupational History   Occupation: retired  Tobacco Use   Smoking status: Former    Current packs/day: 0.00    Average packs/day: 0.3 packs/day for 2.4 years (0.6 ttl pk-yrs)    Types: Cigarettes    Start date: 1964    Quit date: 01/1965    Years since quitting: 58.7   Smokeless tobacco: Never  Vaping Use   Vaping status: Never Used  Substance and Sexual Activity   Alcohol use: No    Alcohol/week: 0.0 standard drinks of alcohol   Drug use: No   Sexual activity: Never  Other Topics Concern   Not on file  Social History Narrative   Not on file   Social Drivers of Health   Financial Resource Strain: Low Risk  (04/22/2023)   Overall Financial Resource Strain (CARDIA)    Difficulty of Paying Living Expenses: Not hard at all  Food Insecurity: No Food Insecurity (04/22/2023)   Hunger Vital Sign    Worried About Running Out of Food in the Last Year: Never true    Ran Out of Food in the Last Year: Never true  Transportation Needs: No Transportation Needs (04/22/2023)   PRAPARE - Administrator, Civil Service (Medical): No    Lack of  Transportation (Non-Medical): No  Physical Activity: Inactive (04/22/2023)   Exercise Vital Sign    Days of Exercise per Week: 0 days    Minutes of Exercise per Session: 0 min  Stress: No Stress Concern Present (04/22/2023)   [employer] of Occupational Health - Occupational Stress Questionnaire    Feeling of Stress : Not at all  Social Connections: Moderately Integrated (04/22/2023)   Social Connection and Isolation Panel [NHANES]    Frequency of Communication with Friends and Family: More than three times a week    Frequency of Social Gatherings with Friends and Family: Three times a week    Attends Religious Services: More than 4 times per year    Active Member of  Clubs or Organizations: No    Attends Banker Meetings: Never    Marital Status: Married  Catering manager Violence: Not At Risk (04/22/2023)   Humiliation, Afraid, Rape, and Kick questionnaire    Fear of Current or Ex-Partner: No    Emotionally Abused: No    Physically Abused: No    Sexually Abused: No   Social History   Tobacco Use  Smoking Status Former   Current packs/day: 0.00   Average packs/day: 0.3 packs/day for 2.4 years (0.6 ttl pk-yrs)   Types: Cigarettes   Start date: 1964   Quit date: 01/1965   Years since quitting: 58.7  Smokeless Tobacco Never   Social History   Substance and Sexual Activity  Alcohol Use No   Alcohol/week: 0.0 standard drinks of alcohol    Family History:  Family History  Problem Relation Age of Onset   Hypertension Mother    Thyroid disease Mother    Glaucoma Mother    Coronary artery disease Mother    Stroke Father    Diabetes Brother    Hypertension Maternal Grandfather    Breast cancer Neg Hx     Past medical history, surgical history, medications, allergies, family history and social history reviewed with patient today and changes made to appropriate areas of the chart.   Review of Systems  Constitutional: Negative.   HENT: Negative.          Sneezing  Eyes: Negative.   Respiratory: Negative.    Cardiovascular: Negative.   Gastrointestinal:  Positive for constipation. Negative for abdominal pain, blood in stool, diarrhea, heartburn, melena, nausea and vomiting.  Genitourinary:  Positive for dysuria and frequency. Negative for flank pain, hematuria and urgency.  Skin: Negative.   Neurological: Negative.   Endo/Heme/Allergies:  Negative for environmental allergies and polydipsia. Bruises/bleeds easily.  Psychiatric/Behavioral: Negative.     All other ROS negative except what is listed above and in the HPI.      Objective:    BP (!) 158/62 (BP Location: Left Arm, Patient Position: Sitting, Cuff Size: Normal)   Temp 97.6 F (36.4 C) (Oral)   Ht 5\' 2"  (1.575 m)   LMP  (LMP Unknown)   SpO2 99%   BMI 28.57 kg/m   Wt Readings from Last 3 Encounters:  09/04/23 156 lb 3.2 oz (70.9 kg)  07/28/23 153 lb 3.2 oz (69.5 kg)  06/20/23 156 lb 3.2 oz (70.9 kg)    Physical Exam Vitals and nursing note reviewed.  Constitutional:      General: She is not in acute distress.    Appearance: Normal appearance. She is not ill-appearing, toxic-appearing or diaphoretic.  HENT:     Head: Normocephalic and atraumatic.     Right Ear: Tympanic membrane, ear canal and external ear normal. There is no impacted cerumen.     Left Ear: Tympanic membrane, ear canal and external ear normal. There is no impacted cerumen.     Nose: Nose normal. No congestion or rhinorrhea.     Mouth/Throat:     Mouth: Mucous membranes are moist.     Pharynx: Oropharynx is clear. No oropharyngeal exudate or posterior oropharyngeal erythema.  Eyes:     General: No scleral icterus.       Right eye: No discharge.        Left eye: No discharge.     Extraocular Movements: Extraocular movements intact.     Conjunctiva/sclera: Conjunctivae normal.     Pupils: Pupils are equal, round, and reactive  to light.  Neck:     Vascular: No carotid bruit.  Cardiovascular:      Rate and Rhythm: Normal rate and regular rhythm.     Pulses: Normal pulses.     Heart sounds: No murmur heard.    No friction rub. No gallop.  Pulmonary:     Effort: Pulmonary effort is normal. No respiratory distress.     Breath sounds: Normal breath sounds. No stridor. No wheezing, rhonchi or rales.  Chest:     Chest wall: No tenderness.  Abdominal:     General: Abdomen is flat. Bowel sounds are normal. There is no distension.     Palpations: Abdomen is soft. There is no mass.     Tenderness: There is no abdominal tenderness. There is no right CVA tenderness, left CVA tenderness, guarding or rebound.     Hernia: No hernia is present.  Genitourinary:    Comments: Breast and pelvic exams deferred with shared decision making Musculoskeletal:        General: No swelling, tenderness, deformity or signs of injury.     Cervical back: Normal range of motion and neck supple. No rigidity. No muscular tenderness.     Right lower leg: No edema.     Left lower leg: No edema.  Lymphadenopathy:     Cervical: No cervical adenopathy.  Skin:    General: Skin is warm and dry.     Capillary Refill: Capillary refill takes less than 2 seconds.     Coloration: Skin is not jaundiced or pale.     Findings: No bruising, erythema, lesion or rash.  Neurological:     General: No focal deficit present.     Mental Status: She is alert and oriented to person, place, and time. Mental status is at baseline.     Cranial Nerves: No cranial nerve deficit.     Sensory: No sensory deficit.     Motor: No weakness.     Coordination: Coordination normal.     Gait: Gait normal.     Deep Tendon Reflexes: Reflexes normal.  Psychiatric:        Mood and Affect: Mood normal.        Behavior: Behavior normal.        Thought Content: Thought content normal.        Judgment: Judgment normal.     Results for orders placed or performed in visit on 10/03/23  Microalbumin, Urine Waived   Collection Time: 10/03/23  9:00 AM   Result Value Ref Range   Microalb, Ur Waived 50 (H) 0 - 19 mg/L   Creatinine, Urine Waived 100 10 - 300 mg/dL   Microalb/Creat Ratio 30-300 (H) <30 mg/g  Bayer DCA Hb A1c Waived   Collection Time: 10/03/23  9:00 AM  Result Value Ref Range   HB A1C (BAYER DCA - WAIVED) 6.0 (H) 4.8 - 5.6 %  CBC with Differential/Platelet   Collection Time: 10/03/23  9:01 AM  Result Value Ref Range   WBC 6.9 3.4 - 10.8 x10E3/uL   RBC 3.38 (L) 3.77 - 5.28 x10E6/uL   Hemoglobin 10.6 (L) 11.1 - 15.9 g/dL   Hematocrit 56.2 (L) 13.0 - 46.6 %   MCV 95 79 - 97 fL   MCH 31.4 26.6 - 33.0 pg   MCHC 33.0 31.5 - 35.7 g/dL   RDW 86.5 78.4 - 69.6 %   Platelets 197 150 - 450 x10E3/uL   Neutrophils 61 Not Estab. %   Lymphs 29 Not  Estab. %   Monocytes 8 Not Estab. %   Eos 2 Not Estab. %   Basos 0 Not Estab. %   Neutrophils Absolute 4.2 1.4 - 7.0 x10E3/uL   Lymphocytes Absolute 2.0 0.7 - 3.1 x10E3/uL   Monocytes Absolute 0.5 0.1 - 0.9 x10E3/uL   EOS (ABSOLUTE) 0.2 0.0 - 0.4 x10E3/uL   Basophils Absolute 0.0 0.0 - 0.2 x10E3/uL   Immature Granulocytes 0 Not Estab. %   Immature Grans (Abs) 0.0 0.0 - 0.1 x10E3/uL  Comprehensive metabolic panel   Collection Time: 10/03/23  9:01 AM  Result Value Ref Range   Glucose 127 (H) 70 - 99 mg/dL   BUN 22 8 - 27 mg/dL   Creatinine, Ser 6.06 (H) 0.57 - 1.00 mg/dL   eGFR 53 (L) >30 ZS/WFU/9.32   BUN/Creatinine Ratio 20 12 - 28   Sodium 145 (H) 134 - 144 mmol/L   Potassium 4.9 3.5 - 5.2 mmol/L   Chloride 108 (H) 96 - 106 mmol/L   CO2 22 20 - 29 mmol/L   Calcium 9.6 8.7 - 10.3 mg/dL   Total Protein 6.8 6.0 - 8.5 g/dL   Albumin 4.3 3.8 - 4.8 g/dL   Globulin, Total 2.5 1.5 - 4.5 g/dL   Bilirubin Total <3.5 0.0 - 1.2 mg/dL   Alkaline Phosphatase 84 44 - 121 IU/L   AST 15 0 - 40 IU/L   ALT 14 0 - 32 IU/L  Lipid Panel w/o Chol/HDL Ratio   Collection Time: 10/03/23  9:01 AM  Result Value Ref Range   Cholesterol, Total 135 100 - 199 mg/dL   Triglycerides 95 0 - 149 mg/dL    HDL 58 >57 mg/dL   VLDL Cholesterol Cal 18 5 - 40 mg/dL   LDL Chol Calc (NIH) 59 0 - 99 mg/dL  D22   Collection Time: 10/03/23  9:01 AM  Result Value Ref Range   Vitamin B-12 >2000 (H) 232 - 1245 pg/mL  Ferritin   Collection Time: 10/03/23  9:14 AM  Result Value Ref Range   Ferritin 217 (H) 15 - 150 ng/mL  Thyroid Panel With TSH   Collection Time: 10/03/23  9:14 AM  Result Value Ref Range   TSH 2.210 0.450 - 4.500 uIU/mL   T4, Total 9.2 4.5 - 12.0 ug/dL   T3 Uptake Ratio 29 24 - 39 %   Free Thyroxine Index 2.7 1.2 - 4.9  Antinuclear Antib (ANA)   Collection Time: 10/03/23  9:14 AM  Result Value Ref Range   Anti Nuclear Antibody (ANA) Negative Negative      Assessment & Plan:   Problem List Items Addressed This Visit       Cardiovascular and Mediastinum   Essential hypertension (Chronic)   Running high. Will work on [employer] and monitor at home. Recheck in 4 weeks- if still running high will adjust medication.       Relevant Medications   chlorthalidone (HYGROTON) 25 MG tablet   lisinopril (ZESTRIL) 30 MG tablet   rosuvastatin (CRESTOR) 10 MG tablet   Senile purpura (HCC)   Reassured patient. Continue to monitor. Call with any concerns.       Relevant Medications   chlorthalidone (HYGROTON) 25 MG tablet   lisinopril (ZESTRIL) 30 MG tablet   rosuvastatin (CRESTOR) 10 MG tablet     Endocrine   Hypothyroidism (Chronic)   Rechecking labs today. Await results. Treat as needed.       Relevant Orders   CBC with Differential/Platelet (Completed)  Comprehensive metabolic panel (Completed)   Thyroid Panel With TSH (Completed)   Type 2 diabetes mellitus with stage 3 chronic kidney disease, without long-term current use of insulin (HCC) (Chronic)   Doing great with A1c of 6.0. Continue current regimen. Continue to monitor. Call with any concerns.       Relevant Medications   lisinopril (ZESTRIL) 30 MG tablet   metFORMIN (GLUCOPHAGE-XR) 500 MG 24 hr tablet    rosuvastatin (CRESTOR) 10 MG tablet   Other Relevant Orders   Microalbumin, Urine Waived (Completed)   Bayer DCA Hb A1c Waived (Completed)   CBC with Differential/Platelet (Completed)   Comprehensive metabolic panel (Completed)   Lipid Panel w/o Chol/HDL Ratio (Completed)     Genitourinary   Hypertensive CKD (chronic kidney disease)   Running high. Will work on [employer] and monitor at home. Recheck in 4 weeks- if still running high will adjust medication.       Relevant Medications   lisinopril (ZESTRIL) 30 MG tablet   Other Relevant Orders   CBC with Differential/Platelet (Completed)   Comprehensive metabolic panel (Completed)     Other   Hyperlipidemia (Chronic)   Under good control on current regimen. Continue current regimen. Continue to monitor. Call with any concerns. Refills given. Labs drawn today.        Relevant Medications   chlorthalidone (HYGROTON) 25 MG tablet   lisinopril (ZESTRIL) 30 MG tablet   rosuvastatin (CRESTOR) 10 MG tablet   Lichen sclerosus   Refill of clobetasol sent in today. Discussed referral back to GYN- she would like to hold off on that for now. Continue to monitor. Call with any concerns.       B12 deficiency   Under good control on current regimen. Continue current regimen. Continue to monitor. Call with any concerns. Refills given. Labs drawn today.        Relevant Orders   CBC with Differential/Platelet (Completed)   Comprehensive metabolic panel (Completed)   B12 (Completed)   Other Visit Diagnoses       Routine general medical examination at a health care facility    -  Primary   Vaccines up to date. Screening labs checked today. Mammo and DEXA up to date. Continue diet and exercise. Call with any concerns. Continue to monitor.     Hair loss       Following with dermatology. Will check labs. Await results.   Relevant Orders   Ferritin (Completed)   Antinuclear Antib (ANA) (Completed)        Follow up plan: Return in about  4 weeks (around 10/31/2023).   LABORATORY TESTING:  - Pap smear: not applicable  IMMUNIZATIONS:   - Tdap: Tetanus vaccination status reviewed: last tetanus booster within 10 years. - Influenza: Up to date - Pneumovax: Up to date - Prevnar: Up to date - COVID: Refused - HPV: Not applicable - Shingrix vaccine: Refused  SCREENING: -Mammogram: Up to date  - Colonoscopy: Up to date  - Bone Density: Up to date   PATIENT COUNSELING:   Advised to take 1 mg of folate supplement per day if capable of pregnancy.   Sexuality: Discussed sexually transmitted diseases, partner selection, use of condoms, avoidance of unintended pregnancy  and contraceptive alternatives.   Advised to avoid cigarette smoking.  I discussed with the patient that most people either abstain from alcohol or drink within safe limits (<=14/week and <=4 drinks/occasion for males, <=7/weeks and <= 3 drinks/occasion for females) and that the risk for alcohol  disorders and other health effects rises proportionally with the number of drinks per week and how often a drinker exceeds daily limits.  Discussed cessation/primary prevention of drug use and availability of treatment for abuse.   Diet: Encouraged to adjust caloric intake to maintain  or achieve ideal body weight, to reduce intake of dietary saturated fat and total fat, to limit sodium intake by avoiding high sodium foods and not adding table salt, and to maintain adequate dietary potassium and calcium preferably from fresh fruits, vegetables, and low-fat dairy products.    stressed the importance of regular exercise  Injury prevention: Discussed safety belts, safety helmets, smoke detector, smoking near bedding or upholstery.   Dental health: Discussed importance of regular tooth brushing, flossing, and dental visits.    NEXT PREVENTATIVE PHYSICAL DUE IN 1 YEAR. Return in about 4 weeks (around 10/31/2023).

## 2023-10-04 LAB — LIPID PANEL W/O CHOL/HDL RATIO
Cholesterol, Total: 135 mg/dL (ref 100–199)
HDL: 58 mg/dL (ref >39)
LDL Chol Calc (NIH): 59 mg/dL (ref 0–99)
Triglycerides: 95 mg/dL (ref 0–149)
VLDL Cholesterol Cal: 18 mg/dL (ref 5–40)

## 2023-10-04 LAB — CBC WITH DIFFERENTIAL/PLATELET
Basophils Absolute: 0 10*3/uL (ref 0.0–0.2)
Basos: 0 %
EOS (ABSOLUTE): 0.2 10*3/uL (ref 0.0–0.4)
Eos: 2 %
Hematocrit: 32.1 % — ABNORMAL LOW (ref 34.0–46.6)
Hemoglobin: 10.6 g/dL — ABNORMAL LOW (ref 11.1–15.9)
Immature Grans (Abs): 0 10*3/uL (ref 0.0–0.1)
Immature Granulocytes: 0 %
Lymphocytes Absolute: 2 10*3/uL (ref 0.7–3.1)
Lymphs: 29 %
MCH: 31.4 pg (ref 26.6–33.0)
MCHC: 33 g/dL (ref 31.5–35.7)
MCV: 95 fL (ref 79–97)
Monocytes Absolute: 0.5 10*3/uL (ref 0.1–0.9)
Monocytes: 8 %
Neutrophils Absolute: 4.2 10*3/uL (ref 1.4–7.0)
Neutrophils: 61 %
Platelets: 197 10*3/uL (ref 150–450)
RBC: 3.38 x10E6/uL — ABNORMAL LOW (ref 3.77–5.28)
RDW: 12.3 % (ref 11.7–15.4)
WBC: 6.9 10*3/uL (ref 3.4–10.8)

## 2023-10-04 LAB — COMPREHENSIVE METABOLIC PANEL
ALT: 14 IU/L (ref 0–32)
AST: 15 IU/L (ref 0–40)
Albumin: 4.3 g/dL (ref 3.8–4.8)
Alkaline Phosphatase: 84 IU/L (ref 44–121)
BUN/Creatinine Ratio: 20 (ref 12–28)
BUN: 22 mg/dL (ref 8–27)
Bilirubin Total: <0.2 mg/dL (ref 0.0–1.2)
CO2: 22 mmol/L (ref 20–29)
Calcium: 9.6 mg/dL (ref 8.7–10.3)
Chloride: 108 mmol/L — ABNORMAL HIGH (ref 96–106)
Creatinine, Ser: 1.08 mg/dL — ABNORMAL HIGH (ref 0.57–1.00)
Globulin, Total: 2.5 g/dL (ref 1.5–4.5)
Glucose: 127 mg/dL — ABNORMAL HIGH (ref 70–99)
Potassium: 4.9 mmol/L (ref 3.5–5.2)
Sodium: 145 mmol/L — ABNORMAL HIGH (ref 134–144)
Total Protein: 6.8 g/dL (ref 6.0–8.5)
eGFR: 53 mL/min/{1.73_m2} — ABNORMAL LOW (ref >59)

## 2023-10-04 LAB — VITAMIN B12: Vitamin B-12: >2000 pg/mL — ABNORMAL HIGH (ref 232–1245)

## 2023-10-05 ENCOUNTER — Encounter: Payer: Self-pay | Admitting: Family Medicine

## 2023-10-05 ENCOUNTER — Other Ambulatory Visit: Payer: Self-pay | Admitting: Family Medicine

## 2023-10-05 LAB — THYROID PANEL WITH TSH
Free Thyroxine Index: 2.7 (ref 1.2–4.9)
T3 Uptake Ratio: 29 % (ref 24–39)
T4, Total: 9.2 ug/dL (ref 4.5–12.0)
TSH: 2.21 u[IU]/mL (ref 0.450–4.500)

## 2023-10-05 LAB — FERRITIN: Ferritin: 217 ng/mL — ABNORMAL HIGH (ref 15–150)

## 2023-10-05 LAB — ANA

## 2023-10-05 MED ORDER — IRON (FERROUS SULFATE) 325 (65 FE) MG PO TABS: 325.0000 mg | ORAL_TABLET | Freq: Every day | ORAL | 2 refills | Status: DC

## 2023-10-05 NOTE — Assessment & Plan Note (Signed)
 Rechecking labs today. Await results. Treat as needed.

## 2023-10-05 NOTE — Assessment & Plan Note (Signed)
 Doing great with A1c of 6.0. Continue current regimen. Continue to monitor. Call with any concerns.

## 2023-10-05 NOTE — Assessment & Plan Note (Signed)
 Refill of clobetasol sent in today. Discussed referral back to GYN- she would like to hold off on that for now. Continue to monitor. Call with any concerns.

## 2023-10-05 NOTE — Assessment & Plan Note (Signed)
 Reassured patient. Continue to monitor. Call with any concerns.

## 2023-10-05 NOTE — Assessment & Plan Note (Signed)
 Running high. Will work on [employer] and monitor at home. Recheck in 4 weeks- if still running high will adjust medication.

## 2023-10-05 NOTE — Assessment & Plan Note (Signed)
 Under good control on current regimen. Continue current regimen. Continue to monitor. Call with any concerns. Refills given. Labs drawn today.

## 2023-10-06 ENCOUNTER — Encounter: Payer: Self-pay | Admitting: Family Medicine

## 2023-11-03 ENCOUNTER — Encounter: Payer: Self-pay | Admitting: Family Medicine

## 2023-11-03 ENCOUNTER — Ambulatory Visit (INDEPENDENT_AMBULATORY_CARE_PROVIDER_SITE_OTHER): Payer: No Typology Code available for payment source | Admitting: Family Medicine

## 2023-11-03 VITALS — BP 138/80 | HR 79 | Temp 98.7°F | Resp 16 | Ht 62.01 in | Wt 154.0 lb

## 2023-11-03 DIAGNOSIS — I129 Hypertensive chronic kidney disease with stage 1 through stage 4 chronic kidney disease, or unspecified chronic kidney disease: Secondary | ICD-10-CM

## 2023-11-03 DIAGNOSIS — N1831 Chronic kidney disease, stage 3a: Secondary | ICD-10-CM | POA: Diagnosis not present

## 2023-11-03 NOTE — Progress Notes (Signed)
 BP 138/80 (BP Location: Left Arm, Patient Position: Sitting, Cuff Size: Large) Comment: manual  Pulse 79   Temp 98.7 F (37.1 C) (Oral)   Resp 16   Ht 5' 2.01" (1.575 m)   Wt 154 lb (69.9 kg)   LMP  (LMP Unknown)   SpO2 100%   BMI 28.16 kg/m    Subjective:    Patient ID: Teresa [name], female    DOB: 08/08/1946, 78 y.o.   MRN: [id number]  HPI: Teresa [name] is a 78 y.o. female  Chief Complaint  Patient presents with   Hypertension    Blood pressure check. Most the time ranging in the 120's.    HYPERTENSION  Hypertension status: better  Satisfied with current treatment? yes Duration of hypertension: chronic BP monitoring frequency:  a few times a week BP range: 120s systolic BP medication side effects:  no Medication compliance: excellent compliance Previous BP meds: lisinopril, chlorthalidone Aspirin: yes Recurrent headaches: no Visual changes: no Palpitations: no Dyspnea: no Chest pain: no Lower extremity edema: no Dizzy/lightheaded: no  Relevant past medical, surgical, family and social history reviewed and updated as indicated. Interim medical history since our last visit reviewed. Allergies and medications reviewed and updated.  Review of Systems  Constitutional: Negative.   Respiratory: Negative.    Cardiovascular: Negative.   Musculoskeletal: Negative.   Psychiatric/Behavioral: Negative.      Per HPI unless specifically indicated above     Objective:    BP 138/80 (BP Location: Left Arm, Patient Position: Sitting, Cuff Size: Large) Comment: manual  Pulse 79   Temp 98.7 F (37.1 C) (Oral)   Resp 16   Ht 5' 2.01" (1.575 m)   Wt 154 lb (69.9 kg)   LMP  (LMP Unknown)   SpO2 100%   BMI 28.16 kg/m   Wt Readings from Last 3 Encounters:  11/03/23 154 lb (69.9 kg)  09/04/23 156 lb 3.2 oz (70.9 kg)  07/28/23 153 lb 3.2 oz (69.5 kg)    Physical Exam Vitals and nursing note reviewed.  Constitutional:      General: She is not in acute  distress.    Appearance: Normal appearance. She is not ill-appearing, toxic-appearing or diaphoretic.  HENT:     Head: Normocephalic and atraumatic.     Right Ear: External ear normal.     Left Ear: External ear normal.     Nose: Nose normal.     Mouth/Throat:     Mouth: Mucous membranes are moist.     Pharynx: Oropharynx is clear.  Eyes:     General: No scleral icterus.       Right eye: No discharge.        Left eye: No discharge.     Extraocular Movements: Extraocular movements intact.     Conjunctiva/sclera: Conjunctivae normal.     Pupils: Pupils are equal, round, and reactive to light.  Cardiovascular:     Rate and Rhythm: Normal rate and regular rhythm.     Pulses: Normal pulses.     Heart sounds: Normal heart sounds. No murmur heard.    No friction rub. No gallop.  Pulmonary:     Effort: Pulmonary effort is normal. No respiratory distress.     Breath sounds: Normal breath sounds. No stridor. No wheezing, rhonchi or rales.  Chest:     Chest wall: No tenderness.  Musculoskeletal:        General: Normal range of motion.     Cervical back: Normal range  of motion and neck supple.  Skin:    General: Skin is warm and dry.     Capillary Refill: Capillary refill takes less than 2 seconds.     Coloration: Skin is not jaundiced or pale.     Findings: No bruising, erythema, lesion or rash.  Neurological:     General: No focal deficit present.     Mental Status: She is alert and oriented to person, place, and time. Mental status is at baseline.  Psychiatric:        Mood and Affect: Mood normal.        Behavior: Behavior normal.        Thought Content: Thought content normal.        Judgment: Judgment normal.     Results for orders placed or performed in visit on 10/06/23  HM DIABETES EYE EXAM   Collection Time: 09/02/23 10:06 AM  Result Value Ref Range   HM Diabetic Eye Exam No Retinopathy No Retinopathy      Assessment & Plan:   Problem List Items Addressed This  Visit       Genitourinary   Hypertensive CKD (chronic kidney disease) - Primary   Much better. Will continue current regimen. Continue to monitor. Call with any concerns.         Follow up plan: Return in about 5 months (around 04/04/2024).

## 2023-11-03 NOTE — Assessment & Plan Note (Signed)
 Much better. Will continue current regimen. Continue to monitor. Call with any concerns.

## 2023-11-04 ENCOUNTER — Other Ambulatory Visit: Payer: Self-pay | Admitting: Family Medicine

## 2023-11-05 NOTE — Telephone Encounter (Signed)
 No longer listed on current medication list Requested Prescriptions  Pending Prescriptions Disp Refills   clopidogrel (PLAVIX) 75 MG tablet [Pharmacy Med Name: CLOPIDOGREL  TAB 75MG ] 90 tablet 0    Sig: TAKE 1 TABLET DAILY     Hematology: Antiplatelets - clopidogrel Failed - 11/05/2023 12:16 PM      Failed - HCT in normal range and within 180 days    Hematocrit  Date Value Ref Range Status  10/03/2023 32.1 (L) 34.0 - 46.6 % Final         Failed - HGB in normal range and within 180 days    Hemoglobin  Date Value Ref Range Status  10/03/2023 10.6 (L) 11.1 - 15.9 g/dL Final         Failed - Cr in normal range and within 360 days    Creatinine, Ser  Date Value Ref Range Status  10/03/2023 1.08 (H) 0.57 - 1.00 mg/dL Final         Passed - PLT in normal range and within 180 days    Platelets  Date Value Ref Range Status  10/03/2023 197 150 - 450 x10E3/uL Final         Passed - Valid encounter within last 6 months    Recent Outpatient Visits           3 months ago Atypical pneumonia   [place] [facility] [place], Megan P, DO   4 months ago Type 2 diabetes mellitus with stage 3a chronic kidney disease, without long-term current use of insulin (HCC)   [place] [facility] [place], Megan P, DO   7 months ago Encounter for [employer] annual wellness exam   [place] [facility] [place], Megan P, DO   7 months ago Hordeolum externum of right lower eyelid   [place] [facility] [place], Megan P, DO   10 months ago Type 2 diabetes mellitus with stage 3a chronic kidney disease, without long-term current use of insulin [facility])   [place] [facility] [place], [place], DO

## 2023-11-07 ENCOUNTER — Other Ambulatory Visit: Payer: Self-pay

## 2023-11-07 DIAGNOSIS — D638 Anemia in other chronic diseases classified elsewhere: Secondary | ICD-10-CM

## 2023-11-10 ENCOUNTER — Encounter: Payer: Self-pay | Admitting: Oncology

## 2023-11-10 ENCOUNTER — Inpatient Hospital Stay: Payer: No Typology Code available for payment source | Attending: Oncology

## 2023-11-10 ENCOUNTER — Inpatient Hospital Stay: Payer: No Typology Code available for payment source | Admitting: Oncology

## 2023-11-23 ENCOUNTER — Other Ambulatory Visit: Payer: Self-pay | Admitting: Family Medicine

## 2023-11-24 NOTE — Telephone Encounter (Signed)
 Requested Prescriptions  Pending Prescriptions Disp Refills   levothyroxine (SYNTHROID) 50 MCG tablet [Pharmacy Med Name: SYNTHROID TAB 50MCG] 90 tablet 2    Sig: TAKE 1 TABLET DAILY     Endocrinology:  Hypothyroid Agents Passed - 11/24/2023  5:40 PM      Passed - TSH in normal range and within 360 days    TSH  Date Value Ref Range Status  10/03/2023 2.210 0.450 - 4.500 uIU/mL Final         Passed - Valid encounter within last 12 months    Recent Outpatient Visits           3 weeks ago 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)   [place] [facility] [place], Megan P, DO   1 month ago Routine general medical examination at a health care facility   [facility], Megan P, DO

## 2023-11-26 ENCOUNTER — Inpatient Hospital Stay: Admitting: Oncology

## 2023-11-26 ENCOUNTER — Inpatient Hospital Stay: Attending: Oncology

## 2023-11-26 ENCOUNTER — Encounter: Payer: Self-pay | Admitting: Oncology

## 2023-11-26 VITALS — BP 127/67 | HR 57 | Temp 96.1°F | Resp 18 | Ht 62.0 in | Wt 156.9 lb

## 2023-11-26 DIAGNOSIS — D638 Anemia in other chronic diseases classified elsewhere: Secondary | ICD-10-CM

## 2023-11-26 DIAGNOSIS — D508 Other iron deficiency anemias: Secondary | ICD-10-CM

## 2023-11-26 DIAGNOSIS — E611 Iron deficiency: Secondary | ICD-10-CM | POA: Diagnosis not present

## 2023-11-26 DIAGNOSIS — D649 Anemia, unspecified: Secondary | ICD-10-CM | POA: Diagnosis not present

## 2023-11-26 DIAGNOSIS — Z862 Personal history of diseases of the blood and blood-forming organs and certain disorders involving the immune mechanism: Secondary | ICD-10-CM | POA: Insufficient documentation

## 2023-11-26 DIAGNOSIS — Z79899 Other long term (current) drug therapy: Secondary | ICD-10-CM | POA: Diagnosis not present

## 2023-11-26 LAB — CBC WITH DIFFERENTIAL (CANCER CENTER ONLY)
Abs Immature Granulocytes: 0.02 10*3/uL (ref 0.00–0.07)
Basophils Absolute: 0 10*3/uL (ref 0.0–0.1)
Basophils Relative: 1 %
Eosinophils Absolute: 0.2 10*3/uL (ref 0.0–0.5)
Eosinophils Relative: 2 %
HCT: 34.5 % — ABNORMAL LOW (ref 36.0–46.0)
Hemoglobin: 11.3 g/dL — ABNORMAL LOW (ref 12.0–15.0)
Immature Granulocytes: 0 %
Lymphocytes Relative: 35 %
Lymphs Abs: 2.3 10*3/uL (ref 0.7–4.0)
MCH: 31.7 pg (ref 26.0–34.0)
MCHC: 32.8 g/dL (ref 30.0–36.0)
MCV: 96.6 fL (ref 80.0–100.0)
Monocytes Absolute: 0.5 10*3/uL (ref 0.1–1.0)
Monocytes Relative: 8 %
Neutro Abs: 3.5 10*3/uL (ref 1.7–7.7)
Neutrophils Relative %: 54 %
Platelet Count: 214 10*3/uL (ref 150–400)
RBC: 3.57 MIL/uL — ABNORMAL LOW (ref 3.87–5.11)
RDW: 12.4 % (ref 11.5–15.5)
WBC Count: 6.5 10*3/uL (ref 4.0–10.5)
nRBC: 0 % (ref 0.0–0.2)

## 2023-11-26 LAB — IRON AND TIBC
Iron: 88 ug/dL (ref 28–170)
Saturation Ratios: 31 % (ref 10.4–31.8)
TIBC: 286 ug/dL (ref 250–450)
UIBC: 198 ug/dL

## 2023-11-26 LAB — FERRITIN: Ferritin: 111 ng/mL (ref 11–307)

## 2023-11-26 NOTE — Progress Notes (Signed)
 Hematology/Oncology Consult note [facility]  [phone] [phone]  Patient Care Team: Solomon Dupre, DO as PCP - General (Family Medicine) Earvin Goldberg, MD as Referring Physician (Physical Medicine and Rehabilitation) Avonne Boettcher, MD as Consulting Physician (Hematology and Oncology) Bertis Brochure, MD (Ophthalmology)   Name of the patient: Teresa [name]  [id number]  July 09, 1946   Date of visit: 11/26/23  Diagnosis-anemia of chronic disease with a component of iron deficiency  Chief complaint/ Reason for visit-routine follow-up of anemia  Heme/Onc history: Patient is a 78 year old female with a past medical history significant for hypertension hyperlipidemia hypothyroidism who was seen by Dr. Tully Gainer for symptoms of constipation.  As a part of her work-up she had her CBC checked which showed a low hemoglobin of 10.7 and hence she has been referred to us .  Looking back at her prior CBCs patient has had a normal white count and a platelet count but hemoglobin has been between 10-11 since 2017 and has remained stable around that range.  There has been no improvement nor decline in her hemoglobin.  Ferritin levels were normal at 77 on 03/12/2021 with normal iron studies.  TSH in April 2022 was normal.     Interval history-she is doing well for her age and denies any complaints at this time.  She has mild fatigue but overall remains active.  Denies any changes in her appetite or weight.  Denies any blood loss in her stool or urine or dark melanotic stools.  ECOG PS- 1 Pain scale- 0   Review of systems- Review of Systems  Constitutional:  Negative for chills, fever, malaise/fatigue and weight loss.  HENT:  Negative for congestion, ear discharge and nosebleeds.   Eyes:  Negative for blurred vision.  Respiratory:  Negative for cough, hemoptysis, sputum production, shortness of breath and wheezing.   Cardiovascular:  Negative  for chest pain, palpitations, orthopnea and claudication.  Gastrointestinal:  Negative for abdominal pain, blood in stool, constipation, diarrhea, heartburn, melena, nausea and vomiting.  Genitourinary:  Negative for dysuria, flank pain, frequency, hematuria and urgency.  Musculoskeletal:  Negative for back pain, joint pain and myalgias.  Skin:  Negative for rash.  Neurological:  Negative for dizziness, tingling, focal weakness, seizures, weakness and headaches.  Endo/Heme/Allergies:  Does not bruise/bleed easily.  Psychiatric/Behavioral:  Negative for depression and suicidal ideas. The patient does not have insomnia.       Allergies  Allergen Reactions   Amlodipine Other (See Comments)    Ankle swelling   Atorvastatin     cramps   Evista [Raloxifene] Nausea Only     Past Medical History:  Diagnosis Date   Diabetes mellitus without complication (HCC)    Heart murmur    Hyperlipidemia    Hypertension    Hypothyroidism    Lichen sclerosus    Obesity    Osteopenia    Stroke [facility])    Thyroid disease      Past Surgical History:  Procedure Laterality Date   carpel tunnel     COLONOSCOPY WITH PROPOFOL N/A 08/30/2020   Procedure: COLONOSCOPY WITH PROPOFOL;  Surgeon: Irby Mannan, MD;  Location: [facility] ENDOSCOPY;  Service: Gastroenterology;  Laterality: N/A;   ROTATOR CUFF REPAIR Left    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      Social History   Socioeconomic History   Marital status: Married    Spouse name: Porfirio Bristol   Number of children: 1   Years of education: Not  on file   Highest education level: High school graduate  Occupational History   Occupation: retired  Tobacco Use   Smoking status: Former    Current packs/day: 0.00    Average packs/day: 0.3 packs/day for 2.4 years (0.6 ttl pk-yrs)    Types: Cigarettes    Start date: 1964    Quit date: 01/1965    Years since quitting: 58.9   Smokeless tobacco: Never  Vaping Use   Vaping status: Never Used  Substance and  Sexual Activity   Alcohol use: No    Alcohol/week: 0.0 standard drinks of alcohol   Drug use: No   Sexual activity: Never  Other Topics Concern   Not on file  Social History Narrative   Not on file   Social Drivers of Health   Financial Resource Strain: Low Risk  (04/22/2023)   Overall Financial Resource Strain (CARDIA)    Difficulty of Paying Living Expenses: Not hard at all  Food Insecurity: No Food Insecurity (04/22/2023)   Hunger Vital Sign    Worried About Running Out of Food in the Last Year: Never true    Ran Out of Food in the Last Year: Never true  Transportation Needs: No Transportation Needs (04/22/2023)   PRAPARE - Administrator, Civil Service (Medical): No    Lack of Transportation (Non-Medical): No  Physical Activity: Inactive (04/22/2023)   Exercise Vital Sign    Days of Exercise per Week: 0 days    Minutes of Exercise per Session: 0 min  Stress: No Stress Concern Present (04/22/2023)   [employer] of Occupational Health - Occupational Stress Questionnaire    Feeling of Stress : Not at all  Social Connections: Moderately Integrated (04/22/2023)   Social Connection and Isolation Panel [NHANES]    Frequency of Communication with Friends and Family: More than three times a week    Frequency of Social Gatherings with Friends and Family: Three times a week    Attends Religious Services: More than 4 times per year    Active Member of Clubs or Organizations: No    Attends Banker Meetings: Never    Marital Status: Married  Catering manager Violence: Not At Risk (04/22/2023)   Humiliation, Afraid, Rape, and Kick questionnaire    Fear of Current or Ex-Partner: No    Emotionally Abused: No    Physically Abused: No    Sexually Abused: No    Family History  Problem Relation Age of Onset   Hypertension Mother    Thyroid disease Mother    Glaucoma Mother    Coronary artery disease Mother    Stroke Father    Diabetes Brother     Hypertension Maternal Grandfather    Breast cancer Neg Hx      Current Outpatient Medications:    ASPIRIN 81 PO, Take by mouth daily., Disp: , Rfl:    blood glucose meter kit and supplies, Dispense based on patient and insurance preference. Use up to four times daily as directed. (FOR ICD-9 250.00, 250.01)., Disp: 1 each, Rfl: 0   Blood Glucose Monitoring Suppl (ONETOUCH VERIO) w/Device KIT, 1 each by Does not apply route daily., Disp: 1 kit, Rfl: 0   chlorthalidone (HYGROTON) 25 MG tablet, Take 1 tablet (25 mg total) by mouth daily., Disp: 90 tablet, Rfl: 1   clobetasol ointment (TEMOVATE) 0.05 %, Apply topically 2 (two) times daily., Disp: 30 g, Rfl: 3   folic acid (FOLVITE) 800 MCG tablet, Take 400 mcg by  mouth daily., Disp: , Rfl:    glucose blood (ONETOUCH VERIO) test strip, Use as instructed, Disp: 100 each, Rfl: 12   glucose blood test strip, 1 each by Other route 2 (two) times daily. Use as instructed, Dx: E11.22, Disp: 100 each, Rfl: 12   Iron, Ferrous Sulfate, 325 (65 Fe) MG TABS, Take 325 mg by mouth daily., Disp: 30 tablet, Rfl: 2   levothyroxine (SYNTHROID) 50 MCG tablet, TAKE 1 TABLET DAILY, Disp: 90 tablet, Rfl: 2   lisinopril (ZESTRIL) 30 MG tablet, Take 1 tablet (30 mg total) by mouth daily., Disp: 90 tablet, Rfl: 1   metFORMIN (GLUCOPHAGE-XR) 500 MG 24 hr tablet, TAKE 2 TABLETS TWICE A DAY, Disp: 360 tablet, Rfl: 1   ondansetron (ZOFRAN) 4 MG tablet, Take 1 tablet (4 mg total) by mouth every 8 (eight) hours as needed for nausea or vomiting., Disp: 20 tablet, Rfl: 0   OneTouch Delica Lancets 33G MISC, 1 each by Does not apply route daily., Disp: 100 each, Rfl: 3   rosuvastatin (CRESTOR) 10 MG tablet, Take 1 tablet (10 mg total) by mouth daily., Disp: 90 tablet, Rfl: 1   vitamin B-12 (CYANOCOBALAMIN) 1000 MCG tablet, Take 1,000 mcg by mouth daily., Disp: , Rfl:   Current Facility-Administered Medications:    cyanocobalamin (VITAMIN B12) injection 1,000 mcg, 1,000 mcg,  Intramuscular, Q30 days, Amariyah, Bazar, DO  Physical exam:  Vitals:   11/26/23 0911  BP: 127/67  Pulse: (!) 57  Resp: 18  Temp: (!) 96.1 F (35.6 C)  TempSrc: Tympanic  SpO2: 100%  Weight: 156 lb 14.4 oz (71.2 kg)  Height: 5\' 2"  (1.575 m)   Physical Exam Cardiovascular:     Rate and Rhythm: Normal rate and regular rhythm.     Heart sounds: Normal heart sounds.  Pulmonary:     Effort: Pulmonary effort is normal.  Skin:    General: Skin is warm and dry.  Neurological:     Mental Status: She is alert and oriented to person, place, and time.      I have personally reviewed labs listed below:    Latest Ref Rng & Units 10/03/2023    9:01 AM  CMP  Glucose 70 - 99 mg/dL 161   BUN 8 - 27 mg/dL 22   Creatinine 0.96 - 1.00 mg/dL 0.45   Sodium 409 - 811 mmol/L 145   Potassium 3.5 - 5.2 mmol/L 4.9   Chloride 96 - 106 mmol/L 108   CO2 20 - 29 mmol/L 22   Calcium 8.7 - 10.3 mg/dL 9.6   Total Protein 6.0 - 8.5 g/dL 6.8   Total Bilirubin 0.0 - 1.2 mg/dL <9.1   Alkaline Phos 44 - 121 IU/L 84   AST 0 - 40 IU/L 15   ALT 0 - 32 IU/L 14       Latest Ref Rng & Units 11/26/2023    8:57 AM  CBC  WBC 4.0 - 10.5 K/uL 6.5   Hemoglobin 12.0 - 15.0 g/dL 47.8   Hematocrit 29.5 - 46.0 % 34.5   Platelets 150 - 400 K/uL 214       Assessment and plan- Patient is a 77 y.o. female with history of anemia of chronic disease with a component of iron deficiency here for routine follow-up  Patient's baseline hemoglobin has remained stable between 10.5-11.5 over the last 7 years.  No definitive decrease in trend.  A year ago she was found to have low iron and she received IV  iron.  Iron studies from today are pending.  Overall given the stability of her counts patient does not require follow-up with me at this time.  She can get CBC ferritin and iron studies checked every 6 months to a year with Dr. Lincoln Renshaw and she can be referred to us  in the future if questions or concerns arise   Visit  Diagnosis 1. Anemia of chronic disease      Dr. Seretha Dance, MD, MPH [facility] [phone] 11/26/2023 9:54 AM

## 2023-12-12 ENCOUNTER — Telehealth: Payer: Self-pay

## 2023-12-12 ENCOUNTER — Other Ambulatory Visit: Payer: Self-pay | Admitting: Family Medicine

## 2023-12-12 NOTE — Telephone Encounter (Signed)
 Copied from CRM [id number]. Topic: Clinical - Medication Refill >> Dec 12, 2023  8:44 AM Georgeann Kindred wrote: Most Recent Primary Care Visit:  Provider: Terre Ferri P  Department[facility]  Visit Type: OFFICE VISIT  Date: 11/03/2023  Medication: levothyroxine  (SYNTHROID ) 50 MCG tablet  Has the patient contacted their pharmacy? Yes (Agent: If no, request that the patient contact the pharmacy for the refill. If patient does not wish to contact the pharmacy document the reason why and proceed with request.) (Agent: If yes, when and what did the pharmacy advise?) Advised they did not have the medication and offered an alternate medication  Is this the correct pharmacy for this prescription? Yes If no, delete pharmacy and type the correct one.  This is the patient's preferred pharmacy:  [facility] [address] [place] - [address] [place] [address] Phone: [phone] Fax: [phone]  Has the prescription been filled recently? No  Is the patient out of the medication? Yes  Has the patient been seen for an appointment in the last year OR does the patient have an upcoming appointment? Yes  Can we respond through MyChart? Yes  Agent: Please be advised that Rx refills may take up to 3 business days. We ask that you follow-up with your pharmacy.

## 2023-12-12 NOTE — Telephone Encounter (Signed)
 No active referral on file currently. Forwarding to PCP to assist.

## 2023-12-12 NOTE — Telephone Encounter (Signed)
 Copied from CRM [id number]. Topic: Clinical - Medication Question >> Dec 12, 2023  8:49 AM Georgeann Kindred wrote: Reason for CRM: Patient now has [employer] and would like to know is there anything that she has to do before she is able to resume getting the injections in her lumbar. She states that she has been getting them for several years, however, she did not get one last year and is needing one now. Last injection was 11/09/21 by Dr. Earvin Goldberg. Please contact patient at [phone].

## 2023-12-15 MED ORDER — LEVOTHYROXINE SODIUM 50 MCG PO TABS: 50.0000 ug | ORAL_TABLET | Freq: Every day | ORAL | 2 refills | Status: DC

## 2023-12-15 NOTE — Telephone Encounter (Signed)
 Change in pharmacy- will forward Requested Prescriptions  Pending Prescriptions Disp Refills   levothyroxine  (SYNTHROID ) 50 MCG tablet 90 tablet 2    Sig: Take 1 tablet (50 mcg total) by mouth daily.     Endocrinology:  Hypothyroid Agents Passed - 12/15/2023  3:26 PM      Passed - TSH in normal range and within 360 days    TSH  Date Value Ref Range Status  10/03/2023 2.210 0.450 - 4.500 uIU/mL Final         Passed - Valid encounter within last 12 months    Recent Outpatient Visits           1 month ago 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)   [place] [facility] [place], Megan P, DO   2 months ago Routine general medical examination at a health care facility   [place] [facility], Megan P, DO

## 2023-12-15 NOTE — Telephone Encounter (Signed)
 Ok for E2C2 to review.  Attempted to reach but appears husband was in a bad service area. If they return the call please advise she can reach out to the office that had previously done the injections to schedule an appointment. If they need new referral we can send at that point.

## 2023-12-15 NOTE — Telephone Encounter (Signed)
 She would still be considered an established patient with them.  I recommend she call and make an appt.  If they require a new referral we can put that in for her.

## 2023-12-16 ENCOUNTER — Other Ambulatory Visit: Payer: Self-pay | Admitting: Family Medicine

## 2023-12-16 DIAGNOSIS — M48062 Spinal stenosis, lumbar region with neurogenic claudication: Secondary | ICD-10-CM | POA: Diagnosis not present

## 2023-12-16 DIAGNOSIS — M5416 Radiculopathy, lumbar region: Secondary | ICD-10-CM | POA: Diagnosis not present

## 2023-12-16 DIAGNOSIS — M7918 Myalgia, other site: Secondary | ICD-10-CM | POA: Diagnosis not present

## 2023-12-16 NOTE — Telephone Encounter (Unsigned)
 Copied from CRM [id number]. Topic: Clinical - Medication Refill >> Dec 16, 2023  3:21 PM Turkey B wrote: Most Recent Primary Care Visit:  Provider: Terre Ferri P  Department[facility]  Visit Type: OFFICE VISIT  Date: 11/03/2023  Medication: levothyroxine  (SYNTHROID ) 50 MCG tablet  Has the patient contacted their pharmacy? Yes  (Agent: If yes, when and what did the pharmacy advise?)contact pcp/pt was told they don't have it, show received 05/05  Is this the correct pharmacy for this prescription? yes   This is the patient's preferred pharmacy:  [facility] [address], [place] - [address] [place] [address] Phone: [phone] Fax: [phone]   Has the prescription been filled recently? no  Is the patient out of the medication? yes  Has the patient been seen for an appointment in the last year OR does the patient have an upcoming appointment? yes  Can we respond through MyChart? yes  Agent: Please be advised that Rx refills may take up to 3 business days. We ask that you follow-up with your pharmacy.

## 2023-12-18 NOTE — Telephone Encounter (Signed)
 Requested by interface surescripts. Duplicate request. In review of chart, receipt confirmed by pharmacy 12/15/23 at 3:27 pm. Called pharmacy x 2 and no answer.  Requested Prescriptions  Refused Prescriptions Disp Refills   levothyroxine  (SYNTHROID ) 50 MCG tablet 90 tablet 2    Sig: Take 1 tablet (50 mcg total) by mouth daily.     Endocrinology:  Hypothyroid Agents Passed - 12/18/2023  3:44 PM      Passed - TSH in normal range and within 360 days    TSH  Date Value Ref Range Status  10/03/2023 2.210 0.450 - 4.500 uIU/mL Final         Passed - Valid encounter within last 12 months    Recent Outpatient Visits           1 month ago 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)   [place] [facility] [place], Megan P, DO   2 months ago Routine general medical examination at a health care facility   [place] [facility], Megan P, DO

## 2023-12-18 NOTE — Telephone Encounter (Signed)
 Attempted to contact [facility] pharmacy listed multiple times and awaited multiple rings ,and no answer to confirm receipt from pharmacy on 12/15/23.

## 2023-12-29 ENCOUNTER — Other Ambulatory Visit: Payer: Self-pay

## 2023-12-31 ENCOUNTER — Other Ambulatory Visit: Payer: Self-pay | Admitting: Family Medicine

## 2024-01-01 NOTE — Telephone Encounter (Signed)
 Requested Prescriptions  Pending Prescriptions Disp Refills   ferrous sulfate  (SV IRON ) 325 (65 FE) MG tablet [Pharmacy Med Name: SV Iron  325 MG Oral Tablet] 90 tablet 1    Sig: Take 1 tablet by mouth once daily     Endocrinology:  Minerals - Iron  Supplementation Failed - 01/01/2024  2:29 PM      Failed - HGB in normal range and within 360 days    Hemoglobin  Date Value Ref Range Status  11/26/2023 11.3 (L) 12.0 - 15.0 g/dL Final  72/53/6644 03.4 (L) 11.1 - 15.9 g/dL Final         Failed - HCT in normal range and within 360 days    HCT  Date Value Ref Range Status  11/26/2023 34.5 (L) 36.0 - 46.0 % Final   Hematocrit  Date Value Ref Range Status  10/03/2023 32.1 (L) 34.0 - 46.6 % Final         Failed - RBC in normal range and within 360 days    RBC  Date Value Ref Range Status  11/26/2023 3.57 (L) 3.87 - 5.11 MIL/uL Final         Passed - Fe (serum) in normal range and within 360 days    Iron   Date Value Ref Range Status  11/26/2023 88 28 - 170 ug/dL Final  74/25/9563 81 27 - 139 ug/dL Final   Iron  Saturation  Date Value Ref Range Status  11/26/2022 36 15 - 55 % Final   Saturation Ratios  Date Value Ref Range Status  11/26/2023 31 10.4 - 31.8 % Final         Passed - Ferritin in normal range and within 360 days    Ferritin  Date Value Ref Range Status  11/26/2023 111 11 - 307 ng/mL Final    Comment:    Performed at [facility], [address] [place], [place] [address]  10/03/2023 217 (H) 15 - 150 ng/mL Final         Passed - Valid encounter within last 12 months    Recent Outpatient Visits           1 month ago 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)   [place] [facility] [place], Megan P, DO   3 months ago Routine general medical examination at a health care facility   [facility] [place], Megan P, DO

## 2024-01-08 ENCOUNTER — Other Ambulatory Visit: Payer: Self-pay | Admitting: Family Medicine

## 2024-01-09 ENCOUNTER — Telehealth: Payer: Self-pay | Admitting: Family Medicine

## 2024-01-09 NOTE — Telephone Encounter (Signed)
 Copied from CRM [id number]. Topic: Clinical - Medication Question >> Jan 09, 2024 10:23 AM Turkey B wrote: Reason for CRM: pt called in, says has been taking   chlorthalidone  (HYGROTON ) 25 MG tablet, but received   nortrpyline hydrochloride 10 mg at the pharmacy, that was ordered by Dr Jacqlyn Matas, so she wants to know is this what she is supposed to be taking , or the previous med

## 2024-01-09 NOTE — Telephone Encounter (Signed)
 Requested Prescriptions  Pending Prescriptions Disp Refills   glucose blood (ONETOUCH VERIO) test strip [Pharmacy Med Name: OneTouch Verio In Vitro Strip] 100 each 1    Sig: USE AS DIRECTED     Endocrinology: Diabetes - Testing Supplies Passed - 01/09/2024  4:54 PM      Passed - Valid encounter within last 12 months    Recent Outpatient Visits           2 months ago 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)   [place] [facility] [place], Megan P, DO   3 months ago Routine general medical examination at a health care facility   [facility], Megan P, DO

## 2024-01-12 DIAGNOSIS — M48062 Spinal stenosis, lumbar region with neurogenic claudication: Secondary | ICD-10-CM | POA: Diagnosis not present

## 2024-01-12 DIAGNOSIS — M5416 Radiculopathy, lumbar region: Secondary | ICD-10-CM | POA: Diagnosis not present

## 2024-01-12 NOTE — Telephone Encounter (Signed)
 Spoke to patient and dicussed with medication she needs to continue, chlorthalidone . She is in agreement.

## 2024-01-15 DIAGNOSIS — E785 Hyperlipidemia, unspecified: Secondary | ICD-10-CM | POA: Diagnosis not present

## 2024-01-15 DIAGNOSIS — Z008 Encounter for other general examination: Secondary | ICD-10-CM | POA: Diagnosis not present

## 2024-01-15 DIAGNOSIS — E1136 Type 2 diabetes mellitus with diabetic cataract: Secondary | ICD-10-CM | POA: Diagnosis not present

## 2024-01-15 DIAGNOSIS — I129 Hypertensive chronic kidney disease with stage 1 through stage 4 chronic kidney disease, or unspecified chronic kidney disease: Secondary | ICD-10-CM | POA: Diagnosis not present

## 2024-01-15 DIAGNOSIS — N1831 Chronic kidney disease, stage 3a: Secondary | ICD-10-CM | POA: Diagnosis not present

## 2024-01-15 DIAGNOSIS — E663 Overweight: Secondary | ICD-10-CM | POA: Diagnosis not present

## 2024-01-15 DIAGNOSIS — E039 Hypothyroidism, unspecified: Secondary | ICD-10-CM | POA: Diagnosis not present

## 2024-01-15 DIAGNOSIS — M1990 Unspecified osteoarthritis, unspecified site: Secondary | ICD-10-CM | POA: Diagnosis not present

## 2024-01-15 DIAGNOSIS — E1122 Type 2 diabetes mellitus with diabetic chronic kidney disease: Secondary | ICD-10-CM | POA: Diagnosis not present

## 2024-01-15 DIAGNOSIS — Z8673 Personal history of transient ischemic attack (TIA), and cerebral infarction without residual deficits: Secondary | ICD-10-CM | POA: Diagnosis not present

## 2024-01-15 DIAGNOSIS — E1169 Type 2 diabetes mellitus with other specified complication: Secondary | ICD-10-CM | POA: Diagnosis not present

## 2024-01-15 DIAGNOSIS — Z6828 Body mass index (BMI) 28.0-28.9, adult: Secondary | ICD-10-CM | POA: Diagnosis not present

## 2024-02-11 DIAGNOSIS — M5380 Other specified dorsopathies, site unspecified: Secondary | ICD-10-CM | POA: Diagnosis not present

## 2024-02-11 DIAGNOSIS — M48062 Spinal stenosis, lumbar region with neurogenic claudication: Secondary | ICD-10-CM | POA: Diagnosis not present

## 2024-02-11 DIAGNOSIS — M5416 Radiculopathy, lumbar region: Secondary | ICD-10-CM | POA: Diagnosis not present

## 2024-02-18 ENCOUNTER — Ambulatory Visit
Admission: RE | Admit: 2024-02-18 | Discharge: 2024-02-18 | Disposition: A | Discharge status: Home / Self Care | Source: Ambulatory Visit | Attending: Family Medicine | Admitting: Family Medicine

## 2024-02-18 ENCOUNTER — Other Ambulatory Visit: Payer: Self-pay | Admitting: Family Medicine

## 2024-02-18 DIAGNOSIS — M5416 Radiculopathy, lumbar region: Secondary | ICD-10-CM

## 2024-02-18 DIAGNOSIS — M5126 Other intervertebral disc displacement, lumbar region: Secondary | ICD-10-CM | POA: Diagnosis not present

## 2024-02-18 DIAGNOSIS — M48061 Spinal stenosis, lumbar region without neurogenic claudication: Secondary | ICD-10-CM | POA: Diagnosis not present

## 2024-02-18 DIAGNOSIS — M4316 Spondylolisthesis, lumbar region: Secondary | ICD-10-CM | POA: Diagnosis not present

## 2024-03-01 DIAGNOSIS — M1611 Unilateral primary osteoarthritis, right hip: Secondary | ICD-10-CM | POA: Diagnosis not present

## 2024-03-01 DIAGNOSIS — M48062 Spinal stenosis, lumbar region with neurogenic claudication: Secondary | ICD-10-CM | POA: Diagnosis not present

## 2024-03-01 DIAGNOSIS — M5416 Radiculopathy, lumbar region: Secondary | ICD-10-CM | POA: Diagnosis not present

## 2024-03-09 ENCOUNTER — Ambulatory Visit

## 2024-03-11 ENCOUNTER — Ambulatory Visit: Attending: Family Medicine

## 2024-03-11 DIAGNOSIS — R262 Difficulty in walking, not elsewhere classified: Secondary | ICD-10-CM | POA: Insufficient documentation

## 2024-03-11 DIAGNOSIS — M5459 Other low back pain: Secondary | ICD-10-CM | POA: Insufficient documentation

## 2024-03-11 DIAGNOSIS — M48061 Spinal stenosis, lumbar region without neurogenic claudication: Secondary | ICD-10-CM | POA: Diagnosis not present

## 2024-03-11 NOTE — Therapy (Signed)
 OUTPATIENT PHYSICAL THERAPY EVALUATION Patient Name: Teresa [name] MRN: [id number] DOB:23-Jun-1946, 78 y.o., female Today's Date: 03/11/2024  END OF SESSION:  PT End of Session - 03/11/24 1100     Visit Number 1    Number of Visits 16    Date for PT Re-Evaluation 05/06/24    Authorization Type [employer]    Authorization Time Period 03/11/24-05/06/24    Progress Note Due on Visit 10    PT Start Time 1100    PT Stop Time 1140    PT Time Calculation (min) 40 min          Past Medical History:  Diagnosis Date   Diabetes mellitus without complication (HCC)    Heart murmur    Hyperlipidemia    Hypertension    Hypothyroidism    Lichen sclerosus    Obesity    Osteopenia    Stroke (HCC)    Thyroid  disease    Past Surgical History:  Procedure Laterality Date   carpel tunnel     COLONOSCOPY WITH PROPOFOL  N/A 08/30/2020   Procedure: COLONOSCOPY WITH PROPOFOL ;  Surgeon: Janalyn Keene NOVAK, MD;  Location: [facility] ENDOSCOPY;  Service: Gastroenterology;  Laterality: N/A;   ROTATOR CUFF REPAIR Left    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      PCP: Louder Duwaine SQUIBB, DO REFERRING PROVIDER: Benton Dowse, FNP  REFERRING DIAG: Lumbar Spinal Stenosis Rationale for Evaluation and Treatment: Rehabilitation THERAPY DIAG:  Spinal stenosis of lumbar region without neurogenic claudication  Difficulty in walking, not elsewhere classified  Other low back pain  ONSET DATE: Chronic, >5 years  SUBJECTIVE:                                                                                                                                                                                          SUBJECTIVE STATEMENT: Pt looking for some help with his chronic low back pain.   PERTINENT HISTORY:  Teresa [name] is a 77yoF who is referred to OPPT by Benton Dowse FNP for lumbar spinal stenosis, presents to [facility] on 03/11/24. PMH chronic low back pain followed by [facility], seen 7/21 for pain  in the right buttock radiating to the right lateral posterior thigh. 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 Rt hip recently cleared of any DJD or acute changes. MRI of lumbar spine July 2025 revealed  moderate to severe stenosis at L2-3, moderate to severe stenosis at L3-4.   Symptoms behavior:  Worst in preceding 7  days: 10/10 Best in preceding 7 days: 5/10 Average in preceding 7 days: 7/10  PAIN:  Are you having pain? Yes 7/10    PRECAUTIONS: None  WEIGHT BEARING RESTRICTIONS: N/A  FALLS:  Has patient fallen in last 6 months? No   LIVING ENVIRONMENT: Lives with: husband (has had 2 strokes)  Lives: 1 level home Stairs: 2 stairs and a ramp  OCCUPATION: Retired   PLOF: Independent   PATIENT GOALS: Be able to be more active with activities of leisure.   OBJECTIVE:  Note: Objective measures were completed at Evaluation unless otherwise noted.  DIAGNOSTIC FINDINGS:  MRI of the lumbar spine from July 2025 revealed multilevel degenerative changes with moderate to severe stenosis at L2-3 and disc retrusion at this level.  Moderate to severe stenosis at L3-4 and mild stenosis at L4-5 -July 2025 imaging has cleared Rt hip of any concern of involvement.  PATIENT SURVEYS:  MODI: 40%   FUNCTIONAL TESTS:  AMB to tolerance: ~642ft, no device, increased pain and antalgia after 533ft  THE INTERVENTION SECTION 03/11/24                                                                                                                               - AMB overground 642ft, no device, 1m16sec, stops due to increase in Rt thigh burning pain  - lumbar flexion table slides x30 - lumbar flexion stretch in chair 2x45sec - lying on lumbar hot pack x3 minutes, hips/knees elevated to 90/90 - hook lying marching x30, alternating - deadbug alternate table/toe taps  1x20 *pain decreased to 4/10 at end of session   PATIENT EDUCATION:  Education details: Exercise form for HEP assignment  Person educated: Patient  Education method: collaborative learning, deliberate practice, positive reinforcement, explicit instruction, establish rules. Education comprehension: fair  HOME EXERCISE PROGRAM: Access Code: [email or web address] URL: [email or web address] Date: 03/11/2024 Prepared by: Peggye Linear  Exercises - Seated Bilateral Shoulder Flexion Towel Slide at Table Top  - 3 x daily - 1 sets - 30 reps - 3sec hold - Seated Flexion Stretch  - 3 x daily - 3 sets - 10 reps - Supine March  - 3 x daily - 1 sets - 30 reps - Supine 90/90 Alternating Toe Touch  - 3 x daily - 1 sets - 20 reps - Sit to Stand with Arms Crossed  - 3 x daily - 1 sets - 10 reps  ASSESSMENT:  CLINICAL IMPRESSION: 77yoF with chronic low back pain and lumbar spondylosis. Pt unable to tolerate community distance without severe increase in pain and neurological symptoms. Patient will benefit from skilled physical therapy intervention to reduce deficits and impairments identified in evaluation, in order to reduce pain, improve quality of life, and maximize activity tolerance for ADL, IADL, and leisure/fitness. Physical therapy will help pt achieve long and short term goals of care.   OBJECTIVE IMPAIRMENTS: Decreased knowledge of condition, decreased use of DME, decreased mobility, difficulty walking, decreased  strength, decreased ROM. ACTIVITY LIMITATIONS: Lifting, standing, walking, squatting, transfers, locomotion level PARTICIPATION LIMITATIONS: Cleaning, laundry, interpersonal relationships, driving, yardwork, community activity.  PERSONAL FACTORS: Age, behavior pattern, education, past/current experiences, transportation, profession are also affecting patient's functional outcome.  REHAB POTENTIAL: Good CLINICAL DECISION MAKING: Unstable/unpredictable EVALUATION COMPLEXITY:  Moderate   GOALS: Goals reviewed with patient? No   SHORT TERM GOALS: Target date: 04/11/24  AMB tolerance of >1066ft without NPRS increase more than 1 of 10 Baseline: 664ft Goal status: INITIAL  2.  MODI score <30% to indicate reduced disability.  Baseline: 40% Goal status: INITIAL  LONG TERM GOALS: Target date: 05/11/24  Pt able to tolerate 1560ft AMB without >1/10 increase in NPRS.  Baseline:  Goal status: INITIAL  2.  5xSTS in <15sec, hands free Baseline: hard  Goal status: INITIAL  3.  MODI score less than 15% to indicate reduced disability.  Baseline:  Goal status: INITIAL  4.  Lumbar flexion ROM improved AEB ability to range out of lordosis curvature during flexion based activities or testing.  Baseline: always in lordosis Goal status: INITIAL  5. Pt to reports worse pain in past 7 days at <8/10 and average pain in last 7 days at ~4/10.              Baseline: worst 10/10, average 7/10   Goal status: initital  PLAN:  PT FREQUENCY: 1-2x/week  PT DURATION: 8 weeks  PLANNED INTERVENTIONS: 97164- PT Re-evaluation, 97750- Physical Performance Testing, 97110-Therapeutic exercises, 97530- Therapeutic activity, [id number]- Neuromuscular re-education, 97535- Self Care, 02859- Manual therapy, [phone]- Gait training, [phone]- Electrical stimulation (unattended), [phone]- Electrical stimulation (manual), Patient/Family education, Balance training, Stair training, Taping, Joint mobilization, Joint manipulation, Spinal manipulation, Spinal mobilization, Cryotherapy, and Moist heat.  PLAN FOR NEXT SESSION: FU on feedback from HEP assignment   Xolani Degracia C, PT 03/11/2024, 11:13 AM  11:13 AM, 03/11/24 Peggye JAYSON Linear, PT, DPT Physical Therapist - [place] [facility]  Outpatient Physical Therapy- Main [facility] [phone]

## 2024-03-16 ENCOUNTER — Encounter

## 2024-03-17 ENCOUNTER — Ambulatory Visit: Attending: Family Medicine

## 2024-03-17 DIAGNOSIS — M5459 Other low back pain: Secondary | ICD-10-CM | POA: Insufficient documentation

## 2024-03-17 DIAGNOSIS — R262 Difficulty in walking, not elsewhere classified: Secondary | ICD-10-CM | POA: Insufficient documentation

## 2024-03-17 DIAGNOSIS — M48061 Spinal stenosis, lumbar region without neurogenic claudication: Secondary | ICD-10-CM | POA: Insufficient documentation

## 2024-03-17 NOTE — Therapy (Addendum)
 OUTPATIENT PHYSICAL THERAPY TREATMENT Patient Name: Teresa [name] MRN: [id number] DOB:1946/06/29, 78 y.o., female Today's Date: 03/11/2024  END OF SESSION:   03/17/24 1315  PT Visits / Re-Eval  Visit Number 2  Number of Visits 16  Date for PT Re-Evaluation 05/06/24  Authorization  Authorization Type [employer]  Authorization Time Period 03/11/24-05/06/24  Progress Note Due on Visit 10  PT Time Calculation  PT Start Time 1314  PT Stop Time 1354  PT Time Calculation (min) 40 min  PT - End of Session  Activity Tolerance Patient tolerated treatment well;No increased pain  Behavior During Therapy WFL for tasks assessed/performed     Past Medical History:  Diagnosis Date   Diabetes mellitus without complication (HCC)    Heart murmur    Hyperlipidemia    Hypertension    Hypothyroidism    Lichen sclerosus    Obesity    Osteopenia    Stroke [facility])    Thyroid  disease    Past Surgical History:  Procedure Laterality Date   carpel tunnel     COLONOSCOPY WITH PROPOFOL  N/A 08/30/2020   Procedure: COLONOSCOPY WITH PROPOFOL ;  Surgeon: Janalyn Keene NOVAK, MD;  Location: [facility] ENDOSCOPY;  Service: Gastroenterology;  Laterality: N/A;   ROTATOR CUFF REPAIR Left    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      PCP: Louder Duwaine SQUIBB, DO REFERRING PROVIDER: Benton Dowse, FNP  REFERRING DIAG: Lumbar Spinal Stenosis Rationale for Evaluation and Treatment: Rehabilitation THERAPY DIAG:  Spinal stenosis of lumbar region without neurogenic claudication  Difficulty in walking, not elsewhere classified  Other low back pain  ONSET DATE: Chronic, >5 years  SUBJECTIVE:                                                                                                                                                                                          SUBJECTIVE STATEMENT: Pt pleased to report improved pain since starting her daily HEP. She is around 4/10 today and average since day after  eval. Pt also starting integrating use of heat therapy on back which has really helped. Her pain remaining has been mostly across the low back, but not into the leg so much.    PERTINENT HISTORY:  Teresa [name] is a 77yoF who is referred to OPPT by Benton Dowse FNP for lumbar spinal stenosis, presents to [facility] on 03/11/24. PMH chronic low back pain followed by [facility], seen 7/21 for pain in the right buttock radiating to the right lateral posterior thigh. tramadol  has been helpful in keeping her more active in and outside of her home Pt has extensive history of Lumbar ESI  with KC going back to 2019, mostly Right sided L5/S1. Rt hip recently cleared of any DJD or acute changes. MRI of lumbar spine July 2025 revealed  moderate to severe stenosis at L2-3, moderate to severe stenosis at L3-4.   Symptoms behavior at time of evaluation:  Worst in preceding 7 days: 10/10 Best in preceding 7 days: 5/10 Average in preceding 7 days: 7/10  PAIN:  Are you having pain? Yes 4/10    PRECAUTIONS: None  WEIGHT BEARING RESTRICTIONS: N/A  FALLS:  Has patient fallen in last 6 months? No   LIVING ENVIRONMENT: Lives with: husband (has had 2 strokes)  Lives: 1 level home Stairs: 2 stairs and a ramp  OCCUPATION: Retired   PLOF: Independent   PATIENT GOALS: Be able to be more active with activities of leisure.   OBJECTIVE:  Note: Objective measures were completed at Evaluation unless otherwise noted.  DIAGNOSTIC FINDINGS:  MRI of the lumbar spine from July 2025 revealed multilevel degenerative changes with moderate to severe stenosis at L2-3 and disc retrusion at this level.  Moderate to severe stenosis at L3-4 and mild stenosis at L4-5 -July 2025 imaging has cleared Rt hip of any concern of involvement.  PATIENT SURVEYS:  MODI: 40%   FUNCTIONAL TESTS:  AMB to tolerance: ~67ft, no device, increased pain and antalgia after 539ft  THE INTERVENTION SECTION 03/17/24                                                                                                                                - Blue phys ball rollouts x30, then sustained flexion stretch 2x30sec  - foot on 2nd step forward lunge hip flexor stretch 2x30sec bilat  - transition to hooklying with heat to back  - hooklying marching 1x60 - hooklying faber stretch 1x30sec bilat (increased Rt sciatic pulling)   - deadbug alternating toe touches 1x20 (cues needed for correct sequencing)  - green pilatesball ADD squeeze 10x3secH  - hooklying marching c YTB 2x15 (resistance for psoas targeting and adductor inhibition)  - green pilatesball ADD squeeze 10x3secH  - hooklying reverse curlup c green ball between knees 15x5secH  - lumbar stretch with hips/knees at 90/90 supported x3 minutes, then [facility] version 1x90sec each  - hooklying reverse curlup c green ball between knees 15x5secH  -overground AMB 455ft, no device, pace ad lib; starting pain 0/10, ending pain 0/10 with some tightness in low back 30m31sec (0.68m/s)    PATIENT EDUCATION:  Education details: form for new stretches in session today.  Person educated: Patient  Education method: collaborative learning, deliberate practice, positive reinforcement, explicit instruction, establish rules. Education comprehension: fair  HOME EXERCISE PROGRAM: Access Code: [email or web address] URL: [email or web address] Date: 03/11/2024 Prepared by: Peggye Linear  Exercises - Seated Bilateral Shoulder Flexion Towel Slide at Table Top  - 3 x daily - 1 sets - 30 reps - 3sec hold - Seated Flexion Stretch  - 3 x daily - 3 sets -  10 reps - Supine March  - 3 x daily - 1 sets - 30 reps - Supine 90/90 Alternating Toe Touch  - 3 x daily - 1 sets - 20 reps - Sit to Stand with Arms Crossed  - 3 x daily - 1 sets - 10 reps  ASSESSMENT:  CLINICAL IMPRESSION: 77yoF with chronic low back pain and lumbar spondylosis. Pt unable to tolerate community distance without severe increase in  pain and neurological symptoms. Repeated HEP for review then  expanded on program, thus far pt report already experiencing a big improvement in her symptoms at home. Pt still hoping to limit frequency for financial reasons. Pt still needs some cues for correct sequencing on some exercises. Patient will benefit from skilled physical therapy intervention to reduce deficits and impairments identified in evaluation, in order to reduce pain, improve quality of life, and maximize activity tolerance for ADL, IADL, and leisure/fitness. Physical therapy will help pt achieve long and short term goals of care.   OBJECTIVE IMPAIRMENTS: Decreased knowledge of condition, decreased use of DME, decreased mobility, difficulty walking, decreased strength, decreased ROM. ACTIVITY LIMITATIONS: Lifting, standing, walking, squatting, transfers, locomotion level PARTICIPATION LIMITATIONS: Cleaning, laundry, interpersonal relationships, driving, yardwork, community activity.  PERSONAL FACTORS: Age, behavior pattern, education, past/current experiences, transportation, profession are also affecting patient's functional outcome.  REHAB POTENTIAL: Good CLINICAL DECISION MAKING: Unstable/unpredictable EVALUATION COMPLEXITY: Moderate   GOALS: Goals reviewed with patient? No   SHORT TERM GOALS: Target date: 04/11/24  AMB tolerance of >1093ft without NPRS increase more than 1 of 10 Baseline: 627ft Goal status: INITIAL  2.  MODI score <30% to indicate reduced disability.  Baseline: 40% Goal status: INITIAL  LONG TERM GOALS: Target date: 05/11/24  Pt able to tolerate 1567ft AMB without >1/10 increase in NPRS.  Baseline:  Goal status: INITIAL  2.  5xSTS in <15sec, hands free Baseline: hard  Goal status: INITIAL  3.  MODI score less than 15% to indicate reduced disability.  Baseline:  Goal status: INITIAL  4.  Lumbar flexion ROM improved AEB ability to range out of lordosis curvature during flexion based  activities or testing.  Baseline: always in lordosis Goal status: INITIAL  5. Pt to reports worse pain in past 7 days at <8/10 and average pain in last 7 days at ~4/10.              Baseline: worst 10/10, average 7/10   Goal status: initital  PLAN:  PT FREQUENCY: 1-2x/week  PT DURATION: 8 weeks  PLANNED INTERVENTIONS: 97164- PT Re-evaluation, 97750- Physical Performance Testing, 97110-Therapeutic exercises, 97530- Therapeutic activity, [id number]- Neuromuscular re-education, 97535- Self Care, 02859- Manual therapy, [phone]- Gait training, [phone]- Electrical stimulation (unattended), [phone]- Electrical stimulation (manual), Patient/Family education, Balance training, Stair training, Taping, Joint mobilization, Joint manipulation, Spinal manipulation, Spinal mobilization, Cryotherapy, and Moist heat.  PLAN FOR NEXT SESSION: continued with lumbar flexion ROM, hip extension ROM, core strength building; consider adding in forward lunge stretch if able to perform with minimal cues (will need a home specific setup)    Candus Braud C, PT 03/11/2024, 11:13 AM  11:13 AM, 03/11/24 Peggye JAYSON Linear, PT, DPT Physical Therapist - [facility]  Outpatient Physical Therapy- Main [facility] [phone]    *addendum on 03/24/24 to correct missing end of session information and date of interventions.   3:25 PM, 03/24/24 Peggye JAYSON Linear, PT, DPT Physical Therapist - [place] [facility]  Outpatient Physical Therapy- Main Campus [phone]

## 2024-03-18 ENCOUNTER — Encounter

## 2024-03-23 ENCOUNTER — Encounter

## 2024-03-24 ENCOUNTER — Ambulatory Visit: Admitting: Physical Therapy

## 2024-03-24 DIAGNOSIS — M48061 Spinal stenosis, lumbar region without neurogenic claudication: Secondary | ICD-10-CM | POA: Diagnosis not present

## 2024-03-24 DIAGNOSIS — M5459 Other low back pain: Secondary | ICD-10-CM

## 2024-03-24 DIAGNOSIS — R262 Difficulty in walking, not elsewhere classified: Secondary | ICD-10-CM

## 2024-03-24 NOTE — Therapy (Signed)
 OUTPATIENT PHYSICAL THERAPY TREATMENT Patient Name: Teresa [name] MRN: [id number] DOB:1945/10/18, 78 y.o., female Today's Date: 03/11/2024  END OF SESSION:   PT End of Session - 03/24/24 1451     Visit Number 3    Number of Visits 16    Date for PT Re-Evaluation 05/06/24    Authorization Type [employer]    Authorization Time Period 03/11/24-05/06/24    Progress Note Due on Visit 10    PT Start Time 1451    PT Stop Time 1529    PT Time Calculation (min) 38 min    Activity Tolerance Patient tolerated treatment well;No increased pain    Behavior During Therapy WFL for tasks assessed/performed           Past Medical History:  Diagnosis Date   Diabetes mellitus without complication (HCC)    Heart murmur    Hyperlipidemia    Hypertension    Hypothyroidism    Lichen sclerosus    Obesity    Osteopenia    Stroke [facility])    Thyroid  disease    Past Surgical History:  Procedure Laterality Date   carpel tunnel     COLONOSCOPY WITH PROPOFOL  N/A 08/30/2020   Procedure: COLONOSCOPY WITH PROPOFOL ;  Surgeon: Janalyn Keene NOVAK, MD;  Location: [facility] ENDOSCOPY;  Service: Gastroenterology;  Laterality: N/A;   ROTATOR CUFF REPAIR Left    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      PCP: Louder Duwaine SQUIBB, DO REFERRING PROVIDER: Benton Dowse, FNP  REFERRING DIAG: Lumbar Spinal Stenosis Rationale for Evaluation and Treatment: Rehabilitation THERAPY DIAG:  Spinal stenosis of lumbar region without neurogenic claudication  Difficulty in walking, not elsewhere classified  Other low back pain  ONSET DATE: Chronic, >5 years  SUBJECTIVE:                                                                                                                                                                                          SUBJECTIVE STATEMENT: Pt states my back is doing good, but my butt hurts from R hip bone to the L hip bone. Pt states she couldn't do part of her HEP last night because the  pain was so bad. Pt reports pain currently 6/10, but reports it is worse in the morning when she first gets up because she is stiff, but eases off a little when she gets moving to the point she can deal with it. Pt describes it like a sharp, shooting pain. Pt points pain is located directly at bilaterally piriformis regions and states it feels like it starts on R side and radiates to L.  Pt states MD  called her this morning and physician said they were going to call her in some more medication to help with the pain. Pt believes it was prednisone and plans to pick it up from pharmacy today.  Pt states her back got better, but then her hips gradually got worse with the exercises.  PERTINENT HISTORY:  Teresa [name] is a 77yoF who is referred to OPPT by Benton Dowse FNP for lumbar spinal stenosis, presents to [facility] on 03/11/24. PMH chronic low back pain followed by [facility], seen 7/21 for pain in the right buttock radiating to the right lateral posterior thigh. 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 Rt hip recently cleared of any DJD or acute changes. MRI of lumbar spine July 2025 revealed  moderate to severe stenosis at L2-3, moderate to severe stenosis at L3-4.   Symptoms behavior at time of evaluation:  Worst in preceding 7 days: 10/10 Best in preceding 7 days: 5/10 Average in preceding 7 days: 7/10  PAIN:  Are you having pain? Yes 4/10    PRECAUTIONS: None  WEIGHT BEARING RESTRICTIONS: N/A  FALLS:  Has patient fallen in last 6 months? No   LIVING ENVIRONMENT: Lives with: husband (has had 2 strokes)  Lives: 1 level home Stairs: 2 stairs and a ramp  OCCUPATION: Retired   PLOF: Independent   PATIENT GOALS: Be able to be more active with activities of leisure.   OBJECTIVE:  Note: Objective measures were completed at Evaluation unless  otherwise noted.  DIAGNOSTIC FINDINGS:  MRI of the lumbar spine from July 2025 revealed multilevel degenerative changes with moderate to severe stenosis at L2-3 and disc retrusion at this level.  Moderate to severe stenosis at L3-4 and mild stenosis at L4-5 -July 2025 imaging has cleared Rt hip of any concern of involvement.  PATIENT SURVEYS:  MODI: 40%   FUNCTIONAL TESTS:  AMB to tolerance: ~615ft, no device, increased pain and antalgia after 535ft  THE INTERVENTION SECTION: 03/24/2024  Palpation Assessment: tenderness to palpation at bilateral piriformis  Manual therapy: initiated IASTM using therastick but discontinued and transitioned to trigger point release to R piriformis 3x 30 seconds  Supine piriformis stretch 2x30sec per LE  Pt reports no noticeable improvement in pain symptoms following manual therapy and and stretching.  Pt states the setaed forward trunk flexion exercise causes discomfort in her hips when she bneds all the way to the ground - educated to decrease ROM. Performed x10reps and pt denies any hip pain with this.  Pt states she has trouble doing sit<>stands without UE support at home - educated on recommendation of elevating seat height by placing dense, folded blankets in the chair. Educated pt on ensuring she keeps her feet and knees, hip width distance apart while rising to stand and lowering back to sitting 2x 10 reps - 2nd set is very fatiguing to patient Pt initially states some muscle pulling discomfort in hamstrings - educated to ensure she brings her feet back underneath her BOS and this improves those symptoms  Pt states no pain at this point in session during seated break.   Educated pt on self-massage to glutes using tennis ball against a wall - visual demonstration provided.  When ambulating out of clinic, pt reports some discomfort in L gluteal muscles, but not on R side, indicating patient with good response to manual therapy  although pt  reporting pain during it. Therefore, educated pt to attempt self-massage at home using tennis ball on L side today.  Educated pt to ensure adequate water intake following manual therapy.   Pt appreciative of all education provided today.    PATIENT EDUCATION:  Education details: form for new stretches in session today.  Person educated: Patient  Education method: collaborative learning, deliberate practice, positive reinforcement, explicit instruction, establish rules. Education comprehension: fair  HOME EXERCISE PROGRAM: Access Code: [email or web address] URL: [email or web address] Date: 03/24/2024 Prepared by: Connell Kiss  Exercises - Seated Bilateral Shoulder Flexion Towel Slide at Table Top  - 3 x daily - 1 sets - 30 reps - 3sec hold - Seated Flexion Stretch  - 3 x daily - 3 sets - 10 reps - Supine March  - 3 x daily - 1 sets - 30 reps - Supine 90/90 Alternating Toe Touch  - 3 x daily - 1 sets - 20 reps - Sit to Stand with Arms Crossed  - 3 x daily - 1 sets - 10 reps - Supine Piriformis Stretch with Foot on Ground  - 1 x daily - 7 x weekly - 2 sets - 30 second hold  ASSESSMENT:  CLINICAL IMPRESSION: 77yoF with chronic low back pain and lumbar spondylosis. Pt still hoping to limit frequency of therapy visits for financial reasons and is considering transitioning to a different clinic that is in-network for her insurance. Patient continues to report overall improvement in her low back pain symptoms; however, states she has gradually had worsening pain across her bilateral buttocks (starting on R side and shooting, sharp pain across to L side). Patient reports having pain with forward trunk flexion exercise as well as sit<>stand exercise on her HEP; therefore, therapist educated pt on how to modify those interventions to complete without pain. Therapist also initiated manual therapy with trigger point release to R piriformis and despite pt reporting discomfort during STM, pt did report  improvement in her symptoms on R side compared to L side at end of session. Patient will benefit from continued skilled physical therapy intervention to reduce deficits and impairments identified in evaluation, in order to reduce pain, improve quality of life, and maximize activity tolerance for ADL, IADL, and leisure/fitness. Physical therapy will help pt achieve long and short term goals of care.   OBJECTIVE IMPAIRMENTS: Decreased knowledge of condition, decreased use of DME, decreased mobility, difficulty walking, decreased strength, decreased ROM. ACTIVITY LIMITATIONS: Lifting, standing, walking, squatting, transfers, locomotion level PARTICIPATION LIMITATIONS: Cleaning, laundry, interpersonal relationships, driving, yardwork, community activity.  PERSONAL FACTORS: Age, behavior pattern, education, past/current experiences, transportation, profession are also affecting patient's functional outcome.  REHAB POTENTIAL: Good CLINICAL DECISION MAKING: Unstable/unpredictable EVALUATION COMPLEXITY: Moderate   GOALS: Goals reviewed with patient? No   SHORT TERM GOALS: Target date: 04/11/24  AMB tolerance of >1034ft without NPRS increase more than 1 of 10 Baseline: 644ft Goal status: INITIAL  2.  MODI score <30% to indicate reduced disability.  Baseline: 40% Goal status: INITIAL  LONG TERM GOALS: Target date: 05/11/24  Pt able to tolerate 1573ft AMB without >1/10 increase in NPRS.  Baseline:  Goal status: INITIAL  2.  5xSTS in <15sec, hands free Baseline: hard  Goal status: INITIAL  3.  MODI score less than 15% to indicate reduced disability.  Baseline:  Goal status: INITIAL  4.  Lumbar flexion ROM improved AEB ability to range out of lordosis curvature during flexion based activities or testing.  Baseline: always in lordosis Goal  status: INITIAL  5. Pt to reports worse pain in past 7 days at <8/10 and average pain in last 7 days at ~4/10.              Baseline: worst 10/10,  average 7/10   Goal status: initital  PLAN:  PT FREQUENCY: 1-2x/week  PT DURATION: 8 weeks  PLANNED INTERVENTIONS: 97164- PT Re-evaluation, 97750- Physical Performance Testing, 97110-Therapeutic exercises, 97530- Therapeutic activity, [id number]- Neuromuscular re-education, 97535- Self Care, [id number]- Manual therapy, [phone]- Gait training, [phone]- Electrical stimulation (unattended), [phone]- Electrical stimulation (manual), Patient/Family education, Balance training, Stair training, Taping, Joint mobilization, Joint manipulation, Spinal manipulation, Spinal mobilization, Cryotherapy, and Moist heat.  PLAN FOR NEXT SESSION:  Continue further assessment of bilateral buttocks pain Continues manual trigger point release at piriformis if pt with good response continued with lumbar flexion ROM, hip extension ROM, core strength building; consider adding in forward lunge stretch if able to perform with minimal cues (will need a home specific setup)     Brei Pociask, PT, DPT, NCS, CSRS Physical Therapist - [facility]  [facility]  5:22 PM 03/24/24

## 2024-03-25 ENCOUNTER — Ambulatory Visit

## 2024-03-30 ENCOUNTER — Encounter

## 2024-03-31 ENCOUNTER — Ambulatory Visit

## 2024-04-01 ENCOUNTER — Encounter

## 2024-04-06 ENCOUNTER — Encounter

## 2024-04-07 ENCOUNTER — Ambulatory Visit: Admitting: Family Medicine

## 2024-04-07 ENCOUNTER — Encounter: Payer: Self-pay | Admitting: Family Medicine

## 2024-04-07 VITALS — BP 116/76 | HR 56 | Temp 97.4°F | Ht 62.0 in | Wt 152.0 lb

## 2024-04-07 DIAGNOSIS — E039 Hypothyroidism, unspecified: Secondary | ICD-10-CM | POA: Diagnosis not present

## 2024-04-07 DIAGNOSIS — I129 Hypertensive chronic kidney disease with stage 1 through stage 4 chronic kidney disease, or unspecified chronic kidney disease: Secondary | ICD-10-CM | POA: Diagnosis not present

## 2024-04-07 DIAGNOSIS — D509 Iron deficiency anemia, unspecified: Secondary | ICD-10-CM

## 2024-04-07 DIAGNOSIS — E1122 Type 2 diabetes mellitus with diabetic chronic kidney disease: Secondary | ICD-10-CM | POA: Diagnosis not present

## 2024-04-07 DIAGNOSIS — N1831 Chronic kidney disease, stage 3a: Secondary | ICD-10-CM | POA: Diagnosis not present

## 2024-04-07 DIAGNOSIS — E538 Deficiency of other specified B group vitamins: Secondary | ICD-10-CM | POA: Diagnosis not present

## 2024-04-07 DIAGNOSIS — E782 Mixed hyperlipidemia: Secondary | ICD-10-CM

## 2024-04-07 DIAGNOSIS — I10 Essential (primary) hypertension: Secondary | ICD-10-CM | POA: Diagnosis not present

## 2024-04-07 LAB — BAYER DCA HB A1C WAIVED: HB A1C (BAYER DCA - WAIVED): 6.5 % — ABNORMAL HIGH (ref 4.8–5.6)

## 2024-04-07 MED ORDER — ROSUVASTATIN CALCIUM 10 MG PO TABS: 10.0000 mg | ORAL_TABLET | Freq: Every day | ORAL | 1 refills | Status: AC

## 2024-04-07 MED ORDER — CHLORTHALIDONE 25 MG PO TABS: 25.0000 mg | ORAL_TABLET | Freq: Every day | ORAL | 1 refills | Status: AC

## 2024-04-07 MED ORDER — METFORMIN HCL ER 500 MG PO TB24: ORAL_TABLET | ORAL | 1 refills | Status: AC

## 2024-04-07 MED ORDER — FERROUS SULFATE 325 (65 FE) MG PO TABS: 325.0000 mg | ORAL_TABLET | Freq: Every day | ORAL | 1 refills | Status: DC

## 2024-04-07 MED ORDER — LISINOPRIL 30 MG PO TABS: 30.0000 mg | ORAL_TABLET | Freq: Every day | ORAL | 1 refills | Status: DC

## 2024-04-07 NOTE — Assessment & Plan Note (Signed)
 Rechecking labs today. Await results. Treat as needed.

## 2024-04-07 NOTE — Assessment & Plan Note (Signed)
 Under good control on current regimen. Continue current regimen. Continue to monitor. Call with any concerns. Refills given. Labs drawn today.

## 2024-04-07 NOTE — Progress Notes (Signed)
 BP 116/76 (BP Location: Left Arm, Cuff Size: Normal)   Pulse (!) 56   Temp (!) 97.4 F (36.3 C) (Oral)   Ht 5' 2 (1.575 m)   Wt 152 lb (68.9 kg)   LMP  (LMP Unknown)   SpO2 98%   BMI 27.80 kg/m    Subjective:    Patient ID: Teresa [name], female    DOB: 08-18-1945, 78 y.o.   MRN: [id number]  HPI: Teresa [name] is a 78 y.o. female  Chief Complaint  Patient presents with   Hypertension   Back Pain    Starting new pt next month    Has been having pain in her back- going to PT but it was very expensive. It is helping. She is starting a new PT next week.  DIABETES Hypoglycemic episodes:no Polydipsia/polyuria: no Visual disturbance: no Chest pain: no Paresthesias: no Glucose Monitoring: no  Accucheck frequency: Not Checking Taking Insulin ?: no Blood Pressure Monitoring: not checking Retinal Examination: Up to Date Foot Exam: Up to Date Diabetic Education: Completed Pneumovax: Up to Date Influenza: Not up to Date Aspirin: yes  HYPERTENSION / HYPERLIPIDEMIA Satisfied with current treatment? yes Duration of hypertension: chronic BP monitoring frequency: not checking BP medication side effects: no Past BP meds: lisinopril , chlorthalidone  Duration of hyperlipidemia: chronic Cholesterol medication side effects: no Cholesterol supplements: none Past cholesterol medications: crestor  Medication compliance: excellent compliance Aspirin: yes Recent stressors: no Recurrent headaches: no Visual changes: no Palpitations: no Dyspnea: no Chest pain: no Lower extremity edema: no Dizzy/lightheaded: no  ANEMIA Anemia status: controlled Etiology of anemia: iron  deficient Duration of anemia treatment: chronic Compliance with treatment: excellent compliance Iron  supplementation side effects: no Severity of anemia: mild Fatigue: no Decreased exercise tolerance: no  Dyspnea on exertion: no Palpitations: no Bleeding: no Pica: no  HYPOTHYROIDISM Thyroid   control status:controlled Satisfied with current treatment? yes Medication side effects: no Medication compliance: excellent compliance Recent dose adjustment:no Fatigue: no Cold intolerance: no Heat intolerance: no Weight gain: no Weight loss: no Constipation: no Diarrhea/loose stools: no Palpitations: no Lower extremity edema: no Anxiety/depressed mood: no   Relevant past medical, surgical, family and social history reviewed and updated as indicated. Interim medical history since our last visit reviewed. Allergies and medications reviewed and updated.  Review of Systems  Constitutional: Negative.   Respiratory: Negative.    Cardiovascular: Negative.   Musculoskeletal:  Positive for back pain and myalgias. Negative for arthralgias, gait problem, joint swelling, neck pain and neck stiffness.  Skin: Negative.   Neurological: Negative.   Psychiatric/Behavioral: Negative.      Per HPI unless specifically indicated above     Objective:    BP 116/76 (BP Location: Left Arm, Cuff Size: Normal)   Pulse (!) 56   Temp (!) 97.4 F (36.3 C) (Oral)   Ht 5' 2 (1.575 m)   Wt 152 lb (68.9 kg)   LMP  (LMP Unknown)   SpO2 98%   BMI 27.80 kg/m   Wt Readings from Last 3 Encounters:  04/07/24 152 lb (68.9 kg)  11/26/23 156 lb 14.4 oz (71.2 kg)  11/03/23 154 lb (69.9 kg)    Physical Exam Vitals and nursing note reviewed.  Constitutional:      General: She is not in acute distress.    Appearance: Normal appearance. She is not ill-appearing, toxic-appearing or diaphoretic.  HENT:     Head: Normocephalic and atraumatic.     Right Ear: External ear normal.     Left Ear: External  ear normal.     Nose: Nose normal.     Mouth/Throat:     Mouth: Mucous membranes are moist.     Pharynx: Oropharynx is clear.  Eyes:     General: No scleral icterus.       Right eye: No discharge.        Left eye: No discharge.     Extraocular Movements: Extraocular movements intact.      Conjunctiva/sclera: Conjunctivae normal.     Pupils: Pupils are equal, round, and reactive to light.  Cardiovascular:     Rate and Rhythm: Normal rate and regular rhythm.     Pulses: Normal pulses.     Heart sounds: Normal heart sounds. No murmur heard.    No friction rub. No gallop.  Pulmonary:     Effort: Pulmonary effort is normal. No respiratory distress.     Breath sounds: Normal breath sounds. No stridor. No wheezing, rhonchi or rales.  Chest:     Chest wall: No tenderness.  Musculoskeletal:        General: Normal range of motion.     Cervical back: Normal range of motion and neck supple.  Skin:    General: Skin is warm and dry.     Capillary Refill: Capillary refill takes less than 2 seconds.     Coloration: Skin is not jaundiced or pale.     Findings: No bruising, erythema, lesion or rash.  Neurological:     General: No focal deficit present.     Mental Status: She is alert and oriented to person, place, and time. Mental status is at baseline.  Psychiatric:        Mood and Affect: Mood normal.        Behavior: Behavior normal.        Thought Content: Thought content normal.        Judgment: Judgment normal.     Results for orders placed or performed in visit on 11/26/23  Ferritin   Collection Time: 11/26/23  8:57 AM  Result Value Ref Range   Ferritin 111 11 - 307 ng/mL  CBC with Differential (Cancer Center Only)   Collection Time: 11/26/23  8:57 AM  Result Value Ref Range   WBC Count 6.5 4.0 - 10.5 K/uL   RBC 3.57 (L) 3.87 - 5.11 MIL/uL   Hemoglobin 11.3 (L) 12.0 - 15.0 g/dL   HCT 65.4 (L) 63.9 - 53.9 %   MCV 96.6 80.0 - 100.0 fL   MCH 31.7 26.0 - 34.0 pg   MCHC 32.8 30.0 - 36.0 g/dL   RDW 87.5 88.4 - 84.4 %   Platelet Count 214 150 - 400 K/uL   nRBC 0.0 0.0 - 0.2 %   Neutrophils Relative % 54 %   Neutro Abs 3.5 1.7 - 7.7 K/uL   Lymphocytes Relative 35 %   Lymphs Abs 2.3 0.7 - 4.0 K/uL   Monocytes Relative 8 %   Monocytes Absolute 0.5 0.1 - 1.0 K/uL    Eosinophils Relative 2 %   Eosinophils Absolute 0.2 0.0 - 0.5 K/uL   Basophils Relative 1 %   Basophils Absolute 0.0 0.0 - 0.1 K/uL   Immature Granulocytes 0 %   Abs Immature Granulocytes 0.02 0.00 - 0.07 K/uL  Iron  and TIBC   Collection Time: 11/26/23  8:57 AM  Result Value Ref Range   Iron  88 28 - 170 ug/dL   TIBC 713 749 - 549 ug/dL   Saturation Ratios 31 10.4 - 31.8 %  UIBC 198 ug/dL      Assessment & Plan:   Problem List Items Addressed This Visit       Cardiovascular and Mediastinum   Essential hypertension - Primary (Chronic)   Under good control on current regimen. Continue current regimen. Continue to monitor. Call with any concerns. Refills given. Labs drawn today.        Relevant Medications   chlorthalidone  (HYGROTON ) 25 MG tablet   lisinopril  (ZESTRIL ) 30 MG tablet   rosuvastatin  (CRESTOR ) 10 MG tablet   Other Relevant Orders   Comprehensive metabolic panel with GFR     Endocrine   Hypothyroidism (Chronic)   Rechecking labs today. Await results. Treat as needed.       Relevant Orders   Comprehensive metabolic panel with GFR   TSH   Type 2 diabetes mellitus with stage 3 chronic kidney disease, without long-term current use of insulin  (HCC) (Chronic)   Up slightly with A1c of 6.5 up from 6.0. Will continue current regimen and recheck in 3 months. Call with any concerns. Refills given.       Relevant Medications   lisinopril  (ZESTRIL ) 30 MG tablet   metFORMIN  (GLUCOPHAGE -XR) 500 MG 24 hr tablet   rosuvastatin  (CRESTOR ) 10 MG tablet   Other Relevant Orders   Bayer DCA Hb A1c Waived   Comprehensive metabolic panel with GFR     Genitourinary   Hypertensive CKD (chronic kidney disease)   Relevant Medications   lisinopril  (ZESTRIL ) 30 MG tablet     Other   Hyperlipidemia (Chronic)   Under good control on current regimen. Continue current regimen. Continue to monitor. Call with any concerns. Refills given. Labs drawn today.       Relevant  Medications   chlorthalidone  (HYGROTON ) 25 MG tablet   lisinopril  (ZESTRIL ) 30 MG tablet   rosuvastatin  (CRESTOR ) 10 MG tablet   Other Relevant Orders   Comprehensive metabolic panel with GFR   Lipid Panel w/o Chol/HDL Ratio   B12 deficiency   Rechecking labs today. Await results. Treat as needed.      Relevant Orders   Comprehensive metabolic panel with GFR   B12   Iron  deficiency anemia   Rechecking labs today. Await results. Treat as needed.      Relevant Medications   ferrous sulfate  (SV IRON ) 325 (65 FE) MG tablet   Other Relevant Orders   CBC with Differential/Platelet   Comprehensive metabolic panel with GFR   Ferritin   Iron  Binding Cap (TIBC)([facility])     Follow up plan: Return in about 3 months (around 07/08/2024).

## 2024-04-07 NOTE — Assessment & Plan Note (Signed)
 Up slightly with A1c of 6.5 up from 6.0. Will continue current regimen and recheck in 3 months. Call with any concerns. Refills given.

## 2024-04-08 ENCOUNTER — Ambulatory Visit: Payer: Self-pay | Admitting: Family Medicine

## 2024-04-08 ENCOUNTER — Encounter

## 2024-04-08 DIAGNOSIS — E538 Deficiency of other specified B group vitamins: Secondary | ICD-10-CM

## 2024-04-08 LAB — CBC WITH DIFFERENTIAL/PLATELET
Basophils Absolute: 0 x10E3/uL (ref 0.0–0.2)
Basos: 1 %
EOS (ABSOLUTE): 0.2 x10E3/uL (ref 0.0–0.4)
Eos: 3 %
Hematocrit: 32.9 % — ABNORMAL LOW (ref 34.0–46.6)
Hemoglobin: 10.6 g/dL — ABNORMAL LOW (ref 11.1–15.9)
Immature Grans (Abs): 0 x10E3/uL (ref 0.0–0.1)
Immature Granulocytes: 0 %
Lymphocytes Absolute: 2.1 x10E3/uL (ref 0.7–3.1)
Lymphs: 30 %
MCH: 31.9 pg (ref 26.6–33.0)
MCHC: 32.2 g/dL (ref 31.5–35.7)
MCV: 99 fL — ABNORMAL HIGH (ref 79–97)
Monocytes Absolute: 0.5 x10E3/uL (ref 0.1–0.9)
Monocytes: 7 %
Neutrophils Absolute: 4.1 x10E3/uL (ref 1.4–7.0)
Neutrophils: 59 %
Platelets: 240 x10E3/uL (ref 150–450)
RBC: 3.32 x10E6/uL — ABNORMAL LOW (ref 3.77–5.28)
RDW: 12.2 % (ref 11.7–15.4)
WBC: 7 x10E3/uL (ref 3.4–10.8)

## 2024-04-08 LAB — COMPREHENSIVE METABOLIC PANEL WITH GFR
ALT: 17 IU/L (ref 0–32)
AST: 16 IU/L (ref 0–40)
Albumin: 4.4 g/dL (ref 3.8–4.8)
Alkaline Phosphatase: 78 IU/L (ref 44–121)
BUN/Creatinine Ratio: 26 (ref 12–28)
BUN: 27 mg/dL (ref 8–27)
Bilirubin Total: <0.2 mg/dL (ref 0.0–1.2)
CO2: 21 mmol/L (ref 20–29)
Calcium: 9.6 mg/dL (ref 8.7–10.3)
Chloride: 108 mmol/L — ABNORMAL HIGH (ref 96–106)
Creatinine, Ser: 1.02 mg/dL — ABNORMAL HIGH (ref 0.57–1.00)
Globulin, Total: 2.2 g/dL (ref 1.5–4.5)
Glucose: 178 mg/dL — ABNORMAL HIGH (ref 70–99)
Potassium: 4.3 mmol/L (ref 3.5–5.2)
Sodium: 143 mmol/L (ref 134–144)
Total Protein: 6.6 g/dL (ref 6.0–8.5)
eGFR: 57 mL/min/1.73 — ABNORMAL LOW (ref >59)

## 2024-04-08 LAB — LIPID PANEL W/O CHOL/HDL RATIO
Cholesterol, Total: 125 mg/dL (ref 100–199)
HDL: 52 mg/dL (ref >39)
LDL Chol Calc (NIH): 58 mg/dL (ref 0–99)
Triglycerides: 73 mg/dL (ref 0–149)
VLDL Cholesterol Cal: 15 mg/dL (ref 5–40)

## 2024-04-08 LAB — IRON AND TIBC
Iron Saturation: 34 % (ref 15–55)
Iron: 87 ug/dL (ref 27–139)
Total Iron Binding Capacity: 256 ug/dL (ref 250–450)
UIBC: 169 ug/dL (ref 118–369)

## 2024-04-08 LAB — TSH: TSH: 1.31 u[IU]/mL (ref 0.450–4.500)

## 2024-04-08 LAB — VITAMIN B12: Vitamin B-12: 222 pg/mL — ABNORMAL LOW (ref 232–1245)

## 2024-04-08 LAB — FERRITIN: Ferritin: 270 ng/mL — ABNORMAL HIGH (ref 15–150)

## 2024-04-08 MED ORDER — CYANOCOBALAMIN 1000 MCG/ML IJ SOLN
1000.0000 ug | INTRAMUSCULAR | Status: AC
  Administered 2024-04-14 – 2024-05-05 (×4): 1000 ug via INTRAMUSCULAR

## 2024-04-08 MED ORDER — CYANOCOBALAMIN 1000 MCG/ML IJ SOLN
1000.0000 ug | INTRAMUSCULAR | Status: AC
  Administered 2024-07-14 – 2024-08-31 (×2): 1000 ug via INTRAMUSCULAR

## 2024-04-13 ENCOUNTER — Encounter

## 2024-04-14 ENCOUNTER — Ambulatory Visit (INDEPENDENT_AMBULATORY_CARE_PROVIDER_SITE_OTHER)

## 2024-04-14 DIAGNOSIS — E538 Deficiency of other specified B group vitamins: Secondary | ICD-10-CM

## 2024-04-14 DIAGNOSIS — M6281 Muscle weakness (generalized): Secondary | ICD-10-CM | POA: Diagnosis not present

## 2024-04-14 DIAGNOSIS — M545 Low back pain, unspecified: Secondary | ICD-10-CM | POA: Diagnosis not present

## 2024-04-15 ENCOUNTER — Encounter

## 2024-04-19 DIAGNOSIS — M545 Low back pain, unspecified: Secondary | ICD-10-CM | POA: Diagnosis not present

## 2024-04-19 DIAGNOSIS — M6281 Muscle weakness (generalized): Secondary | ICD-10-CM | POA: Diagnosis not present

## 2024-04-20 ENCOUNTER — Encounter

## 2024-04-21 ENCOUNTER — Ambulatory Visit (INDEPENDENT_AMBULATORY_CARE_PROVIDER_SITE_OTHER)

## 2024-04-21 ENCOUNTER — Ambulatory Visit

## 2024-04-21 DIAGNOSIS — E538 Deficiency of other specified B group vitamins: Secondary | ICD-10-CM

## 2024-04-21 NOTE — Progress Notes (Signed)
 Patient is in office today for a nurse visit for B12 Injection. Patient Injection was given in the  Right deltoid. Patient tolerated injection well.

## 2024-04-22 ENCOUNTER — Encounter

## 2024-04-26 ENCOUNTER — Other Ambulatory Visit: Payer: Self-pay | Admitting: Family Medicine

## 2024-04-26 ENCOUNTER — Telehealth: Payer: Self-pay | Admitting: Family Medicine

## 2024-04-26 DIAGNOSIS — M6281 Muscle weakness (generalized): Secondary | ICD-10-CM | POA: Diagnosis not present

## 2024-04-26 DIAGNOSIS — N1831 Hypertensive chronic kidney disease with stage 1 through stage 4 chronic kidney disease, or unspecified chronic kidney disease: Secondary | ICD-10-CM

## 2024-04-26 DIAGNOSIS — M545 Low back pain, unspecified: Secondary | ICD-10-CM | POA: Diagnosis not present

## 2024-04-26 NOTE — Telephone Encounter (Signed)
 Copied from CRM [id number]. Topic: Medicare AWV >> Apr 26, 2024 10:03 AM Nathanel DEL wrote: Reason for CRM: LVM 04/26/2024 reminder call for AWV appt on Tuesday 04/27/2024 to confirm awv.

## 2024-04-27 ENCOUNTER — Ambulatory Visit (INDEPENDENT_AMBULATORY_CARE_PROVIDER_SITE_OTHER): Admitting: Emergency Medicine

## 2024-04-27 VITALS — Ht 62.0 in | Wt 152.0 lb

## 2024-04-27 DIAGNOSIS — Z0000 Encounter for general adult medical examination without abnormal findings: Secondary | ICD-10-CM

## 2024-04-27 NOTE — Telephone Encounter (Signed)
 Requested Prescriptions  Refused Prescriptions Disp Refills   lisinopril  (ZESTRIL ) 30 MG tablet [Pharmacy Med Name: LISINOPRIL  TAB 30MG ] 90 tablet 1    Sig: TAKE 1 TABLET DAILY     Cardiovascular:  ACE Inhibitors Failed - 04/27/2024  2:21 PM      Failed - Cr in normal range and within 180 days    Creatinine, Ser  Date Value Ref Range Status  04/07/2024 1.02 (H) 0.57 - 1.00 mg/dL Final         Passed - K in normal range and within 180 days    Potassium  Date Value Ref Range Status  04/07/2024 4.3 3.5 - 5.2 mmol/L Final         Passed - Patient is not pregnant      Passed - Last BP in normal range    BP Readings from Last 1 Encounters:  04/07/24 116/76         Passed - Valid encounter within last 6 months    Recent Outpatient Visits           2 weeks ago Essential hypertension   [place] [facility] [place], Megan P, DO   5 months ago 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

## 2024-04-27 NOTE — Patient Instructions (Signed)
 Ms. Glymph,  Thank you for taking the time for your Medicare Wellness Visit. I appreciate your continued commitment to your health goals. Please review the care plan we discussed, and feel free to reach out if I can assist you further.  Medicare recommends these wellness visits once per year to help you and your care team stay ahead of potential health issues. These visits are designed to focus on prevention, allowing your provider to concentrate on managing your acute and chronic conditions during your regular appointments.  Please note that Annual Wellness Visits do not include a physical exam. Some assessments may be limited, especially if the visit was conducted virtually. If needed, we may recommend a separate in-person follow-up with your provider.  Ongoing Care Seeing your primary care provider every 3 to 6 months helps us  monitor your health and provide consistent, personalized care.   Referrals If a referral was made during today's visit and you haven't received any updates within two weeks, please contact the referred provider directly to check on the status.  Recommended Screenings: Get the flu vaccine at your convenience.  Health Maintenance  Topic Date Due   Zoster (Shingles) Vaccine (2 of 2) 02/12/2024   Flu Shot  03/12/2024   Complete foot exam   06/19/2024   Breast Cancer Screening  08/24/2024   Eye exam for diabetics  09/01/2024   Yearly kidney health urinalysis for diabetes  10/02/2024   Hemoglobin A1C  10/08/2024   Yearly kidney function blood test for diabetes  04/07/2025   Medicare Annual Wellness Visit  04/27/2025   Colon Cancer Screening  08/30/2025   DEXA scan (bone density measurement)  08/23/2027   DTaP/Tdap/Td vaccine (6 - Td or Tdap) 11/29/2030   Pneumococcal Vaccine for age over 73  Completed   Hepatitis C Screening  Completed   HPV Vaccine  Aged Out   Meningitis B Vaccine  Aged Out   COVID-19 Vaccine  Discontinued       04/27/2024    1:23 PM   Advanced Directives  Does Patient Have a Medical Advance Directive? No  Would patient like information on creating a medical advance directive? No - Patient declined   Advance Care Planning is important because it: Ensures you receive medical care that aligns with your values, goals, and preferences. Provides guidance to your family and loved ones, reducing the emotional burden of decision-making during critical moments.  Vision: Annual vision screenings are recommended for early detection of glaucoma, cataracts, and diabetic retinopathy. These exams can also reveal signs of chronic conditions such as diabetes and high blood pressure.  Dental: Annual dental screenings help detect early signs of oral cancer, gum disease, and other conditions linked to overall health, including heart disease and diabetes.  Please see the attached documents for additional preventive care recommendations.   Fall Prevention in the Home, Adult Falls can cause injuries and affect people of all ages. There are many simple things that you can do to make your home safe and to help prevent falls. If you need it, ask for help making these changes. What actions can I take to prevent falls? General information Use good lighting in all rooms. Make sure to: Replace any light bulbs that burn out. Turn on lights if it is dark and use night-lights. Keep items that you use often in easy-to-reach places. Lower the shelves around your home if needed. Move furniture so that there are clear paths around it. Do not keep throw rugs or other things on  the floor that can make you trip. If any of your floors are uneven, fix them. Add color or contrast paint or tape to clearly mark and help you see: Grab bars or handrails. First and last steps of staircases. Where the edge of each step is. If you use a ladder or stepladder: Make sure that it is fully opened. Do not climb a closed ladder. Make sure the sides of the ladder are  locked in place. Have someone hold the ladder while you use it. Know where your pets are as you move through your home. What can I do in the bathroom?     Keep the floor dry. Clean up any water that is on the floor right away. Remove soap buildup in the bathtub or shower. Buildup makes bathtubs and showers slippery. Use non-skid mats or decals on the floor of the bathtub or shower. Attach bath mats securely with double-sided, non-slip rug tape. If you need to sit down while you are in the shower, use a non-slip stool. Install grab bars by the toilet and in the bathtub and shower. Do not use towel bars as grab bars. What can I do in the bedroom? Make sure that you have a light by your bed that is easy to reach. Do not use any sheets or blankets on your bed that hang to the floor. Have a firm bench or chair with side arms that you can use for support when you get dressed. What can I do in the kitchen? Clean up any spills right away. If you need to reach something above you, use a sturdy step stool that has a grab bar. Keep electrical cables out of the way. Do not use floor polish or wax that makes floors slippery. What can I do with my stairs? Do not leave anything on the stairs. Make sure that you have a light switch at the top and the bottom of the stairs. Have them installed if you do not have them. Make sure that there are handrails on both sides of the stairs. Fix handrails that are broken or loose. Make sure that handrails are as long as the staircases. Install non-slip stair treads on all stairs in your home if they do not have carpet. Avoid having throw rugs at the top or bottom of stairs, or secure the rugs with carpet tape to prevent them from moving. Choose a carpet design that does not hide the edge of steps on the stairs. Make sure that carpet is firmly attached to the stairs. Fix any carpet that is loose or worn. What can I do on the outside of my home? Use bright outdoor  lighting. Repair the edges of walkways and driveways and fix any cracks. Clear paths of anything that can make you trip, such as tools or rocks. Add color or contrast paint or tape to clearly mark and help you see high doorway thresholds. Trim any bushes or trees on the main path into your home. Check that handrails are securely fastened and in good repair. Both sides of all steps should have handrails. Install guardrails along the edges of any raised decks or porches. Have leaves, snow, and ice cleared regularly. Use sand, salt, or ice melt on walkways during winter months if you live where there is ice and snow. In the garage, clean up any spills right away, including grease or oil spills. What other actions can I take? Review your medicines with your health care provider. Some medicines can make  you confused or feel dizzy. This can increase your chance of falling. Wear closed-toe shoes that fit well and support your feet. Wear shoes that have rubber soles and low heels. Use a cane, walker, scooter, or crutches that help you move around if needed. Talk with your provider about other ways that you can decrease your risk of falls. This may include seeing a physical therapist to learn to do exercises to improve movement and strength. Where to find more information Centers for Disease Control and Prevention, STEADI: TonerPromos.no [employer] on Aging: BaseRingTones.pl National Institute on Aging: BaseRingTones.pl Contact a health care provider if: You are afraid of falling at home. You feel weak, drowsy, or dizzy at home. You fall at home. Get help right away if you: Lose consciousness or have trouble moving after a fall. Have a fall that causes a head injury. These symptoms may be an emergency. Get help right away. Call 911. Do not wait to see if the symptoms will go away. Do not drive yourself to the hospital. This information is not intended to replace advice given to you by your health care  provider. Make sure you discuss any questions you have with your health care provider. Document Revised: 04/01/2022 Document Reviewed: 04/01/2022 Elsevier Patient Education  2024 [employer].

## 2024-04-27 NOTE — Progress Notes (Signed)
 Subjective:   Teresa [name] is a 78 y.o. who presents for a Medicare Wellness preventive visit.  As a reminder, Annual Wellness Visits don't include a physical exam, and some assessments may be limited, especially if this visit is performed virtually. We may recommend an in-person follow-up visit with your provider if needed.  Visit Complete: Virtual I connected with  [name] on 04/27/24 by a audio enabled telemedicine application and verified that I am speaking with the correct person using two identifiers.  Patient Location: Home  Provider Location: Home Office  I discussed the limitations of evaluation and management by telemedicine. The patient expressed understanding and agreed to proceed.  Vital Signs: Because this visit was a virtual/telehealth visit, some criteria may be missing or patient reported. Any vitals not documented were not able to be obtained and vitals that have been documented are patient reported.  VideoDeclined- This patient declined Librarian, academic. Therefore the visit was completed with audio only.  Persons Participating in Visit: Patient.  AWV Questionnaire: No: Patient Medicare AWV questionnaire was not completed prior to this visit.  Cardiac Risk Factors include: advanced age (>78men, >36 women);hypertension;diabetes mellitus;dyslipidemia     Objective:    Today's Vitals   04/27/24 1309 04/27/24 1310  Weight: 152 lb (68.9 kg)   Height: 5' 2 (1.575 m)   PainSc:  5    Body mass index is 27.8 kg/m.     04/27/2024    1:23 PM 11/26/2023    9:09 AM 04/22/2023    1:02 PM 03/10/2023    9:34 AM 03/20/2022    9:43 AM 03/08/2022    8:41 AM 09/03/2021    1:58 PM  Advanced Directives  Does Patient Have a Medical Advance Directive? No No No Yes No No No  Would patient like information on creating a medical advance directive? No - Patient declined No - Patient declined Yes (MAU/Ambulatory/Procedural Areas -  Information given)  No - Patient declined No - Patient declined No - Patient declined    Current Medications (verified) Outpatient Encounter Medications as of 04/27/2024  Medication Sig   acetaminophen  (TYLENOL ) 650 MG CR tablet Take 1,300 mg by mouth every 8 (eight) hours as needed for pain (back pain).   ASPIRIN 81 PO Take by mouth daily.   blood glucose meter kit and supplies Dispense based on patient and insurance preference. Use up to four times daily as directed. (FOR ICD-9 250.00, 250.01).   Blood Glucose Monitoring Suppl (ONETOUCH VERIO) w/Device KIT 1 each by Does not apply route daily.   chlorthalidone  (HYGROTON ) 25 MG tablet Take 1 tablet (25 mg total) by mouth daily.   clobetasol  ointment (TEMOVATE ) 0.05 % Apply topically 2 (two) times daily. (Patient taking differently: Apply topically 2 (two) times daily. PRN)   ferrous sulfate  (SV IRON ) 325 (65 FE) MG tablet Take 1 tablet (325 mg total) by mouth daily.   folic acid  (FOLVITE ) 800 MCG tablet Take 400 mcg by mouth daily.   glucose blood (ONETOUCH VERIO) test strip USE AS DIRECTED   glucose blood test strip 1 each by Other route 2 (two) times daily. Use as instructed, Dx: E11.22   levothyroxine  (SYNTHROID ) 50 MCG tablet Take 1 tablet (50 mcg total) by mouth daily.   lisinopril  (ZESTRIL ) 30 MG tablet Take 1 tablet (30 mg total) by mouth daily.   metFORMIN  (GLUCOPHAGE -XR) 500 MG 24 hr tablet TAKE 2 TABLETS TWICE A DAY   OneTouch Delica Lancets 33G MISC 1  each by Does not apply route daily.   rosuvastatin  (CRESTOR ) 10 MG tablet Take 1 tablet (10 mg total) by mouth daily.   gabapentin (NEURONTIN) 100 MG capsule 1 po qHS (Patient not taking: Reported on 04/27/2024)   vitamin B-12 (CYANOCOBALAMIN ) 1000 MCG tablet Take 1,000 mcg by mouth daily. (Patient not taking: Reported on 04/27/2024)   Facility-Administered Encounter Medications as of 04/27/2024  Medication   [START ON 05/06/2024] cyanocobalamin  (VITAMIN B12) injection 1,000 mcg    cyanocobalamin  (VITAMIN B12) injection 1,000 mcg    Allergies (verified) Amlodipine , Atorvastatin , and Evista [raloxifene]   History: Past Medical History:  Diagnosis Date   Diabetes mellitus without complication (HCC)    Heart murmur    Hyperlipidemia    Hypertension    Hypothyroidism    Lichen sclerosus    Obesity    Osteopenia    Stroke [facility])    Thyroid  disease    Past Surgical History:  Procedure Laterality Date   carpel tunnel     COLONOSCOPY WITH PROPOFOL  N/A 08/30/2020   Procedure: COLONOSCOPY WITH PROPOFOL ;  Surgeon: Janalyn Keene NOVAK, MD;  Location: [facility] ENDOSCOPY;  Service: Gastroenterology;  Laterality: N/A;   ROTATOR CUFF REPAIR Left    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     Family History  Problem Relation Age of Onset   Hypertension Mother    Thyroid  disease Mother    Glaucoma Mother    Coronary artery disease Mother    Stroke Father    Diabetes Brother    Hypertension Maternal Grandfather    Breast cancer Neg Hx    Social History   Socioeconomic History   Marital status: Married    Spouse name: Lamar   Number of children: 1   Years of education: Not on file   Highest education level: High school graduate  Occupational History   Occupation: retired  Tobacco Use   Smoking status: Former    Current packs/day: 0.00    Average packs/day: 0.3 packs/day for 2.4 years (0.6 ttl pk-yrs)    Types: Cigarettes    Start date: 1964    Quit date: 01/1965    Years since quitting: 59.3   Smokeless tobacco: Never  Vaping Use   Vaping status: Never Used  Substance and Sexual Activity   Alcohol use: No    Alcohol/week: 0.0 standard drinks of alcohol   Drug use: No   Sexual activity: Never  Other Topics Concern   Not on file  Social History Narrative   Not on file   Social Drivers of Health   Financial Resource Strain: Low Risk  (04/27/2024)   Overall Financial Resource Strain (CARDIA)    Difficulty of Paying Living Expenses: Not hard at all  Food  Insecurity: No Food Insecurity (04/27/2024)   Hunger Vital Sign    Worried About Running Out of Food in the Last Year: Never true    Ran Out of Food in the Last Year: Never true  Transportation Needs: No Transportation Needs (04/27/2024)   PRAPARE - Administrator, Civil Service (Medical): No    Lack of Transportation (Non-Medical): No  Physical Activity: Insufficiently Active (04/27/2024)   Exercise Vital Sign    Days of Exercise per Week: 7 days    Minutes of Exercise per Session: 20 min  Stress: No Stress Concern Present (04/27/2024)   [employer] of Occupational Health - Occupational Stress Questionnaire    Feeling of Stress: Only a little  Social Connections: Socially Integrated (04/27/2024)  Social Advertising account executive    Frequency of Communication with Friends and Family: More than three times a week    Frequency of Social Gatherings with Friends and Family: More than three times a week    Attends Religious Services: More than 4 times per year    Active Member of [employer] or Organizations: Yes    Attends Engineer, structural: More than 4 times per year    Marital Status: Married    Tobacco Counseling Counseling given: Not Answered    Clinical Intake:  Pre-visit preparation completed: Yes  Pain : 0-10 Pain Score: 5  Pain Type: Chronic pain Pain Location: Back Pain Descriptors / Indicators: Aching     BMI - recorded: 27.8 Nutritional Status: BMI 25 -29 Overweight Nutritional Risks: Nausea/ vomitting/ diarrhea (this morning) Diabetes: Yes CBG done?: No (FBS 98 per patient) Did pt. bring in CBG monitor from home?: No  Lab Results  Component Value Date   HGBA1C 6.5 (H) 04/07/2024   HGBA1C 6.0 (H) 10/03/2023   HGBA1C 6.7 (H) 06/20/2023     How often do you need to have someone help you when you read instructions, pamphlets, or other written materials from your doctor or pharmacy?: 1 - Never  Interpreter Needed?: No  Information  entered by :: Vina Ned, CMA   Activities of Daily Living     04/27/2024    1:12 PM  In your present state of health, do you have any difficulty performing the following activities:  Hearing? 0  Vision? 0  Difficulty concentrating or making decisions? 1  Walking or climbing stairs? 0  Dressing or bathing? 0  Doing errands, shopping? 0  Preparing Food and eating ? N  Using the Toilet? N  In the past six months, have you accidently leaked urine? N  Do you have problems with loss of bowel control? N  Managing your Medications? N  Managing your Finances? N  Housekeeping or managing your Housekeeping? N    Patient Care Team: Vicci Duwaine SQUIBB, DO as PCP - General (Family Medicine) Avanell Katz, MD as Referring Physician (Physical Medicine and Rehabilitation) Melanee Annah BROCKS, MD as Consulting Physician (Hematology and Oncology) Dingeldein, Steven, MD (Ophthalmology) Therapy, [facility] (Physical Therapy)  I have updated your Care Teams any recent Medical Services you may have received from other providers in the past year.     Assessment:   This is a routine wellness examination for [place].  Hearing/Vision screen Hearing Screening - Comments:: Denies hearing loss  Vision Screening - Comments:: Gets DM eye exams, Dr. Lenn Jacobs [place]   Goals Addressed             This Visit's Progress    Patient Stated       Continue physical therapy and get rid of back pain       Depression Screen     04/27/2024    1:20 PM 11/03/2023    8:53 AM 10/03/2023    9:27 AM 04/22/2023   12:59 PM 03/24/2023    1:28 PM 03/24/2023    1:05 PM 03/22/2022   11:22 AM  PHQ 2/9 Scores  PHQ - 2 Score 0 0  0 0  0  PHQ- 9 Score 0 0  0   2  Exception Documentation   Patient refusal   Patient refusal     Fall Risk     04/27/2024    1:24 PM 11/03/2023    8:53 AM 10/03/2023  9:27 AM 04/22/2023    1:04 PM 03/24/2023    1:28 PM  Fall Risk   Falls in the past year? 0 0 0 0 0   Number falls in past yr: 0 0 0 0 0  Injury with Fall? 0 0 0 0 0  Risk for fall due to : No Fall Risks No Fall Risks No Fall Risks No Fall Risks   Follow up Falls evaluation completed Falls evaluation completed Falls evaluation completed Falls prevention discussed Falls evaluation completed    MEDICARE RISK AT HOME:  Medicare Risk at Home Any stairs in or around the home?: Yes (2 steps and a ramp) If so, are there any without handrails?: No Home free of loose throw rugs in walkways, pet beds, electrical cords, etc?: No Adequate lighting in your home to reduce risk of falls?: Yes Life alert?: No Use of a cane, walker or w/c?: No Grab bars in the bathroom?: Yes Shower chair or bench in shower?: No Elevated toilet seat or a handicapped toilet?: Yes  TIMED UP AND GO:  Was the test performed?  No  Cognitive Function: 6CIT completed        04/27/2024    1:25 PM 04/22/2023    1:05 PM 03/24/2023    1:31 PM 03/20/2022    9:45 AM 03/19/2021    9:52 AM  6CIT Screen  What Year? 0 points 0 points 0 points 0 points 0 points  What month? 0 points 0 points 0 points 0 points 0 points  What time? 0 points 0 points 0 points 0 points 3 points  Count back from 20 0 points 0 points 0 points 0 points 0 points  Months in reverse 0 points 0 points 2 points 2 points 0 points  Repeat phrase 6 points 4 points 2 points 2 points 10 points  Total Score 6 points 4 points 4 points 4 points 13 points    Immunizations Immunization History  Administered Date(s) Administered   Fluad Quad(high Dose 65+) 05/20/2019, 05/02/2020, 05/22/2021, 05/09/2022   Fluad Trivalent(High Dose 65+) 06/20/2023   INFLUENZA, HIGH DOSE SEASONAL PF 07/10/2016, 04/18/2017, 05/26/2018   Influenza,inj,Quad PF,6+ Mos 07/04/2015   Influenza-Unspecified 07/04/2015   Moderna Sars-Covid-2 Vaccination 10/06/2019, 11/03/2019, 06/07/2020   Pneumococcal Conjugate-13 04/13/2014   Pneumococcal Polysaccharide-23 10/14/2014   Td 03/25/2003,  11/28/2020   Td (Adult), 2 Lf Tetanus Toxid, Preservative Free 03/25/2003, 11/28/2020   Tdap 10/05/2010   Zoster Recombinant(Shingrix) 12/18/2023   Zoster, Live 09/10/2006    Screening Tests Health Maintenance  Topic Date Due   Zoster Vaccines- Shingrix (2 of 2) 02/12/2024   Influenza Vaccine  03/12/2024   FOOT EXAM  06/19/2024   Mammogram  08/24/2024   OPHTHALMOLOGY EXAM  09/01/2024   Diabetic kidney evaluation - Urine ACR  10/02/2024   HEMOGLOBIN A1C  10/08/2024   Diabetic kidney evaluation - eGFR measurement  04/07/2025   Medicare Annual Wellness (AWV)  04/27/2025   Colonoscopy  08/30/2025   DEXA SCAN  08/23/2027   DTaP/Tdap/Td (6 - Td or Tdap) 11/29/2030   Pneumococcal Vaccine: 50+ Years  Completed   Hepatitis C Screening  Completed   HPV VACCINES  Aged Out   Meningococcal B Vaccine  Aged Out   COVID-19 Vaccine  Discontinued    Health Maintenance Items Addressed: See Nurse Notes at the end of this note  Additional Screening:  Vision Screening: Recommended annual ophthalmology exams for early detection of glaucoma and other disorders of the eye. Is the patient up  to date with their annual eye exam?  Yes  Who is the provider or what is the name of the office in which the patient attends annual eye exams? Dr. Dingeldein @ [facility]  Dental Screening: Recommended annual dental exams for proper oral hygiene  Community Resource Referral / Chronic Care Management: CRR required this visit?  No   CCM required this visit?  No   Plan:    I have personally reviewed and noted the following in the patient's chart:   Medical and social history Use of alcohol, tobacco or illicit drugs  Current medications and supplements including opioid prescriptions. Patient is not currently taking opioid prescriptions. Functional ability and status Nutritional status Physical activity Advanced directives List of other physicians Hospitalizations, surgeries, and ER  visits in previous 12 months Vitals Screenings to include cognitive, depression, and falls Referrals and appointments  In addition, I have reviewed and discussed with patient certain preventive protocols, quality metrics, and best practice recommendations. A written personalized care plan for preventive services as well as general preventive health recommendations were provided to patient.   Vina Ned, CMA   04/27/2024   After Visit Summary: (MyChart) Due to this being a telephonic visit, the after visit summary with patients personalized plan was offered to patient via MyChart   Notes:  6 CIT Score - 6 FBS this morning 98 per patient Wants to get flu vaccine tomorrow when she gets her Vit B12 injection Declined DM & Nutrition education referral Declined Covid Vaccine

## 2024-04-28 ENCOUNTER — Ambulatory Visit (INDEPENDENT_AMBULATORY_CARE_PROVIDER_SITE_OTHER)

## 2024-04-28 DIAGNOSIS — E538 Deficiency of other specified B group vitamins: Secondary | ICD-10-CM | POA: Diagnosis not present

## 2024-04-28 DIAGNOSIS — Z23 Encounter for immunization: Secondary | ICD-10-CM

## 2024-04-28 DIAGNOSIS — M6281 Muscle weakness (generalized): Secondary | ICD-10-CM | POA: Diagnosis not present

## 2024-04-28 DIAGNOSIS — M545 Low back pain, unspecified: Secondary | ICD-10-CM | POA: Diagnosis not present

## 2024-04-29 ENCOUNTER — Encounter

## 2024-05-03 DIAGNOSIS — M545 Low back pain, unspecified: Secondary | ICD-10-CM | POA: Diagnosis not present

## 2024-05-03 DIAGNOSIS — M6281 Muscle weakness (generalized): Secondary | ICD-10-CM | POA: Diagnosis not present

## 2024-05-04 ENCOUNTER — Encounter

## 2024-05-05 ENCOUNTER — Ambulatory Visit

## 2024-05-05 DIAGNOSIS — M6281 Muscle weakness (generalized): Secondary | ICD-10-CM | POA: Diagnosis not present

## 2024-05-05 DIAGNOSIS — E538 Deficiency of other specified B group vitamins: Secondary | ICD-10-CM | POA: Diagnosis not present

## 2024-05-05 DIAGNOSIS — M545 Low back pain, unspecified: Secondary | ICD-10-CM | POA: Diagnosis not present

## 2024-05-05 NOTE — Progress Notes (Signed)
 Patient is in office today for a nurse visit for B12 Injection and Testosterone Injection. Patient Injection was given in the  Left deltoid. Patient tolerated injection well.

## 2024-05-06 ENCOUNTER — Encounter

## 2024-05-10 DIAGNOSIS — M48062 Spinal stenosis, lumbar region with neurogenic claudication: Secondary | ICD-10-CM | POA: Diagnosis not present

## 2024-05-10 DIAGNOSIS — M1611 Unilateral primary osteoarthritis, right hip: Secondary | ICD-10-CM | POA: Diagnosis not present

## 2024-05-10 DIAGNOSIS — M5416 Radiculopathy, lumbar region: Secondary | ICD-10-CM | POA: Diagnosis not present

## 2024-05-10 DIAGNOSIS — M545 Low back pain, unspecified: Secondary | ICD-10-CM | POA: Diagnosis not present

## 2024-05-10 DIAGNOSIS — M6281 Muscle weakness (generalized): Secondary | ICD-10-CM | POA: Diagnosis not present

## 2024-05-11 ENCOUNTER — Encounter

## 2024-05-12 DIAGNOSIS — M6281 Muscle weakness (generalized): Secondary | ICD-10-CM | POA: Diagnosis not present

## 2024-05-12 DIAGNOSIS — M545 Low back pain, unspecified: Secondary | ICD-10-CM | POA: Diagnosis not present

## 2024-05-13 ENCOUNTER — Encounter

## 2024-05-18 ENCOUNTER — Encounter

## 2024-05-19 DIAGNOSIS — M6281 Muscle weakness (generalized): Secondary | ICD-10-CM | POA: Diagnosis not present

## 2024-05-19 DIAGNOSIS — M545 Low back pain, unspecified: Secondary | ICD-10-CM | POA: Diagnosis not present

## 2024-05-20 ENCOUNTER — Encounter

## 2024-05-24 DIAGNOSIS — M6281 Muscle weakness (generalized): Secondary | ICD-10-CM | POA: Diagnosis not present

## 2024-05-24 DIAGNOSIS — M545 Low back pain, unspecified: Secondary | ICD-10-CM | POA: Diagnosis not present

## 2024-05-25 ENCOUNTER — Encounter

## 2024-05-26 DIAGNOSIS — M6281 Muscle weakness (generalized): Secondary | ICD-10-CM | POA: Diagnosis not present

## 2024-05-26 DIAGNOSIS — M545 Low back pain, unspecified: Secondary | ICD-10-CM | POA: Diagnosis not present

## 2024-05-27 ENCOUNTER — Other Ambulatory Visit

## 2024-05-27 ENCOUNTER — Encounter

## 2024-06-04 ENCOUNTER — Ambulatory Visit

## 2024-07-05 ENCOUNTER — Ambulatory Visit

## 2024-07-07 ENCOUNTER — Ambulatory Visit: Admitting: Family Medicine

## 2024-07-14 ENCOUNTER — Ambulatory Visit

## 2024-07-14 DIAGNOSIS — E538 Deficiency of other specified B group vitamins: Secondary | ICD-10-CM

## 2024-07-14 NOTE — Progress Notes (Signed)
 Patient is in office today for a nurse visit for B12 Injection. Patient Injection was given in the  Left deltoid. Patient tolerated injection well.

## 2024-07-15 ENCOUNTER — Ambulatory Visit

## 2024-07-19 ENCOUNTER — Other Ambulatory Visit

## 2024-07-19 ENCOUNTER — Telehealth: Payer: Self-pay | Admitting: Cardiology

## 2024-07-19 ENCOUNTER — Ambulatory Visit: Admitting: Family Medicine

## 2024-07-19 ENCOUNTER — Encounter: Payer: Self-pay | Admitting: Family Medicine

## 2024-07-19 VITALS — BP 124/81 | HR 89 | Temp 97.7°F | Ht 62.0 in | Wt 148.0 lb

## 2024-07-19 DIAGNOSIS — I4891 Unspecified atrial fibrillation: Secondary | ICD-10-CM

## 2024-07-19 DIAGNOSIS — R000 Tachycardia, unspecified: Secondary | ICD-10-CM

## 2024-07-19 MED ORDER — APIXABAN 5 MG PO TABS: 5.0000 mg | ORAL_TABLET | Freq: Two times a day (BID) | ORAL | 3 refills | Status: DC

## 2024-07-19 MED ORDER — METOPROLOL SUCCINATE ER 25 MG PO TB24: 25.0000 mg | ORAL_TABLET | Freq: Every day | ORAL | 1 refills | Status: DC

## 2024-07-19 NOTE — Telephone Encounter (Signed)
 Called and spoke with patient. Patient denies symptoms at this time. Patient previously scheduled to be seen in clinic on 07/21/24.

## 2024-07-19 NOTE — Telephone Encounter (Signed)
 Patient c/o Palpitations: STAT if patient c/o lightheadedness, shortness of breath, or chest pain  How long have you had palpitations/irregular HR/ Afib? Are you having the symptoms now?  [place] with [facility] calling to report new onset afib + tachycardia when seeing patient today. ECG is scanned into chart, per Meyhonia.  Are you currently experiencing lightheadedness, SOB or CP?   Do you have a history of afib (atrial fibrillation) or irregular heart rhythm?  Afib is new onset.  Have you checked your BP or HR? (document readings if available):  146/82 128 initially  124/81 89 about 15 minutes later Per Meyhonia, HR baseline is normally in the 60's   Are you experiencing any other symptoms?  Patient was SOB + had shakiness

## 2024-07-20 ENCOUNTER — Ambulatory Visit: Admitting: Family Medicine

## 2024-07-20 ENCOUNTER — Encounter: Payer: Self-pay | Admitting: Family Medicine

## 2024-07-20 ENCOUNTER — Ambulatory Visit: Payer: Self-pay | Admitting: Family Medicine

## 2024-07-20 DIAGNOSIS — I4891 Unspecified atrial fibrillation: Secondary | ICD-10-CM | POA: Insufficient documentation

## 2024-07-20 LAB — CBC WITH DIFFERENTIAL/PLATELET
Basophils Absolute: 0.1 x10E3/uL (ref 0.0–0.2)
Basos: 1 %
EOS (ABSOLUTE): 0.2 x10E3/uL (ref 0.0–0.4)
Eos: 2 %
Hematocrit: 40.7 % (ref 34.0–46.6)
Hemoglobin: 13.1 g/dL (ref 11.1–15.9)
Immature Grans (Abs): 0 x10E3/uL (ref 0.0–0.1)
Immature Granulocytes: 0 %
Lymphocytes Absolute: 4.1 x10E3/uL — ABNORMAL HIGH (ref 0.7–3.1)
Lymphs: 36 %
MCH: 31.2 pg (ref 26.6–33.0)
MCHC: 32.2 g/dL (ref 31.5–35.7)
MCV: 97 fL (ref 79–97)
Monocytes Absolute: 0.9 x10E3/uL (ref 0.1–0.9)
Monocytes: 8 %
Neutrophils Absolute: 6.1 x10E3/uL (ref 1.4–7.0)
Neutrophils: 53 %
Platelets: 314 x10E3/uL (ref 150–450)
RBC: 4.2 x10E6/uL (ref 3.77–5.28)
RDW: 11.8 % (ref 11.7–15.4)
WBC: 11.3 x10E3/uL — ABNORMAL HIGH (ref 3.4–10.8)

## 2024-07-20 LAB — TSH: TSH: 2.53 u[IU]/mL (ref 0.450–4.500)

## 2024-07-20 LAB — COMPREHENSIVE METABOLIC PANEL WITH GFR
ALT: 14 IU/L (ref 0–32)
AST: 12 IU/L (ref 0–40)
Albumin: 4.5 g/dL (ref 3.8–4.8)
Alkaline Phosphatase: 89 IU/L (ref 49–135)
BUN/Creatinine Ratio: 20 (ref 12–28)
BUN: 28 mg/dL — ABNORMAL HIGH (ref 8–27)
Bilirubin Total: 0.3 mg/dL (ref 0.0–1.2)
CO2: 21 mmol/L (ref 20–29)
Calcium: 10.1 mg/dL (ref 8.7–10.3)
Chloride: 107 mmol/L — ABNORMAL HIGH (ref 96–106)
Creatinine, Ser: 1.41 mg/dL — ABNORMAL HIGH (ref 0.57–1.00)
Globulin, Total: 2.6 g/dL (ref 1.5–4.5)
Glucose: 137 mg/dL — ABNORMAL HIGH (ref 70–99)
Potassium: 4.7 mmol/L (ref 3.5–5.2)
Sodium: 142 mmol/L (ref 134–144)
Total Protein: 7.1 g/dL (ref 6.0–8.5)
eGFR: 38 mL/min/1.73 — ABNORMAL LOW (ref >59)

## 2024-07-20 NOTE — Progress Notes (Unsigned)
 Cardiology Office Note    Date:  07/20/2024   ID:  Teresa [name], DOB [birth year], MRN [id number]  PCP:  Louder Duwaine SQUIBB, DO  Cardiologist:  None  Electrophysiologist:  None   Chief Complaint: ***  History of Present Illness:   Teresa [name] is a 78 y.o. female with history of paroxysmal atrial tachycardia, bradycardia, hypertension, hyperlipidemia, type 2 diabetes, CVA, hypothyroidism, and obesity who presents for***.    Patient was evaluated by Dr. Mady 04/08/2018 after noted to be bradycardic with her PCP.  She reported a long history of being told she had low heart rate in the past.  She had appropriate chronotropic competence.  She was noted to have a systolic murmur and subsequent echo 04/2018 showed EF 60 to 65% with G2 DD and no significant valvular abnormalities.  Patient was evaluated by Dr. Darliss 02/2020 after referral by PCP for irregular heart rhythm.  EKG during visit showed normal sinus rhythm but on physical exam, heart rate was noted to be irregular.  Long-term monitor showed predominantly sinus rhythm with 1 run of NSVT lasting 6 beats, occasional SVT, and PACs at a 3.1% burden.  Echo 04/2020 showed EF 55 to 60%, G2 DD, moderately elevated pulmonary artery systolic pressure, and no significant valvular abnormalities.   Patient was evaluated by Dr. Anner 09/04/2023 for evaluation of bradycardia.  Patient was overall feeling well but did report fatigue and lack of motivation to start activities.  Blood pressure was noted to be mildly elevated, possibly due to recently taking over-the-counter cold medications.  No further testing or medication changes were indicated at that time.  ZIO monitor ordered by PCP 12/2022 for palpitations showed predominantly sinus rhythm with 126 episodes of NSVT up to 20 seconds with rare PACs/PVCs and no evidence of sustained arrhythmia.  Patient was seen by her PCP 07/19/2024 and reported anxiety with feeling of heart racing since that  morning.  She also noted shortness of breath, tiredness, and shakiness.  Symptoms came on suddenly.  EKG was obtained and revealed atrial fibrillation.  She was started on metoprolol  succinate 25 mg daily and Eliquis  5 mg twice daily.  She was instructed to follow-up with cardiology.  ***  Labs independently reviewed: 07/19/2024-Hgb 13.1, HCT 40.7, platelets 314, BUN 28, creatinine 1.41, sodium 142, potassium 4.7, normal LFTs, TSH WNL 03/2024-TC 125, TG 73, HDL 52, LDL 58, A1c 6.5  Objective   Past Medical History:  Diagnosis Date   Diabetes mellitus without complication (HCC)    Heart murmur    Hyperlipidemia    Hypertension    Hypothyroidism    Lichen sclerosus    Obesity    Osteopenia    Stroke [facility])    Thyroid  disease     Current Medications: No outpatient medications have been marked as taking for the 07/21/24 encounter (Appointment) with Lorene Lesley CROME, PA-C.   Current Facility-Administered Medications for the 07/21/24 encounter (Appointment) with Lorene Lesley CROME, PA-C  Medication   cyanocobalamin  (VITAMIN B12) injection 1,000 mcg    Allergies:   Amlodipine , Atorvastatin , and Evista [raloxifene]   Social History   Socioeconomic History   Marital status: Married    Spouse name: Lamar   Number of children: 1   Years of education: Not on file   Highest education level: High school graduate  Occupational History   Occupation: retired  Tobacco Use   Smoking status: Former    Current packs/day: 0.00    Average packs/day: 0.3 packs/day for 2.4  years (0.6 ttl pk-yrs)    Types: Cigarettes    Start date: 14    Quit date: 01/1965    Years since quitting: 60.5   Smokeless tobacco: Never  Vaping Use   Vaping status: Never Used  Substance and Sexual Activity   Alcohol use: No    Alcohol/week: 0.0 standard drinks of alcohol   Drug use: No   Sexual activity: Never  Other Topics Concern   Not on file  Social History Narrative   Not on file   Social Drivers of  Health   Financial Resource Strain: Low Risk  (04/27/2024)   Overall Financial Resource Strain (CARDIA)    Difficulty of Paying Living Expenses: Not hard at all  Food Insecurity: No Food Insecurity (04/27/2024)   Hunger Vital Sign    Worried About Running Out of Food in the Last Year: Never true    Ran Out of Food in the Last Year: Never true  Transportation Needs: No Transportation Needs (04/27/2024)   PRAPARE - Administrator, Civil Service (Medical): No    Lack of Transportation (Non-Medical): No  Physical Activity: Insufficiently Active (04/27/2024)   Exercise Vital Sign    Days of Exercise per Week: 7 days    Minutes of Exercise per Session: 20 min  Stress: No Stress Concern Present (04/27/2024)   [employer] of Occupational Health - Occupational Stress Questionnaire    Feeling of Stress: Only a little  Social Connections: Socially Integrated (04/27/2024)   Social Connection and Isolation Panel    Frequency of Communication with Friends and Family: More than three times a week    Frequency of Social Gatherings with Friends and Family: More than three times a week    Attends Religious Services: More than 4 times per year    Active Member of [employer] or Organizations: Yes    Attends Engineer, Structural: More than 4 times per year    Marital Status: Married     Family History:  The patient's family history includes Coronary artery disease in her mother; Diabetes in her brother; Glaucoma in her mother; Hypertension in her maternal grandfather and mother; Stroke in her father; Thyroid  disease in her mother. There is no history of Breast cancer.  ROS:   12-point review of systems is negative unless otherwise noted in the HPI.  EKGs/Other Studies Reviewed:    Studies reviewed were summarized above. The additional studies were reviewed today:  12/2022 Long term monitor HR 37 - 193 bpm, average 61 bpm. 126 nonsustained SVT, longest 19.4 seconds. Rare  supraventricular and ventricular ectopy. No sustained arrhythmias. No atrial fibrillation.  04/2020 Echo complete 1. Left ventricular ejection fraction, by estimation, is 55 to 60%. The  left ventricle has normal function. The left ventricle has no regional  wall motion abnormalities. Left ventricular diastolic parameters are  consistent with Grade II diastolic  dysfunction (pseudonormalization).   2. Right ventricular systolic function is normal. The right ventricular  size is normal. There is moderately elevated pulmonary artery systolic  pressure.   3. Left atrial size was mildly dilated.   4. The mitral valve is normal in structure. No evidence of mitral valve  regurgitation. No evidence of mitral stenosis.   5. The aortic valve is normal in structure. Aortic valve regurgitation is  not visualized. No aortic stenosis is present.   6. The inferior vena cava is normal in size with greater than 50%  respiratory variability, suggesting right atrial pressure of 3  mmHg.   02/2020 Long term monitor Patient had a min HR of 38 bpm, max HR of 187 bpm, and avg HR of 63 bpm. 1 run of Ventricular Tachycardia occurred lasting 6 beats with a max rate of 129 bpm (avg 122 bpm). Occasional Supraventricular Tachycardia runs occurred. Isolated SVEs were occasional (3.1%, [id number]), SVE Couplets were rare (<1.0%, 562), and SVE Triplets were rare (<1.0%, 120).  EKG:  EKG personally reviewed by me today    PHYSICAL EXAM:    VS:  LMP  (LMP Unknown)   BMI: There is no height or weight on file to calculate BMI.  GEN: Well nourished, well developed in no acute distress NECK: No JVD; No carotid bruits CARDIAC: ***RRR, no murmurs, rubs, gallops RESPIRATORY:  Clear to auscultation without rales, wheezing or rhonchi  ABDOMEN: Soft, non-tender, non-distended EXTREMITIES:  *** No edema; No deformity  Wt Readings from Last 3 Encounters:  07/19/24 148 lb (67.1 kg)  04/27/24 152 lb (68.9 kg)  04/07/24 152 lb  (68.9 kg)                  ASSESSMENT & PLAN:   Newly diagnosed atrial fibrillation   Bradycardia   Paroxysmal atrial tachycardia   Hypertension   Hyperlipidemia   Hx stroke    {Are you ordering a CV Procedure (e.g. stress test, cath, DCCV, TEE, etc)?   Press F2        :[id number]}   Disposition: F/u with Dr. Anner or an APP in ***.   Medication Adjustments/Labs and Tests Ordered: Current medicines are reviewed at length with the patient today.  Concerns regarding medicines are outlined above. Medication changes, Labs and Tests ordered today are summarized above and listed in the Patient Instructions accessible in Encounters.   Signed, Lesley Maffucci, PA-C 07/20/2024 3:41 PM     [facility] -  [address] [place], [place] [address] [phone]

## 2024-07-20 NOTE — Assessment & Plan Note (Signed)
 Will check labs and start her on metoprolol  and eliquis . 2 weeks of eliquis  samples given today. Referral to cardiology placed today- scheduled to see them Wednesday. Call with any concerns. Recheck 2 weeks.

## 2024-07-20 NOTE — Progress Notes (Signed)
 BP 124/81 (BP Location: Left Arm, Cuff Size: Large)   Pulse 89   Temp 97.7 F (36.5 C) (Oral)   Ht 5' 2 (1.575 m)   Wt 148 lb (67.1 kg)   LMP  (LMP Unknown)   SpO2 99%   BMI 27.07 kg/m    Subjective:    Patient ID: Teresa [name], female    DOB: 07-05-1946, 78 y.o.   MRN: [id number]  HPI: Teresa [name] is a 78 y.o. female  Chief Complaint  Patient presents with   Anxiety   Tachycardia   Shortness of Breath   Has been feeling anxious and heart has been racing since this AM. She has been SOB and tired and shakey and not feeling like herself. It came on suddenly. Nothing has stopped it. No chest pain. No other symptoms.   Relevant past medical, surgical, family and social history reviewed and updated as indicated. Interim medical history since our last visit reviewed. Allergies and medications reviewed and updated.  Review of Systems  Constitutional:  Positive for fatigue. Negative for activity change, appetite change, chills, diaphoresis, fever and unexpected weight change.  HENT: Negative.    Respiratory:  Positive for shortness of breath. Negative for apnea, cough, chest tightness, wheezing and stridor.   Cardiovascular:  Positive for palpitations. Negative for chest pain and leg swelling.  Gastrointestinal: Negative.   Musculoskeletal: Negative.   Psychiatric/Behavioral:  Negative for agitation, behavioral problems, confusion, decreased concentration, dysphoric mood, hallucinations, self-injury, sleep disturbance and suicidal ideas. The patient is nervous/anxious. The patient is not hyperactive.     Per HPI unless specifically indicated above     Objective:    BP 124/81 (BP Location: Left Arm, Cuff Size: Large)   Pulse 89   Temp 97.7 F (36.5 C) (Oral)   Ht 5' 2 (1.575 m)   Wt 148 lb (67.1 kg)   LMP  (LMP Unknown)   SpO2 99%   BMI 27.07 kg/m   Wt Readings from Last 3 Encounters:  07/19/24 148 lb (67.1 kg)  04/27/24 152 lb (68.9 kg)  04/07/24 152  lb (68.9 kg)    Physical Exam Vitals and nursing note reviewed.  Constitutional:      General: She is not in acute distress.    Appearance: Normal appearance. She is not ill-appearing, toxic-appearing or diaphoretic.  HENT:     Head: Normocephalic and atraumatic.     Right Ear: External ear normal.     Left Ear: External ear normal.     Nose: Nose normal.     Mouth/Throat:     Mouth: Mucous membranes are moist.     Pharynx: Oropharynx is clear.  Eyes:     General: No scleral icterus.       Right eye: No discharge.        Left eye: No discharge.     Extraocular Movements: Extraocular movements intact.     Conjunctiva/sclera: Conjunctivae normal.     Pupils: Pupils are equal, round, and reactive to light.  Cardiovascular:     Rate and Rhythm: Tachycardia present. Rhythm irregular.     Pulses: Normal pulses.     Heart sounds: Normal heart sounds. No murmur heard.    No friction rub. No gallop.  Pulmonary:     Effort: Pulmonary effort is normal. No respiratory distress.     Breath sounds: Normal breath sounds. No stridor. No wheezing, rhonchi or rales.  Chest:     Chest wall: No tenderness.  Musculoskeletal:  General: Normal range of motion.     Cervical back: Normal range of motion and neck supple.  Skin:    General: Skin is warm and dry.     Capillary Refill: Capillary refill takes less than 2 seconds.     Coloration: Skin is not jaundiced or pale.     Findings: No bruising, erythema, lesion or rash.  Neurological:     General: No focal deficit present.     Mental Status: She is alert and oriented to person, place, and time. Mental status is at baseline.  Psychiatric:        Mood and Affect: Mood normal.        Behavior: Behavior normal.        Thought Content: Thought content normal.        Judgment: Judgment normal.     Results for orders placed or performed in visit on 07/19/24  Comprehensive metabolic panel with GFR   Collection Time: 07/19/24  4:02 PM   Result Value Ref Range   Glucose 137 (H) 70 - 99 mg/dL   BUN 28 (H) 8 - 27 mg/dL   Creatinine, Ser 8.58 (H) 0.57 - 1.00 mg/dL   eGFR 38 (L) >40 fO/fpw/8.26   BUN/Creatinine Ratio 20 12 - 28   Sodium 142 134 - 144 mmol/L   Potassium 4.7 3.5 - 5.2 mmol/L   Chloride 107 (H) 96 - 106 mmol/L   CO2 21 20 - 29 mmol/L   Calcium  10.1 8.7 - 10.3 mg/dL   Total Protein 7.1 6.0 - 8.5 g/dL   Albumin 4.5 3.8 - 4.8 g/dL   Globulin, Total 2.6 1.5 - 4.5 g/dL   Bilirubin Total 0.3 0.0 - 1.2 mg/dL   Alkaline Phosphatase 89 49 - 135 IU/L   AST 12 0 - 40 IU/L   ALT 14 0 - 32 IU/L  CBC with Differential/Platelet   Collection Time: 07/19/24  4:02 PM  Result Value Ref Range   WBC 11.3 (H) 3.4 - 10.8 x10E3/uL   RBC 4.20 3.77 - 5.28 x10E6/uL   Hemoglobin 13.1 11.1 - 15.9 g/dL   Hematocrit 59.2 65.9 - 46.6 %   MCV 97 79 - 97 fL   MCH 31.2 26.6 - 33.0 pg   MCHC 32.2 31.5 - 35.7 g/dL   RDW 88.1 88.2 - 84.5 %   Platelets 314 150 - 450 x10E3/uL   Neutrophils 53 Not Estab. %   Lymphs 36 Not Estab. %   Monocytes 8 Not Estab. %   Eos 2 Not Estab. %   Basos 1 Not Estab. %   Neutrophils Absolute 6.1 1.4 - 7.0 x10E3/uL   Lymphocytes Absolute 4.1 (H) 0.7 - 3.1 x10E3/uL   Monocytes Absolute 0.9 0.1 - 0.9 x10E3/uL   EOS (ABSOLUTE) 0.2 0.0 - 0.4 x10E3/uL   Basophils Absolute 0.1 0.0 - 0.2 x10E3/uL   Immature Granulocytes 0 Not Estab. %   Immature Grans (Abs) 0.0 0.0 - 0.1 x10E3/uL  TSH   Collection Time: 07/19/24  4:02 PM  Result Value Ref Range   TSH 2.530 0.450 - 4.500 uIU/mL      Assessment & Plan:   Problem List Items Addressed This Visit       Cardiovascular and Mediastinum   New onset atrial fibrillation (HCC) - Primary   Will check labs and start her on metoprolol  and eliquis . 2 weeks of eliquis  samples given today. Referral to cardiology placed today- scheduled to see them Wednesday. Call with any concerns. Recheck  2 weeks.       Relevant Medications   metoprolol  succinate (TOPROL -XL) 25  MG 24 hr tablet   apixaban  (ELIQUIS ) 5 MG TABS tablet   Other Relevant Orders   Comprehensive metabolic panel with GFR (Completed)   CBC with Differential/Platelet (Completed)   TSH (Completed)   Other Visit Diagnoses       Tachycardia       + Afib on EKG today.   Relevant Orders   EKG 12-Lead        Follow up plan: Return in about 2 weeks (around 08/02/2024).

## 2024-07-21 ENCOUNTER — Telehealth: Payer: Self-pay

## 2024-07-21 ENCOUNTER — Encounter: Payer: Self-pay | Admitting: Physician Assistant

## 2024-07-21 ENCOUNTER — Ambulatory Visit: Attending: Physician Assistant | Admitting: Physician Assistant

## 2024-07-21 VITALS — BP 130/80 | HR 139 | Ht 62.0 in | Wt 148.4 lb

## 2024-07-21 DIAGNOSIS — I10 Essential (primary) hypertension: Secondary | ICD-10-CM | POA: Diagnosis not present

## 2024-07-21 DIAGNOSIS — E782 Mixed hyperlipidemia: Secondary | ICD-10-CM

## 2024-07-21 DIAGNOSIS — R001 Bradycardia, unspecified: Secondary | ICD-10-CM | POA: Diagnosis not present

## 2024-07-21 DIAGNOSIS — Z8673 Personal history of transient ischemic attack (TIA), and cerebral infarction without residual deficits: Secondary | ICD-10-CM | POA: Diagnosis not present

## 2024-07-21 DIAGNOSIS — I4891 Unspecified atrial fibrillation: Secondary | ICD-10-CM

## 2024-07-21 MED ORDER — APIXABAN 5 MG PO TABS: 5.0000 mg | ORAL_TABLET | Freq: Two times a day (BID) | ORAL | 3 refills | Status: DC

## 2024-07-21 NOTE — Telephone Encounter (Signed)
 Call and spoke with patient , Per Lesley ,she want her to take  aspirin and update her orders.She only needs to be on one blood thinner and can be on the Eliquis  instead of aspirin. Patient understood , patietn also stated that the Eliquis  was $400 and she called her PCP and got some samples for the next to week, will ask about patient assistance

## 2024-07-21 NOTE — Patient Instructions (Addendum)
 Medication Instructions:  Your physician recommends the following medication changes.  START TAKING: ELIQUIS  5MG  TWICW A DAY STOP ASPRIN    *If you need a refill on your cardiac medications before your next appointment, please call your pharmacy*  Lab Work: No labs ordered today  If you have labs (blood work) drawn today and your tests are completely normal, you will receive your results only by: MyChart Message (if you have MyChart) OR A paper copy in the mail If you have any lab test that is abnormal or we need to change your treatment, we will call you to review the results.  Testing/Procedures: Your physician has requested that you have an echocardiogram. Echocardiography is a painless test that uses sound waves to create images of your heart. It provides your doctor with information about the size and shape of your heart and how well your hearts chambers and valves are working.   You may receive an ultrasound enhancing agent through an IV if needed to better visualize your heart during the echo. This procedure takes approximately one hour.  There are no restrictions for this procedure.  This will take place at [address] [facility]) [address], [place] [address]  Please note: We ask at that you not bring children with you during ultrasound (echo/ vascular) testing. Due to room size and safety concerns, children are not allowed in the ultrasound rooms during exams. Our front office staff cannot provide observation of children in our lobby area while testing is being conducted. An adult accompanying a patient to their appointment will only be allowed in the ultrasound room at the discretion of the ultrasound technician under special circumstances. We apologize for any inconvenience.   Your physician has recommended that you wear a Zio monitor.   This monitor is a medical device that records the hearts electrical activity. Doctors most often use these monitors to  diagnose arrhythmias. Arrhythmias are problems with the speed or rhythm of the heartbeat. The monitor is a small device applied to your chest. You can wear one while you do your normal daily activities. While wearing this monitor if you have any symptoms to push the button and record what you felt. Once you have worn this monitor for the period of time provider prescribed (Usually 14 days), you will return the monitor device in the postage paid box. Once it is returned they will download the data collected and provide us  with a report which the provider will then review and we will call you with those results. Important tips:  Avoid showering during the first 24 hours of wearing the monitor. Avoid excessive sweating to help maximize wear time. Do not submerge the device, no hot tubs, and no swimming pools. Keep any lotions or oils away from the patch. After 24 hours you may shower with the patch on. Take brief showers with your back facing the shower head.  Do not remove patch once it has been placed because that will interrupt data and decrease adhesive wear time. Push the button when you have any symptoms and write down what you were feeling. Once you have completed wearing your monitor, remove and place into box which has postage paid and place in your outgoing mailbox.  If for some reason you have misplaced your box then call our office and we can provide another box and/or mail it off for you.     Follow-Up: At [facility], you and your health needs are our priority.  As  part of our continuing mission to provide you with exceptional heart care, our providers are all part of one team.  This team includes your primary Cardiologist (physician) and Advanced Practice Providers or APPs (Physician Assistants and Nurse Practitioners) who all work together to provide you with the care you need, when you need it.  Your next appointment:   3 week(s)  Provider:   You may SEE one of the  following Advanced Practice Providers on your designated Care Team:   Lonni Meager, NP Lesley Maffucci, PA-C Bernardino Bring, PA-C Cadence [place], PA-C Tylene Lunch, NP Barnie Hila, NP    We recommend signing up for the patient portal called MyChart.  Sign up information is provided on this After Visit Summary.  MyChart is used to connect with patients for Virtual Visits (Telemedicine).  Patients are able to view lab/test results, encounter notes, upcoming appointments, etc.  Non-urgent messages can be sent to your provider as well.   To learn more about what you can do with MyChart, go to [email or web address].   Other Instructions     Check blood pressure and heart rate in the morning and evening after taking medications.   Goal for heart rate while in atrial fibrillation is less than 110 bpm.   If heart rate remains above 110 bpm after one week of taking metoprolol , call our office or send a MyChart message so we can adjust medications.   If your heart rate is less than 70 bpm, STOP taking metoprolol  and call our office or send MyChart message.

## 2024-07-23 ENCOUNTER — Other Ambulatory Visit (HOSPITAL_COMMUNITY): Payer: Self-pay

## 2024-07-23 ENCOUNTER — Telehealth: Payer: Self-pay | Admitting: Pharmacy Technician

## 2024-07-23 ENCOUNTER — Encounter: Payer: Self-pay | Admitting: Oncology

## 2024-07-23 NOTE — Telephone Encounter (Signed)
 Per test claim-NEXT AVAILABLE FILL DATE [id number] LAST FILL DT [id number] FILLED AT PHARMACY [facility]PHONE #[phone]   I called [facility] to see what the eliquis  copay was on her fill of 07/19/24 and they said it is 417.21 for 90 days. We would not be able to tell what her 2026 copay would be until 2026.   She does not have cardiomyopathy so she is not eligible for a grant Before she can qualify for bms assistance, she has to pay 3% of her income in that calendar year.   Dabigatran would be 24.53 for 30 days, could she be changed to that?      Sent message in another encounter

## 2024-07-28 ENCOUNTER — Ambulatory Visit

## 2024-07-28 ENCOUNTER — Telehealth: Payer: Self-pay | Admitting: Cardiology

## 2024-07-28 DIAGNOSIS — I4891 Unspecified atrial fibrillation: Secondary | ICD-10-CM

## 2024-07-28 NOTE — Telephone Encounter (Signed)
 Called patient back in regards to heart monitor. After reviewing recent office visit, looks like the zio monitor instructions were given but the order was not placed. Went ahead and placed the order for zio monitor to be delivered next week. Patient said metoprolol  is working for heart rate but rate is still in 100-110's.

## 2024-07-28 NOTE — Progress Notes (Unsigned)
 Enrolled for [employer] to mail a ZIO XT long term holter monitor to the patients address on file.   Dr. Herbie Baltimore to read.

## 2024-07-28 NOTE — Telephone Encounter (Signed)
 Patient calling about the heart monitor, she states she haven't received anything. Please advise

## 2024-07-30 ENCOUNTER — Ambulatory Visit: Admitting: Family Medicine

## 2024-07-30 ENCOUNTER — Encounter: Payer: Self-pay | Admitting: Family Medicine

## 2024-07-30 VITALS — BP 112/80 | HR 114 | Temp 97.5°F | Ht 62.0 in | Wt 148.0 lb

## 2024-07-30 DIAGNOSIS — I4891 Unspecified atrial fibrillation: Secondary | ICD-10-CM | POA: Diagnosis not present

## 2024-07-30 DIAGNOSIS — R0989 Other specified symptoms and signs involving the circulatory and respiratory systems: Secondary | ICD-10-CM | POA: Diagnosis not present

## 2024-07-30 DIAGNOSIS — I10 Essential (primary) hypertension: Secondary | ICD-10-CM | POA: Diagnosis not present

## 2024-07-30 MED ORDER — METOPROLOL SUCCINATE ER 25 MG PO TB24: 50.0000 mg | ORAL_TABLET | Freq: Every day | ORAL | 3 refills | Status: DC

## 2024-07-30 MED ORDER — METOPROLOL SUCCINATE ER 25 MG PO TB24: 25.0000 mg | ORAL_TABLET | Freq: Every day | ORAL | Status: DC

## 2024-07-30 MED ORDER — LISINOPRIL 20 MG PO TABS: 20.0000 mg | ORAL_TABLET | Freq: Every day | ORAL | 3 refills | Status: DC

## 2024-07-30 NOTE — Progress Notes (Addendum)
 "  BP 112/80   Pulse (!) 114   Temp (!) 97.5 F (36.4 C) (Oral)   Ht 5' 2 (1.575 m)   Wt 148 lb (67.1 kg)   LMP  (LMP Unknown)   SpO2 96%   BMI 27.07 kg/m    Subjective:    Patient ID: Teresa [name], female    DOB: 1946/01/22, 78 y.o.   MRN: [id number]  HPI: Teresa [name] is a 78 y.o. female  Chief Complaint  Patient presents with   Atrial Fibrillation   Sinusitis   Feeling better since starting the metoprolol , but still having episodes where her heart will race and she will get SOB and need to sit down. Has been monitoring her HR 2x a day since seeing cardiology- see scanned document. Lowest 63bpm x1, usually running 110s-120s. Tolerating her eliquis  well. No bleeding. Has not started her zio yet. Has follow up with cardiology 08/13/24.   UPPER RESPIRATORY TRACT INFECTION Duration: 2 days Worst symptom:runny nose Fever: no Cough: no Shortness of breath: no Wheezing: no Chest pain: no Chest tightness: no Chest congestion: no Nasal congestion: no Runny nose: yes Post nasal drip: no Sneezing: yes Sore throat: no Swollen glands: no Sinus pressure: no Headache: no Face pain: no Toothache: no Ear pain: no  Ear pressure: no  Eyes red/itching:no Eye drainage/crusting: no  Vomiting: no Rash: no Fatigue: no Sick contacts: no Strep contacts: no  Context: stable Recurrent sinusitis: no Relief with OTC cold/cough medications: no  Treatments attempted: none    Relevant past medical, surgical, family and social history reviewed and updated as indicated. Interim medical history since our last visit reviewed. Allergies and medications reviewed and updated.  Review of Systems  Constitutional:  Positive for fatigue. Negative for activity change, appetite change, chills, diaphoresis, fever and unexpected weight change.  HENT:  Positive for rhinorrhea and sneezing. Negative for congestion, dental problem, drooling, ear discharge, ear pain, facial swelling, hearing  loss, mouth sores, nosebleeds, postnasal drip, sinus pressure, sinus pain, sore throat, tinnitus, trouble swallowing and voice change.   Respiratory:  Positive for shortness of breath. Negative for apnea, cough, choking, chest tightness, wheezing and stridor.   Cardiovascular:  Positive for palpitations. Negative for chest pain and leg swelling.  Gastrointestinal: Negative.   Musculoskeletal: Negative.   Psychiatric/Behavioral: Negative.      Per HPI unless specifically indicated above     Objective:    BP 112/80   Pulse (!) 114   Temp (!) 97.5 F (36.4 C) (Oral)   Ht 5' 2 (1.575 m)   Wt 148 lb (67.1 kg)   LMP  (LMP Unknown)   SpO2 96%   BMI 27.07 kg/m   Wt Readings from Last 3 Encounters:  07/30/24 148 lb (67.1 kg)  07/21/24 148 lb 6 oz (67.3 kg)  07/19/24 148 lb (67.1 kg)    Physical Exam Vitals and nursing note reviewed.  Constitutional:      General: She is not in acute distress.    Appearance: Normal appearance. She is not ill-appearing, toxic-appearing or diaphoretic.  HENT:     Head: Normocephalic and atraumatic.     Right Ear: Tympanic membrane, ear canal and external ear normal. There is no impacted cerumen.     Left Ear: Tympanic membrane, ear canal and external ear normal. There is no impacted cerumen.     Nose: Rhinorrhea present. No congestion.     Mouth/Throat:     Mouth: Mucous membranes are moist.  Pharynx: Oropharynx is clear. No oropharyngeal exudate or posterior oropharyngeal erythema.  Eyes:     General: No scleral icterus.       Right eye: No discharge.        Left eye: No discharge.     Extraocular Movements: Extraocular movements intact.     Conjunctiva/sclera: Conjunctivae normal.     Pupils: Pupils are equal, round, and reactive to light.  Cardiovascular:     Rate and Rhythm: Tachycardia present. Rhythm irregular.     Pulses: Normal pulses.     Heart sounds: Normal heart sounds. No murmur heard.    No friction rub. No gallop.   Pulmonary:     Effort: Pulmonary effort is normal. No respiratory distress.     Breath sounds: Normal breath sounds. No stridor. No wheezing, rhonchi or rales.  Chest:     Chest wall: No tenderness.  Musculoskeletal:        General: Normal range of motion.     Cervical back: Normal range of motion and neck supple.  Skin:    General: Skin is warm and dry.     Capillary Refill: Capillary refill takes less than 2 seconds.     Coloration: Skin is not jaundiced or pale.     Findings: No bruising, erythema, lesion or rash.  Neurological:     General: No focal deficit present.     Mental Status: She is alert and oriented to person, place, and time. Mental status is at baseline.  Psychiatric:        Mood and Affect: Mood normal.        Behavior: Behavior normal.        Thought Content: Thought content normal.        Judgment: Judgment normal.     Results for orders placed or performed in visit on 07/19/24  Comprehensive metabolic panel with GFR   Collection Time: 07/19/24  4:02 PM  Result Value Ref Range   Glucose 137 (H) 70 - 99 mg/dL   BUN 28 (H) 8 - 27 mg/dL   Creatinine, Ser 8.58 (H) 0.57 - 1.00 mg/dL   eGFR 38 (L) >40 fO/fpw/8.26   BUN/Creatinine Ratio 20 12 - 28   Sodium 142 134 - 144 mmol/L   Potassium 4.7 3.5 - 5.2 mmol/L   Chloride 107 (H) 96 - 106 mmol/L   CO2 21 20 - 29 mmol/L   Calcium  10.1 8.7 - 10.3 mg/dL   Total Protein 7.1 6.0 - 8.5 g/dL   Albumin 4.5 3.8 - 4.8 g/dL   Globulin, Total 2.6 1.5 - 4.5 g/dL   Bilirubin Total 0.3 0.0 - 1.2 mg/dL   Alkaline Phosphatase 89 49 - 135 IU/L   AST 12 0 - 40 IU/L   ALT 14 0 - 32 IU/L  CBC with Differential/Platelet   Collection Time: 07/19/24  4:02 PM  Result Value Ref Range   WBC 11.3 (H) 3.4 - 10.8 x10E3/uL   RBC 4.20 3.77 - 5.28 x10E6/uL   Hemoglobin 13.1 11.1 - 15.9 g/dL   Hematocrit 59.2 65.9 - 46.6 %   MCV 97 79 - 97 fL   MCH 31.2 26.6 - 33.0 pg   MCHC 32.2 31.5 - 35.7 g/dL   RDW 88.1 88.2 - 84.5 %    Platelets 314 150 - 450 x10E3/uL   Neutrophils 53 Not Estab. %   Lymphs 36 Not Estab. %   Monocytes 8 Not Estab. %   Eos 2 Not Estab. %  Basos 1 Not Estab. %   Neutrophils Absolute 6.1 1.4 - 7.0 x10E3/uL   Lymphocytes Absolute 4.1 (H) 0.7 - 3.1 x10E3/uL   Monocytes Absolute 0.9 0.1 - 0.9 x10E3/uL   EOS (ABSOLUTE) 0.2 0.0 - 0.4 x10E3/uL   Basophils Absolute 0.1 0.0 - 0.2 x10E3/uL   Immature Granulocytes 0 Not Estab. %   Immature Grans (Abs) 0.0 0.0 - 0.1 x10E3/uL  TSH   Collection Time: 07/19/24  4:02 PM  Result Value Ref Range   TSH 2.530 0.450 - 4.500 uIU/mL      Assessment & Plan:   Problem List Items Addressed This Visit       Cardiovascular and Mediastinum   Essential hypertension (Chronic)   BP great, but concerned that may go low with higher dose of metoprolol . Will cut her lisinopril  to 20mg  and increase her metoprolol  to 50mg .       Relevant Medications   lisinopril  (ZESTRIL ) 20 MG tablet   metoprolol  succinate (TOPROL -XL) 25 MG 24 hr tablet   New onset atrial fibrillation (HCC) - Primary   Feeling better, but still tachycardic. HR has been consistently in the 110s-140s. Continuing monitoring BID- if HR drops to the 50s will go back to 25mg  metoprolol . Has follow up with cardiology 1/2. Will send them a copy of note from today with changes. We will plan on checking back in with her in about a month. Eliquis  quite expensive- additional samples given today. Referral to pharmacy placed.       Relevant Medications   lisinopril  (ZESTRIL ) 20 MG tablet   metoprolol  succinate (TOPROL -XL) 25 MG 24 hr tablet   Other Relevant Orders   AMB Referral [facility] Care Management   Other Visit Diagnoses       Runny nose       Will start on zyrtec at bedtime. Samples given. Call if not getting better or getting worse.        Follow up plan: Return in about 4 weeks (around 08/27/2024) for OK to double book if needed.      "

## 2024-07-30 NOTE — Patient Instructions (Signed)
 Keep taking your HR 2x a day- if it is in the 50s- cut down to 1 tablet of your metoprolol  Stop the 30mg  lisinopril  ans start the 20mg  lisinopril  to keep your blood pressure good Follow up with cardiology on 1/2 as scheduled

## 2024-07-30 NOTE — Assessment & Plan Note (Deleted)
 BP great, but concerned that may go low with higher dose of metoprolol . Will cut her lisinopril  to 20mg  and increase her metoprolol  to 50mg .

## 2024-07-30 NOTE — Assessment & Plan Note (Signed)
 Feeling better, but still tachycardic. HR has been consistently in the 110s-140s. Continuing monitoring BID- if HR drops to the 50s will go back to 25mg  metoprolol . Has follow up with cardiology 1/2. Will send them a copy of note from today with changes. We will plan on checking back in with her in about a month. Eliquis  quite expensive- additional samples given today. Referral to pharmacy placed.

## 2024-07-30 NOTE — Assessment & Plan Note (Signed)
 BP great, but concerned that may go low with higher dose of metoprolol . Will cut her lisinopril  to 20mg  and increase her metoprolol  to 50mg .

## 2024-08-03 ENCOUNTER — Telehealth: Payer: Self-pay

## 2024-08-03 ENCOUNTER — Ambulatory Visit

## 2024-08-03 NOTE — Progress Notes (Signed)
 Care Guide Pharmacy Note  08/03/2024 Name: [name] MRN: [id number] DOB: 06/01/1946  Referred By: Louder Duwaine SQUIBB, DO Reason for referral: Complex Care Management (Outreach to schedule with Pharm d )   Teresa [name] is a 78 y.o. year old female who is a primary care patient of Deone, Omahoney, DO.  COLLIN HENDLEY was referred to the pharmacist for assistance related to: Atrial Fibrillation  Successful contact was made with the patient to discuss pharmacy services including being ready for the pharmacist to call at least 5 minutes before the scheduled appointment time and to have medication bottles and any blood pressure readings ready for review. The patient agreed to meet with the pharmacist via telephone visit on (date/time).08/10/2024  Jeoffrey Buffalo , RMA     [place]  [facility] Guide  Direct Dial: [phone]  Website: [email or web address]

## 2024-08-09 ENCOUNTER — Telehealth: Payer: Self-pay | Admitting: Family Medicine

## 2024-08-09 NOTE — Telephone Encounter (Signed)
 Discussed and started on Zyrtec at last visit on 07/30/2024.

## 2024-08-09 NOTE — Telephone Encounter (Unsigned)
 Copied from CRM #[id number]. Topic: Clinical - Medication Question >> Aug 09, 2024 12:58 PM Alfonso HERO wrote: Patient calling to see if the doctor can send something for her cold over to [facility] [address], [place] - [address] [place] [address] Phone: [phone] Fax: [phone]  She says her cough is getting worse. I tried to schedule her an appt but she said she was just there a few weeks ago and just wants something called in for her.

## 2024-08-09 NOTE — Telephone Encounter (Signed)
"  She can get something over the counter, but for a prescription she will need to be seen "

## 2024-08-10 ENCOUNTER — Other Ambulatory Visit (INDEPENDENT_AMBULATORY_CARE_PROVIDER_SITE_OTHER): Payer: Self-pay

## 2024-08-10 DIAGNOSIS — I4891 Unspecified atrial fibrillation: Secondary | ICD-10-CM

## 2024-08-10 NOTE — Progress Notes (Signed)
 "  08/10/2024 Name: Teresa [name] MRN: [id number] DOB: 08-25-1945  Chief Complaint  Patient presents with   Medication Assistance   Teresa [name] is a 78 y.o. year old female who presented for a telephone visit.   They were referred to the pharmacist by their PCP for assistance in managing medication access.   Subjective:  Care Team: Primary Care Provider: Louder Duwaine SQUIBB, DO ; Next Scheduled Visit: 08/31/24 Cardiologist: Teresa [name]; Next Scheduled Visit: 08/13/24  Medication Access/Adherence  Current Pharmacy:  [facility] [address], [place] - [address] [place] [address] Phone: [phone] Fax: [phone]  [facility] - [place], [place] - [facility] AT Portal to Registered [address] [place] [address] Phone: [phone] Fax: [phone]  [id number] [place], [place] - [address] AT [facility] [address] [place] [address] Phone: [phone] Fax: [phone]  Patient reports affordability concerns with their medications: Yes  Patient reports access/transportation concerns to their pharmacy: No  Patient reports adherence concerns with their medications:  No    Atrial Fibrillation: Current medications: Rate Control: metoprolol  xl 50mg  daily Rhythm Control:  Anticoagulation Regimen: Eliquis  5mg  BID -Current blood pressure/heart rate readings: 112/80, 114 at PCP visit 12/19 -Patient unable to afford Eliquis , so she was provided with medication samples and currently has enough to last another week or so  Objective: Lab Results  Component Value Date   CREATININE 1.41 (H) 07/19/2024   BUN 28 (H) 07/19/2024   NA 142 07/19/2024   K 4.7 07/19/2024   CL 107 (H) 07/19/2024   CO2 21 07/19/2024   Medications Reviewed Today     Reviewed by Deanna Teresa LABOR, RPH (Pharmacist) on 08/10/24 at 1001  Med List Status: <None>    Medication Order Taking? Sig Documenting Provider Last Dose Status Informant  Accu-Chek Softclix Lancets lancets [id number] Yes by Other route. Use as instructed [provider]  Active   acetaminophen  (TYLENOL ) 650 MG CR tablet [id number] Yes Take 1,300 mg by mouth every 8 (eight) hours as needed for pain (back pain). [provider]  Active     Discontinued 08/10/24 0958 (Duplicate)   apixaban  (ELIQUIS ) 5 MG TABS tablet [id number] Yes Take 1 tablet (5 mg total) by mouth 2 (two) times daily. Teresa [name] CROME, PA-C  Active            Med Note [name], [name]   Tue Aug 10, 2024  9:59 AM) samples   Discontinued 08/10/24 0959 (Change in therapy)   Blood Glucose Monitoring Suppl (ACCU-CHEK GUIDE ME) w/Device KIT [id number] Yes by Does not apply route. [provider]  Active    Discontinued 08/10/24 0959 (Change in therapy)   chlorthalidone  (HYGROTON ) 25 MG tablet [id number] Yes Take 1 tablet (25 mg total) by mouth daily. Teresa, Megan P, DO  Active   clobetasol  ointment (TEMOVATE ) 0.05 % [id number]  Apply topically 2 (two) times daily. Lundeen, Megan P, DO  Active   cyanocobalamin  (VITAMIN B12) injection 1,000 mcg [id number]   Teresa Duwaine P, DO  Active   ferrous sulfate  (SV IRON ) 325 (65 FE) MG tablet [id number]  Take 1 tablet (325 mg total) by mouth daily. Lotter, Megan P, DO  Active   folic acid  (FOLVITE ) 800 MCG tablet [id number]  Take 400 mcg by mouth daily. [provider]  Active Self   Patient not taking:  Discontinued 08/10/24 1000 (Completed Course)   Glucose Blood (ACCU-CHEK GUIDE TEST VI) [id number] Yes by In Vitro route. [provider]  Active    Discontinued 08/10/24 1000 (Change in therapy)    Discontinued 08/10/24 1000 (Change in therapy)   Lancets Misc. (ACCU-CHEK SOFTCLIX LANCET DEV) KIT [id number] Yes SMARTSIG:1 device As Directed [provider]  Active   levothyroxine  (SYNTHROID ) 50 MCG tablet [id number] Yes Take 1 tablet (50  mcg total) by mouth daily. Mars, Megan P, DO  Active   lisinopril  (ZESTRIL ) 20 MG tablet [id number] Yes Take 1 tablet (20 mg total) by mouth daily. Badilla, Megan P, DO  Active   metFORMIN  (GLUCOPHAGE -XR) 500 MG 24 hr tablet [id number] Yes TAKE 2 TABLETS TWICE A DAY Mehta, Megan P, DO  Active   metoprolol  succinate (TOPROL -XL) 25 MG 24 hr tablet [id number] Yes Take 2 tablets (50 mg total) by mouth daily. Teresa Duwaine SQUIBB, DO  Active    Patient not taking:   Discontinued 08/10/24 1000 (Change in therapy)   rosuvastatin  (CRESTOR ) 10 MG tablet [id number] Yes Take 1 tablet (10 mg total) by mouth daily. Ruppel, Megan P, DO  Active   vitamin B-12 (CYANOCOBALAMIN ) 1000 MCG tablet [id number]  Take 1,000 mcg by mouth daily. [provider]  Active            Assessment/Plan:   Atrial Fibrillation: -Currently moderately controlled- metoprolol  XL increased from 25mg  to 50mg  to help lower HR at PCP visit 12/19; lisinopril  decreased to 20mg  from 30mg  to prevent hypotension -Patient does not qualify for BMS patient assistance for Eliquis  based on the programs requirement that 3% of household income must already be spent on medications for the year -Does not qualify for LIS Medicare Extra Help based on household income -Does not qualify for [employer] for copay assistance with Eliquis  (no cardiomyopathy diagnosis) -Test claim reflects Dabigatran 150mg  BID would be $24.53 copay for a 1 month supply, which patient states is affordable.  I will verify copay will still be this amount after the 1st of the year (may increase if patient has a deductible) and will follow-up with cardiology prior to her follow-up 1/2  Follow Up Plan: 1/2  Teresa [name], PharmD, DPLA    "

## 2024-08-10 NOTE — Telephone Encounter (Signed)
 Called and spoke with the patient, and she doesn't want to be seen so she will do something OTC.

## 2024-08-11 ENCOUNTER — Ambulatory Visit

## 2024-08-11 DIAGNOSIS — I4891 Unspecified atrial fibrillation: Secondary | ICD-10-CM

## 2024-08-11 NOTE — Progress Notes (Unsigned)
 "  Cardiology Office Note    Date:  08/11/2024   ID:  Teresa [name], DOB 12-20-1945, MRN [id number]  PCP:  Louder Duwaine SQUIBB, DO  Cardiologist:  Alm Clay, MD  Electrophysiologist:  None   Chief Complaint: ***  History of Present Illness:   Teresa [name] is a 78 y.o. female with history of ***    Patient was evaluated by Dr. Mady 04/08/2018 after noted to be bradycardic with her PCP.  She reported a long history of being told she had low heart rate in the past.  She had appropriate chronotropic competence.  She was noted to have a systolic murmur and subsequent echo 04/2018 showed EF 60 to 65% with G2 DD and no significant valvular abnormalities.  Patient was evaluated by Dr. Darliss 02/2020 after referral by PCP for irregular heart rhythm.  EKG during visit showed normal sinus rhythm but on physical exam, heart rate was noted to be irregular.  Long-term monitor showed predominantly sinus rhythm with 1 run of NSVT lasting 6 beats, occasional SVT, and PACs at a 3.1% burden.  Echo 04/2020 showed EF 55 to 60%, G2 DD, moderately elevated pulmonary artery systolic pressure, and no significant valvular abnormalities.   Patient was seen by her PCP 07/19/2024 and reported anxiety with feeling of heart racing since that morning.  She also noted shortness of breath, tiredness, and shakiness.  EKG was obtained and revealed atrial fibrillation.  She was started on metoprolol  succinate 25 mg daily and Eliquis  5 mg daily.  She followed up with our office 07/21/2024 reported ongoing symptoms.  She reported that she had not yet picked up metoprolol  from the pharmacy but had started taking Eliquis , as she received samples from her PCP.  2-week ZIO monitor was ordered and pending.  Echo was ordered and pending.  She was recommended to start metoprolol  as prescribed per PCP.  ***  Labs independently reviewed: 07/19/2024-Hgb 13.1, HCT 40.7, platelets 314, BUN 28, creatinine 1.41, sodium 142, potassium  4.7, normal LFTs, TSH WNL 03/2024-TC 125, TG 73, HDL 52, LDL 58, A1c 6.5  Objective   Past Medical History:  Diagnosis Date   Diabetes mellitus without complication (HCC)    Heart murmur    Hyperlipidemia    Hypertension    Hypothyroidism    Lichen sclerosus    Obesity    Osteopenia    Stroke (HCC)    Thyroid  disease     Current Medications: Active Medications[1]  Allergies:   Amlodipine , Atorvastatin , and Evista [raloxifene]   Social History   Socioeconomic History   Marital status: Married    Spouse name: Lamar   Number of children: 1   Years of education: Not on file   Highest education level: High school graduate  Occupational History   Occupation: retired  Tobacco Use   Smoking status: Former    Current packs/day: 0.00    Average packs/day: 0.3 packs/day for 2.4 years (0.6 ttl pk-yrs)    Types: Cigarettes    Start date: 1964    Quit date: 01/1965    Years since quitting: 59.6   Smokeless tobacco: Never  Vaping Use   Vaping status: Never Used  Substance and Sexual Activity   Alcohol use: No    Alcohol/week: 0.0 standard drinks of alcohol   Drug use: No   Sexual activity: Never  Other Topics Concern   Not on file  Social History Narrative   Not on file   Social Drivers of Health  Tobacco Use: Medium Risk (07/30/2024)   Patient History    Smoking Tobacco Use: Former    Smokeless Tobacco Use: Never    Passive Exposure: Not on file  Financial Resource Strain: Low Risk (04/27/2024)   Overall Financial Resource Strain (CARDIA)    Difficulty of Paying Living Expenses: Not hard at all  Food Insecurity: No Food Insecurity (04/27/2024)   Epic    Worried About Programme Researcher, Broadcasting/film/video in the Last Year: Never true    Ran Out of Food in the Last Year: Never true  Transportation Needs: No Transportation Needs (04/27/2024)   Epic    Lack of Transportation (Medical): No    Lack of Transportation (Non-Medical): No  Physical Activity: Insufficiently Active  (04/27/2024)   Exercise Vital Sign    Days of Exercise per Week: 7 days    Minutes of Exercise per Session: 20 min  Stress: No Stress Concern Present (04/27/2024)   [employer] of Occupational Health - Occupational Stress Questionnaire    Feeling of Stress: Only a little  Social Connections: Socially Integrated (04/27/2024)   Social Connection and Isolation Panel    Frequency of Communication with Friends and Family: More than three times a week    Frequency of Social Gatherings with Friends and Family: More than three times a week    Attends Religious Services: More than 4 times per year    Active Member of Clubs or Organizations: Yes    Attends Banker Meetings: More than 4 times per year    Marital Status: Married  Depression (PHQ2-9): Low Risk (04/27/2024)   Depression (PHQ2-9)    PHQ-2 Score: 0  Alcohol Screen: Low Risk (04/27/2024)   Alcohol Screen    Last Alcohol Screening Score (AUDIT): 0  Housing: Low Risk (04/27/2024)   Epic    Unable to Pay for Housing in the Last Year: No    Number of Times Moved in the Last Year: 0    Homeless in the Last Year: No  Utilities: Not At Risk (04/27/2024)   Epic    Threatened with loss of utilities: No  Health Literacy: Adequate Health Literacy (04/27/2024)   B1300 Health Literacy    Frequency of need for help with medical instructions: Never     Family History:  The patient's family history includes Coronary artery disease in her mother; Diabetes in her brother; Glaucoma in her mother; Hypertension in her maternal grandfather and mother; Stroke in her father; Thyroid  disease in her mother. There is no history of Breast cancer.  ROS:   12-point review of systems is negative unless otherwise noted in the HPI.  EKGs/Other Studies Reviewed:    Studies reviewed were summarized above. The additional studies were reviewed today:  12/2022 Long term monitor HR 37 - 193 bpm, average 61 bpm. 126 nonsustained SVT, longest 19.4  seconds. Rare supraventricular and ventricular ectopy. No sustained arrhythmias. No atrial fibrillation.   04/2020 Echo complete 1. Left ventricular ejection fraction, by estimation, is 55 to 60%. The  left ventricle has normal function. The left ventricle has no regional  wall motion abnormalities. Left ventricular diastolic parameters are  consistent with Grade II diastolic  dysfunction (pseudonormalization).   2. Right ventricular systolic function is normal. The right ventricular  size is normal. There is moderately elevated pulmonary artery systolic  pressure.   3. Left atrial size was mildly dilated.   4. The mitral valve is normal in structure. No evidence of mitral valve  regurgitation. No  evidence of mitral stenosis.   5. The aortic valve is normal in structure. Aortic valve regurgitation is  not visualized. No aortic stenosis is present.   6. The inferior vena cava is normal in size with greater than 50%  respiratory variability, suggesting right atrial pressure of 3 mmHg.    02/2020 Long term monitor Patient had a min HR of 38 bpm, max HR of 187 bpm, and avg HR of 63 bpm. 1 run of Ventricular Tachycardia occurred lasting 6 beats with a max rate of 129 bpm (avg 122 bpm). Occasional Supraventricular Tachycardia runs occurred. Isolated SVEs were occasional (3.1%, [id number]), SVE Couplets were rare (<1.0%, 562), and SVE Triplets were rare (<1.0%, 120).  EKG:  EKG personally reviewed by me today    PHYSICAL EXAM:    VS:  LMP  (LMP Unknown)   BMI: There is no height or weight on file to calculate BMI.  GEN: Well nourished, well developed in no acute distress NECK: No JVD; No carotid bruits CARDIAC: ***RRR, no murmurs, rubs, gallops RESPIRATORY:  Clear to auscultation without rales, wheezing or rhonchi  ABDOMEN: Soft, non-tender, non-distended EXTREMITIES:  *** No edema; No deformity  Wt Readings from Last 3 Encounters:  07/30/24 148 lb (67.1 kg)  07/21/24 148 lb 6 oz (67.3  kg)  07/19/24 148 lb (67.1 kg)                  ASSESSMENT & PLAN:   New onset atrial fibrillation   Bradycardia   Hypertension   Hyperlipidemia   Hx stroke    {Are you ordering a CV Procedure (e.g. stress test, cath, DCCV, TEE, etc)?   Press F2        :[id number]}   Disposition: F/u with Dr. Anner or an APP in ***.   Medication Adjustments/Labs and Tests Ordered: Current medicines are reviewed at length with the patient today.  Concerns regarding medicines are outlined above. Medication changes, Labs and Tests ordered today are summarized above and listed in the Patient Instructions accessible in Encounters.   Bonney Lesley Maffucci, PA-C 08/11/2024 1:15 PM     [facility] [place] [address] [place], [place] [address] [phone]      [1]  No outpatient medications have been marked as taking for the 08/13/24 encounter (Appointment) with Maffucci Lesley CROME, PA-C.   Current Facility-Administered Medications for the 08/13/24 encounter (Appointment) with Maffucci Lesley CROME, PA-C  Medication   cyanocobalamin  (VITAMIN B12) injection 1,000 mcg   "

## 2024-08-11 NOTE — Progress Notes (Signed)
 Patient seen in nurse visit today for application of a Zio Heart monitor. Patient received the box via mail after providers order was processed. Patient instructed on use, symptom logging using app or log book and trouble shooting techniques. Patient aware if they need assistance with removal or sending back to please contact our office for additional assistance.   Zio Heart Monitor device is successfully placed on patients chest and tolerated well. Patient was given the opportunity to ask questions and provided answers if needed.

## 2024-08-11 NOTE — Patient Instructions (Signed)
 Your physician has recommended that you wear a Zio monitor.    This monitor is a medical device that records the hearts electrical activity. Doctors most often use these monitors to diagnose arrhythmias. Arrhythmias are problems with the speed or rhythm of the  heartbeat. The monitor  is a small device applied to your chest. You can wear one while you do your normal daily activities. While wearing this monitor if you have any symptoms to push the button and record what you felt. Once you have worn this monitor for the period of time provider prescribed (Usually 14 days), you will return the monitor device in the postage paid box. Once it is returned they will download the data collected and provide us  with a report which the provider will then review and we will call you with those results. Important tips:   Avoid showering during the first 24 hours of wearing the monitor. Avoid excessive sweating to help maximize wear time. Do not submerge the device, no hot tubs, and no swimming pools. Keep any lotions or oils away from the patch. After 24 hours you may shower with the patch on. Take brief showers with your back facing the shower head.  Do not remove patch once it has been placed because that will interrupt data and decrease adhesive wear time. Push the button when you have any symptoms and write down what you were feeling. Once you have completed wearing your monitor, remove and place into box which has postage paid and place in your outgoing mailbox.  If for some reason you have misplaced your box then call our office and we can provide another box and/or mail it off for you.

## 2024-08-12 ENCOUNTER — Other Ambulatory Visit: Payer: Self-pay | Admitting: Family Medicine

## 2024-08-13 ENCOUNTER — Telehealth: Payer: Self-pay | Admitting: *Deleted

## 2024-08-13 ENCOUNTER — Encounter: Payer: Self-pay | Admitting: Physician Assistant

## 2024-08-13 ENCOUNTER — Ambulatory Visit: Attending: Physician Assistant | Admitting: Physician Assistant

## 2024-08-13 VITALS — BP 120/80 | HR 115 | Ht 62.0 in | Wt 147.0 lb

## 2024-08-13 DIAGNOSIS — I4819 Other persistent atrial fibrillation: Secondary | ICD-10-CM | POA: Diagnosis not present

## 2024-08-13 DIAGNOSIS — R001 Bradycardia, unspecified: Secondary | ICD-10-CM | POA: Diagnosis not present

## 2024-08-13 DIAGNOSIS — I10 Essential (primary) hypertension: Secondary | ICD-10-CM

## 2024-08-13 DIAGNOSIS — E782 Mixed hyperlipidemia: Secondary | ICD-10-CM

## 2024-08-13 DIAGNOSIS — Z8673 Personal history of transient ischemic attack (TIA), and cerebral infarction without residual deficits: Secondary | ICD-10-CM

## 2024-08-13 MED ORDER — DABIGATRAN ETEXILATE MESYLATE 150 MG PO CAPS: 150.0000 mg | ORAL_CAPSULE | Freq: Two times a day (BID) | ORAL | 3 refills | Status: AC

## 2024-08-13 NOTE — Patient Instructions (Addendum)
 Medication Instructions:  Your physician recommends the following medication changes.  STOP TAKING: ELIQUIS   START TAKING: PRADAXA 150 MG 2 TIMES DAILY   *If you need a refill on your cardiac medications before your next appointment, please call your pharmacy*  Lab Work: Your provider would like for you to have following labs drawn today CBC AND BMET.   If you have labs (blood work) drawn today and your tests are completely normal, you will receive your results only by: MyChart Message (if you have MyChart) OR A paper copy in the mail If you have any lab test that is abnormal or we need to change your treatment, we will call you to review the results.  Testing/Procedures:     Dear Teresa [name]  You are scheduled for a Cardioversion on Friday, January 9 with Dr. ARGENTINA.  Please arrive at the [facility] Entrance of [facility], [address], [place] [address] at 11:30 AM (This is 1 hour(s) prior to your procedure time).  Proceed to the Check-In Desk directly inside the entrance.  Procedure Parking: Use the entrance off of the [address] side of the hospital. Turn right upon entering and follow the driveway to parking that is directly in front of the [facility] Vascular Center. There is no valet parking available at this entrance, however there is an awning directly in front of the [facility] Vascular Center for drop off/ pick up for patients.    DIET:  Nothing to eat or drink after midnight except a sip of water with medications (see medication instructions below)  MEDICATION INSTRUCTIONS: !!IF ANY NEW MEDICATIONS ARE STARTED AFTER TODAY, PLEASE NOTIFY YOUR PROVIDER AS SOON AS POSSIBLE!!  FYI: Medications such as Semaglutide (Ozempic, Wegovy), Tirzepatide (Mounjaro, Zepbound), Dulaglutide (Trulicity), etc (GLP1 agonists) AND Canagliflozin (Invokana), Dapagliflozin (Farxiga), Empagliflozin (Jardiance), Ertugliflozin (Steglatro), Bexagliflozin [employer]) or any  combination with one of these drugs such as Invokamet (Canagliflozin/Metformin ), Synjardy (Empagliflozin/Metformin ), etc (SGLT2 inhibitors) must be held around the time of a procedure. This is not a comprehensive list of all of these drugs. Please review all of your medications and talk to your provider if you take any one of these. If you are not sure, ask your provider.  {        :1   Continue taking your anticoagulant (blood thinner): PRADAXA You will need to continue this after your procedure until you are told by your provider that it is safe to stop.      FYI:  For your safety, and to allow us  to monitor your vital signs accurately during the surgery/procedure we request: If you have artificial nails, gel coating, SNS etc, please have those removed prior to your surgery/procedure. Not having the nail coverings /polish removed may result in cancellation or delay of your surgery/procedure.  Your support person will be asked to wait in the waiting room during your procedure.  It is OK to have someone drop you off and come back when you are ready to be discharged.  You cannot drive after the procedure and will need someone to drive you home.  Bring your insurance cards.  *Special Note: Every effort is made to have your procedure done on time. Occasionally there are emergencies that occur at the hospital that may cause delays. Please be patient if a delay does occur.      Follow-Up: At [facility], you and your health needs are our priority.  As part of our continuing mission to  provide you with exceptional heart care, our providers are all part of one team.  This team includes your primary Cardiologist (physician) and Advanced Practice Providers or APPs (Physician Assistants and Nurse Practitioners) who all work together to provide you with the care you need, when you need it.  Your next appointment:   2 week(s)  Provider:   You may see Alm Clay, MD or one of the  following Advanced Practice Providers on your designated Care Team:   Lonni Meager, NP Lesley Maffucci, PA-C Bernardino Bring, PA-C Cadence Georgetown, PA-C Tylene Lunch, NP Barnie Hila, NP

## 2024-08-13 NOTE — Progress Notes (Signed)
" ° °  08/13/2024  Patient ID: Elveria FORBES Louder, female   DOB: [birth year], 79 y.o.   MRN: [id number]  Test claims reflect patient has $400 deductible that reset the 1st of the year.  Eliquis  5mg  BID would be $248/month until this is met, or dabigatran 150mg  BID would be $46/month until met.  Patient sees cardiology this morning, so I have sent them a message to make aware; and they will discuss with patient at her visit.  Channing DELENA Mealing, PharmD, DPLA    "

## 2024-08-13 NOTE — Telephone Encounter (Signed)
-----   Message from Lesley LITTIE Maffucci sent at 08/13/2024  3:36 PM EST ----- Good afternoon. This patient is scheduled for cardioversion with Dr. Argentina 1/13. Discussed the case with him and he would like her to hold metoprolol  for 2 days prior to procedure. Could you please call and give her these instructions? Thank you.  Teresa [name]

## 2024-08-13 NOTE — Telephone Encounter (Signed)
 Called pt at the request of Lesley Maffucci, PA-C; Dr. Argentina would like for pt to hold her beta blocker, Metoprolol  for 2 days prior to her Cardioversion that is scheduled for 08/20/2024.  Pt will take her last dose of Metoprolol  prior to the procedure on 08/17/2024.  Pt also asked about Eliquis .  I explained to the patient that she is to continue her anticoagulant and also to take it the morning of the procedure and she will continue it until her provider instructs her to stop taking.  Pt verbalized understanding and thanked me for the phone call.  Will send updated instruction to pt's MyChart.

## 2024-08-14 LAB — CBC
Hematocrit: 36.5 % (ref 34.0–46.6)
Hemoglobin: 11.9 g/dL (ref 11.1–15.9)
MCH: 31.5 pg (ref 26.6–33.0)
MCHC: 32.6 g/dL (ref 31.5–35.7)
MCV: 97 fL (ref 79–97)
Platelets: 296 x10E3/uL (ref 150–450)
RBC: 3.78 x10E6/uL (ref 3.77–5.28)
RDW: 12.1 % (ref 11.7–15.4)
WBC: 8.6 x10E3/uL (ref 3.4–10.8)

## 2024-08-14 LAB — BASIC METABOLIC PANEL WITH GFR
BUN/Creatinine Ratio: 17 (ref 12–28)
BUN: 23 mg/dL (ref 8–27)
CO2: 19 mmol/L — ABNORMAL LOW (ref 20–29)
Calcium: 9.7 mg/dL (ref 8.7–10.3)
Chloride: 110 mmol/L — ABNORMAL HIGH (ref 96–106)
Creatinine, Ser: 1.37 mg/dL — ABNORMAL HIGH (ref 0.57–1.00)
Glucose: 95 mg/dL (ref 70–99)
Potassium: 5.4 mmol/L — ABNORMAL HIGH (ref 3.5–5.2)
Sodium: 146 mmol/L — ABNORMAL HIGH (ref 134–144)
eGFR: 40 mL/min/1.73 — ABNORMAL LOW

## 2024-08-16 ENCOUNTER — Ambulatory Visit: Payer: Self-pay | Admitting: Physician Assistant

## 2024-08-16 ENCOUNTER — Other Ambulatory Visit: Payer: Self-pay | Admitting: Family Medicine

## 2024-08-16 DIAGNOSIS — Z79899 Other long term (current) drug therapy: Secondary | ICD-10-CM

## 2024-08-17 ENCOUNTER — Ambulatory Visit: Payer: Self-pay | Admitting: Physician Assistant

## 2024-08-17 ENCOUNTER — Other Ambulatory Visit
Admission: RE | Admit: 2024-08-17 | Discharge: 2024-08-17 | Disposition: A | Discharge status: Home / Self Care | Source: Ambulatory Visit | Attending: Physician Assistant | Admitting: Physician Assistant

## 2024-08-17 DIAGNOSIS — Z79899 Other long term (current) drug therapy: Secondary | ICD-10-CM | POA: Insufficient documentation

## 2024-08-17 LAB — BASIC METABOLIC PANEL WITH GFR
Anion gap: 11 (ref 5–15)
BUN: 17 mg/dL (ref 8–23)
CO2: 22 mmol/L (ref 22–32)
Calcium: 9.8 mg/dL (ref 8.9–10.3)
Chloride: 111 mmol/L (ref 98–111)
Creatinine, Ser: 0.98 mg/dL (ref 0.44–1.00)
GFR, Estimated: 59 mL/min — ABNORMAL LOW
Glucose, Bld: 150 mg/dL — ABNORMAL HIGH (ref 70–99)
Potassium: 4.7 mmol/L (ref 3.5–5.1)
Sodium: 144 mmol/L (ref 135–145)

## 2024-08-17 NOTE — Telephone Encounter (Signed)
 Requested Prescriptions  Refused Prescriptions Disp Refills   glucose blood (ONETOUCH VERIO) test strip [Pharmacy Med Name: ONETOUCH VERIO TES] 100 each 0    Sig: USE AS DIRECTED     Endocrinology: Diabetes - Testing Supplies Passed - 08/17/2024  1:26 PM      Passed - Valid encounter within last 12 months    Recent Outpatient Visits           2 weeks ago New onset atrial fibrillation (HCC)   [place] [facility] [place], Megan P, DO   4 weeks ago New onset atrial fibrillation [facility])    [place] [facility] [place], Megan P, DO   4 months ago Essential hypertension   [place] [facility] [place], Megan P, DO   9 months ago 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

## 2024-08-18 ENCOUNTER — Telehealth: Payer: Self-pay

## 2024-08-18 NOTE — Telephone Encounter (Signed)
 Called and spoke with the patient.  Informed the patient to hold her Metformin  and continue taking Pradaxa  including the day of the procedure.  Patient verbalized understanding with all questions and concerns addressed at this time.  Patient expressed gratitude for the call.

## 2024-08-18 NOTE — Telephone Encounter (Signed)
 Pt states no one has gone over instructions about her procedure and she's unaware of what medications to hold.

## 2024-08-18 NOTE — Telephone Encounter (Signed)
 Pt said she just received a text message to hold  metFORMIN  (GLUCOPHAGE -XR) 500 MG 24 hr tablet  metoprolol  succinate (TOPROL -XL) 25 MG 24 hr tablet  She is confused about what she is to hold. Please advise.

## 2024-08-18 NOTE — Telephone Encounter (Signed)
 Returned pt's call;identity verified with 2 identifiers; confirmed that pt should hold Metformin  (Glucophage -XR) 500 mg the morning of the procedure and Metoprolol  Succinate (TOPROL -XL) 25 mg Hold on 1/8 and 1/9.  All questions answered; pt verballized understanding.

## 2024-08-20 ENCOUNTER — Ambulatory Visit: Admitting: Anesthesiology

## 2024-08-20 ENCOUNTER — Ambulatory Visit: Admission: RE | Admit: 2024-08-20 | Discharge: 2024-08-20 | Disposition: A | Discharge status: Home / Self Care

## 2024-08-20 ENCOUNTER — Encounter: Admission: RE | Disposition: A | Discharge status: Home / Self Care | Payer: Self-pay | Source: Home / Self Care

## 2024-08-20 DIAGNOSIS — Z87891 Personal history of nicotine dependence: Secondary | ICD-10-CM | POA: Diagnosis not present

## 2024-08-20 DIAGNOSIS — I10 Essential (primary) hypertension: Secondary | ICD-10-CM | POA: Diagnosis not present

## 2024-08-20 DIAGNOSIS — Z8673 Personal history of transient ischemic attack (TIA), and cerebral infarction without residual deficits: Secondary | ICD-10-CM | POA: Diagnosis not present

## 2024-08-20 DIAGNOSIS — Z7984 Long term (current) use of oral hypoglycemic drugs: Secondary | ICD-10-CM | POA: Diagnosis not present

## 2024-08-20 DIAGNOSIS — E119 Type 2 diabetes mellitus without complications: Secondary | ICD-10-CM | POA: Insufficient documentation

## 2024-08-20 DIAGNOSIS — Z79899 Other long term (current) drug therapy: Secondary | ICD-10-CM | POA: Insufficient documentation

## 2024-08-20 DIAGNOSIS — I4891 Unspecified atrial fibrillation: Secondary | ICD-10-CM | POA: Diagnosis not present

## 2024-08-20 DIAGNOSIS — I4819 Other persistent atrial fibrillation: Secondary | ICD-10-CM

## 2024-08-20 HISTORY — PX: CARDIOVERSION: SHX1299

## 2024-08-20 LAB — GLUCOSE, CAPILLARY: Glucose-Capillary: 119 mg/dL — ABNORMAL HIGH (ref 70–99)

## 2024-08-20 SURGERY — CARDIOVERSION
Anesthesia: General

## 2024-08-20 MED ORDER — METOPROLOL SUCCINATE ER 25 MG PO TB24: 25.0000 mg | ORAL_TABLET | Freq: Every day | ORAL | 3 refills | Status: AC

## 2024-08-20 MED ORDER — SODIUM CHLORIDE 0.9 % IV SOLN: INTRAVENOUS | Status: DC

## 2024-08-20 MED ORDER — PROPOFOL 10 MG/ML IV BOLUS
INTRAVENOUS | Status: DC | PRN
  Administered 2024-08-20: 60 mg via INTRAVENOUS

## 2024-08-20 MED ORDER — PROPOFOL 10 MG/ML IV BOLUS
INTRAVENOUS | Status: AC
  Filled 2024-08-20: qty 20

## 2024-08-20 NOTE — Anesthesia Postprocedure Evaluation (Signed)
"   Anesthesia Post Note  Patient: Teresa [name]  Procedure(s) Performed: CARDIOVERSION  Patient location during evaluation: PACU Anesthesia Type: General Level of consciousness: awake Pain management: pain level controlled Vital Signs Assessment: post-procedure vital signs reviewed and stable Respiratory status: spontaneous breathing Cardiovascular status: stable Anesthetic complications: no   No notable events documented.   Last Vitals:  Vitals:   08/20/24 1315 08/20/24 1330  BP: (!) 121/56 127/65  Pulse: 71 64  Resp: (!) 26 18  Temp:    SpO2: 99% 99%    Last Pain:  Vitals:   08/20/24 1330  TempSrc:   PainSc: 0-No pain                 VAN STAVEREN,Kendyn Zaman      "

## 2024-08-20 NOTE — Anesthesia Preprocedure Evaluation (Signed)
"                                    Anesthesia Evaluation  Patient identified by MRN, date of birth, ID band Patient awake    Reviewed: Allergy & Precautions, NPO status , Patient's Chart, lab work & pertinent test results  Airway Mallampati: II  TM Distance: >3 FB Neck ROM: Full    Dental  (+) Chipped,    Pulmonary neg pulmonary ROS, Patient abstained from smoking., former smoker   Pulmonary exam normal  + decreased breath sounds      Cardiovascular Exercise Tolerance: Good hypertension, Pt. on medications negative cardio ROS Normal cardiovascular exam+ dysrhythmias Atrial Fibrillation  Rhythm:Regular Rate:Tachycardia     Neuro/Psych CVA negative neurological ROS  negative psych ROS   GI/Hepatic negative GI ROS, Neg liver ROS,,,  Endo/Other  negative endocrine ROSdiabetes, Type 2, Oral Hypoglycemic AgentsHypothyroidism    Renal/GU negative Renal ROS  negative genitourinary   Musculoskeletal   Abdominal Normal abdominal exam  (+)   Peds negative pediatric ROS (+)  Hematology negative hematology ROS (+) Blood dyscrasia, anemia   Anesthesia Other Findings Past Medical History: No date: Diabetes mellitus without complication (HCC) No date: Heart murmur No date: Hyperlipidemia No date: Hypertension No date: Hypothyroidism No date: Lichen sclerosus No date: Obesity No date: Osteopenia No date: Stroke [facility]) No date: Thyroid  disease  Past Surgical History: No date: carpel tunnel 08/30/2020: COLONOSCOPY WITH PROPOFOL ; N/A     Comment:  Procedure: COLONOSCOPY WITH PROPOFOL ;  Surgeon:               Janalyn Keene NOVAK, MD;  Location: [facility] ENDOSCOPY;                Service: Gastroenterology;  Laterality: N/A; No date: ROTATOR CUFF REPAIR; Left No date: 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     Reproductive/Obstetrics negative OB ROS                              Anesthesia Physical Anesthesia Plan  ASA: 3  Anesthesia Plan:  General   Post-op Pain Management:    Induction: Intravenous  PONV Risk Score and Plan: Propofol  infusion and TIVA  Airway Management Planned: Natural Airway and Nasal Cannula  Additional Equipment:   Intra-op Plan:   Post-operative Plan:   Informed Consent: I have reviewed the patients History and Physical, chart, labs and discussed the procedure including the risks, benefits and alternatives for the proposed anesthesia with the patient or authorized representative who has indicated his/her understanding and acceptance.     Dental Advisory Given  Plan Discussed with: CRNA  Anesthesia Plan Comments:         Anesthesia Quick Evaluation  "

## 2024-08-20 NOTE — Transfer of Care (Signed)
 Immediate Anesthesia Transfer of Care Note  Patient: Teresa [name]  Procedure(s) Performed: Procedures: CARDIOVERSION (N/A)  Patient Location: PACU and Short Stay  Anesthesia Type:General  Level of Consciousness: awake, alert  and oriented  Airway & Oxygen Therapy: Patient Spontanous Breathing and Patient connected to nasal cannula oxygen  Post-op Assessment: Report given to RN and Post -op Vital signs reviewed and stable  Post vital signs: Reviewed and stable  Last Vitals:  Vitals:   08/20/24 1258 08/20/24 1259  BP:  (!) 97/53  Pulse: 69   Resp: (!) 28 16  Temp:    SpO2: 99% 99%    Complications: No apparent anesthesia complications

## 2024-08-20 NOTE — CV Procedure (Signed)
" ° °  DIRECT CURRENT CARDIOVERSION  NAME:  Teresa [name]    MRN: [id number] DOB:  03-Nov-1945    ADMIT DATE: 08/20/2024  INDICATIONS: Symptomatic atrial fibrillation  PROCEDURE:   Informed consent was obtained prior to the procedure. The risks, benefits and alternatives for the procedure were discussed and the patient comprehended these risks.  Risks include, but are not limited to, vomiting, nausea, somnolence, bleeding, low blood pressure, aspiration, pneumonia, infection, and death.  After a procedural time-out, the patient was sedated by the anesthesia service. Once an appropriate level of sedation was confirmed, the patient was cardioverted x 1 with 200J of biphasic synchronized energy.  The patient converted to sinus bradycardia with PACs.  There were no apparent complications.  The patient had normal neuro status and respiratory status post procedure with vitals stable as recorded elsewhere.  Adequate airway was maintained throughout and vital signs monitored per protocol.  COMPLICATIONS:    Complications: No complications Patient tolerated procedure well. Given mild bradycardia, will put her on half-dose of prior metoprolol , metoprolol  25mg  XL every day.  Signed, Caron Poser, MD      "

## 2024-08-20 NOTE — Anesthesia Procedure Notes (Signed)
 Date/Time: 08/20/2024 12:48 PM  Performed by: Tod Handing, CRNAPre-anesthesia Checklist: Patient identified, Emergency Drugs available, Suction available and Patient being monitored Patient Re-evaluated:Patient Re-evaluated prior to induction Oxygen Delivery Method: Nasal cannula Induction Type: IV induction Dental Injury: Teeth and Oropharynx as per pre-operative assessment  Comments: Nasal cannula with etCO2 monitoring

## 2024-08-20 NOTE — H&P (Signed)
 History and Physical Interval Note:  08/20/2024 11:18 AM  Teresa [name]  has presented today for procedure, with the diagnosis of persistent atrial fibrillation.  The various methods of treatment have been discussed with the patient and family. After consideration of risks, benefits and other options for treatment, the patient has consented to  Procedures: CARDIOVERSION (N/A) as a therapeutic intervention.  The patient's history has been reviewed, patient examined, no change in status, stable for procedure.  I have reviewed the patient's chart and labs.  Questions were answered to the patient's satisfaction.     Caron [facility]

## 2024-08-24 ENCOUNTER — Ambulatory Visit

## 2024-08-24 DIAGNOSIS — I4891 Unspecified atrial fibrillation: Secondary | ICD-10-CM

## 2024-08-24 LAB — ECHOCARDIOGRAM COMPLETE
AR max vel: 1.73 cm2
AV Area VTI: 1.76 cm2
AV Area mean vel: 1.81 cm2
AV Mean grad: 4 mmHg
AV Peak grad: 7.8 mmHg
Ao pk vel: 1.4 m/s
Area-P 1/2: 3.99 cm2
Calc EF: 49.9 %
S' Lateral: 3 cm
Single Plane A2C EF: 52.1 %
Single Plane A4C EF: 50.4 %

## 2024-08-25 ENCOUNTER — Ambulatory Visit: Payer: Self-pay | Admitting: Physician Assistant

## 2024-08-27 ENCOUNTER — Ambulatory Visit: Admitting: Physician Assistant

## 2024-08-29 DIAGNOSIS — I4891 Unspecified atrial fibrillation: Secondary | ICD-10-CM | POA: Diagnosis not present

## 2024-08-31 ENCOUNTER — Encounter: Payer: Self-pay | Admitting: Family Medicine

## 2024-08-31 ENCOUNTER — Ambulatory Visit: Admitting: Family Medicine

## 2024-08-31 VITALS — BP 150/70 | Temp 97.4°F | Resp 16 | Ht 62.01 in | Wt 152.0 lb

## 2024-08-31 DIAGNOSIS — I4719 Other supraventricular tachycardia: Secondary | ICD-10-CM | POA: Diagnosis not present

## 2024-08-31 DIAGNOSIS — R0981 Nasal congestion: Secondary | ICD-10-CM

## 2024-08-31 DIAGNOSIS — E538 Deficiency of other specified B group vitamins: Secondary | ICD-10-CM | POA: Diagnosis not present

## 2024-08-31 DIAGNOSIS — I10 Essential (primary) hypertension: Secondary | ICD-10-CM | POA: Diagnosis not present

## 2024-08-31 MED ORDER — FLUTICASONE PROPIONATE 50 MCG/ACT NA SUSP: 2.0000 | Freq: Every day | NASAL | 6 refills | Status: AC

## 2024-08-31 MED ORDER — LISINOPRIL 30 MG PO TABS: 30.0000 mg | ORAL_TABLET | Freq: Every day | ORAL | 0 refills | Status: AC

## 2024-08-31 NOTE — Assessment & Plan Note (Signed)
 B12 shot given today

## 2024-08-31 NOTE — Progress Notes (Signed)
 "  BP (!) 150/70   Temp (!) 97.4 F (36.3 C) (Oral)   Resp 16   Ht 5' 2.01 (1.575 m)   Wt 152 lb (68.9 kg)   LMP  (LMP Unknown)   SpO2 100%   BMI 27.79 kg/m    Subjective:    Patient ID: Teresa [name], female    DOB: 06/14/1946, 79 y.o.   MRN: [id number]  HPI: Teresa [name] is a 79 y.o. female  Chief Complaint  Patient presents with   Follow-up    Follow up after having heart shocked, states that she feels like a different person   Had her cadioversion done about 10 days ago. Feeling back to herself. Has not been feeling tired. No palpitations  HYPERTENSION  Hypertension status: uncontrolled  Satisfied with current treatment? yes Duration of hypertension: chronic BP monitoring frequency:  rarely BP medication side effects:  no Medication compliance: excellent compliance Previous BP meds:lisinopril , metoprolol  Aspirin: no Recurrent headaches: no Visual changes: no Palpitations: no Dyspnea: no Chest pain: no Lower extremity edema: no Dizzy/lightheaded: no  UPPER RESPIRATORY TRACT INFECTION Duration: about 2 weeks Worst symptom: cough, congestion Fever: no Cough: yes Shortness of breath: no Wheezing: no Chest pain: no Chest tightness: no Chest congestion: no Nasal congestion: yes Runny nose: yes Post nasal drip: yes Sneezing: no Sore throat: no Swollen glands: no Sinus pressure: no Headache: no Face pain: no Toothache: no Ear pain: no  Ear pressure: no  Eyes red/itching:no Eye drainage/crusting: no  Vomiting: no Rash: no Fatigue: no Sick contacts: no Strep contacts: no  Context: better Recurrent sinusitis: no Relief with OTC cold/cough medications: no  Treatments attempted: none   Relevant past medical, surgical, family and social history reviewed and updated as indicated. Interim medical history since our last visit reviewed. Allergies and medications reviewed and updated.  Review of Systems  Constitutional: Negative.   HENT:   Positive for congestion. Negative for dental problem, drooling, ear discharge, ear pain, facial swelling, hearing loss, mouth sores, nosebleeds, postnasal drip, rhinorrhea, sinus pressure, sinus pain, sneezing, sore throat, tinnitus, trouble swallowing and voice change.   Respiratory:  Positive for cough. Negative for apnea, choking, chest tightness, shortness of breath, wheezing and stridor.   Cardiovascular: Negative.   Gastrointestinal: Negative.   Musculoskeletal: Negative.   Psychiatric/Behavioral: Negative.      Per HPI unless specifically indicated above     Objective:    BP (!) 150/70   Temp (!) 97.4 F (36.3 C) (Oral)   Resp 16   Ht 5' 2.01 (1.575 m)   Wt 152 lb (68.9 kg)   LMP  (LMP Unknown)   SpO2 100%   BMI 27.79 kg/m   Wt Readings from Last 3 Encounters:  08/31/24 152 lb (68.9 kg)  08/20/24 145 lb (65.8 kg)  08/13/24 147 lb (66.7 kg)    Physical Exam Vitals and nursing note reviewed.  Constitutional:      General: She is not in acute distress.    Appearance: Normal appearance. She is not ill-appearing, toxic-appearing or diaphoretic.  HENT:     Head: Normocephalic and atraumatic.     Right Ear: External ear normal.     Left Ear: External ear normal.     Nose: Nose normal.     Mouth/Throat:     Mouth: Mucous membranes are moist.     Pharynx: Oropharynx is clear.  Eyes:     General: No scleral icterus.       Right eye:  No discharge.        Left eye: No discharge.     Extraocular Movements: Extraocular movements intact.     Conjunctiva/sclera: Conjunctivae normal.     Pupils: Pupils are equal, round, and reactive to light.  Cardiovascular:     Rate and Rhythm: Normal rate and regular rhythm.     Pulses: Normal pulses.     Heart sounds: Normal heart sounds. No murmur heard.    No friction rub. No gallop.  Pulmonary:     Effort: Pulmonary effort is normal. No respiratory distress.     Breath sounds: Normal breath sounds. No stridor. No wheezing, rhonchi  or rales.  Chest:     Chest wall: No tenderness.  Musculoskeletal:        General: Normal range of motion.     Cervical back: Normal range of motion and neck supple.  Skin:    General: Skin is warm and dry.     Capillary Refill: Capillary refill takes less than 2 seconds.     Coloration: Skin is not jaundiced or pale.     Findings: No bruising, erythema, lesion or rash.  Neurological:     General: No focal deficit present.     Mental Status: She is alert and oriented to person, place, and time. Mental status is at baseline.  Psychiatric:        Mood and Affect: Mood normal.        Behavior: Behavior normal.        Thought Content: Thought content normal.        Judgment: Judgment normal.     Results for orders placed or performed in visit on 08/24/24  ECHOCARDIOGRAM COMPLETE   Collection Time: 08/24/24 10:10 AM  Result Value Ref Range   AR max vel 1.73 cm2   AV Peak grad 7.8 mmHg   Ao pk vel 1.40 m/s   S' Lateral 3.00 cm   Area-P 1/2 3.99 cm2   AV Area VTI 1.76 cm2   AV Mean grad 4.0 mmHg   Single Plane A4C EF 50.4 %   Single Plane A2C EF 52.1 %   Calc EF 49.9 %   AV Area mean vel 1.81 cm2   Est EF 60 - 65%       Assessment & Plan:   Problem List Items Addressed This Visit       Cardiovascular and Mediastinum   Essential hypertension - Primary (Chronic)   BP running high since she's been cardioverted. Will put her back on the 30mg  lisinopril  she was on before. Recheck 1 month. Call with any concerns.       Relevant Medications   lisinopril  (ZESTRIL ) 30 MG tablet   PAT (paroxysmal atrial tachycardia) (Chronic)   S/p cardioversion. NSR today. Feeling great. Continue to follow with cardiology.       Relevant Medications   lisinopril  (ZESTRIL ) 30 MG tablet     Other   B12 deficiency   B12 shot given today.       Other Visit Diagnoses       Nasal congestion       No sign of sinusitis. Will treat with flonase . Call if not getting better or getting worse.         Follow up plan: Return in about 5 weeks (around 10/05/2024).      "

## 2024-08-31 NOTE — Assessment & Plan Note (Signed)
 S/p cardioversion. NSR today. Feeling great. Continue to follow with cardiology.

## 2024-08-31 NOTE — Assessment & Plan Note (Signed)
 BP running high since she's been cardioverted. Will put her back on the 30mg  lisinopril  she was on before. Recheck 1 month. Call with any concerns.

## 2024-09-03 ENCOUNTER — Ambulatory Visit

## 2024-09-07 ENCOUNTER — Ambulatory Visit: Admitting: Physician Assistant

## 2024-09-10 ENCOUNTER — Ambulatory Visit: Attending: Physician Assistant | Admitting: Physician Assistant

## 2024-09-10 ENCOUNTER — Encounter: Payer: Self-pay | Admitting: Physician Assistant

## 2024-09-10 VITALS — BP 120/60 | HR 63 | Ht 62.0 in | Wt 153.4 lb

## 2024-09-10 DIAGNOSIS — R001 Bradycardia, unspecified: Secondary | ICD-10-CM

## 2024-09-10 DIAGNOSIS — I10 Essential (primary) hypertension: Secondary | ICD-10-CM

## 2024-09-10 DIAGNOSIS — Z8673 Personal history of transient ischemic attack (TIA), and cerebral infarction without residual deficits: Secondary | ICD-10-CM

## 2024-09-10 DIAGNOSIS — E782 Mixed hyperlipidemia: Secondary | ICD-10-CM

## 2024-09-10 DIAGNOSIS — I4819 Other persistent atrial fibrillation: Secondary | ICD-10-CM

## 2024-09-10 NOTE — Patient Instructions (Signed)
 Medication Instructions:  Your physician recommends that you continue on your current medications as directed. Please refer to the Current Medication list given to you today.  *If you need a refill on your cardiac medications before your next appointment, please call your pharmacy*  Lab Work: Your provider would like for you to have following labs drawn today CBC.   If you have labs (blood work) drawn today and your tests are completely normal, you will receive your results only by: MyChart Message (if you have MyChart) OR A paper copy in the mail If you have any lab test that is abnormal or we need to change your treatment, we will call you to review the results.  Testing/Procedures: No test ordered today   Follow-Up: At [facility], you and your health needs are our priority.  As part of our continuing mission to provide you with exceptional heart care, our providers are all part of one team.  This team includes your primary Cardiologist (physician) and Advanced Practice Providers or APPs (Physician Assistants and Nurse Practitioners) who all work together to provide you with the care you need, when you need it.  Your next appointment:   6 month(s)  Provider:   You may see Alm Clay, MD or one of the following Advanced Practice Providers on your designated Care Team:   Lonni Meager, NP Lesley Maffucci, PA-C Bernardino Bring, PA-C Cadence Tuscumbia, PA-C Tylene Lunch, NP Barnie Hila, NP    We recommend signing up for the patient portal called MyChart.  Sign up information is provided on this After Visit Summary.  MyChart is used to connect with patients for Virtual Visits (Telemedicine).  Patients are able to view lab/test results, encounter notes, upcoming appointments, etc.  Non-urgent messages can be sent to your provider as well.   To learn more about what you can do with MyChart, go to [email or web address].   Other Instructions

## 2024-09-10 NOTE — Progress Notes (Signed)
 "  Cardiology Office Note    Date:  09/10/2024   ID:  Teresa [name], DOB 08/18/1945, MRN [id number]  PCP:  Louder Duwaine SQUIBB, DO  Cardiologist:  Alm Clay, MD  Electrophysiologist:  None   Chief Complaint: Follow up  History of Present Illness:   Teresa [name] is a 78 y.o. female with history of persistent atrial fibrillation, paroxysmal atrial tachycardia, bradycardia, hypertension, hyperlipidemia, type 2 diabetes, CVA, hypothyroidism, and obesity who presents for follow up on atrial fibrillation.     Patient was evaluated by Dr. Mady 04/08/2018 after noted to be bradycardic with her PCP.  She reported a long history of being told she had low heart rate in the past.  She had appropriate chronotropic competence.  She was noted to have a systolic murmur and subsequent echo 04/2018 showed EF 60 to 65% with G2 DD and no significant valvular abnormalities.  Patient was evaluated by Dr. Darliss 02/2020 after referral by PCP for irregular heart rhythm.  EKG during visit showed normal sinus rhythm but on physical exam, heart rate was noted to be irregular.  Long-term monitor showed predominantly sinus rhythm with 1 run of NSVT lasting 6 beats, occasional SVT, and PACs at a 3.1% burden.  Echo 04/2020 showed EF 55 to 60%, G2 DD, moderately elevated pulmonary artery systolic pressure, and no significant valvular abnormalities.  Patient was seen by her PCP 07/19/2024 and reported anxiety with feeling of heart racing since that morning. She also noted shortness of breath, tiredness, and shakiness. EKG was obtained and revealed atrial fibrillation. She was started on metoprolol  succinate 25 mg daily and Eliquis  5 mg daily. She followed up with our office 07/21/2024 reported ongoing symptoms. She reported that she had not yet picked up metoprolol  from the pharmacy but had started taking Eliquis , as she received samples from her PCP.  Echo 08/24/2024 revealed EF 60 to 65% with no RWMA, indeterminate  diastolic parameters, moderately elevated pulmonary artery systolic pressure, mild MR, and moderate TR.  ZIO monitor 08/2024 revealed 100% burden of atrial fibrillation.    Patient was most recently seen in clinic by myself 08/13/2024 endorsing some fatigue but improvement in anxiety and heart racing.  She had been tracking her heart rate at home with rates ranging 80s to 120s bpm.  EKG revealed persistent atrial fibrillation.  She was not able to afford Eliquis  or Xarelto due to high co-pay.  She was transitioned to Pradaxa  150 mg twice daily.  She underwent successful DCCV 08/20/2024.  Metoprolol  was reduced to 25 mg daily.  Patient presents today overall feeling much better from a cardiac perspective.  She reports significant improvement in energy level since cardioversion.  She denies symptoms concerning for recurrence of atrial fibrillation including palpitations, lightheadedness, dizziness, and lower extremity swelling.  Maintaining sinus rhythm on EKG today.  Denies chest pain.  She has been compliant with Pradaxa  without significant bleeding issues.  She does endorse occasional nosebleeds in the morning which are not persistent.  Labs independently reviewed: 08/2024-Hgb 11.9, HCT 36.5, platelets 296, sodium 144, potassium 4.7, BUN 17, creatinine 0.98  Objective   Past Medical History:  Diagnosis Date   Diabetes mellitus without complication (HCC)    Heart murmur    Hyperlipidemia    Hypertension    Hypothyroidism    Lichen sclerosus    Obesity    Osteopenia    Stroke (HCC)    Thyroid  disease     Current Medications: Active Medications[1]  Allergies:  Amlodipine , Atorvastatin , and Evista [raloxifene]   Social History   Socioeconomic History   Marital status: Married    Spouse name: Lamar   Number of children: 1   Years of education: Not on file   Highest education level: High school graduate  Occupational History   Occupation: retired  Tobacco Use   Smoking status: Former     Current packs/day: 0.00    Average packs/day: 0.3 packs/day for 2.4 years (0.6 ttl pk-yrs)    Types: Cigarettes    Start date: 1964    Quit date: 01/1965    Years since quitting: 59.7   Smokeless tobacco: Never  Vaping Use   Vaping status: Never Used  Substance and Sexual Activity   Alcohol use: No    Alcohol/week: 0.0 standard drinks of alcohol   Drug use: No   Sexual activity: Never  Other Topics Concern   Not on file  Social History Narrative   Not on file   Social Drivers of Health   Tobacco Use: Medium Risk (09/10/2024)   Patient History    Smoking Tobacco Use: Former    Smokeless Tobacco Use: Never    Passive Exposure: Not on Actuary Strain: Low Risk (04/27/2024)   Overall Financial Resource Strain (CARDIA)    Difficulty of Paying Living Expenses: Not hard at all  Food Insecurity: No Food Insecurity (04/27/2024)   Epic    Worried About Programme Researcher, Broadcasting/film/video in the Last Year: Never true    Ran Out of Food in the Last Year: Never true  Transportation Needs: No Transportation Needs (04/27/2024)   Epic    Lack of Transportation (Medical): No    Lack of Transportation (Non-Medical): No  Physical Activity: Insufficiently Active (04/27/2024)   Exercise Vital Sign    Days of Exercise per Week: 7 days    Minutes of Exercise per Session: 20 min  Stress: No Stress Concern Present (04/27/2024)   [employer] of Occupational Health - Occupational Stress Questionnaire    Feeling of Stress: Only a little  Social Connections: Socially Integrated (04/27/2024)   Social Connection and Isolation Panel    Frequency of Communication with Friends and Family: More than three times a week    Frequency of Social Gatherings with Friends and Family: More than three times a week    Attends Religious Services: More than 4 times per year    Active Member of Clubs or Organizations: Yes    Attends Banker Meetings: More than 4 times per year    Marital Status:  Married  Depression (PHQ2-9): Low Risk (04/27/2024)   Depression (PHQ2-9)    PHQ-2 Score: 0  Alcohol Screen: Low Risk (04/27/2024)   Alcohol Screen    Last Alcohol Screening Score (AUDIT): 0  Housing: Low Risk (04/27/2024)   Epic    Unable to Pay for Housing in the Last Year: No    Number of Times Moved in the Last Year: 0    Homeless in the Last Year: No  Utilities: Not At Risk (04/27/2024)   Epic    Threatened with loss of utilities: No  Health Literacy: Adequate Health Literacy (04/27/2024)   B1300 Health Literacy    Frequency of need for help with medical instructions: Never     Family History:  The patient's family history includes Coronary artery disease in her mother; Diabetes in her brother; Glaucoma in her mother; Hypertension in her maternal grandfather and mother; Stroke in her father; Thyroid  disease  in her mother. There is no history of Breast cancer.  ROS:   12-point review of systems is negative unless otherwise noted in the HPI.  EKGs/Other Studies Reviewed:    Studies reviewed were summarized above. The additional studies were reviewed today:  12/2022 Long term monitor HR 37 - 193 bpm, average 61 bpm. 126 nonsustained SVT, longest 19.4 seconds. Rare supraventricular and ventricular ectopy. No sustained arrhythmias. No atrial fibrillation.   04/2020 Echo complete 1. Left ventricular ejection fraction, by estimation, is 55 to 60%. The  left ventricle has normal function. The left ventricle has no regional  wall motion abnormalities. Left ventricular diastolic parameters are  consistent with Grade II diastolic  dysfunction (pseudonormalization).   2. Right ventricular systolic function is normal. The right ventricular  size is normal. There is moderately elevated pulmonary artery systolic  pressure.   3. Left atrial size was mildly dilated.   4. The mitral valve is normal in structure. No evidence of mitral valve  regurgitation. No evidence of mitral stenosis.    5. The aortic valve is normal in structure. Aortic valve regurgitation is  not visualized. No aortic stenosis is present.   6. The inferior vena cava is normal in size with greater than 50%  respiratory variability, suggesting right atrial pressure of 3 mmHg.    02/2020 Long term monitor Patient had a min HR of 38 bpm, max HR of 187 bpm, and avg HR of 63 bpm. 1 run of Ventricular Tachycardia occurred lasting 6 beats with a max rate of 129 bpm (avg 122 bpm). Occasional Supraventricular Tachycardia runs occurred. Isolated SVEs were occasional (3.1%, [id number]), SVE Couplets were rare (<1.0%, 562), and SVE Triplets were rare (<1.0%, 120).  EKG:  EKG personally reviewed by me today EKG Interpretation Date/Time:  Friday September 10 2024 09:19:13 EST Ventricular Rate:  66 PR Interval:  154 QRS Duration:  74 QT Interval:  466 QTC Calculation: 488 R Axis:   -18  Text Interpretation: Normal sinus rhythm Confirmed by Lorene Sinclair ([id number]) on 09/10/2024 9:22:59 AM  PHYSICAL EXAM:    VS:  BP 120/60 (BP Location: Left Arm, Patient Position: Sitting, Cuff Size: Normal)   Pulse 63   Ht 5' 2 (1.575 m)   Wt 153 lb 6.4 oz (69.6 kg)   LMP  (LMP Unknown)   SpO2 99%   BMI 28.06 kg/m   BMI: Body mass index is 28.06 kg/m.  GEN: Well nourished, well developed in no acute distress NECK: No JVD; No carotid bruits CARDIAC: RRR, no murmurs, rubs, gallops RESPIRATORY:  Clear to auscultation without rales, wheezing or rhonchi  ABDOMEN: Soft, non-tender, non-distended EXTREMITIES: No edema; No deformity  Wt Readings from Last 3 Encounters:  09/10/24 153 lb 6.4 oz (69.6 kg)  08/31/24 152 lb (68.9 kg)  08/20/24 145 lb (65.8 kg)                  ASSESSMENT & PLAN:   Persistent atrial fibrillation - Initially diagnosed 07/19/2024.  Underwent successful DCCV 08/20/2024 with significant improvement in symptoms.  Maintaining sinus rhythm on EKG today.  Could not afford Eliquis  or Xarelto.  Continue  Pradaxa  150 mg twice daily and metoprolol  succinate 25 mg daily.  Endorses intermittent nosebleeds since starting Pradaxa  which are overall not bothersome.  Update CBC.  Bradycardia - Long history of bradycardia  She continues to closely monitor heart rate at home with readings in the 40s to 60s bpm.  Appropriate chronotropic competence.  Continue metoprolol  above.  Hypertension - BP well-controlled on current regimen.  Hyperlipidemia - Most recent lipid panel 03/2024 with LDL 58, at goal.  Continue rosuvastatin .  Hx stroke - Continue statin therapy and Pradaxa .   Disposition: Check CBC. F/u with Dr. Anner or an APP in 6 months or sooner as needed.   Medication Adjustments/Labs and Tests Ordered: Current medicines are reviewed at length with the patient today.  Concerns regarding medicines are outlined above. Medication changes, Labs and Tests ordered today are summarized above and listed in the Patient Instructions accessible in Encounters.   Bonney Lesley Maffucci, PA-C 09/10/2024 9:33 AM     [facility] [place] [address] [place], [place] [address] [phone]      [1]  Current Meds  Medication Sig   Accu-Chek Softclix Lancets lancets by Other route. Use as instructed   acetaminophen  (TYLENOL ) 650 MG CR tablet Take 1,300 mg by mouth every 8 (eight) hours as needed for pain (back pain).   Blood Glucose Monitoring Suppl (ACCU-CHEK GUIDE ME) w/Device KIT by Does not apply route.   chlorthalidone  (HYGROTON ) 25 MG tablet Take 1 tablet (25 mg total) by mouth daily.   clobetasol  ointment (TEMOVATE ) 0.05 % Apply topically 2 (two) times daily.   dabigatran  (PRADAXA ) 150 MG CAPS capsule Take 1 capsule (150 mg total) by mouth 2 (two) times daily.   ferrous gluconate (IRON  27) 240 (27 FE) MG tablet Take 240 mg by mouth daily.   folic acid  (FOLVITE ) 800 MCG tablet Take 800 mcg by mouth daily.   Glucose Blood (ACCU-CHEK GUIDE TEST VI) by In Vitro route.    Lancets Misc. (ACCU-CHEK SOFTCLIX LANCET DEV) KIT SMARTSIG:1 device As Directed   levothyroxine  (SYNTHROID ) 50 MCG tablet TAKE 1 TABLET DAILY   lisinopril  (ZESTRIL ) 30 MG tablet Take 1 tablet (30 mg total) by mouth daily.   metFORMIN  (GLUCOPHAGE -XR) 500 MG 24 hr tablet TAKE 2 TABLETS TWICE A DAY   metoprolol  succinate (TOPROL -XL) 25 MG 24 hr tablet Take 1 tablet (25 mg total) by mouth daily.   rosuvastatin  (CRESTOR ) 10 MG tablet Take 1 tablet (10 mg total) by mouth daily.   Current Facility-Administered Medications for the 09/10/24 encounter (Office Visit) with Maffucci Lesley CROME, PA-C  Medication   cyanocobalamin  (VITAMIN B12) injection 1,000 mcg   "

## 2024-09-11 LAB — CBC
Hematocrit: 28.5 % — ABNORMAL LOW (ref 34.0–46.6)
Hemoglobin: 9.1 g/dL — ABNORMAL LOW (ref 11.1–15.9)
MCH: 30.8 pg (ref 26.6–33.0)
MCHC: 31.9 g/dL (ref 31.5–35.7)
MCV: 97 fL (ref 79–97)
Platelets: 252 10*3/uL (ref 150–450)
RBC: 2.95 x10E6/uL — ABNORMAL LOW (ref 3.77–5.28)
RDW: 12 % (ref 11.7–15.4)
WBC: 7.8 10*3/uL (ref 3.4–10.8)

## 2024-09-13 ENCOUNTER — Other Ambulatory Visit: Payer: Self-pay | Admitting: Family Medicine

## 2024-09-13 ENCOUNTER — Ambulatory Visit: Payer: Self-pay | Admitting: Physician Assistant

## 2024-09-13 DIAGNOSIS — Z79899 Other long term (current) drug therapy: Secondary | ICD-10-CM

## 2024-09-13 DIAGNOSIS — Z1231 Encounter for screening mammogram for malignant neoplasm of breast: Secondary | ICD-10-CM

## 2024-09-13 NOTE — Telephone Encounter (Signed)
-----   Message from Lesley LITTIE Maffucci sent at 09/13/2024  5:04 PM EST ----- Blood work showed that hemoglobin has dropped from labs done 1 month ago. Please call patient to see if she is having any bleeding or hematochezia. She reported nose bleeds at prior visit, please  inquire regarding these as well. Recommend repeating a CBC this week.

## 2024-09-13 NOTE — Telephone Encounter (Signed)
 Called and left voicemail for the patient to call back. Call back number provided.

## 2024-09-15 NOTE — Addendum Note (Signed)
 Addended by: Jakiah Bienaime D on: 09/15/2024 08:45 AM   Modules accepted: Orders

## 2024-09-15 NOTE — Telephone Encounter (Signed)
 This encounter was created in error - please disregard.

## 2024-09-17 NOTE — Progress Notes (Signed)
 Last read by Elveria FORBES Louder at 6:59AM on 09/17/2024.

## 2024-10-04 ENCOUNTER — Encounter

## 2024-10-15 ENCOUNTER — Ambulatory Visit: Admitting: Family Medicine

## 2024-11-26 ENCOUNTER — Other Ambulatory Visit

## 2024-11-26 ENCOUNTER — Ambulatory Visit: Admitting: Oncology

## 2025-05-03 ENCOUNTER — Ambulatory Visit
